# Patient Record
Sex: Female | Born: 1945
Health system: Southern US, Community
[De-identification: ages and names within clinical notes are randomized; demographics above are authoritative.]

## PROBLEM LIST (undated history)

## (undated) DIAGNOSIS — E1169 Type 2 diabetes mellitus with other specified complication: Secondary | ICD-10-CM

## (undated) DIAGNOSIS — I5189 Other ill-defined heart diseases: Secondary | ICD-10-CM

## (undated) DIAGNOSIS — I2510 Atherosclerotic heart disease of native coronary artery without angina pectoris: Secondary | ICD-10-CM

## (undated) DIAGNOSIS — E669 Obesity, unspecified: Secondary | ICD-10-CM

## (undated) DIAGNOSIS — J9611 Chronic respiratory failure with hypoxia: Secondary | ICD-10-CM

## (undated) DIAGNOSIS — G4730 Sleep apnea, unspecified: Secondary | ICD-10-CM

## (undated) DIAGNOSIS — K219 Gastro-esophageal reflux disease without esophagitis: Secondary | ICD-10-CM

## (undated) DIAGNOSIS — M47816 Spondylosis without myelopathy or radiculopathy, lumbar region: Secondary | ICD-10-CM

## (undated) DIAGNOSIS — I890 Lymphedema, not elsewhere classified: Secondary | ICD-10-CM

## (undated) DIAGNOSIS — E785 Hyperlipidemia, unspecified: Secondary | ICD-10-CM

## (undated) DIAGNOSIS — I471 Supraventricular tachycardia, unspecified: Secondary | ICD-10-CM

## (undated) DIAGNOSIS — E039 Hypothyroidism, unspecified: Secondary | ICD-10-CM

## (undated) DIAGNOSIS — M47817 Spondylosis without myelopathy or radiculopathy, lumbosacral region: Secondary | ICD-10-CM

## (undated) DIAGNOSIS — I10 Essential (primary) hypertension: Secondary | ICD-10-CM

## (undated) DIAGNOSIS — M1710 Unilateral primary osteoarthritis, unspecified knee: Secondary | ICD-10-CM

## (undated) HISTORY — DX: Chronic respiratory failure with hypoxia: J96.11

## (undated) HISTORY — DX: Obesity, unspecified: E66.9

## (undated) HISTORY — DX: Essential (primary) hypertension: I10

## (undated) HISTORY — DX: Spondylosis without myelopathy or radiculopathy, lumbosacral region: M47.817

## (undated) HISTORY — DX: Type 2 diabetes mellitus with other specified complication: E66.9

## (undated) HISTORY — DX: Hyperlipidemia, unspecified: E78.5

## (undated) HISTORY — DX: Other ill-defined heart diseases: I51.89

## (undated) HISTORY — DX: Supraventricular tachycardia: I47.1

## (undated) HISTORY — DX: Hypothyroidism, unspecified: E03.9

## (undated) HISTORY — DX: Atherosclerotic heart disease of native coronary artery without angina pectoris: I25.10

## (undated) HISTORY — DX: Spondylosis without myelopathy or radiculopathy, lumbar region: M47.816

## (undated) HISTORY — DX: Sleep apnea, unspecified: G47.30

## (undated) HISTORY — PX: CHOLECYSTECTOMY: SHX55

## (undated) HISTORY — DX: Unilateral primary osteoarthritis, unspecified knee: M17.10

## (undated) HISTORY — DX: Lymphedema, not elsewhere classified: I89.0

## (undated) HISTORY — DX: Supraventricular tachycardia, unspecified: I47.10

## (undated) HISTORY — DX: Obesity, unspecified: E11.69

## (undated) HISTORY — PX: TUBAL LIGATION: SHX77

## (undated) HISTORY — DX: Gastro-esophageal reflux disease without esophagitis: K21.9

## (undated) HISTORY — DX: Morbid (severe) obesity due to excess calories: E66.01

---

## 2000-08-24 ENCOUNTER — Emergency Department (HOSPITAL_COMMUNITY): Admission: EM | Admit: 2000-08-24 | Discharge: 2000-08-25 | Payer: Self-pay

## 2000-09-22 ENCOUNTER — Encounter: Payer: Self-pay | Admitting: Neurosurgery

## 2000-09-22 ENCOUNTER — Ambulatory Visit (HOSPITAL_COMMUNITY): Admission: RE | Admit: 2000-09-22 | Discharge: 2000-09-22 | Payer: Self-pay | Admitting: Neurosurgery

## 2003-02-25 ENCOUNTER — Emergency Department (HOSPITAL_COMMUNITY): Admission: EM | Admit: 2003-02-25 | Discharge: 2003-02-25 | Payer: Self-pay | Admitting: Emergency Medicine

## 2003-08-07 ENCOUNTER — Encounter: Payer: Self-pay | Admitting: *Deleted

## 2003-08-07 ENCOUNTER — Encounter: Admission: RE | Admit: 2003-08-07 | Discharge: 2003-08-07 | Payer: Self-pay | Admitting: Internal Medicine

## 2005-04-04 ENCOUNTER — Encounter: Admission: RE | Admit: 2005-04-04 | Discharge: 2005-04-04 | Payer: Self-pay | Admitting: Family Medicine

## 2005-04-21 ENCOUNTER — Other Ambulatory Visit: Admission: RE | Admit: 2005-04-21 | Discharge: 2005-04-21 | Payer: Self-pay | Admitting: Family Medicine

## 2005-12-14 ENCOUNTER — Encounter
Admission: RE | Admit: 2005-12-14 | Discharge: 2006-03-14 | Payer: Self-pay | Admitting: Physical Medicine and Rehabilitation

## 2006-01-16 ENCOUNTER — Ambulatory Visit: Payer: Self-pay | Admitting: Physical Medicine and Rehabilitation

## 2006-03-13 ENCOUNTER — Encounter
Admission: RE | Admit: 2006-03-13 | Discharge: 2006-04-11 | Payer: Self-pay | Admitting: Physical Medicine and Rehabilitation

## 2006-03-23 ENCOUNTER — Ambulatory Visit: Payer: Self-pay | Admitting: Physical Medicine and Rehabilitation

## 2006-03-23 ENCOUNTER — Encounter
Admission: RE | Admit: 2006-03-23 | Discharge: 2006-06-21 | Payer: Self-pay | Admitting: Physical Medicine and Rehabilitation

## 2006-04-12 ENCOUNTER — Encounter
Admission: RE | Admit: 2006-04-12 | Discharge: 2006-06-06 | Payer: Self-pay | Admitting: Physical Medicine and Rehabilitation

## 2006-05-18 ENCOUNTER — Ambulatory Visit: Payer: Self-pay | Admitting: Physical Medicine and Rehabilitation

## 2006-07-13 ENCOUNTER — Ambulatory Visit: Payer: Self-pay | Admitting: Physical Medicine and Rehabilitation

## 2006-07-13 ENCOUNTER — Encounter
Admission: RE | Admit: 2006-07-13 | Discharge: 2006-10-11 | Payer: Self-pay | Admitting: Physical Medicine and Rehabilitation

## 2006-09-12 ENCOUNTER — Ambulatory Visit: Payer: Self-pay | Admitting: Physical Medicine and Rehabilitation

## 2006-09-18 ENCOUNTER — Encounter
Admission: RE | Admit: 2006-09-18 | Discharge: 2006-10-31 | Payer: Self-pay | Admitting: Physical Medicine and Rehabilitation

## 2006-11-01 ENCOUNTER — Encounter
Admission: RE | Admit: 2006-11-01 | Discharge: 2006-11-20 | Payer: Self-pay | Admitting: Physical Medicine and Rehabilitation

## 2006-11-03 ENCOUNTER — Encounter
Admission: RE | Admit: 2006-11-03 | Discharge: 2007-02-01 | Payer: Self-pay | Admitting: Physical Medicine and Rehabilitation

## 2006-11-03 ENCOUNTER — Ambulatory Visit: Payer: Self-pay | Admitting: Physical Medicine and Rehabilitation

## 2006-11-10 ENCOUNTER — Encounter
Admission: RE | Admit: 2006-11-10 | Discharge: 2007-02-08 | Payer: Self-pay | Admitting: Physical Medicine & Rehabilitation

## 2006-11-10 ENCOUNTER — Encounter
Admission: RE | Admit: 2006-11-10 | Discharge: 2006-11-10 | Payer: Self-pay | Admitting: Physical Medicine and Rehabilitation

## 2006-11-13 ENCOUNTER — Ambulatory Visit: Payer: Self-pay | Admitting: Physical Medicine & Rehabilitation

## 2007-01-08 ENCOUNTER — Ambulatory Visit: Payer: Self-pay | Admitting: Physical Medicine and Rehabilitation

## 2007-01-31 ENCOUNTER — Encounter
Admission: RE | Admit: 2007-01-31 | Discharge: 2007-05-01 | Payer: Self-pay | Admitting: Physical Medicine and Rehabilitation

## 2007-03-02 ENCOUNTER — Ambulatory Visit: Payer: Self-pay | Admitting: Physical Medicine and Rehabilitation

## 2007-04-09 ENCOUNTER — Ambulatory Visit: Payer: Self-pay | Admitting: Physical Medicine and Rehabilitation

## 2007-05-02 ENCOUNTER — Encounter
Admission: RE | Admit: 2007-05-02 | Discharge: 2007-07-31 | Payer: Self-pay | Admitting: Physical Medicine and Rehabilitation

## 2007-06-06 ENCOUNTER — Ambulatory Visit: Payer: Self-pay | Admitting: Physical Medicine and Rehabilitation

## 2007-07-24 ENCOUNTER — Encounter
Admission: RE | Admit: 2007-07-24 | Discharge: 2007-09-07 | Payer: Self-pay | Admitting: Physical Medicine and Rehabilitation

## 2007-08-07 ENCOUNTER — Encounter
Admission: RE | Admit: 2007-08-07 | Discharge: 2007-11-05 | Payer: Self-pay | Admitting: Physical Medicine and Rehabilitation

## 2007-08-16 ENCOUNTER — Ambulatory Visit: Payer: Self-pay | Admitting: Physical Medicine and Rehabilitation

## 2007-10-12 ENCOUNTER — Ambulatory Visit: Payer: Self-pay | Admitting: Physical Medicine and Rehabilitation

## 2007-11-09 ENCOUNTER — Encounter
Admission: RE | Admit: 2007-11-09 | Discharge: 2008-02-07 | Payer: Self-pay | Admitting: Physical Medicine and Rehabilitation

## 2007-12-18 ENCOUNTER — Ambulatory Visit: Payer: Self-pay | Admitting: Physical Medicine and Rehabilitation

## 2008-01-18 LAB — CONVERTED CEMR LAB: Pap Smear: NORMAL

## 2008-02-07 ENCOUNTER — Encounter
Admission: RE | Admit: 2008-02-07 | Discharge: 2008-05-07 | Payer: Self-pay | Admitting: Physical Medicine and Rehabilitation

## 2008-02-20 ENCOUNTER — Ambulatory Visit: Payer: Self-pay | Admitting: Physical Medicine and Rehabilitation

## 2008-03-19 ENCOUNTER — Ambulatory Visit: Payer: Self-pay | Admitting: Physical Medicine and Rehabilitation

## 2008-04-16 ENCOUNTER — Ambulatory Visit: Payer: Self-pay | Admitting: Physical Medicine and Rehabilitation

## 2008-05-07 ENCOUNTER — Encounter
Admission: RE | Admit: 2008-05-07 | Discharge: 2008-05-14 | Payer: Self-pay | Admitting: Physical Medicine and Rehabilitation

## 2008-05-12 ENCOUNTER — Encounter: Admission: RE | Admit: 2008-05-12 | Discharge: 2008-05-12 | Payer: Self-pay | Admitting: Internal Medicine

## 2008-05-14 ENCOUNTER — Ambulatory Visit: Payer: Self-pay | Admitting: Physical Medicine and Rehabilitation

## 2008-07-04 ENCOUNTER — Ambulatory Visit: Payer: Self-pay | Admitting: Physical Medicine and Rehabilitation

## 2008-07-04 ENCOUNTER — Encounter
Admission: RE | Admit: 2008-07-04 | Discharge: 2008-07-04 | Payer: Self-pay | Admitting: Physical Medicine and Rehabilitation

## 2008-07-31 ENCOUNTER — Encounter
Admission: RE | Admit: 2008-07-31 | Discharge: 2008-10-29 | Payer: Self-pay | Admitting: Physical Medicine and Rehabilitation

## 2008-08-01 ENCOUNTER — Ambulatory Visit: Payer: Self-pay | Admitting: Physical Medicine and Rehabilitation

## 2008-09-02 ENCOUNTER — Ambulatory Visit: Payer: Self-pay | Admitting: Physical Medicine and Rehabilitation

## 2008-10-03 ENCOUNTER — Ambulatory Visit: Payer: Self-pay | Admitting: Physical Medicine and Rehabilitation

## 2008-11-03 ENCOUNTER — Encounter
Admission: RE | Admit: 2008-11-03 | Discharge: 2008-12-29 | Payer: Self-pay | Admitting: Physical Medicine and Rehabilitation

## 2008-11-04 ENCOUNTER — Ambulatory Visit: Payer: Self-pay | Admitting: Physical Medicine and Rehabilitation

## 2008-12-29 ENCOUNTER — Ambulatory Visit: Payer: Self-pay | Admitting: Physical Medicine and Rehabilitation

## 2009-01-29 ENCOUNTER — Encounter
Admission: RE | Admit: 2009-01-29 | Discharge: 2009-01-29 | Payer: Self-pay | Admitting: Physical Medicine & Rehabilitation

## 2009-02-04 ENCOUNTER — Ambulatory Visit: Payer: Self-pay | Admitting: Physical Medicine and Rehabilitation

## 2009-02-04 ENCOUNTER — Encounter
Admission: RE | Admit: 2009-02-04 | Discharge: 2009-05-05 | Payer: Self-pay | Admitting: Physical Medicine and Rehabilitation

## 2009-03-05 ENCOUNTER — Ambulatory Visit: Payer: Self-pay | Admitting: Physical Medicine and Rehabilitation

## 2009-04-07 ENCOUNTER — Ambulatory Visit: Payer: Self-pay | Admitting: Physical Medicine and Rehabilitation

## 2009-05-08 ENCOUNTER — Ambulatory Visit: Payer: Self-pay | Admitting: Physical Medicine and Rehabilitation

## 2009-05-08 ENCOUNTER — Encounter
Admission: RE | Admit: 2009-05-08 | Discharge: 2009-08-06 | Payer: Self-pay | Admitting: Physical Medicine and Rehabilitation

## 2009-06-05 ENCOUNTER — Ambulatory Visit: Payer: Self-pay | Admitting: Physical Medicine and Rehabilitation

## 2009-07-14 ENCOUNTER — Ambulatory Visit: Payer: Self-pay | Admitting: Physical Medicine and Rehabilitation

## 2009-08-12 ENCOUNTER — Ambulatory Visit: Payer: Self-pay | Admitting: Physical Medicine and Rehabilitation

## 2009-08-12 ENCOUNTER — Encounter
Admission: RE | Admit: 2009-08-12 | Discharge: 2009-08-12 | Payer: Self-pay | Admitting: Physical Medicine & Rehabilitation

## 2009-09-11 ENCOUNTER — Encounter
Admission: RE | Admit: 2009-09-11 | Discharge: 2009-10-22 | Payer: Self-pay | Admitting: Physical Medicine and Rehabilitation

## 2009-09-14 ENCOUNTER — Ambulatory Visit: Payer: Self-pay | Admitting: Physical Medicine and Rehabilitation

## 2009-10-12 ENCOUNTER — Ambulatory Visit: Payer: Self-pay | Admitting: Physical Medicine and Rehabilitation

## 2009-11-05 ENCOUNTER — Encounter
Admission: RE | Admit: 2009-11-05 | Discharge: 2010-02-03 | Payer: Self-pay | Admitting: Physical Medicine and Rehabilitation

## 2009-12-09 ENCOUNTER — Ambulatory Visit: Payer: Self-pay | Admitting: Physical Medicine and Rehabilitation

## 2010-01-06 ENCOUNTER — Ambulatory Visit: Payer: Self-pay | Admitting: Physical Medicine and Rehabilitation

## 2010-01-25 ENCOUNTER — Ambulatory Visit: Payer: Self-pay | Admitting: Family Medicine

## 2010-01-25 DIAGNOSIS — E785 Hyperlipidemia, unspecified: Secondary | ICD-10-CM

## 2010-01-25 DIAGNOSIS — M545 Low back pain: Secondary | ICD-10-CM

## 2010-01-25 DIAGNOSIS — E1149 Type 2 diabetes mellitus with other diabetic neurological complication: Secondary | ICD-10-CM

## 2010-01-25 DIAGNOSIS — I872 Venous insufficiency (chronic) (peripheral): Secondary | ICD-10-CM | POA: Insufficient documentation

## 2010-01-25 DIAGNOSIS — E1165 Type 2 diabetes mellitus with hyperglycemia: Secondary | ICD-10-CM

## 2010-01-25 DIAGNOSIS — K219 Gastro-esophageal reflux disease without esophagitis: Secondary | ICD-10-CM

## 2010-01-25 DIAGNOSIS — I10 Essential (primary) hypertension: Secondary | ICD-10-CM | POA: Insufficient documentation

## 2010-01-25 DIAGNOSIS — E559 Vitamin D deficiency, unspecified: Secondary | ICD-10-CM | POA: Insufficient documentation

## 2010-01-25 DIAGNOSIS — E039 Hypothyroidism, unspecified: Secondary | ICD-10-CM | POA: Insufficient documentation

## 2010-01-25 LAB — CONVERTED CEMR LAB: Vit D, 25-Hydroxy: 28 ng/mL — ABNORMAL LOW (ref 30–89)

## 2010-01-26 LAB — CONVERTED CEMR LAB
Hgb A1c MFr Bld: 7.7 % — ABNORMAL HIGH (ref 4.6–6.5)
TSH: 7.31 microintl units/mL — ABNORMAL HIGH (ref 0.35–5.50)

## 2010-02-01 ENCOUNTER — Encounter
Admission: RE | Admit: 2010-02-01 | Discharge: 2010-05-02 | Payer: Self-pay | Admitting: Physical Medicine and Rehabilitation

## 2010-02-02 ENCOUNTER — Encounter: Payer: Self-pay | Admitting: Family Medicine

## 2010-02-03 ENCOUNTER — Ambulatory Visit: Payer: Self-pay | Admitting: Physical Medicine and Rehabilitation

## 2010-02-03 ENCOUNTER — Telehealth (INDEPENDENT_AMBULATORY_CARE_PROVIDER_SITE_OTHER): Payer: Self-pay | Admitting: *Deleted

## 2010-02-05 ENCOUNTER — Ambulatory Visit: Payer: Self-pay | Admitting: Family Medicine

## 2010-02-08 LAB — CONVERTED CEMR LAB
ALT: 30 units/L (ref 0–35)
AST: 37 units/L (ref 0–37)
Hgb A1c MFr Bld: 7.8 % — ABNORMAL HIGH (ref 4.6–6.5)
Total Bilirubin: 0.4 mg/dL (ref 0.3–1.2)
Triglycerides: 250 mg/dL — ABNORMAL HIGH (ref 0.0–149.0)

## 2010-03-02 ENCOUNTER — Ambulatory Visit: Payer: Self-pay | Admitting: Gastroenterology

## 2010-03-03 ENCOUNTER — Ambulatory Visit: Payer: Self-pay | Admitting: Physical Medicine and Rehabilitation

## 2010-03-08 ENCOUNTER — Telehealth: Payer: Self-pay | Admitting: Family Medicine

## 2010-03-24 ENCOUNTER — Encounter (INDEPENDENT_AMBULATORY_CARE_PROVIDER_SITE_OTHER): Payer: Self-pay | Admitting: *Deleted

## 2010-03-31 ENCOUNTER — Ambulatory Visit: Payer: Self-pay | Admitting: Physical Medicine and Rehabilitation

## 2010-04-07 ENCOUNTER — Telehealth: Payer: Self-pay | Admitting: Family Medicine

## 2010-04-08 ENCOUNTER — Telehealth: Payer: Self-pay | Admitting: Family Medicine

## 2010-04-22 ENCOUNTER — Telehealth: Payer: Self-pay | Admitting: Family Medicine

## 2010-04-28 ENCOUNTER — Encounter (INDEPENDENT_AMBULATORY_CARE_PROVIDER_SITE_OTHER): Payer: Self-pay | Admitting: *Deleted

## 2010-04-28 ENCOUNTER — Ambulatory Visit: Payer: Self-pay | Admitting: Physical Medicine and Rehabilitation

## 2010-05-19 ENCOUNTER — Encounter
Admission: RE | Admit: 2010-05-19 | Discharge: 2010-08-17 | Payer: Self-pay | Admitting: Physical Medicine and Rehabilitation

## 2010-05-26 ENCOUNTER — Ambulatory Visit: Payer: Self-pay | Admitting: Physical Medicine and Rehabilitation

## 2010-06-23 ENCOUNTER — Ambulatory Visit: Payer: Self-pay | Admitting: Physical Medicine and Rehabilitation

## 2010-07-09 ENCOUNTER — Telehealth: Payer: Self-pay | Admitting: Family Medicine

## 2010-07-21 ENCOUNTER — Ambulatory Visit: Payer: Self-pay | Admitting: Physical Medicine and Rehabilitation

## 2010-08-18 ENCOUNTER — Encounter
Admission: RE | Admit: 2010-08-18 | Discharge: 2010-11-04 | Payer: Self-pay | Source: Home / Self Care | Attending: Physical Medicine and Rehabilitation | Admitting: Physical Medicine and Rehabilitation

## 2010-08-18 ENCOUNTER — Ambulatory Visit: Payer: Self-pay | Admitting: Physical Medicine and Rehabilitation

## 2010-08-30 ENCOUNTER — Ambulatory Visit: Payer: Self-pay | Admitting: Family Medicine

## 2010-08-31 LAB — CONVERTED CEMR LAB
Cholesterol: 183 mg/dL (ref 0–200)
Total CHOL/HDL Ratio: 3
Triglycerides: 294 mg/dL — ABNORMAL HIGH (ref 0.0–149.0)
VLDL: 58.8 mg/dL — ABNORMAL HIGH (ref 0.0–40.0)
Vit D, 25-Hydroxy: 37 ng/mL (ref 30–89)

## 2010-09-01 ENCOUNTER — Ambulatory Visit: Payer: Self-pay | Admitting: Family Medicine

## 2010-09-01 DIAGNOSIS — J209 Acute bronchitis, unspecified: Secondary | ICD-10-CM

## 2010-09-01 DIAGNOSIS — I890 Lymphedema, not elsewhere classified: Secondary | ICD-10-CM | POA: Insufficient documentation

## 2010-09-17 ENCOUNTER — Ambulatory Visit: Payer: Self-pay | Admitting: Physical Medicine and Rehabilitation

## 2010-09-29 ENCOUNTER — Telehealth: Payer: Self-pay | Admitting: Family Medicine

## 2010-10-12 ENCOUNTER — Ambulatory Visit: Payer: Self-pay | Admitting: Physical Medicine and Rehabilitation

## 2010-10-13 ENCOUNTER — Encounter: Payer: Self-pay | Admitting: Family Medicine

## 2010-10-29 ENCOUNTER — Encounter: Payer: Self-pay | Admitting: Family Medicine

## 2010-11-04 ENCOUNTER — Encounter
Admission: RE | Admit: 2010-11-04 | Discharge: 2010-11-30 | Payer: Self-pay | Source: Home / Self Care | Attending: Physical Medicine and Rehabilitation | Admitting: Physical Medicine and Rehabilitation

## 2010-11-08 ENCOUNTER — Other Ambulatory Visit: Payer: Self-pay | Admitting: Family Medicine

## 2010-11-08 ENCOUNTER — Ambulatory Visit
Admission: RE | Admit: 2010-11-08 | Discharge: 2010-11-08 | Payer: Self-pay | Source: Home / Self Care | Attending: Physical Medicine and Rehabilitation | Admitting: Physical Medicine and Rehabilitation

## 2010-11-08 ENCOUNTER — Ambulatory Visit
Admission: RE | Admit: 2010-11-08 | Discharge: 2010-11-08 | Payer: Self-pay | Source: Home / Self Care | Attending: Family Medicine | Admitting: Family Medicine

## 2010-11-08 LAB — HEMOGLOBIN A1C: Hgb A1c MFr Bld: 7.8 % — ABNORMAL HIGH (ref 4.6–6.5)

## 2010-11-09 ENCOUNTER — Ambulatory Visit
Admission: RE | Admit: 2010-11-09 | Discharge: 2010-11-09 | Payer: Self-pay | Source: Home / Self Care | Attending: Family Medicine | Admitting: Family Medicine

## 2010-11-15 ENCOUNTER — Telehealth: Payer: Self-pay | Admitting: Family Medicine

## 2010-11-21 ENCOUNTER — Encounter: Payer: Self-pay | Admitting: Family Medicine

## 2010-11-30 NOTE — Assessment & Plan Note (Signed)
Summary: NEW MEDICARE TO ESTABH PER DR Neenah Canter   Vital Signs:  Patient profile:   65 year old female Height:      63 inches Weight:      420 pounds BMI:     74.67 Temp:     98 degrees F oral Pulse rate:   84 / minute Pulse rhythm:   regular BP sitting:   138 / 80  (right arm) Cuff size:   regular (lower arm)  Vitals Entered By: Delilah Shan CMA Duncan Dull) (January 25, 2010 11:19 AM) CC: New Medicare Patient to Establish   History of Present Illness: 65 yo patient here to establish care.  Has been seeing Dr. Nehemiah Settle and Dr. Junie Spencer (pain clinic).  1.  Morbid obesity- BMI 74!  Pt has become completely sedentary, on disability for low back pain and chronic lymphedema.  Due to this lack of inactivity, she gains more and more weight every year.  She knows that her eating habits are poor as well, but she  has "nothing else to do but eat."  2.  Lymphedema- has been a chronic issue for her, she feels her legs are actually less swollen then they used to be.  Has weaned herself down to Lasix 40 mg daily instead of two times a day as taking it two times a day often causes her to become functionally incontinent.  Over last week, she has had area on her left lower leg that has been more red and warm.  No weeping.  No fevers, chills or other systemic symptoms.  Redness is not spreading.  3.  Low back pain- she reports disc problems (awaiting records).  See Dr. Junie Spencer at pain center.  Takes Hydrocodone/APAP 10-325 three times a day, Ibupfroen 800 mg three times a day, Cyclobenzabrine 5 mg three times a day, Gabapentin 300 mg three times a day, and Morphine sulfate 15 mg three times a day.  4.  DM- she is unsure when last a1c was checked.  Checks sugars once day, they have been runnin gin high 150s.  Denies any symptoms of hypogylcemia.  Takes Metfromin 500 mg two times a day and Glyburide 2.5 mg two times a day.  5.  Hypothyroidism- feels a little more lethargic lately.  Normally feels that when  thyroid is out of control.  currently taking Synthroid 150 mg daily.  6.  Well woman- never had Zostavax, pneumovax, colonscopy.  Due for mammogram.  Awaiting records for blood work (FLP, etc).  Preventive Screening-Counseling & Management  Alcohol-Tobacco     Smoking Status: never  Current Medications (verified): 1)  Simvastatin 40 Mg Tabs (Simvastatin) .... Take 1 Tablet By Mouth Once A Day 2)  Calcium Carbonate-Vitamin D 600-400 Mg-Unit  Tabs (Calcium Carbonate-Vitamin D) .Marland Kitchen.. 1200 Mg. Once Daily 3)  Synthroid 150 Mcg Tabs (Levothyroxine Sodium) .... Take 1 Tablet By Mouth Once A Day 4)  Furosemide 40 Mg Tabs (Furosemide) .... Take 1 Tablet By Mouth Two Times A Day 5)  Hydrocodone-Acetaminophen 10-325 Mg Tabs (Hydrocodone-Acetaminophen) .... Take 1 Tablet By Mouth Three Times A Day 6)  Ibuprofen 800 Mg Tabs (Ibuprofen) .... Take 1 Tablet By Mouth Two Times A Day To Three Times A Day 7)  Cyclobenzaprine Hcl 5 Mg Tabs (Cyclobenzaprine Hcl) .... Take 1 Tablet By Mouth Three Times A Day 8)  Klor-Con M20 20 Meq Cr-Tabs (Potassium Chloride Crys Cr) .... Take 1 Tablet By Mouth Once A Day 9)  Metformin Hcl 500 Mg Tabs (Metformin Hcl) .... Take  2 Tablets Two Times A Day 10)  Moexipril Hcl 15 Mg Tabs (Moexipril Hcl) .... Take 1 Tablet By Mouth Once A Day 11)  Gabapentin 300 Mg Caps (Gabapentin) .... Take 2 Capsules By Mouth Three Times A Day 12)  Glyburide 2.5 Mg Tabs (Glyburide) .... Take 1 Tablet By Mouth Two Times A Day 13)  Morphine Sulfate 15 Mg Tabs (Morphine Sulfate) .... Take 1 Tablet By Mouth Three Times A Day 14)  Aspirin 81 Mg  Tabs (Aspirin) .... Take 1 Tablet By Mouth Once A Day 15)  Venlafaxine Hcl 75 Mg Tabs (Venlafaxine Hcl) .... Take 3 Tablets By Mouth At Bedtime 16)  Keflex 500 Mg Caps (Cephalexin) .Marland Kitchen.. 1 Tab By Mouth Three Times A Day X 7 Days  Allergies (verified): 1)  ! Erythromycin  Past History:  Past Medical History: Hyperlipidemia Hypertension Low back  pain Morbid obesity Diabetes mellitus, type II Hypothyroidism GERD lymphedema  Past Surgical History: Cholecystectomy Tubal ligation  Family History: Two siblings died of MI- sister wass 69, brother 58. Mom died of Lung CA at 73  but had colon CA years prior.  Social History: LIves in [place].  On disability. Married, one adult child. Never Smoked Alcohol use-no Smoking Status:  never  Review of Systems      See HPI General:  Denies chills and fever. Eyes:  Denies blurring. ENT:  Denies difficulty swallowing. CV:  Denies chest pain or discomfort and difficulty breathing at night. GI:  Denies abdominal pain, bloody stools, and change in bowel habits. GU:  Denies abnormal vaginal bleeding and incontinence. MS:  Complains of joint pain and low back pain; denies joint redness, joint swelling, and loss of strength. Derm:  Complains of rash. Neuro:  Denies headaches. Psych:  Denies anxiety and depression. Endo:  Denies cold intolerance, excessive thirst, excessive urination, and heat intolerance. Heme:  Denies abnormal bruising.  Physical Exam  General:  morbidily obese, NAD. Eyes:  No corneal or conjunctival inflammation noted. EOMI. Perrla. Funduscopic exam benign, without hemorrhages, exudates or papilledema. Vision grossly normal. Ears:  R ear normal and L ear normal.   Nose:  no external deformity.   Mouth:  MMM Lungs:  distant breath sounds.no crackles and no wheezes.   Heart:  distant heart sounds. normal rate and regular rhythm.   Msk:  Walks slowly, wide gait, with cane Extremities:  Venous status, LE bilaterally 1+, moderate lymphedema bilaterally Neurologic:  alert & oriented X3.   Skin:  area of erythema on left lateral lower leg with statis dermatitis, no weaping, warm and tender to touch, no ulcerations. Psych:  Oriented X3, memory intact for recent and remote, and normally interactive.     Impression & Recommendations:  Problem # 1:  UNSPECIFIED  VENOUS INSUFFICIENCY (ICD-459.81) Assessment Deteriorated will place on Keflex for presumed cellulitis. Awaiting records, ?if she may benefit from [facility] boot and perhaps surgical consult for lymphedema.  Problem # 2:  LOW BACK PAIN (ICD-724.2) Assessment: Unchanged Followed by pain clinic.  She would like to be referred to a practice that is more handicap accessible. Her updated medication list for this problem includes:    Hydrocodone-acetaminophen 10-325 Mg Tabs (Hydrocodone-acetaminophen) .Marland Kitchen... Take 1 tablet by mouth three times a day    Ibuprofen 800 Mg Tabs (Ibuprofen) .Marland Kitchen... Take 1 tablet by mouth two times a day to three times a day    Cyclobenzaprine Hcl 5 Mg Tabs (Cyclobenzaprine hcl) .Marland Kitchen... Take 1 tablet by mouth three times a day  Morphine Sulfate 15 Mg Tabs (Morphine sulfate) .Marland Kitchen... Take 1 tablet by mouth three times a day    Aspirin 81 Mg Tabs (Aspirin) .Marland Kitchen... Take 1 tablet by mouth once a day  Problem # 3:  DIABETES MELLITUS, TYPE II (ICD-250.00) Assessment: Unchanged Check a1c today, continue current meds for now. Her updated medication list for this problem includes:    Metformin Hcl 500 Mg Tabs (Metformin hcl) .Marland Kitchen... Take 2 tablets two times a day    Moexipril Hcl 15 Mg Tabs (Moexipril hcl) .Marland Kitchen... Take 1 tablet by mouth once a day    Glyburide 2.5 Mg Tabs (Glyburide) .Marland Kitchen... Take 1 tablet by mouth two times a day    Aspirin 81 Mg Tabs (Aspirin) .Marland Kitchen... Take 1 tablet by mouth once a day  Problem # 4:  OBESITY, MORBID (ICD-278.01) Assessment: Deteriorated Very obese.  Discussed weight loss surgery, if she could qualify (ie not too large for surgery), it could truly save her life and eliminate most of her other medical problems.  Will refer for surgical consult.  Problem # 5:  UNSPECIFIED VITAMIN D DEFICIENCY (ICD-268.9) Assessment: Unchanged Check Vit D today. Orders: Venipuncture (63875) T-Vitamin D (25-Hydroxy) ([id number])  Problem # 6:  HYPOTHYROIDISM  (ICD-244.9) Assessment: Unchanged check TSH today.  Continue synthroid at current dose. Her updated medication list for this problem includes:    Synthroid 150 Mcg Tabs (Levothyroxine sodium) .Marland Kitchen... Take 1 tablet by mouth once a day  Orders: Venipuncture (41660) TLB-TSH (Thyroid Stimulating Hormone) (84443-TSH)  Problem # 7:  Preventive Health Care (ICD-V70.0) Assessment: Comment Only set up colonoscopy, mammogram today. Pneumovax today given her chronic medical problems. Add her to Zostavax wait list. Await records.  Complete Medication List: 1)  Simvastatin 40 Mg Tabs (Simvastatin) .... Take 1 tablet by mouth once a day 2)  Calcium Carbonate-vitamin D 600-400 Mg-unit Tabs (Calcium carbonate-vitamin d) .Marland Kitchen.. 1200 mg. once daily 3)  Synthroid 150 Mcg Tabs (Levothyroxine sodium) .... Take 1 tablet by mouth once a day 4)  Furosemide 40 Mg Tabs (Furosemide) .... Take 1 tablet by mouth two times a day 5)  Hydrocodone-acetaminophen 10-325 Mg Tabs (Hydrocodone-acetaminophen) .... Take 1 tablet by mouth three times a day 6)  Ibuprofen 800 Mg Tabs (Ibuprofen) .... Take 1 tablet by mouth two times a day to three times a day 7)  Cyclobenzaprine Hcl 5 Mg Tabs (Cyclobenzaprine hcl) .... Take 1 tablet by mouth three times a day 8)  Klor-con M20 20 Meq Cr-tabs (Potassium chloride crys cr) .... Take 1 tablet by mouth once a day 9)  Metformin Hcl 500 Mg Tabs (Metformin hcl) .... Take 2 tablets two times a day 10)  Moexipril Hcl 15 Mg Tabs (Moexipril hcl) .... Take 1 tablet by mouth once a day 11)  Gabapentin 300 Mg Caps (Gabapentin) .... Take 2 capsules by mouth three times a day 12)  Glyburide 2.5 Mg Tabs (Glyburide) .... Take 1 tablet by mouth two times a day 13)  Morphine Sulfate 15 Mg Tabs (Morphine sulfate) .... Take 1 tablet by mouth three times a day 14)  Aspirin 81 Mg Tabs (Aspirin) .... Take 1 tablet by mouth once a day 15)  Venlafaxine Hcl 75 Mg Tabs (Venlafaxine hcl) .... Take 3 tablets by  mouth at bedtime 16)  Keflex 500 Mg Caps (Cephalexin) .Marland Kitchen.. 1 tab by mouth three times a day x 7 days  Other Orders: Radiology Referral (Radiology) Gastroenterology Referral (GI) Surgical Referral (Surgery) Pain Clinic Referral (Pain) TLB-A1C / Hgb A1C (Glycohemoglobin) (  [address]109-U0A)  Patient Instructions: 1)  Nice to meet you, Ms Booton. 2)  Call me in a month to see if we have received your medical records. 3)  Please stop by to see Shirlee Limerick on your way out. Prescriptions: GLYBURIDE 2.5 MG TABS (GLYBURIDE) Take 1 tablet by mouth two times a day  #60 x 6   Entered and Authorized by:   Ruthe Mannan MD   Signed by:   Ruthe Mannan MD on 01/25/2010   Method used:   Electronically to        [facility]* (retail)       [address]       [place], [place]  [address]       Ph: [phone]       Fax: [phone]   RxID:   [id number] METFORMIN HCL 500 MG TABS (METFORMIN HCL) Take 2 tablets two times a day  #60 x 6   Entered and Authorized by:   Ruthe Mannan MD   Signed by:   Ruthe Mannan MD on 01/25/2010   Method used:   Electronically to        [facility]* (retail)       [address]       [place], [place]  [address]       Ph: [phone]       Fax: [phone]   RxID:   [id number] KEFLEX 500 MG CAPS (CEPHALEXIN) 1 tab by mouth three times a day x 7 days  #21 x 0   Entered and Authorized by:   Ruthe Mannan MD   Signed by:   Ruthe Mannan MD on 01/25/2010   Method used:   Electronically to        [facility]* (retail)       [address]       [place], [place]  [address]       Ph: [phone]       Fax: [phone]   RxID:   [id number]   Current Allergies (reviewed today): ! ERYTHROMYCIN  PAP Result Date:  01/18/2008 PAP Result:  normal PAP Next Due:  3 yr   Appended Document: Orders Update    Clinical Lists Changes  Orders: Added new Service order of Specimen Handling ([address]) - Signed      Appended Document: NEW MEDICARE TO ESTABH PER DR  Dayton Martes   Immunizations Administered:  Pneumonia Vaccine:    Vaccine Type: Pneumovax (Medicare)    Site: right deltoid    Mfr: Merck    Dose: 0.5 ml    Route: IM    Given by: Delilah Shan CMA (AAMA)    Exp. Date: 06/14/2011    Lot #: [id number]    VIS given: 05/28/96 version given January 25, 2010.

## 2010-11-30 NOTE — Miscellaneous (Signed)
Summary: prevention  Clinical Lists Changes  Observations: Added new observation of FLUVAXDUE: 09/18/2010 (02/02/2010 8:04) Added new observation of PNEUMVXNEXT: None (02/02/2010 8:04) Added new observation of HGBA1CNXTDUE: 04/27/2010 (02/02/2010 8:04) Added new observation of FLU VAX: historical (09/18/2009 8:04)     Flu Vaccine Result Date:  09/18/2009 Flu Vaccine Result:  historical Flu Vaccine Next Due:  1 yr Diabetic Eye Exam Date:  01/07/2009

## 2010-11-30 NOTE — Progress Notes (Signed)
----   Converted from flag ---- ---- 02/02/2010 8:08 AM, Delilah Shan CMA (AAMA) wrote: Lab appointment scheduled:  02/05/2010 at 8:15 a.m. (fasting)  ---- 02/02/2010 8:08 AM, Ruthe Mannan MD wrote: Please let pt know we received her old records and she is due for fasting labs- cholesterol, hepatic panel (272.4), Vit D (268.9), alc (250.0) ------------------------------

## 2010-11-30 NOTE — Miscellaneous (Signed)
Summary: [place] previsit  Clinical Lists Changes     Pt came into office for a previsit today. Due to weight restrictions (over 400lbs) and being seen by another GI doctor [facility])  within a year , the PV and colonoscopy were cancelled. Pt understood .She will call [facility] to schedule an appt.

## 2010-11-30 NOTE — Progress Notes (Signed)
Summary: form for wheelchair repair  Phone Note Other Incoming   Caller: Pensions consultant of Call: Wheel chair company cant accept the order for wheel chair repair that was sent to them per pt's request.  They have faxed a form that needs to be signed and faxed.   Form is on your desk. Initial call taken by: Lowella Petties CMA,  April 08, 2010 10:17 AM  Follow-up for Phone Call        in my box. Ruthe Mannan MD  April 08, 2010 10:35 AM  Form faxed.  Follow-up by: Linde Gillis CMA Duncan Dull),  April 08, 2010 10:52 AM

## 2010-11-30 NOTE — Letter (Signed)
Summary: [place] No Show Letter  [place] [facility]  [address] [place], [place] [address]   Phone: [phone]  Fax: [phone]    03/24/2010 MRN: [id number]  Theresa Huffman [address] [place], [place]  [address]   Dear [name],   Our records indicate that you missed your scheduled appointment for your Mammogram appointment at [facility] .  Please contact this office to reschedule your appointment as soon as possible.  It is important that you keep your scheduled appointments, so we can provide you the best care possible.      Sincerely,   [place] at [facility]

## 2010-11-30 NOTE — Letter (Signed)
Summary: [place] No Show Letter  [place] [facility] [place]  [address]   [place], [place] [address]   Phone: [phone]  Fax: [phone]    04/28/2010 MRN: [id number]  Lois Larivee [address] [place], [place]  [address]   Dear Ms. Barrick,   Our records indicate that you missed your scheduled appointment with __lab___________________ on __6.29.11__________.  Please contact this office to reschedule your appointment as soon as possible.  It is important that you keep your scheduled appointments with your physician, so we can provide you the best care possible.  Please be advised that there may be a charge for "no show" appointments.    Sincerely,   [place] at [place] 

## 2010-11-30 NOTE — Progress Notes (Signed)
Summary: needs order for motorized chair  Phone Note Call from Patient Call back at Home Phone [phone]   Caller: Patient Call For: Ruthe Mannan MD Summary of Call: Pt needs an order for a motorized recliner/ lift chair.  She says she discussed this with you at office visit.  She is ok to wait till next week when you are back in the office. Initial call taken by: Lowella Petties CMA, AAMA,  September 29, 2010 3:11 PM  Follow-up for Phone Call        In my box. Ruthe Mannan MD  October 04, 2010 9:35 AM  Advised pt order is ready for pick up. Follow-up by: Lowella Petties CMA, AAMA,  October 04, 2010 10:11 AM

## 2010-11-30 NOTE — Progress Notes (Signed)
Summary: Patient canc referral appt.  Phone Note Other Incoming   Caller: Carin Primrose Summary of Call: Patient cancelled the following appt.: Colonoscopy set up with Dr Christella Hartigan on 03/15/2010 at 8:00am. Carlton Adam  January 25, 2010 12:20 PM  IN IDX PT CANCELED APPT Carrie Mew  April 16, 2010 12:15 PM Initial call taken by: Carin Primrose,  July 09, 2010 4:11 PM

## 2010-11-30 NOTE — Letter (Signed)
Summary: [place] No Show Letter  [place] at [facility]  [address] [place], [place] [address]   Phone: [phone]  Fax: [phone]    04/28/2010 MRN: [id number]  QUENNA DOEPKE [address] [place], [place]  [address]   Dear Ms. Laural Benes,   Our records indicate that you missed your scheduled appointment with __lab___________________ on __6.29.11__________.  Please contact this office to reschedule your appointment as soon as possible.  It is important that you keep your scheduled appointments with your physician, so we can provide you the best care possible.  Please be advised that there may be a charge for "no show" appointments.    Sincerely,   [place] at [facility]

## 2010-11-30 NOTE — Letter (Signed)
Summary: Records Dated 02-07-08 thru 01-06-10/[facility]  Records Dated 02-07-08 thru 01-06-10/[facility]   Imported By: Lanelle Bal 02/04/2010 13:14:54  _____________________________________________________________________  External Attachment:    Type:   Image     Comment:   External Document

## 2010-11-30 NOTE — Assessment & Plan Note (Signed)
Summary: F/U ON LAB AND LEG PROBLEMS/DLO   Vital Signs:  Patient profile:   65 year old female Height:      63 inches Weight:      418.75 pounds BMI:     74.45 O2 Sat:      94 % on Room air Temp:     98.2 degrees F oral Pulse rate:   140 / minute Pulse rhythm:   regular BP sitting:   140 / 82  (left arm) Cuff size:   large  Vitals Entered By: Linde Gillis CMA Duncan Dull) (September 01, 2010 12:05 PM)  O2 Flow:  Room air CC: follow up on labs, leg problems   History of Present Illness: 65 yo patient here to follow up DM, leg pain.     1.  Lymphedema- has been a chronic issue for her, she feels her legs are actually less swollen then they used to be.  No red or weeping areas.  It does become painful and embarrassing and she would like to know te treatment options.    2.  DM- DM remains poorly controlled. Recent a1c 8.0 (was 7.8 prior to that and 7.7 when we first checked it in July).  Checks sugars once day, they have been runnin gin high 150s- mid 200s.  Denies any symptoms of hypogylcemia.  Takes Metfromin 500 mg two times a day and Glyburide 2.5 mg two times a day.  3.  HLD- TG elevated, otherwise lipid panel is great- LDL 85.1, HDL 57.60. On simvasatin 40 mg daily.  4.  Hypothyroidism- TSH stable at 4.66.  Denies any symptoms of hypo or hyperthyroidism.  On Synthroid 150 micrograms daily.  5.  Wheezing- past month, increased cough, DOE and wheezing.  No fevers or chills.  Did have a runny nose when this started.    Current Medications (verified): 1)  Simvastatin 40 Mg Tabs (Simvastatin) .... Take 1 Tablet By Mouth Once A Day 2)  Calcium Carbonate-Vitamin D 600-400 Mg-Unit  Tabs (Calcium Carbonate-Vitamin D) .Marland Kitchen.. 1200 Mg. Once Daily 3)  Synthroid 175 Mcg Tabs (Levothyroxine Sodium) .Marland Kitchen.. 1 Tab By Mouth Daily. 4)  Furosemide 40 Mg Tabs (Furosemide) .... Take 1 Tablet By Mouth Two Times A Day 5)  Hydrocodone-Acetaminophen 10-325 Mg Tabs (Hydrocodone-Acetaminophen) .... Take 1 Tablet  By Mouth Three Times A Day 6)  Ibuprofen 800 Mg Tabs (Ibuprofen) .... Take 1 Tablet By Mouth Two Times A Day To Three Times A Day 7)  Cyclobenzaprine Hcl 5 Mg Tabs (Cyclobenzaprine Hcl) .... Take 1 Tablet By Mouth Three Times A Day 8)  Klor-Con M20 20 Meq Cr-Tabs (Potassium Chloride Crys Cr) .... Take 1 Tablet By Mouth Once A Day 9)  Metformin Hcl 500 Mg Tabs (Metformin Hcl) .... Take 2 Tablets Two Times A Day 10)  Moexipril Hcl 15 Mg Tabs (Moexipril Hcl) .... Take 1 Tablet By Mouth Once A Day 11)  Gabapentin 300 Mg Caps (Gabapentin) .... Take 2 Capsules By Mouth Three Times A Day 12)  Glyburide 2.5 Mg Tabs (Glyburide) .... Take 2  Tablet By Mouth Two Times A Day 13)  Morphine Sulfate 15 Mg Tabs (Morphine Sulfate) .... Take 1 Tablet By Mouth Three Times A Day 14)  Aspirin 81 Mg  Tabs (Aspirin) .... Take 1 Tablet By Mouth Once A Day 15)  Venlafaxine Hcl 75 Mg Tabs (Venlafaxine Hcl) .... Take 3 Tablets By Mouth At Bedtime 16)  Keflex 500 Mg Caps (Cephalexin) .Marland Kitchen.. 1 Tab By Mouth Three Times A  Day X 7 Days 17)  Januvia 100 Mg Tabs (Sitagliptin Phosphate) .... Take 1 Tab By Mouth Daily 18)  Azithromycin 250 Mg  Tabs (Azithromycin) .... 2 By  Mouth Today and Then 1 Daily For 4 Days 19)  Proair Hfa 108 (90 Base) Mcg/act  Aers (Albuterol Sulfate) .... 2 Inh Q4h As Needed Shortness of Breath  Allergies: 1)  ! Erythromycin  Past History:  Past Medical History: Last updated: 17-Feb-2010 Hyperlipidemia Hypertension Low back pain Morbid obesity Diabetes mellitus, type II Hypothyroidism GERD lymphedema  Past Surgical History: Last updated: 17-Feb-2010 Cholecystectomy Tubal ligation  Family History: Last updated: 2010/02/17 Two siblings died of MI- sister wass 36, brother 70. Mom died of Lung CA at 23  but had colon CA years prior.  Social History: Last updated: 2010-02-17 [facility] in [place].  On disability. Married, one adult child. Never Smoked Alcohol use-no  Risk  Factors: Smoking Status: never (02/17/2010)  Review of Systems      See HPI General:  Denies chills and fever. Resp:  Complains of cough, shortness of breath, and wheezing; denies sputum productive. GI:  Denies abdominal pain, nausea, and vomiting. Endo:  Denies cold intolerance and heat intolerance.  Physical Exam  General:  morbidily obese, NAD. Eyes:  No corneal or conjunctival inflammation noted. EOMI. Perrla. Funduscopic exam benign, without hemorrhages, exudates or papilledema. Vision grossly normal. Ears:  R ear normal and L ear normal.   Nose:  External nasal examination shows no deformity or inflammation. Nasal mucosa are pink and moist without lesions or exudates. Mouth:  MMM Lungs:  distant breath sounds. exp wheezes at RLL base.  No crackles or rhonchi. Heart:  distant heart sounds. normal rate and regular rhythm.   Msk:  Walks slowly, wide gait, with cane Extremities:  Venous status, LE bilaterally 1+, moderate lymphedema bilaterally Neurologic:  alert & oriented X3.   Psych:  Oriented X3, memory intact for recent and remote, and normally interactive.     Impression & Recommendations:  Problem # 1:  HYPERTENSION (ICD-401.9) Assessment Deteriorated mildly elevated today but had to walk across clinic to get weighed, SOB that resolved with time.  She keeps a log at home and has been ok.   Her updated medication list for this problem includes:    Furosemide 40 Mg Tabs (Furosemide) .Marland Kitchen... Take 1 tablet by mouth two times a day    Moexipril Hcl 15 Mg Tabs (Moexipril hcl) .Marland Kitchen... Take 1 tablet by mouth once a day  Problem # 2:  DIABETES MELLITUS, TYPE II (ICD-250.00) Assessment: Deteriorated Discussed treatment options.  She would like to try Januvia rather than starting insulin.  Follow up a1c in 3 months.  See pt instructions for further details. Her updated medication list for this problem includes:    Metformin Hcl 500 Mg Tabs (Metformin hcl) .Marland Kitchen... Take 2 tablets two  times a day    Moexipril Hcl 15 Mg Tabs (Moexipril hcl) .Marland Kitchen... Take 1 tablet by mouth once a day    Glyburide 2.5 Mg Tabs (Glyburide) .Marland Kitchen... Take 2  tablet by mouth two times a day    Aspirin 81 Mg Tabs (Aspirin) .Marland Kitchen... Take 1 tablet by mouth once a day    Januvia 100 Mg Tabs (Sitagliptin phosphate) .Marland Kitchen... Take 1 tab by mouth daily  Problem # 3:  ACUTE BRONCHITIS (ICD-466.0) Assessment: New Given duration of symtpoms along with her weight and sedentary lifestyle, will treat for bacterial bronchitis with Zpack (reports she can take this eventhough had a  prior adverse rx to erythromycin).  Will also start as needed proair. Her updated medication list for this problem includes:    Keflex 500 Mg Caps (Cephalexin) .Marland Kitchen... 1 tab by mouth three times a day x 7 days    Azithromycin 250 Mg Tabs (Azithromycin) .Marland Kitchen... 2 by  mouth today and then 1 daily for 4 days    Proair Hfa 108 (90 Base) Mcg/act Aers (Albuterol sulfate) .Marland Kitchen... 2 inh q4h as needed shortness of breath  Problem # 4:  LYMPHEDEMA, SEVERE (ICD-457.1) Assessment: Unchanged Discussed treamtnet options, including pressure stockings and surgery.  She has tried compression hose in past but difficult to get on due to body habitius.  We discussed wrapping with ACE bandage or anytype of pressure wrap.  She does not want to pursue surgery.    Problem # 5:  UNSPECIFIED VITAMIN D DEFICIENCY (ICD-268.9) Assessment: Unchanged Stable.  Continue current dose of Synthroid.  Complete Medication List: 1)  Simvastatin 40 Mg Tabs (Simvastatin) .... Take 1 tablet by mouth once a day 2)  Calcium Carbonate-vitamin D 600-400 Mg-unit Tabs (Calcium carbonate-vitamin d) .Marland Kitchen.. 1200 mg. once daily 3)  Synthroid 175 Mcg Tabs (Levothyroxine sodium) .Marland Kitchen.. 1 tab by mouth daily. 4)  Furosemide 40 Mg Tabs (Furosemide) .... Take 1 tablet by mouth two times a day 5)  Hydrocodone-acetaminophen 10-325 Mg Tabs (Hydrocodone-acetaminophen) .... Take 1 tablet by mouth three times a  day 6)  Ibuprofen 800 Mg Tabs (Ibuprofen) .... Take 1 tablet by mouth two times a day to three times a day 7)  Cyclobenzaprine Hcl 5 Mg Tabs (Cyclobenzaprine hcl) .... Take 1 tablet by mouth three times a day 8)  Klor-con M20 20 Meq Cr-tabs (Potassium chloride crys cr) .... Take 1 tablet by mouth once a day 9)  Metformin Hcl 500 Mg Tabs (Metformin hcl) .... Take 2 tablets two times a day 10)  Moexipril Hcl 15 Mg Tabs (Moexipril hcl) .... Take 1 tablet by mouth once a day 11)  Gabapentin 300 Mg Caps (Gabapentin) .... Take 2 capsules by mouth three times a day 12)  Glyburide 2.5 Mg Tabs (Glyburide) .... Take 2  tablet by mouth two times a day 13)  Morphine Sulfate 15 Mg Tabs (Morphine sulfate) .... Take 1 tablet by mouth three times a day 14)  Aspirin 81 Mg Tabs (Aspirin) .... Take 1 tablet by mouth once a day 15)  Venlafaxine Hcl 75 Mg Tabs (Venlafaxine hcl) .... Take 3 tablets by mouth at bedtime 16)  Keflex 500 Mg Caps (Cephalexin) .Marland Kitchen.. 1 tab by mouth three times a day x 7 days 17)  Januvia 100 Mg Tabs (Sitagliptin phosphate) .... Take 1 tab by mouth daily 18)  Azithromycin 250 Mg Tabs (Azithromycin) .... 2 by  mouth today and then 1 daily for 4 days 19)  Proair Hfa 108 (90 Base) Mcg/act Aers (Albuterol sulfate) .... 2 inh q4h as needed shortness of breath  Other Orders: Flu Vaccine 26yrs + MEDICARE PATIENTS (Q2595) Administration Flu vaccine - MCR (G3875)  Patient Instructions: 1)  please make an appointment for follow up lab visit- a1c (250.00) 2)  We adding Januvia, so please check blood sugars three times a day. 3)  call me if you feel like sugars are too low. Prescriptions: PROAIR HFA 108 (90 BASE) MCG/ACT  AERS (ALBUTEROL SULFATE) 2 inh q4h as needed shortness of breath  #1 x 0   Entered and Authorized by:   Ruthe Mannan MD   Signed by:   Ruthe Mannan  MD on 09/01/2010   Method used:   Electronically to        [facility]* (retail)       [address]       [place], [place]   [address]       Ph: [phone]       Fax: [phone]   RxID:   [id number] AZITHROMYCIN 250 MG  TABS (AZITHROMYCIN) 2 by  mouth today and then 1 daily for 4 days  #6 x 0   Entered and Authorized by:   Ruthe Mannan MD   Signed by:   Ruthe Mannan MD on 09/01/2010   Method used:   Electronically to        [facility]* (retail)       [address]       [place], [place]  [address]       Ph: [phone]       Fax: [phone]   RxID:   [id number] JANUVIA 100 MG TABS (SITAGLIPTIN PHOSPHATE) Take 1 tab by mouth daily  #90 x 3   Entered and Authorized by:   Ruthe Mannan MD   Signed by:   Ruthe Mannan MD on 09/01/2010   Method used:   Electronically to        [facility]* (retail)       [address]       [place], [place]  [address]       Ph: [phone]       Fax: [phone]   RxID:   [id number]    Orders Added: 1)  Flu Vaccine 68yrs + MEDICARE PATIENTS [Q2039] 2)  Administration Flu vaccine - MCR [G0008] 3)  Est. Patient Level IV [82993]    Current Allergies (reviewed today): ! ERYTHROMYCIN      Flu Vaccine Consent Questions     Do you have a history of severe allergic reactions to this vaccine? no    Any prior history of allergic reactions to egg and/or gelatin? no    Do you have a sensitivity to the preservative Thimersol? no    Do you have a past history of Guillan-Barre Syndrome? no    Do you currently have an acute febrile illness? no    Have you ever had a severe reaction to latex? no    Vaccine information given and explained to patient? yes    Are you currently pregnant? no    Lot Number:[id number]   Exp Date:04/30/2011   Site Given  Left Deltoid IM1

## 2010-11-30 NOTE — Progress Notes (Signed)
Summary: refill request for gabapentin  Phone Note Refill Request Message from:  Fax from Pharmacy  Refills Requested: Medication #1:  GABAPENTIN 300 MG CAPS Take 2 capsules by mouth three times a day   Last Refilled: 01/06/2010 Faxed request from [facility], [phone].  Initial call taken by: Lowella Petties CMA,  Mar 08, 2010 8:18 AM    Prescriptions: GABAPENTIN 300 MG CAPS (GABAPENTIN) Take 2 capsules by mouth three times a day  #180 x 3   Entered and Authorized by:   Ruthe Mannan MD   Signed by:   Ruthe Mannan MD on 03/08/2010   Method used:   Electronically to        [facility]* (retail)       [address]       [place], [place]  [address]       Ph: [phone]       Fax: [phone]   RxID:   [id number]

## 2010-11-30 NOTE — Progress Notes (Signed)
Summary: NO SHOW FOR SCREEMING MAMMOGRAM  Phone Note Outgoing Call   Summary of Call: NO SHOW appt for Mammogram.... Called pt to resch no response. Sent ltr regarding appt no response. Called pt today, no response...fyi to physician.Daine Gip  April 22, 2010 4:37 PM  Initial call taken by: Daine Gip,  April 22, 2010 4:37 PM

## 2010-11-30 NOTE — Progress Notes (Signed)
Summary: needs order for wheelchair repair  Phone Note Call from Patient Call back at Home Phone [phone]   Caller: Patient Summary of Call: Pt took her wheelchair to [facility] in [place] for repair and they need a doctor's order to do the repair.  Phone number is [phone].  Pt says specific repair, which is to put in a joy stick, doesnt have to be mentioned, just order for repair.  She didnt have fax number so we will have to call repair shop to get their fax number. Initial call taken by: Lowella Petties CMA,  April 07, 2010 3:13 PM  Follow-up for Phone Call        order on my desk. Ruthe Mannan MD  April 07, 2010 3:16 PM  Called and got fax number [phone], order faxed. Follow-up by: Linde Gillis CMA Duncan Dull),  April 07, 2010 3:29 PM

## 2010-12-02 NOTE — Medication Information (Signed)
Summary: [facility]  [facility]   Imported By: Lanelle Bal 11/05/2010 13:51:07  _____________________________________________________________________  External Attachment:    Type:   Image     Comment:   External Document

## 2010-12-02 NOTE — Progress Notes (Addendum)
Summary: refill request for flexeril  Phone Note Refill Request Message from:  Fax from Pharmacy  Refills Requested: Medication #1:  CYCLOBENZAPRINE HCL 5 MG TABS Take 1 tablet by mouth three times a day   Last Refilled: 09/16/2010 Faxed request from [facility], [phone].   Initial call taken by: Lowella Petties CMA, AAMA,  November 15, 2010 2:29 PM    Prescriptions: CYCLOBENZAPRINE HCL 5 MG TABS (CYCLOBENZAPRINE HCL) Take 1 tablet by mouth three times a day  #30 x 0   Entered and Authorized by:   Ruthe Mannan MD   Signed by:   Ruthe Mannan MD on 11/16/2010   Method used:   Electronically to        [facility]* (retail)       [address]       [place], [place]  [address]       Ph: [phone]       Fax: [phone]   RxID:   [id number]   Appended Document: refill request for flexeril Patient says that she takes this 3 times a day and there were only 30 called in. Patient is asking if she could get new rx called [facility].

## 2010-12-02 NOTE — Assessment & Plan Note (Signed)
Summary: DISCUSS LAB RESULTS/ lb   Vital Signs:  Patient profile:   65 year old female Height:      63 inches Weight:      399.50 pounds BMI:     71.02 Temp:     98.6 degrees F oral Pulse rate:   122 / minute Pulse rhythm:   regular BP sitting:   112 / 64  (left arm) Cuff size:   large  Vitals Entered By: Linde Gillis CMA Duncan Dull) (November 09, 2010 12:26 PM) CC: discuss lab results   History of Present Illness: 65 yo patient here to follow up DM,.   DM- DM remains poorly controlled. Recent a1c 7.8 (was 8.0, 7.8 prior to that and 7.7 when we first checked it in July).  Checks sugars once day, they have been running a bit better than they were last time we checkked.  100s- 150s.  Denies any symptoms of hypogylcemia.  Takes Metfromin 500 mg two times a day and Glyburide 2.5 mg two times a day.  Added Januvia 100 mg daily in November.  She knows she can eat better.  Admits to eating sweets late at night.  Eats for emotionally reasons, per pt.  Has been thinking seriously lately about her weight and her eating habits.  Current Medications (verified): 1)  Simvastatin 40 Mg Tabs (Simvastatin) .... Take 1 Tablet By Mouth Once A Day 2)  Calcium Carbonate-Vitamin D 600-400 Mg-Unit  Tabs (Calcium Carbonate-Vitamin D) .Marland Kitchen.. 1200 Mg. Once Daily 3)  Synthroid 175 Mcg Tabs (Levothyroxine Sodium) .Marland Kitchen.. 1 Tab By Mouth Daily. 4)  Furosemide 40 Mg Tabs (Furosemide) .... Take 1 Tablet By Mouth Two Times A Day 5)  Hydrocodone-Acetaminophen 10-325 Mg Tabs (Hydrocodone-Acetaminophen) .... Take 1 Tablet By Mouth Three Times A Day 6)  Ibuprofen 800 Mg Tabs (Ibuprofen) .... Take 1 Tablet By Mouth Two Times A Day To Three Times A Day 7)  Cyclobenzaprine Hcl 5 Mg Tabs (Cyclobenzaprine Hcl) .... Take 1 Tablet By Mouth Three Times A Day 8)  Klor-Con M20 20 Meq Cr-Tabs (Potassium Chloride Crys Cr) .... Take 1 Tablet By Mouth Once A Day 9)  Metformin Hcl 500 Mg Tabs (Metformin Hcl) .... Take 2 Tablets Two Times A  Day 10)  Moexipril Hcl 15 Mg Tabs (Moexipril Hcl) .... Take 1 Tablet By Mouth Once A Day 11)  Gabapentin 300 Mg Caps (Gabapentin) .... Take 2 Capsules By Mouth Three Times A Day 12)  Glyburide 2.5 Mg Tabs (Glyburide) .... Take 2  Tablet By Mouth Two Times A Day 13)  Morphine Sulfate 15 Mg Tabs (Morphine Sulfate) .... Take 1 Tablet By Mouth Three Times A Day 14)  Aspirin 81 Mg  Tabs (Aspirin) .... Take 1 Tablet By Mouth Once A Day 15)  Venlafaxine Hcl 75 Mg Tabs (Venlafaxine Hcl) .... Take 3 Tablets By Mouth At Bedtime 16)  Januvia 100 Mg Tabs (Sitagliptin Phosphate) .... Take 1 Tab By Mouth Daily 17)  Proair Hfa 108 (90 Base) Mcg/act  Aers (Albuterol Sulfate) .... 2 Inh Q4h As Needed Shortness of Breath  Allergies: 1)  ! Erythromycin  Past History:  Past Medical History: Last updated: 02/11/2010 Hyperlipidemia Hypertension Low back pain Morbid obesity Diabetes mellitus, type II Hypothyroidism GERD lymphedema  Past Surgical History: Last updated: February 11, 2010 Cholecystectomy Tubal ligation  Family History: Last updated: Feb 11, 2010 Two siblings died of MI- sister wass 53, brother 31. Mom died of Lung CA at 87  but had colon CA years prior.  Social History: Last  updated: 01/25/2010 [facility] in [place].  On disability. Married, one adult child. Never Smoked Alcohol use-no  Risk Factors: Smoking Status: never (01/25/2010)  Review of Systems      See HPI ENT:  Denies decreased hearing. CV:  Denies chest pain or discomfort, shortness of breath with exertion, swelling of feet, and swelling of hands. Endo:  Denies polyuria.  Physical Exam  General:  morbidily obese, NAD. Mouth:  MMM Extremities:  Venous status, LE bilaterally 1+, moderate lymphedema bilaterally Psych:  Oriented X3, memory intact for recent and remote, and normally interactive.     Impression & Recommendations:  Problem # 1:  DIABETES MELLITUS, TYPE II (ICD-250.00) Assessment Improved a1c  slightly improved. Time spent with patient 25 minutes, more than 50% of this time was spent counseling patient on her DM and her controlling her weight. She really wants to work on her diet for 3 months, agreed to start insulin if there is no change in 3 months. Will discuss bariatric surgery with her husband and let me know if she wants to pursue that.  Her updated medication list for this problem includes:    Metformin Hcl 500 Mg Tabs (Metformin hcl) .Marland Kitchen... Take 2 tablets two times a day    Moexipril Hcl 15 Mg Tabs (Moexipril hcl) .Marland Kitchen... Take 1 tablet by mouth once a day    Glyburide 2.5 Mg Tabs (Glyburide) .Marland Kitchen... Take 2  tablet by mouth two times a day    Aspirin 81 Mg Tabs (Aspirin) .Marland Kitchen... Take 1 tablet by mouth once a day    Januvia 100 Mg Tabs (Sitagliptin phosphate) .Marland Kitchen... Take 1 tab by mouth daily  Complete Medication List: 1)  Simvastatin 40 Mg Tabs (Simvastatin) .... Take 1 tablet by mouth once a day 2)  Calcium Carbonate-vitamin D 600-400 Mg-unit Tabs (Calcium carbonate-vitamin d) .Marland Kitchen.. 1200 mg. once daily 3)  Synthroid 175 Mcg Tabs (Levothyroxine sodium) .Marland Kitchen.. 1 tab by mouth daily. 4)  Furosemide 40 Mg Tabs (Furosemide) .... Take 1 tablet by mouth two times a day 5)  Hydrocodone-acetaminophen 10-325 Mg Tabs (Hydrocodone-acetaminophen) .... Take 1 tablet by mouth three times a day 6)  Ibuprofen 800 Mg Tabs (Ibuprofen) .... Take 1 tablet by mouth two times a day to three times a day 7)  Cyclobenzaprine Hcl 5 Mg Tabs (Cyclobenzaprine hcl) .... Take 1 tablet by mouth three times a day 8)  Klor-con M20 20 Meq Cr-tabs (Potassium chloride crys cr) .... Take 1 tablet by mouth once a day 9)  Metformin Hcl 500 Mg Tabs (Metformin hcl) .... Take 2 tablets two times a day 10)  Moexipril Hcl 15 Mg Tabs (Moexipril hcl) .... Take 1 tablet by mouth once a day 11)  Gabapentin 300 Mg Caps (Gabapentin) .... Take 2 capsules by mouth three times a day 12)  Glyburide 2.5 Mg Tabs (Glyburide) .... Take 2  tablet  by mouth two times a day 13)  Morphine Sulfate 15 Mg Tabs (Morphine sulfate) .... Take 1 tablet by mouth three times a day 14)  Aspirin 81 Mg Tabs (Aspirin) .... Take 1 tablet by mouth once a day 15)  Venlafaxine Hcl 75 Mg Tabs (Venlafaxine hcl) .... Take 3 tablets by mouth at bedtime 16)  Januvia 100 Mg Tabs (Sitagliptin phosphate) .... Take 1 tab by mouth daily 17)  Proair Hfa 108 (90 Base) Mcg/act Aers (Albuterol sulfate) .... 2 inh q4h as needed shortness of breath  Patient Instructions: 1)  Recheck a1c in 3 months (250.00).  If it comes  down, we will keep doing what we're doing.   Orders Added: 1)  Est. Patient Level IV [13086]    Current Allergies (reviewed today): ! ERYTHROMYCIN

## 2010-12-02 NOTE — Medication Information (Signed)
Summary: [facility]  [facility]   Imported By: Lanelle Bal 10/18/2010 12:13:06  _____________________________________________________________________  External Attachment:    Type:   Image     Comment:   External Document

## 2010-12-07 ENCOUNTER — Ambulatory Visit (HOSPITAL_BASED_OUTPATIENT_CLINIC_OR_DEPARTMENT_OTHER): Payer: MEDICARE

## 2010-12-07 ENCOUNTER — Ambulatory Visit: Payer: MEDICARE | Attending: Physical Medicine and Rehabilitation

## 2010-12-07 DIAGNOSIS — M5380 Other specified dorsopathies, site unspecified: Secondary | ICD-10-CM

## 2010-12-07 DIAGNOSIS — I890 Lymphedema, not elsewhere classified: Secondary | ICD-10-CM | POA: Insufficient documentation

## 2010-12-07 DIAGNOSIS — M25559 Pain in unspecified hip: Secondary | ICD-10-CM | POA: Insufficient documentation

## 2010-12-07 DIAGNOSIS — M1710 Unilateral primary osteoarthritis, unspecified knee: Secondary | ICD-10-CM | POA: Insufficient documentation

## 2010-12-07 DIAGNOSIS — M545 Low back pain, unspecified: Secondary | ICD-10-CM

## 2010-12-07 DIAGNOSIS — M79609 Pain in unspecified limb: Secondary | ICD-10-CM

## 2010-12-07 DIAGNOSIS — M24559 Contracture, unspecified hip: Secondary | ICD-10-CM | POA: Insufficient documentation

## 2010-12-07 DIAGNOSIS — R269 Unspecified abnormalities of gait and mobility: Secondary | ICD-10-CM | POA: Insufficient documentation

## 2010-12-07 DIAGNOSIS — Q809 Congenital ichthyosis, unspecified: Secondary | ICD-10-CM

## 2011-01-03 ENCOUNTER — Ambulatory Visit: Payer: Medicare Other | Admitting: Physical Medicine and Rehabilitation

## 2011-01-03 ENCOUNTER — Telehealth: Payer: Self-pay | Admitting: Family Medicine

## 2011-01-03 ENCOUNTER — Encounter: Payer: Medicare Other | Attending: Physical Medicine and Rehabilitation

## 2011-01-11 NOTE — Progress Notes (Signed)
Summary: flexiril  Phone Note Refill Request Message from:  Fax from Pharmacy on January 03, 2011 4:46 PM  Refills Requested: Medication #1:  CYCLOBENZAPRINE HCL 5 MG TABS Take 1 tablet by mouth three times a day   Supply Requested: 1 month   Last Refilled: 11/24/2010 [facility] [phone]   Method Requested: Telephone to Pharmacy Initial call taken by: Benny Lennert CMA Duncan Dull),  January 03, 2011 4:46 PM    Prescriptions: CYCLOBENZAPRINE HCL 5 MG TABS (CYCLOBENZAPRINE HCL) Take 1 tablet by mouth three times a day  #90 x 0   Entered and Authorized by:   Ruthe Mannan MD   Signed by:   Ruthe Mannan MD on 01/04/2011   Method used:   Electronically to        [facility]* (retail)       [address]       [place], [place]  [address]       Ph: [phone]       Fax: [phone]   RxID:   [id number]

## 2011-01-18 ENCOUNTER — Other Ambulatory Visit: Payer: Self-pay | Admitting: *Deleted

## 2011-01-18 MED ORDER — GLYBURIDE 2.5 MG PO TABS: ORAL_TABLET | ORAL | Status: DC

## 2011-01-18 MED ORDER — POTASSIUM CHLORIDE CRYS ER 20 MEQ PO TBCR: 20.0000 meq | EXTENDED_RELEASE_TABLET | Freq: Every day | ORAL | Status: DC

## 2011-01-28 ENCOUNTER — Ambulatory Visit: Payer: MEDICARE | Admitting: Physical Medicine and Rehabilitation

## 2011-02-01 ENCOUNTER — Encounter: Payer: MEDICARE | Attending: Neurosurgery | Admitting: Neurosurgery

## 2011-02-01 DIAGNOSIS — M1710 Unilateral primary osteoarthritis, unspecified knee: Secondary | ICD-10-CM

## 2011-02-01 DIAGNOSIS — M545 Low back pain, unspecified: Secondary | ICD-10-CM | POA: Insufficient documentation

## 2011-02-01 DIAGNOSIS — M79609 Pain in unspecified limb: Secondary | ICD-10-CM | POA: Insufficient documentation

## 2011-02-01 DIAGNOSIS — M47817 Spondylosis without myelopathy or radiculopathy, lumbosacral region: Secondary | ICD-10-CM

## 2011-02-01 DIAGNOSIS — M25569 Pain in unspecified knee: Secondary | ICD-10-CM | POA: Insufficient documentation

## 2011-02-07 ENCOUNTER — Other Ambulatory Visit: Payer: Self-pay

## 2011-02-21 ENCOUNTER — Other Ambulatory Visit: Payer: Self-pay | Admitting: *Deleted

## 2011-02-21 MED ORDER — CYCLOBENZAPRINE HCL 5 MG PO TABS: ORAL_TABLET | ORAL | Status: DC

## 2011-03-02 ENCOUNTER — Encounter: Payer: Medicare Other | Attending: Neurosurgery | Admitting: Neurosurgery

## 2011-03-02 ENCOUNTER — Ambulatory Visit: Payer: MEDICARE | Admitting: Physical Medicine and Rehabilitation

## 2011-03-02 DIAGNOSIS — M5380 Other specified dorsopathies, site unspecified: Secondary | ICD-10-CM

## 2011-03-02 DIAGNOSIS — M159 Polyosteoarthritis, unspecified: Secondary | ICD-10-CM | POA: Insufficient documentation

## 2011-03-02 DIAGNOSIS — G8929 Other chronic pain: Secondary | ICD-10-CM | POA: Insufficient documentation

## 2011-03-02 DIAGNOSIS — E1142 Type 2 diabetes mellitus with diabetic polyneuropathy: Secondary | ICD-10-CM

## 2011-03-02 DIAGNOSIS — E118 Type 2 diabetes mellitus with unspecified complications: Secondary | ICD-10-CM | POA: Insufficient documentation

## 2011-03-02 DIAGNOSIS — M79609 Pain in unspecified limb: Secondary | ICD-10-CM | POA: Insufficient documentation

## 2011-03-02 DIAGNOSIS — I890 Lymphedema, not elsewhere classified: Secondary | ICD-10-CM | POA: Insufficient documentation

## 2011-03-02 DIAGNOSIS — M47817 Spondylosis without myelopathy or radiculopathy, lumbosacral region: Secondary | ICD-10-CM | POA: Insufficient documentation

## 2011-03-02 DIAGNOSIS — M545 Low back pain, unspecified: Secondary | ICD-10-CM | POA: Insufficient documentation

## 2011-03-03 NOTE — Assessment & Plan Note (Signed)
Theresa Huffman has followed up in Dr. Leretha Dykes office day for low back and leg pain.  She states these complaints are unchanged.  She has chronic pain with a low back in the lower extremities.  She does have some knee problems also, none of this is new, this is all chronic.  She treats with medicines right now.  She does follow up with her primary care physician for other medical problems such as her diabetes and I think she should especially given her morbid obesity.  Average pain is 7-8.  She states she is here just for med refills today. General activity level is 7-9.  Pain is worse in the daytime and in the evening with activity walking, bending, standing, and tend to aggravate the things at rest and medication tend to help.  She walks without assistance, but she is using her forearm brace, single-point crutches today.  She uses a wheelchair, states most times at home.  She cannot ambulate for any amount of time.  She does not climb steps and she does drive.  She is on disability.  REVIEW OF SYSTEMS:  She has some trouble with ambulation, depression, and weakness, otherwise her 12-point review of system sheets within normal limits.  PAST MEDICAL HISTORY:  Unchanged.  SOCIAL HISTORY:  Unchanged.  PHYSICAL EXAMINATION:  GENERAL:  Today does show again her morbid obesity with problems with ambulation.  She is alert and oriented x3. She is very cordial to speak with but her gait seems somewhat unstable. MUSCULOSKELETAL:  She walks with a kyphotic type of motion.  Motor strength is hard to examine in the lower extremities, appears to be 5/5 dorsiflexion, plantar flexion.  She has decreased sensation in the left lower extremity.  Reflexes are 2 in the upper extremities including the deltoid, biceps, brachioradialis, minimal at best at the quadriceps bilaterally. VITAL SIGNS:  Her blood pressure is 177/81, her respirations are 28, pulse is 128, O2 sats 91 on room air.  She does  seem quite short of breath with just short ambulation distance coming on air.  Her Oswestry score today is 70.  ASSESSMENT: 1. Chronic low back pain with some radicular symptoms. 2. Lumbar spondylosis. 3. Bilateral hip osteoarthritis and pain. 4. Bilateral knee osteoarthritis. 5. Some lower extremity lymphedema. 6. Diabetes with complications.  PLAN:  We will go ahead and refill her medicines today.  I gave her prescription for her MS Contin 15 mg 1 p.o. q.8 hours, 90 with no refill; Norco 10/325 one p.o. t.i.d., 90 with no refill.  She will continue, for safety concerns, to use her wheelchair and single-point forearm braces when out in about.  She will follow up with Dr. Pamelia Hoit in 1 month at her request.  She states she already has an appointment made.  If she has any problems in the interim, she will give [place] a call.  Her questions were encouraged and answered.    Theresa Huffman   RLW/MedQ D:  03/02/2011 16:32:14  T:  03/03/2011 04:48:29  Job #:  [id number]

## 2011-03-15 NOTE — Assessment & Plan Note (Signed)
Theresa Huffman is a 65 year old married female who has been seen in our  pain and rehabilitative clinic for multiple pain complaints, including  predominantly low back pain, bilateral lower extremity pain, knee pain.  Her lower extremity pain is also felt to be secondary to intermittent  lymphedema and knee osteoarthritis.  She states her average pain is  about a 7 to an 8 on a scale of 10, worse with standing and ambulation,  improves with rest, medications.  Pain is reported as being fairly  constant, burning, aching in nature, the low back area into the legs,  interfering significantly with general activity, relationships with  others and enjoyment of life.   She reports fair to good relief with her current meds prescribed by this  clinic.  These medications include the following:  1. Norco 10/325 three times a day on a p.r.n. basis.  2. Ibuprofen 800 mg three times a day p.r.n.  3. Prilosec 20 mg one p.o. q. day.  4. M.S. Contin 15 mg, two in the morning, one in the afternoon and one      at night.  5. She had been on Topamax.  She weaned off it due to cost of      medication; however, she is requesting to be titrated back up on      this medication again.   She is able to walk about 10 minutes at a time.  She is able to climb  stairs and drive.   She is independent with self care, needs some assistance with higher  level functional activities.   She admits to some depression but denies suicidal ideation, denies  problems controlling bowel or bladder.  No new numbness, tingling,  tremors, spasms, dizziness.   REVIEW OF SYSTEMS:  Noncontributory at this time.   She states she is still followed by Dr. Nehemiah Settle, who__________ a  new  primary care physician; however, she did have an appointment with Dr.  Nehemiah Settle, however was unable to keep it.  She is due for a primary care  visit, however,   Past medical, social, family history otherwise unchanged.   EXAMINATION:  VITAL SIGNS:   Blood pressure is 151/84, pulse 110,  respirations 18, 93% saturated on room air.  GENERAL:  She is a morbidly obese female who does not appear in any  distress.  She is oriented x3.  Speech clear.  Affect is bright, alert.  She is cooperative and pleasant.  She follows commands without  difficulty.   Transitioning from sit to stand is difficult, due to her size.  She is  able to ambulate in the room.  It is wide based.  Short stride length is  noted.  Balance is good; however, tandem gait, Romberg's test were not  tested today.  Motor strength is good in the lower extremities.  Reflexes are diminished at the patellar and Achilles' tendon.  Sensation  is grossly intact, and she does have lymphedema bilaterally, with  chronic skin changes noted as well.   IMPRESSION:  1. Lumbago.  2. Spondylosis.  3. Facet arthropathy at L4-5, L5-S1.  4. Bilateral knee osteoarthritis.  5. Bilateral lower extremity lymphedema.   PLAN:  Refill the following medications for her today:  1. Norco 10/325 one p.o. t.i.d. p.r.n. back and knee pain, #90.  2. Topamax 50 mg one p.o. q.h.s. for a week, then b.i.d. for a week,      then one p.o. q.a.m., 2 p.o. q.h.s. for a week then  2 tablets      b.i.d. after that.  3. MS Contin is also filled 50 mg, one p.o. q.a.m., one p.o. q.p.m.      and one p.o. q.h.s.   She does not need refills on ibuprofen or Prilosec today.  She has been  stable on these medications.  She takes them as directed, does not  display an aberrant behavior.  She reports no problems with side effects  from these medications.  She is able to maintain a fairly functional  lifestyle, despite significant impairment in her knees and low back.   PLAN:  We will also set her up for a physical therapy evaluation to  consider Lofstrand crutches for her in community distances and  ambulating.  I have seen her one to two times for an assessment, to see  whether this assistive device may be of any  benefit to her.  Currently,  she is using a cane.  She states she is putting a lot of weight through  the cane.           ______________________________  Brantley Stage, M.D.     DMK/MedQ  D:  07/09/2007 13:08:23  T:  07/09/2007 19:12:37  Job #:  [id number]

## 2011-03-15 NOTE — Assessment & Plan Note (Signed)
Theresa Huffman is a 65 year old morbidly obese female who has  chronic bilateral knee pain and chronic low back pain.   She is also known to have bilateral lymphedema and is diabetic.   She has had some shoulder problems intermittently.  However, her  shoulders are much better.   She was complaining last month of some leg cramping at night.  She has  trialed Neurontin to 2 tablets to 300 mg tablets at night and found that  to be quite helpful, and she is sleeping better.   Average pain is about 8 on a scale of 10 in the low back, knees, lower  extremities below the knee.  Pain is worse with walking, bending and  standing, improves with rest, medication.   She has very good relief with current meds.   MEDICATIONS:  Prescribed through this clinic include the following;  1. Norco 10/325 up to 3 times a day on a p.r.n. basis.  2. Ibuprofen p.r.n. typically take 800 mg up to twice a day.  3. Omeprazole p.r.n.  4. Neurontin 300 mg 2 tablets nightly.  5. MS Contin 15 mg t.i.d.   FUNCTIONAL STATUS:  She is able to walk 10 minutes at a time, has  difficulty climbing stairs, uses forearm crutches, is able to drive.  She is independent with self care, needs some assistance with heavier  household tasks.   REVIEW OF SYSTEMS:  Negative for problems with bowel or bladder.  Admits  to depression.  Denies suicidal ideation.  Denies problems with  oversedation or constipation.   PAST MEDICAL SOCIAL AND FAMILY HISTORY:  Unchanged.   She continues to maintain contact with Dr. Nehemiah Settle.  Her last followup  appointment with him was back in February or March 2010.   Exam; today blood pressure is 156/72, pulse 74, respirations 20, and 91%  saturate on room air.  She is a well developed morbidly obese female who  does not appear in any distress.   Oriented x3.  Speech is clear.  Affect is bright.  She is alert,  cooperative, and pleasant.  Follows commands without difficulty.  Answers  questions appropriately.   Cranial nerves and coordination are grossly intact.  Reflexes are  diminished at the patellar and Achilles tendons.  No abnormal tone is  noted.  No clonus is noted.  Sensation is unchanged, intact below the  knee.  Motor strength 5/5 at hip flexors, knee extensors, dorsiflexors,  and plantar flexors.   Internal and external rotation at the hip does not aggravate her.  She  does have some hip pain complaints with knee flexion/extension, worse on  the left than on the right today.  Shoulder range of motion was without  pain.   Standing, she stands with a slightly flexed hip and hyperlordosis.  Gait  is short stride length, rather wide based.  Unchanged from previous  visits as well.  Balance is good.   IMPRESSION:  1. Chronic low back pain.  2. Bilateral hip flexion contracture.  3. Lumbar spondylosis.  4. Facet arthropathy at L4-5, L5-S1.  5. Bilateral knee osteoarthritis.  6. Bilateral lower extremity edema with lymphedema.  7. Shoulder problems.  Overall improved.   MEDICAL PROBLEMS:  Diabetes, hypothyroidism, hypertension, and  hypercholesterolemia.   PLAN:  Following prescriptions were written today;  1. Norco 10/325 one p.o. t.i.d. p.r.n. knee or back pain #90.  2. Neurontin 300 mg 2 tablets p.o. nightly #60.  3. MS Contin 15 mg 1 p.o. t.i.d.  Theresa Huffman did not bring her bottles in today.  I have requested once  again that she bring them in.  She failed to bring them in about 3 times  in 2009, this is the first time in 2010.  She states she will comply  with this request.   She has been taking her medications as prescribed.  No aberrant behavior  has been observed otherwise.   A urine drug screen today to monitor drug usage.   Therapy orders are written at the last visit to address lower extremity  flexibility, deficits, and hip strength issues that she did have some  difficulty getting out of the chair.  She states she would like to  delay  this 1 more month.  She failed to make the appointment last month,  although orders were written.  We will see her back in 2 months, nursing  visit next month for refill of medications.  Encouraged her to continue  to follow up with Dr. Nehemiah Settle for primary care problems.           ______________________________  Brantley Stage, M.D.     DMK/MedQ  D:  03/05/2009 10:25:21  T:  03/06/2009 16:10:96  Job #:  [id number]   cc:   Deirdre Peer. Polite, M.D.

## 2011-03-15 NOTE — Assessment & Plan Note (Signed)
Theresa Huffman is a 65 year old married white female who is being seen in  our pain and rehabilitative clinic for multiple chronic pain complaints  including low back pain and bilateral lower extremity pain, knee pain  and she is also morbidly obese.   She is back in today and is here for a refill of her medications.   She states there are no new intervening medical problems since her last  visit on August 17, 2007.   Her average pain in the low back is about an 8 on a scale of 10.  Sleep  tends to be poor.  Pain is worse with walking, bending, standing,  improves with rest and medication.  Pain is described as being located  across the low back, in both knees and in the lower extremities below  knees.  Pain is described as constant, aching, and burning in nature.  She also has bilateral lymphedema.   She reports fair relief with the current medications prescribed by our  clinic which includes:  1. Norco 10/325 up to 3 times a day on a p.r.n. basis for back and      knee pain.  2. She also takes Ibuprofen 800 mg t.i.d. on a p.r.n. basis.  3. She uses Prilosec 20 mg 1 p.o. daily.  4. MS Contin 15 mg 2 tablets in the morning, 1 in the afternoon and 1      at night.  5. Topamax 50 mg 2 tablets p.o. b.i.d.   She has no complaints of any problems with epigastric or abdominal pain,  no problems with depression, constipation, or over sedation.   Functional status is as follows:  She is able to walk about 10 minutes  at a time.  She occasionally will use a wheelchair for community  distances.  She has currently been using Lofstrand crutches and has  found them to be quite useful in getting around a little bit easier.  She is independent with feeding, bathing, dressing and toileting.  Needs  some assistance with higher level household tasks such as meal prep,  household duties and shopping.  Admits to some depression, denies  suicidal ideation, no other changes on review of systems that  are  contributory.   Past medical, social, family history otherwise unchanged since our last  visit.   PHYSICAL EXAMINATION:  Blood pressure is 158/87, pulse 122, respirations  24, 94% saturated on room air.  She is a morbidly obese female who does not appear in any distress.  She  is oriented x3, her speech is clear, her affect is bright.  She is  alert, cooperative and pleasant, and follows commands without any  difficulty.  She transitions from sitting to standing with a little difficulty due to  her size.  Her gait in the room tends to be short stride length with a  wide __________ .  She also demonstrated the use of her Lofstrand  crutches.  Initially she was using a 3-point gait and switched to 4  points.  She is still getting used to using the crutches.  She has had  them about 7 to 10 days now.  She has no new pain complaints with respect to using the crutches.  Her reflexes are diminished at the patellar tendons and Achilles  tendons.  She has good strength in the lower extremities without focal  deficit.  Sensation is grossly intact in the lower extremity, no  numbness is appreciated.  She has thickening and chronic skin  changes  secondary to lymphedema in both lower extremities.  Edema is present but  not severe.   IMPRESSION:  1. Lumbago.  2. Lumbar spondylosis.  3. Lumbar facet arthropathy at L4-L5 and L5-S1.  4. Bilateral knee osteoarthritis.  5. Bilateral lower extremity edema.  6. Probable bilateral acromioclavicular joint arthritis as well as      some rotator cuff pathology.   PLAN:  We will refill the following medications for her today:  1. MS Contin 15 mg 2 p.o. q.a.m. 1 p.o. q.p.m. 1 p.o. nightly, no      refills.  2. Norco 10/325 one p.o. t.i.d. p.r.n. back or knee pain #90.  3. Topamax 60 mg 2 p.o. b.i.d. #120 three refills.   We will see her back in a month and she has been stable on the above  medications.  She does not display any aberrant  behavior.  She is doing  well on the new Lofstrand crutches.           ______________________________  Brantley Stage, M.D.     DMK/MedQ  D:  09/14/2007 13:27:39  T:  09/15/2007 07:36:44  Job #:  [id number]

## 2011-03-15 NOTE — Assessment & Plan Note (Signed)
Theresa Huffman is a 65 year old married female who has been seen in our  pain and rehabilitative clinic for multiple pain complaints including  low back pain, bilateral lower extremity pain, knee pain.   Part of her lower extremity pain is felt to be secondary to intermittent  lymphedema and knee osteoarthritis.  Average pain has been about a 7-8  on a scale of 10.  Pain is constant, aching, stabbing, burning in  nature.  Pain is worse with walking and standing, bending, improves with  rest.  She gets fair relief from the current medications that she is on.  Sleep tends to be overall poor.  General activity is significantly  compromised as well as relationships and enjoyment of life.  She does  admit to some depression but denies suicidal ideation.  She is on  Effexor for her depression.   She is able to walk about 10 minutes at a time.  She is able to climb  stairs.  She is able to drive.  She was last employed back in 2001.   REVIEW OF SYSTEMS:  Otherwise noncontributory.   No changes in her past medical, social, or family history since last  visit.   Medications prescribed by our clinic include the following:  1. Narco 10/325 up to t.i.d. on a p.r.n. basis.  2. Ibuprofen 800 mg t.i.d. on a p.r.n. basis.  3. Prilosec 20 mg 1 p.o. daily.  4. MS Contin 15 mg 2 in the morning, 1 in the afternoon, 1 at night.  5. Topamax.  She is currently on 50 mg 2 p.o. b.i.d.   She reports no problems with abdominal pain.  She reports no problems  with depression, constipation, or over-sedation.   On exam today, her blood pressure is 156/78, pulse 126, respirations 18,  93% saturations on room air.  She is a well-developed, morbidly obese female who appears her stated  age and does not appear in any distress.   She is oriented x3.  Her speech is clear.  Her affect is bright.  She is  alert, cooperative, and pleasant.  She follows commands without  difficulty.   Transition from sitting to  standing with a little difficulty due to her  size.  When she is up, she does use a cane in the room.  Her gait is  slightly wide-based, and short-stride length is noted as well.  She has  limitations in lumbar motion in all planes.  Her balance overall is  quite good, although she is unable to perform tandem gait due to her  size.  Her Romberg test is adequate.   Her reflexes are diminished in he lower extremities.  Motor strength,  however, is quite good.  No abnormal tone is noted.  Significant  lymphedema is noted in bilateral lower extremities.   Shoulder range of motion is limited in internal and external rotation as  well as abduction which is limited to approximately 90 degrees  bilaterally.  She does have some tenderness along the acromioclavicular  joint bilaterally as well.   IMPRESSION:  1. Lumbago.  2. Spondylosis.  3. Facet arthropathy at L4-5, L5-S1.  4. Bilateral knee osteoarthritis.  5. Bilateral lower extremity lymphedema.  6. Probable bilateral acromioclavicular joint osteoarthritis as well      as rotator cuff pathology.   PLAN:  I will refill the following medications for her today:  1. Norco 10/325, 1 p.o. t.i.d. p.r.n. back, neck, and knee pain #90,  no refills.  2. MS Contin 50 mg 2 p.o. q.a.m., 1 p.o. q.p.m., and 1 p.o. q.h.s.      #120, no refills.  3. She does not need refills on anything else today.  She has been      stable on these medications, takes her medications as appropriate.           ______________________________  Brantley Stage, M.D.     DMK/MedQ  D:  08/17/2007 09:58:37  T:  08/18/2007 11:50:28  Job #:  [id number]

## 2011-03-15 NOTE — Assessment & Plan Note (Signed)
[name] is a 65 year old married woman who is morbidly obese.  She  is followed in our [facility] for multiple chronic  pain complaints including low back pain and bilateral knee pain.  She  has lymphedema with bilateral lower extremity pain and is known to have  lumbar facet arthropathy.  In the last month, she was having some  trouble with some left upper extremity pain, which was aggravated by  being on her computer 12 hours a day, playing computer games with her  head rotated to the left.   She reports that after changing the ergonomic set up of her computer,  she has noted overall improvement in her arm.  She no longer has  constant pain.  She notes that it occurs everyday about 2-3 times a day,  lasting now more than 10 seconds each.   Pain is probably today in the low back and bilateral knees.  Sleep tends  to be poor.  Her pain is worse with walking, bending, and standing.  She  has difficulty standing for much more than 10 minutes.  She relates that  she recently had to get a mammogram and had trouble standing for the  mammogram.   FUNCTIONAL STATUS:  The patient is able to walk about 10 minutes at a  time.  She uses Lofstrand crutches.  She does not climb stairs.  She is  able to drive.  She is independent with self-care and needs assistance  with high-level household tasks.  Denies problems controlling bowel or  bladder.  Occasionally notes numbness and tingling in the left upper  extremity.  Reports some depression.  Denies suicidal ideation.   REVIEW OF SYSTEMS:  Otherwise negative.   Past medical, social, and family histories are unchanged.   She was last seen on July 04, 2008.   PHYSICAL EXAMINATION:  VITAL SIGNS:  Today, blood pressure is 152/70,  pulse 118, respirations 20, and 92% saturated on room air.  GENERAL:  She is a morbidly obese female who does not appear in any  distress.  She is oriented x3.  Speech is clear.  Affect is  bright.  She  is alert, cooperative, and pleasant.   Her cranial nerves are grossly intact.  Coordination is intact.  Sensation is intact in the left upper and lower extremities.  Motor  strength is 5/5 in the bilateral upper extremities and lower  extremities.  Reflexes are diminished in the upper and lower  extremities.  There is no abnormal tone noted.  No clonus is noted.  Her  motor strength is in the 5/5 range without focal deficits.   She has relatively well-preserved range of motion in the neck with  respect to rotation, lateral flexion, flexion, and extension.  She has  limitations in shoulder motion with abduction to about 90 degrees  bilaterally.  She has approximately 80-90 degrees of external rotation.  Internal rotation is limited to about 30 degrees bilaterally.   Lymphedema is noted in the bilateral lower extremities.   Balance is good.  Gait revealed short stride length, wide-base gait,  slightly antalgic due to knee pain.   IMPRESSION:  1. Lumbar spondylosis with low back pain.  2. Lumbar facet arthropathy, L4-L5 and L5-S1.  3. Bilateral knee osteoarthritis.  4. Bilateral lower extremity lymphedema.  5. Left upper extremity numbness and tingling, overall improved.  Pain      has improved.  She does have occasional tingling about 2-3 times a  day, lasting for 10 seconds each now.   Medical problems include diabetes, hypothyroidism, hypertension and  hypercholesterolemia.  She states that Dr. Nehemiah Settle did not think she  needed a sleep apnea study at this time.   PLAN:  We will refill the following medications for her;  1. Norco 10/325 up to 3 times per day, #90.  2. MS Contin 15 mg 2 p.o. every morning, 1 p.o. every evening, and 1      p.o. nightly, #120 per month.  No refills on either of these.  3. She also takes Prilosec p.r.n.  4. Ibuprofen on a p.r.n. basis.   She has been stable on the above medications.  She takes them as  prescribed.  No aberrant  behavior is observed, and she reports she is  able to maintain a relatively functional lifestyle despite multiple  physical impairments.  With the use of this medication, she is able to  get out to see her brother at couple times a week and is able to make it  independently to the bathroom and back.  She states she would have a  much more difficult time walking if she were not on pain medication.  With respect to her neck, she reports overall improvement in this.  We  had considered x-rays and diagnostic studies at this point.  As  significantly improved, she is not interested in further workup.  I will  see her back in 2 months.  Nursing visit next visit.  We did discuss  getting her involved in a physical therapy program to address shoulder  strength and mobility deficits in her shoulders.  She is using Lofstrand  crutches and is putting good bit of weight through her shoulders now  with their use.  We would like to consider physical therapy in the  upcoming months.  We will see her back in 2 months.           ______________________________  Brantley Stage, M.D.     DMK/MedQ  D:  08/01/2008 12:15:28  T:  08/02/2008 00:41:36  Job #:  [id number]

## 2011-03-15 NOTE — Assessment & Plan Note (Signed)
Ms. Diehl is a 65 year old married female who is morbidly obese and  followed in our pain and rehabilitative clinic for multiple chronic pain  complaints which include low back pain, bilateral knee pain, lymphedema  with bilateral lower extremity pain and is known to have lumbar facet  arthropathy.   She has returned to our clinic for a refill of her medications today.  She states there has been no changes in her medical history since she  was seen last by me on December 09, 2007.  She has also had an interim  nursing visit on 01/17/2008.   She reports her average pain is about an 8 on a scale of 10, localized  to the low back and knees and lower legs.  The pain is typically worse  with walking, bending, standing.  Improves with rest and medication.  She reports fair relief with current meds that she is on.  She is able  to walk about 10 minutes at a time.  She does use her Lofstrand crutches  for ambulation.   She does use a wheelchair for longer distances and has a ramp to access  her home.  She states that she has had a small open area on her left leg  along the lateral aspect of the left leg and is requesting a cream to  apply to it.   Her functional status is as follows:  She is independent with self care.  Needs assistance with meal prep, household duties, shopping. She denies  problems controlling bowel or bladder.  She admits to some depression,  but denies suicidal ideation or anxiety.   Review of systems is otherwise noncontributory.   PAST MEDICAL HISTORY:  1. Positive for hypercholesterolemia.  2. Hypertension.  3. Hypothyroidism. She is on thyroid replacement medication.   PAST SURGICAL HISTORY:  1. Tubal ligation.  2. Cholecystectomy.   SOCIAL HISTORY:  Unchanged.   FAMILY HISTORY:  Unchanged.   PHYSICAL EXAMINATION:  Blood pressure is 152/80, pulse 109, respirations  20, 91% saturated on room air.  She is a morbidly obese female who does  not appear in any  distress.  She is oriented x3 and her speech is clear.  Her affect is bright. She is alert, cooperative and pleasant.  She  follows commands without difficulty.   Transitioning from sitting to standing is somewhat difficult for her.  She is able to push herself up using one of her Lofstrand crutches.   She has a very stable four point gait in the room.  Without the crutches  she has a very short stride length and rather antalgic gait.   She has limitations in lumbar motion in all planes.  Her hip range of  motion is mildly limited, but she does not report pain with internal or  external rotation of her hips.  She has some tenderness along the joint  lines of both knees.  She has a significant amount of lymphedema in both  lower extremities.  Her left lower extremity is remarkable for  lymphedema and an area of erythema laterally along the left lower  extremity.  There is no open or draining areas at this time, but it  appears she has a small lesion.  Healing over with a scab.   Neurologic exam reveals intact reflexes in the upper and lower  extremities.  Motor strength is 5/5 without focal deficits.  Sensation  is intact.  Her coordination and balance are within normal limits as  well.  IMPRESSION:  1. Lumbago.  2. Lumbar spondylosis.  3. Lumbar facet arthropathy at L4-5, L5-S1.  4. Bilateral knee osteoarthritis.  5. Bilateral lower extremity edema.   MEDICAL PROBLEMS:  1. Hypothyroidism.  2. Hypertension.  3. Hypercholesterolemia.   PLAN:  A long discussion regarding her lymphedema today.  She does not  wrap her legs anymore to decrease the amount of fluid in them.  I have  suggested she restart wrapping them.  She has been reluctant because it  is somewhat of an inconvenience for her.  Her husband needs to do this.   I asked her to follow up with Dr. Nehemiah Settle regarding the lymphedema and to  have a look at her lower extremities as she may be at risk for infection  and  cellulitis with the extra fluid in her lower extremities.   We also had a discussion today about sleep.  She does have difficulty  sleeping.  She is able to sleep about three hours in her bed and then  she moves to her recliner.  I have some concerns that she may have  possibly some problems with sleep apnea given her size and being on MS-  Contin in the evening.  I have suggested that she consider a sleep study  to rule this out and she will discuss this further with Dr. Nehemiah Settle.   I will refill her medications today which will include MS-Contin 15 mg  two p.o. q.a.m., one p.o. q.p.m. and one p.o. q.h.s.  We will also  refill her Ibuprofen 800 mg one p.o. t.i.d. p.r.n. knee, hip or back  pain, #90.  She requested a 3 month supply. I have reviewed the risks  and benefits of this medication with her. She has tried other  nonsteroidal's in the past and has found Ibuprofen to be the most  beneficial for her and she does understand that there can be a  substantial risk for GI bleed being on this medication on a daily basis.  She states she really takes it only about twice a day and is on  Omeprazole as well.   I will also refill her Narco 10/325 one p.o. t.i.d. p.r.n. knee or back  pain, #90. I will see her back in a month.           ______________________________  Brantley Stage, M.D.     DMK/MedQ  D:  02/20/2008 13:42:18  T:  02/20/2008 14:04:27  Job #:  [id number]   cc:   Deirdre Peer. Polite, M.D.

## 2011-03-15 NOTE — Assessment & Plan Note (Signed)
Theresa Huffman is a 65 year old married, white, morbidly-obese female who  is being seen in our [facility] for multiple chronic  complaints including low back pain, bilateral lower extremity pain and  knee pain.   She is back in today for refill of her medications.  She states there no  intervening medical problems since her last visit which was September 14, 2007.   Average pain is about 8 on a scale of 10.  The pain is described as  constant, dull, aching, and burning in nature, aggravated by walking,  bending, standing, improved with rest, medication.   Reports fair to good relief with the current meds that she is on.   MEDICATIONS PROVIDED BY OUR CLINIC:  1. Norco 10/325 up to 3 times a day on a p.r.n. basis for back and      knee pain.  2. Also takes ibuprofen 800 mg 1 p.o. t.i.d. p.r.n.  3. Prilosec 20 mg 1 p.o. daily.  4. MS Contin 15 mg 2 tablets in the morning, 1 in the afternoon, and 1      at night.  5. Topamax 50 mg 2 tablets p.o. b.i.d.   No complaint of epigastric pain is noted.  No problems with constipation  or over-sedation.   She is able to walk 10 minutes at a time.  She is able to climb stairs  and drive.  She is independent with her self care.  Needs assistance  with higher level household activities, such as meal prep and household  duties.   REVIEW OF SYSTEMS:  Negative for anxiety or confusion.  Admits to some  depression.  Denies suicidal ideation.  Review of systems is otherwise  noncontributory.   PAST MEDICAL, SOCIAL, FAMILY HISTORY:  Unchanged from last visit.   PHYSICAL EXAMINATION:  Blood pressure today is 153/70.  Pulse 122.  Respiration 18, 91% saturated on room air.  She is a morbidly obese  female, who does not appear in any distress.  She is oriented x3.  Her  speech is clear.  Her affect is bright.  She is alert, cooperative and  pleasant.  Follows commands without difficulty.   Transitions from sitting to standing with  a little bit of effort.  Once  she is up she uses her Lofstrand crutches for ambulation.  Her gait ws  observed in the hall today as she walked.  She has a 4-point gait and is  able to coordinate this quite well now, and looked very stable on her  crutches.  Reflexes are overall diminished in the lower extremities at  the patellar and Achilles tendons bilaterally.  Her lymphedema has  significantly improved over the last several months.  Motor strength is  good in the lower extremities, without focal deficit.   IMPRESSION:  1. Lumbago.  2. Lumbar spondylosis.  3. Lumbar facet arthropathy at L4-5 and L5-S1.  4. Bilateral knee osteoarthritis.  5. Bilateral lower extremity lymphedema, overall improved.  6. Bilateral acromioclavicular joint arthritis as well as some rotator      cuff pathology.  Shoulders are currently not a problem for today,      no pain in the shoulder areas.  Full range of motion is      appreciated.   PLAN:  Will refill the following medications for her:  1. Ibuprofen 800 mg 1 p.o. t.i.d. p.r.n. back or knee pain, #90.  2. Prilosec 20 mg 1 p.o. every day, #30, with 3 refills of both.  3. Norco 10/325 up to 3 times a day for back and leg pain, #90, no      refills.  4. M.D. Contin 15 mg 2 p.o. q.a.m., 1 p.o. q.p.m., and 1 p.o. at      bedtime, #120, no refills.   She has been stable on these medications.  She does not display any  aberrant behavior.  Her pill counts are appropriate.  She is doing well  on her Lofstrand crutches and is able to maintain a fairly functional  life style despite significant physical impairment.  Will see her back  in a month with a nursing visit, and I will see her back in 2 months.           ______________________________  Brantley Stage, M.D.     DMK/MedQ  D:  10/15/2007 13:56:25  T:  10/15/2007 23:50:58  Job #:  [id number]

## 2011-03-15 NOTE — Assessment & Plan Note (Signed)
Theresa Huffman is a 65 year old married female who is morbidly  obese and followed in our pain and rehabilitative clinic for multiple  chronic pain complaints including low back pain, bilateral knee pain,  lymphedema with bilateral lower extremity pain and is known to have  lumbar facet arthropathy.   She is back in today for refill of her MS Contin and Norco.   She states she has a new problem, which is intermittent right upper  extremity pain.  She states this pain comes on after she has been  sitting in her recliner.  It does not come on until she has been sitting  in the recliner for a while.  While she is in the recliner, she rotates  her neck to the right to play on her laptop computer, which is located  to the right of her right upper extremity.   She spends approximately 12 hours a day on her laptop playing computer  games.   Once she stops playing computer games and gets up and moves around, her  left upper extremity pain improves.   It does not bother her while she is up walking and nor when she is  sleeping at night.   Her average pain in the low back and legs is 8 on a scale of 10.  Pain  is described as constant, tingling, and aching, significantly  interfering with her general activity levels, sleep tends to be poor,  she has more pain with walking and standing and pain typically improves  with rest and medication.  She gets fair relief with current meds.   [facility]  include:  1. Norco 10/325 up to 3 times a day.  2. Prilosec 20 mg daily.  3. MS Contin 15 mg 2 in the morning, 1 in the p.m., and 1 at bedtime.  4. Ibuprofen p.r.n., not more than 800 mg 3 times a day.   FUNCTION:  The patient is able to walk about 10 minutes at a time.  She  does not climb stairs.  She is able to drive.  She uses Lofstrand  crutches to walk.  She is independent with self care, needs assistance  with higher level  household activities.   No problems with respect to controlling bowel and bladder, admits to  depression.  Denies suicidal ideations.  Occasionally, has some tingling  in the left upper extremity when she is on a computer.   REVIEW OF SYSTEMS:  Otherwise, negative.   PAST MEDICAL HISTORY:  Unchanged.   SOCIAL HISTORY:  Remarkable for a brother who had a recent heart attack.  She was out of state helping him, up in [place] during this period.   FAMILY HISTORY:  Unchanged.   PHYSICAL EXAMINATION:  VITAL SIGNS:  Blood pressure is 151/78, pulse  101, respirations 20, and 90% saturated on room air.  GENERAL:  She is a morbidly obese female who does not appear in any  distress.  NEUROLOGIC:  She is oriented x3.  Speech is clear.  Affect is bright.  She is alert, cooperative, and pleasant.  She follows commands without  difficulty.   Cranial nerves are grossly intact.  Coordination is intact.  Reflexes  are diminished in the upper and lower extremities.  Motor strength is  5/5 without focal deficit.  Sensation is intact in the left upper  extremity as well as bilateral lower extremities.   Gait reveals short stride length and wide base due  to girth, so has  significant lymphedema below the knees.   IMPRESSION:  1. Lumbago.  2. Lumbar spondylosis.  3. Lumbar facet arthropathy L4-L5 and L5-S1.  4. Bilateral knee osteoarthritis.  5. Bilateral lower extremity edema.  6. New left upper extremity numbness and tingling with prolonged      computer use.   MEDICAL PROBLEMS:  Include diagnoses of:  1. Diabetes mellitus.  2. Hypothyroidism, currently on replacement.  3. Hypertension.  4. Hypercholesterolemia.  5. Recent workup for sleep apnea.   PLAN:  We will refill the following medications for her today:  1. Norco 10/325 one p.o. t.i.d. p.r.n., #90 per month.  2. MS Contin 15 mg 2 p.o. q.a.m., 1 p.o. q.p.m., and 1 p.o. at      bedtime, #120 per month.  No refills on either.  We  will see her back in a month.  Monitor her  left upper extremity numbness and tingling.  Anticipate it will improve  if she changes her ergonomic setup with her computer.  Otherwise, we  will look into this further for her with possible electrodiagnostic  studies or neck x-rays.   Ms. Corney takes her medication as prescribed and her pill counts have  been appropriate.  We will see her back in a month.           ______________________________  Brantley Stage, M.D.     DMK/MedQ  D:  07/04/2008 14:10:43  T:  07/05/2008 05:15:16  Job #:  [id number]

## 2011-03-15 NOTE — Assessment & Plan Note (Signed)
Theresa Huffman is a 65 year old morbidly obese female, who has chronic  bilateral knee pain and chronic low back pain.  She is also known to  have bilateral lower extremity lymphedema and is diabetic.   She has also had some left shoulder problems with limitations in range  of motion and pain.   She continues to have chronic bilateral knee pain and low back pain.  She reports that her left shoulder is somewhat better with respect to  pain.  She has had some recent redness and more swelling in her lower  extremities.  However, she believes it is somewhat improved.  She has an  appointment with Dr. Nehemiah Settle, pending.   Her average pain is about an 8 on scale of 10.  Pain is typically worse  with activities such as walking, standing, bending, improves with rest  and medication.  She gets fair-to-good relief with current meds  prescribed through our clinic.   Medications from this clinic include the following:  1. Hydrocodone and acetaminophen 10/325 up to 3 times a day.  2. Ibuprofen 800 mg 2-3 times per day.  3. Omeprazole 20 mg daily.  4. MS Contin 15 mg 3 times a day.  5. Neurontin 300 mg 2 tablets at night.   She reports no problems with the above medications and problems with  oversedation or constipation or gait instability.   Functional status is as follows.  She uses forearm crutches for  ambulation, can walk about 10 minutes at time, she does drive.  She has  difficulty with stairs.  She will use a wheelchair for community  distances.   She requires assistance with higher household tasks, but is independent  with basic self-care.   Denies problems with controlling bowel or bladder.  Does admit to some  weakness, which is not an acute issue, trouble walking.  Admits to  depression, but denies suicidal ideation.   Review of systems otherwise negative.  Past medical, social, and family  unchanged.   Exam, blood pressure is 155/77, pulse 83, respirations 18, and 94%  saturated on room air.   She is well-developed, morbidly obese female who does not appear in any  distress.  She is oriented x3.  Speech is clear.  Affect is bright.  She  is alert, cooperative, and pleasant.  Follows commands without  difficulty, answers my questions appropriately.  Cranial nerves and  coordination are intact.  Reflexes are diminished in both upper as well  as lower extremities.  Motor strength is good in both upper and lower  extremities without focal deficits.  No new sensory deficits are  appreciated.   Transitioning from sitting to standing is somewhat difficult due to her  size.  She is able to walk independently with her forearm crutches with  a short stride length slightly wide-based gait.   Further evaluation reveals decreased range of motion in the left  shoulder, especially with internal rotation.  She does complain of some  pain with abduction as well.   Lower extremities continue to show evidence of lymphedema, skin changes  related to that.  No tenderness is noted.  No increased warmth is noted  in the lower extremities, pulse is a difficult to appreciate due to  excess tissue.   IMPRESSION:  1. Chronic low back pain.  2. Bilateral hip flexion contracture.  3. Lumbar spondylosis.  4. Facet arthropathy L4-5, L5-S1.  5. Bilateral knee osteoarthritis.  6. Bilateral lower extremity lymphedema.  7. Left shoulder pain  consistent with impingement, pain somewhat      improved.   Medical problems include diabetes mellitus type 2, hypothyroidism,  hypertension, and hypercholesterolemia.   PLAN:  Refill the following medications for her today:  1. MS Contin 15 mg 1 p.o. t.i.d., #90, no refills.  2. Norco 10/325 mg 1 p.o. t.i.d. p.r.n. back or leg pain, #90.  3. Omeprazole 20 mg 1 p.o. daily.   She does not need any refills today on her ibuprofen or her Neurontin.   Ms. Caniglia has been tolerating her medications well.  Reports no  significant side  effects with their use.  Reports fair-to-good relief  with their use.  She is able to maintain a relatively independent  lifestyle despite significant functional deficit.  Again, I have  requested she follow up with Dr. Nehemiah Settle, should her lower extremities  become more erythematous and painful to rule out any problems with  cellulitis.  She states she will comply with this request.  We will see  her back in a month.           ______________________________  Brantley Stage, M.D.     DMK/MedQ  D:  05/08/2009 10:00:01  T:  05/08/2009 23:57:40  Job #:  [id number]

## 2011-03-15 NOTE — Assessment & Plan Note (Signed)
Theresa Huffman is a 65 year old married woman who is being seen in  our pain and rehabilitative clinic for multiple pain complaints,  including predominantly low back pain, bilateral lower extremity pain,  and knee pain.   The lower extremity pain is felt to be secondary to intermittent  lymphedema and knee osteoarthritis.   Her average pain is about a 7 on a scale of 10.  Sleep tends to be poor.   She reports that her function has improved with the use of MS Contin  during the day.  She reports fair to good relief with the current meds.  She is also taking Norco on a p.r.n. basis, up to three times a day as  well.  She is walking about 10 minute at a time.  She is able to drive.  She is independent with her self-care and needs some assistance with  higher level activities.   Admits to some depression but denies suicidal ideation.   Her primary care physician is Dr. Nehemiah Settle.   No changes in past medical, social, or family history since last visit.   PHYSICAL EXAMINATION:  VITAL SIGNS:  Blood pressure is 162/93, pulse  115, respirations 20, 92% saturation on room air.  GENERAL:  She is a morbidly obese white female who does not appear in  any distress.  She is oriented x3.  Speech is clear.  Affect is bright,  alert, cooperative and pleasant.  Follows commands without difficulty.  NEUROMUSCULAR:  Transitions from sit to stand with difficulty due to her  size.  Her gait in the room is wide-based, short stride length  bilaterally.  Tandem gait is not assessed.  Romberg's test is not  assessed today.  She has overall good balance.  Her reflexes are 0 at  the patellar tendons and Achilles tendons bilaterally.  She has good  strength.  No focal weaknesses appreciated at hip flexors, knee  extensors, dorsiflexors, or plantar flexors.  She has bilateral lower  extremity lymphedema.  Lumbar motion is limited in forward flexion, and  she reports discomfort with extension.   IMPRESSION:  1. Lumbago.  2. Lumbar spondylosis.  3. Facet arthropathy at L4-5, L5-S1.  4. Bilateral knee osteoarthritis.  5. Bilateral lower extremity lymphedema.   Patient states that her husband has not been wrapping for the last  couple of months to help her with her lymphedema.  Apparently, he has  some hip pain and has difficulty doing this for her.  She reports no  complaints with respect to the medications, no side effects from the  ibuprofen or narcotics.  Denies drowsiness or constipation.  Denies any  abdominal complaints.   PLAN:  Will refill her Norco 10/325 1 p.o. t.i.d. p.r.n. back and leg  pain, #90, no refills.  Will increase her MS Contin slightly, 50 mg 2  p.o. q.a.m., 1 p.o. q.p.m., and 1 p.o. nightly, #120, no refills.   A 10-15 minute discussion regarding narcotics, including tolerance and  withdrawal symptoms and side effects.  She verbalized understanding  regarding these issues, cautioned on driving and to skip doses if she  plans to be driving.  Will see her back in two months.  A nursing visit next month.  She does  have an appointment with Dr. Nehemiah Settle.  She intends to discuss sleep  issues with him as well as gastric weight loss procedure.           ______________________________  Brantley Stage, M.D.  DMK/MedQ  D:  05/09/2007 13:18:28  T:  05/10/2007 81:19:14  Job #:  [id number]

## 2011-03-15 NOTE — Assessment & Plan Note (Signed)
[name] is a 65 year old morbidly obese female, who has a  history of chronic bilateral knee pain and chronic low back pain, who  was also known to have a bilateral lower extremity lymphedema and is  diabetic.   She is back in today for a recheck and refill of her medications.  She  has had some increased lower extremity tingling and her primary care Dr.  Nehemiah Huffman increased her Neurontin over the last couple of months for her  and she has found this to be rather helpful in helping with her leg  discomfort.   Her average pain, she indicates is about 8 on a scale of 10.  Pain is  typically worse with activities such as standing and walking.  Improves  with rest and medication.  Pain is worse in the morning and toward the  end of the day.   FUNCTIONAL STATUS:  She is able to walk about 10 minutes at a time using  forearm crutches.  She has difficulty negotiating stairs.  She is able  to drive.  She is independent with self-care and needs assistance with  higher level household tasks.   REVIEW OF SYSTEMS:  Positive for trouble walking, otherwise.   Medications which are provided through this clinic include the  following:  1. Norco 10/325 up to 3 times per day.  2. MS Contin 15 mg up to 3 times a day.  3. Ibuprofen 800 mg 2-3 times a day.  4. Omeprazole 20 mg daily.  5. Neurontin has been increased by Dr. Nehemiah Huffman up to 300 mg 2 tablets      t.i.d.   No other changes in past medical, social, or family history since last  visit.   Exam, blood pressure is 139/72, pulse 89, respirations 18, 91% saturated  on room air.  She is a morbidly obese female, who does not appear in any  distress.   She is oriented x3.  Speech is clear.  Affect is bright.  She is alert,  cooperative, and pleasant.  Follows commands without difficulty and  answers my questions appropriately.   Cranial nerves and coordination are intact.   Reflexes are diminished in both lower extremities.  No  abnormal tone,  clonus, or tremors are appreciated.  Sensory exam is intact to light  touch.  Motor strength 5/5 at hip flexors, knee extensors, dorsiflexors,  and plantar flexors.   Internal and external rotation at the hips does not aggravate any groin  pain.   Some tenderness in the lumbar paraspinal musculature is appreciated with  palpation.   Transitioning from sitting to standing is done with minimal difficulty.  Gait in the room is stable with crutches.  Tandem gait and Romberg test  are not assessed, but overall good balance is laid during her gait.  Lower extremities are notable for minimal edema today.   IMPRESSION:  1. Chronic low back pain.  2. Lumbar spondylosis.  3. History of bilateral hip flexion contracture.  4. Facet arthropathy L4-5, L5-S1.  5. Bilateral knee osteoarthritis.  6. Bilateral lower extremity lymphedema.  7. Shoulder pain currently not a problem.   ASSESSMENT:  [name] is a 65 year old with chronic back  pain, lower extremity lymphedema, bilateral knee pain and ambulates with  forearm crutches.  Overall, is doing well on current medications  provided by this clinic.  She does not exhibit any aberrant behavior.  Take the medications as prescribed and reports overall fairly good  relief with their  use.   PLAN:  We will refill following medications for her, MS Contin 15 mg 1  p.o. t.i.d. #90, no refills, Norco 10/325 one p.o. t.i.d. p.r.n. back or  leg pain #90, no refills.  We will have her follow back up with a  nursing staff over the next few months.  I will see her back in 3  months.  She continues to use her medications as prescribed with good  relief.           ______________________________  Brantley Stage, M.D.     DMK/MedQ  D:  06/05/2009 12:47:21  T:  06/06/2009 00:29:34  Job #:  [id number]

## 2011-03-15 NOTE — Assessment & Plan Note (Signed)
[name] is a 65 year old married morbidly obese female who is being  followed in our pain and Rehabilitative for multiple chronic pain  complaints which include low back pain, bilateral lower extremity pain,  lymphedema, bilateral knee osteoarthritis, lumbar facet arthropathy.   She is back in today and she brings in her granddaughter. She is  requesting a refill of her medications.  She states there has been no  intervening medical problems since her last visit.  She was last seen in  our clinic on November 13, 2007 by nursing staff for a refill and I saw  her on October 15, 2007.   Her chief complaints are low back pain, which is localized to the low  lumbar region bilaterally.  She also has complaints of bilateral knee  pain.  Her lower extremities below her knee bother her somewhat as well.  She describes the lower extremities  as about a 5 or a 6 on a scale of  10.  The knees are about a 9 on a scale of 10.  Back is about an 8 to 9  on a scale of 10.   Her knees and back limit her from being up much more than 10 minutes as  well as her shortness of breath.   She gets fair relief with the current meds prescribed by our clinic.  Her pain is described as constant aching while she is up improved  somewhat with rest and medications.   She uses Lofstrand crutches for ambulation.  She is able to climb  stairs.  She is able to drive.  She is independent with her self care  and needs some assistance with higher level household activities.   She denies any new medical problems in the interim.  She has been having  her thyroid medication increased over the last few months.   Past medical history, social, family history are otherwise unchanged.   PHYSICAL EXAMINATION:  Her blood pressure is elevated today 193/89.  She  is aware. She will get it rechecked and let her primary care physician  know if it continues to be elevated.  She has no complaints of headache.  No shortness of  breath, chest pain, visual changes, numbness, or  tingling.  Her pulse is 110.  Respirations  18.  Saturations 92 percent  on room air.  She is well developed.  Morbidly obese female who does not appear in any  distress.  She is oriented times 3. Her speech is clear.  Her affect is  bright.  She is alert, cooperative, and pleasant.  She follows commands  easily.   Transitioning from sitting to standing is done with some difficulty due  to her size.  She uses her Lofstrand  crutches to ambulate. She has a  short-stride length, wide-based gait.   Lumbar motion is limited in all planes.   Reflexes are diminished in the lower extremities.  She has some  tenderness  over her medial joint line in the right knee.  I am unable  to appreciate any kind of an effusion.   Her motor strength in the lower extremities is good.  She has no sensory  deficits. She has significant lymphedema below the knee.   IMPRESSION:  1. Lumbago.  2. Lumbar spondylosis.  3. Lumbar facet arthropathy at L4-5, L5-S1.  4. Bilateral knee osteoarthritis.  5. Bilateral lower extremity lymphedema.  6. Medical problems include:  History of hypothyroidism, hypertension,      hypercholesterolemia.  PLAN:  Will refill her MS Contin today, 15 mg two p.o. every a.m.,  1  p.o. q.p.m.,  1 p.o. at every bedtime #120 no refills and will also  refill her Norco 10/325 one p.o. t.i.d. #90 no refills.  She does not  need any refills on her ibuprofen or her Prilosec.  She does not need a  refill on her Topamax today.  She has been stable on the above  medications. She takes them as prescribed.  This does not display and  aberrant behavior and she is able to remain relatively functional  despite significant physical impairments.  Will see her back in 2  months.  Nursing visit next month for refill of her medications.  She  has had no problems with  her oversedation or constipation from the  above medications.  I have asked her to  follow up with her primary care  physician for her high blood pressure.           ______________________________  Brantley Stage, M.D.     DMK/MedQ  D:  12/19/2007 12:24:43  T:  12/20/2007 07:46:25  Job #:  [id number]

## 2011-03-15 NOTE — Assessment & Plan Note (Signed)
[name] is a 65 year old morbidly obese female, who is  followed in our Pain and Rehabilitative Clinic for chronic low back  pain, bilateral lower extremity pain, and bilateral knee pain.   She also is known to have bilateral lymphedema and is diabetic.  Intermittently, she has had some shoulder problems.  However, today she  is not having any problems with her shoulders.   She is back in today for refill of her pain medications.  She was last  seen by me on December 29, 2008.  In the interim, she has been healthy and  has had no medical problems.   Her average pain is about an 8 on a scale of 10, mainly low back  bilateral knees and the distal lower extremities as well.  She gets some  burning and aching.   She complains of brief more recently over the last year of some cramping  in the lower extremities especially at night lasting about 10 minutes.  She puts a hot washed rug on it and seems to get better.   Sleep tends to be poor.  She can sleep about 4-5 hours and she gets up  and goes and sits in her recliner.   Pain is worse with walking, standing, and improves with rest and  medications.  She reports fair relief with current meds.   Medications at this time from this clinic include:  1. Norco 10/325 up to 3 times day.  2. Ibuprofen 800 mg up to 2-3 times a day.  3. Omeprazole 20 mg daily.  4. MS Contin 15 mg t.i.d.   FUNCTIONAL STATUS:  She is able to walk about 10 minutes at a time.  She  does not climb stairs.  She is able to drive.  She uses forearm crutches  for walking independent with self care, needs assistance with heavier  household tasks.   REVIEW OF SYSTEMS:  Positive for weakness, trouble walking, and  depression.  Denies suicidal ideation.  Denies problems controlling  bowel or bladder, negative regarding constitutional, GI, GU, or  cardiorespiratory problems.   Denies any new problems with respect to past medical, social, or family  history.   PHYSICAL EXAMINATION:  VITAL SIGNS:  Today blood pressure is 174/84,  pulse 114, respirations 20, and 92% saturated on room air.  GENERAL:  She is a morbidly obese female, who does not appear in any  distress.  NEUROLOGIC:  She is oriented x3.  Speech is clear.  Affect is bright.  She is alert, cooperative, and pleasant.  Follows commands without  difficulty and answers questions appropriately.   Cranial nerves are grossly intact.  Coordination is intact.   EXTREMITIES:  Reflexes are diminished in the lower extremities.  Sensory  exam is intact in the lower extremities as well to light touch.  She has  5/5 strength at hip flexors, knee extensors, dorsiflexors, and plantar  flexors.   She complains of some posterior hip pain with internal-external rotation  of the right hip and left hip.  No pain with internal or external  rotation.  She has bilateral knee pain with palpation about the joint  both right and left knee medial and lateral joint line are involved as  well as patella.   Straight leg raise is negative.   Transitioning from sitting to standing is done with some difficulty.  Gait is basically wide, wide base of support, short stride length.  She  was unable to extend her hips fully and  complains of back pain with  extension.   IMPRESSION:  1. Bilateral hip flexion contracture.  2. Lumbar spondylosis with low back pain.  3. Facet arthropathy L4-5, L5-S1.  4. Bilateral knee osteoarthritis.  5. Bilateral lower extremity lymphedema.  6. Intermittent mild shoulder rotator cuff tendonitis, not a problem      at this time.   MEDICAL PROBLEMS:  Diabetes, hypothyroidism, hypertension, and  hypercholesterolemia.   PLAN:  We will start her physical therapy to address bilateral hip  flexion contractures, tension, and precautions to lumbar spine and  knees, have her seen 2 times a week to address flexibility issues, would  like to provide home program as well.  We will refill  her Norco 10/325  one p.o. t.i.d. p.r.n. back or knee pain #90, MS Contin 15 mg 1 p.o.  t.i.d. #90, no refills.  We will trial her on Neurontin at night to try  to address her cramping lower extremities at night and 300 mg 1 p.o.  nightly x1 week and 1-2 p.o. nightly #50 with a refill.  I will see her  back in a month.  She is stable on the above medications.  Anticipate  urine drug screen at next visit to continue to monitor opioid use.  She  has not displayed any aberrant behavior with current dosages.           ______________________________  Brantley Stage, M.D.     DMK/MedQ  D:  02/04/2009 13:46:40  T:  02/05/2009 01:45:19  Job #:  [id number]   cc:   Deirdre Peer. Polite, M.D.

## 2011-03-15 NOTE — Assessment & Plan Note (Signed)
Theresa Huffman is a 65 year old married female who is morbidly obese and  followed in our Pain and Rehabilitative Clinic for multiple chronic pain  complaints which include low back pain, bilateral knee pain, lymphedema  with bilateral lower extremity pain, and is known to have lumbar facet  arthropathy.   She is back in today for refill of her MS Contin, her ibuprofen, and her  Norco.   She states that she has followed up with Deirdre Peer. Polite, M.D. and was  told that she has a new diagnosis of diabetes mellitus.  She has been  placed on some new medication.   He has also ordered several tests including a sleep apnea study and a  colonoscopy which have not yet been scheduled.   She states that her average pain in the low back and legs are about an 8  on a scale of 10, knees particularly bothered her as well, especially  when she is up walking.  The pain is typically worse with activity  including walking, bending, and standing, improves with rest and  medication.   She gets fair relief with current medicines.  Her pain is described as  constant, dull, aching, and burning in nature especially burning below  the knees.   She is able to walk about 10 minutes at a time.  She does not climb  stairs.  She is able to drive.  She uses Lofstrand crutches to ambulate.   She is independent with self-care and needs some assistance with higher  level activities.  Denies problems controlling bowel or bladder.  Denies  anxiety.  Does admit to some depression, but denies suicidal ideation.   No changes regarding review of systems today.   SOCIAL HISTORY:   FAMILY HISTORY:  Otherwise unchanged.   PAST MEDICAL HISTORY:  Changes are noted in history.   MEDICATIONS:  Provided by this clinic includes:  1. Norco 10/325 t.i.d. p.r.n.  2. Ibuprofen 800 mg b.i.d. p.r.n. to t.i.d. p.r.n.  3. Prilosec 20 mg daily.  4. MS Contin 15 mg two tablets in the morning and one q.p.m. and one      nightly.  5. Topamax 2 mg two tablets b.i.d.   PHYSICAL EXAMINATION:  VITAL SIGNS:  Blood pressure 161/75, pulse 118,  respirations 24, and 98% saturated on room air.  GENERAL:  The patient is a morbidly female who appears her stated age  and does not appear in any distress.  NEUROLOGY:  She is oriented x3.  Her speech is clear.  Her affect is  bright.  She is alert, cooperative, and pleasant.  She follows commands  without difficulty.  She walks with a four-point gait with Lofstrand  crutches.  She has some difficulty walking without the crutches.  She  has a rather wide based gait, short stride length, otherwise.  She has  good strength in the lower extremities, 5/5 at hip flexors, knee  extensors, dorsiflexors, plantar flexors.   Her lymphedema is significantly improved today on examination. Just  about 10 cm above her ankles she does have some skin changes.  However,  the rest of her calf from below the knee is much improved.  Reflexes are  overall diminished in the lower extremities.  Her sensation is intact.  Coordination and balance are within normal limits as well.  Lower  extremity tone is normal.  No clonus is appreciated.   IMPRESSION:  1. Lumbago.  2. Lumbar spondylosis.  3. Lumbar facet arthropathy L4-5 and L5-S1.  4. Bilateral knee osteoarthritis.  5. Bilateral lower extremity edema, overall improved within the last      month.   MEDICAL PROBLEMS:  Include new diagnosis of diabetes mellitus,  hypothyroidism currently on replacement, hypertension,  hypercholesterolemia, currently being worked up for sleep apnea.   PLAN:  We will refill the following medications for her today:  Ibuprofen 800 mg one p.o. b.i.d. to t.i.d. #90 with 2 refills, Norco  10/325 one p.o. t.i.d. p.r.n. back or knee pain #90, MS Contin 15 mg 2  p.o. q.a.m. and 1 p.o. q.p.m. and 1 p.o. nightly #120.  I will see her  back in 2 months, nursing visit next month for refill of her  medications.   She has  been taking her medication as written.  She does not exhibit any  aberrant behavior and she is able to maintain a relatively functional  lifestyle despite some significant physical impairments.           ______________________________  Brantley Stage, M.D.     DMK/MedQ  D:  03/19/2008 12:08:51  T:  03/19/2008 12:50:44  Job #:  [id number]

## 2011-03-15 NOTE — Assessment & Plan Note (Signed)
Theresa Huffman is a 65 year old morbidly obese married female who  is followed in our Pain and Rehabilitative Clinic for multiple chronic  pain complaints including low back pain and bilateral knee pain.  She  also has lower extremity lymphedema and is known to have lumbar facet  arthropathy and lumbago.  She has had some intermittent problems with  shoulder pain in the last few months.   She comes into the clinic today with complaints of numbness and tingling  for about 2 to 3 weeks noted in the left upper extremity.  She states  that this occurred one time per week lasting not more than 5 minutes.   Her average pain is about 8 on a scale of 10.  Currently, low back area  is aching and occasionally burning in nature, occasionally some tingling  into the lower extremity, right greater than left as well.   Sleep seems to be fair.   She gets fair relief from current meds provided through this clinic.   MEDICATIONS FROM THIS CLINIC:  1. Norco 10/325 mg on a p.r.n. basis t.i.d.  2. MS Contin 15 mg 2 tablets in the morning, 1 in afternoon, and 1 at      night.  3. P.r.n. ibuprofen 800 mg on t.i.d. basis.  4. Omeprazole 20 mg daily.   She reports no side effects or problems from these medications  currently.   FUNCTIONAL STATUS:  Theresa Huffman can walk about 10 minutes at a time.  She uses forearm crutches due to lower extremity pain, especially in the  knees.  She does not climb stairs.  She does drive.  She is independent  with self-care.   REVIEW OF SYSTEMS:  Noncontributory, other than the numbness and  tingling in the lower extremities.   No changes in past medical, social, or family history since last visit.   PHYSICAL EXAMINATION:  VITAL SIGNS:  Blood pressure is 154/84, pulse 89,  respirations 18, 94% saturated on room air.  GENERAL:  She is morbidly obese female who appears her stated age and  does not appear in any distress.  She is oriented x3.  Her speech is  clear.  Her affect is bright.  She is alert, cooperative and pleasant.  She follows commands without any difficulty, and she answers questions  appropriately.  NEUROLOGIC:  Cranial nerves and coordination are grossly intact.  EXTREMITIES:  Her reflexes are diminished in the lower extremities.  No  abnormal tone is noted.  No clonus is noted.  No tremors are noted.  Toes are down going.  She has some limitations in external rotation of  her shoulders, especially on the left.  Lower extremity lymphedema seems  to be somewhat improved from last month.  Overall balance is good.  She  is able ambulate without difficulty using her Lofstrand crutches.  She  has a wide base of support during gait cycle and short stride length.   IMPRESSION:  1. Lumbar spondylosis with low back pain.  2. Facet arthropathy L4-5, L5-S1.  3. Bilateral knee osteoarthritis.  4. Bilateral lower extremity edema.  5. Shoulder mild rotator cuff tendonitis, left greater than right.   MEDICAL PROBLEMS:  Diabetes, hypothyroidism, hypertension,  hypocholesteremia.   PLAN:  Refilled the following medications for her today Norco 10/325 mg  1 p.o. t.i.d. p.r.n. back or leg pain #90 and ibuprofen 800 mg 1 p.o.  b.i.d. to t.i.d. p.r.n. back pain #90 with 2 refill.  MS Contin 15 mg 2  tablets p.o. q.a.m., 1 p.o. q.p.m., and 1 p.o. at bedtime, no refills.  Omeprazole 20 mg 1 p.o. daily #30 with 3 refills.   In January at her next visit we would like to get her set up for some  physical therapy to address her shoulders as she has most likely some  mild rotator cuff impingement.  She is putting quite a bit of stress to  her shoulders using her forearm crutches to take pressure off of her  arthritic knees.  We would like to have her strengthen rotator cuff  musculature as well the scapular depressors.  We will see her back in a  month.  Theresa Huffman has been stable on the above medications.  No  aberrant behavior has been noted.   We will check a urine drug screen  routinely today.           ______________________________  Brantley Stage, M.D.     DMK/MedQ  D:  10/10/2008 08:21:53  T:  10/10/2008 11:15:31  Job #:  [id number]

## 2011-03-15 NOTE — Assessment & Plan Note (Signed)
Theresa Huffman is a 65 year old woman who is followed in our [facility] for chronic low back pain and bilateral lower  extremity pain.   She has a history of lumbar spondylosis, bilateral lymphedema, and new  diagnosis of diabetes.  She has had some intermittent shoulder problems  in the past as well.   She is back in today.  She was last seen by me on October 03, 2008.  She  had a nursing visit for refill of her medications on November 04, 2008,  and she missed her appointment on December 03, 2008.   She called in on December 04, 2008, requesting appointment.  She states  that she did not get her medications last month and subsequently had  some withdrawal symptoms.  The withdrawal symptoms manifested by her  being somewhat irritable, restless, and achier than usual.   She did not bring her bottles in today for pill count.  She states they  were empty.  Her average pain is about 9 on a scale of 10, mainly  localized in her low back, bilateral lower extremities including the  knee.   Sleep tends to be poor.  She gets good relief with medications when she  takes them.   Pain is described as constant, aching, and burning in nature.   FUNCTIONAL STATUS:  She is able to walk about 10 minutes at a time.  She  has difficulty with stairs.  She is able to drive.  She uses forearm  crutches for ambulation.   She is independent with self-care, eating, dressing, bathing, and  toileting; needs assistance with heavier household tasks.   Admits to some difficulty walking and depression, denies harm to self or  others.  Review of systems is otherwise negative.   Her primary care physician is Dr. Nehemiah Settle.  She has an appointment with  him in about 2 weeks.  No other changes regarding past medical, social,  or family history since last visit.   Current medications she is taking at this time, she states she took her  last hydrocodone this morning.   She is completely out of MS  Contin.  She had been taking 2 tablets of 15  mg in the morning, one at the p.m. and 1 at bedtime.  She does take  ibuprofen 800 mg 3 times a day as well as omeprazole.  She denies any  problems with abdominal complaints with the use of the ibuprofen.  Reports overall good relief with pain when she does use it.   PHYSICAL EXAMINATION:  Today, blood pressure is 159/96, pulse 113,  respirations 20, and 94% saturated on room air.  She is a morbidly obese  woman who appears as stated age.  Does not appear in any distress.   She is oriented x3.  Speech is clear.  Her affect is bright.  She is  alert, cooperative, and pleasant.  She follows commands without any  difficulty.  She answers questions appropriately.   Cranial nerves are grossly intact.  Coordination is intact.  Reflexes  are diminished in the lower extremities.  Her motor strength is good at  hip flexors, knee extensors, dorsiflexors, and plantar flexors.  She has  just minimal lymphedema below the knees.  Tender at the joint line,  difficult to appreciate an effusion.   She is able to transition from sitting to standing slowly.  Her gait in  the room, displays a wide-based gait, short-stride length, rather a flat-  foot gait.   She does use one Lofstrand crutch to ambulate in the room today.   Limitations are noted in lumbar motion in all planes.   IMPRESSION:  1. Lumbar spondylosis with low back pain.  2. Facet arthropathy L4-5 and L5-S1.  3. Bilateral knee osteoarthritis.  4. Bilateral lower extremity lymphedema.  5. Intermittent mild shoulder rotator cuff tendinitis, left greater      than right, no major problem at this time.   Medical problems include diabetes, hypothyroidism, hypertension, and  hypercholesterolemia.   PLAN:  Urine drug screen was reviewed with her, this was done back in  December.  She missed her appointment with me last month, so I am  reviewing it with her now.   She has been off her morphine  now for about a month.  We will start her  back on it but at a decreased frequency.  We will start her on MS Contin  15 mg 1 p.o. b.i.d. for 1 week, then t.i.d., Norco 10/325, 1 p.o. t.i.d.  p.r.n. back pain and leg pain, #90.  We will check urine drug screen at  the next visit.           ______________________________  Brantley Stage, M.D.     DMK/MedQ  D:  12/29/2008 14:08:17  T:  12/30/2008 04:24:58  Job #:  [id number]

## 2011-03-18 NOTE — Group Therapy Note (Signed)
MEDICAL RECORD NUMBER:  [id number]   DATE OF BIRTH:  1946-04-13   HISTORY OF PRESENT ILLNESS:  Theresa Huffman is a 65 year old married female who  is being seen in our pain and rehabilitation clinic for multiple pain  complaints.   She was referred by Dr. Olena Leatherwood.   [name] main complaint is low back pain, which she states she has had  between 10 and 15 years, however, became much worse in approximately 2001.  She states she had a work injury in 2001 while she was working at [employer]; she picked up a case that contained Gatorade and felt a  pop or pull in her low back at that point.   She has been seen by multiple physicians over the past few years and also  has been in Dr. Vear Clock' pain clinic here in town.   [name] is really unable to verbalize which medication she has been  trialed on in the past; she believes she may have been on Duragesic in the  past.  She also has trialed Topamax in the past and tells me she had some  benefit from that medication.   Her other pain complaints include cervicalgia; she states it is about a 3 on  a scale of 10.  She also has bilateral knee pain, worse on the right than on  the left.  The right knee is about a 6 on a scale of 10; the left is about a  4 on a scale of 10.  She states she has seen orthopedists in the past and  has had multiple injections; typically, they do not last much more than a  week at a time.  She apparently has been told she needs a knee replacement  on the right.   She also has complaints of pain in the right lateral hip which is worse when  she is up and walking around or if she tries to lie on that side.   She has bilateral leg pain which she attributes to quite a bit of edema in  both lower extremities.   Her pain in the lower extremities below the knee is about a 7 on a scale of  10.   She states her sleep overall is poor.  She is currently getting between fair  and good relief with the  pain medications that she is prescribed through Dr.  Olena Leatherwood.   PAIN MANAGEMENT:  For pain management, she is currently on these medications  as follows:  1.  Flexeril 5 mg one p.o. 3 times daily.  2.  Ultram ER 300 mg one p.o. daily.  3.  Ibuprofen 800 mg one p.o. 3 times daily.  4.  Gabapentin 300 mg one p.o. 3 times daily.  5.  Oxycodone extended released 40 mg one p.o. twice daily.   FUNCTIONAL STATUS:  Her back pain is typically worse when she is up standing  or trying to lie flat, improved when she is sitting.  The pain in her legs  is fairly constant, especially below the knees.  Cervical pain comes and  goes, typically stays around a 3 on a scale of 10.  Knee pain is worse when  she is up walking.   She is able to walk about 10 minutes at a time.  She is able to climb stairs  if they are fairly low.  She is currently able to drive.  She uses a cane to  ambulate.  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   She is functionally independent with her self-care and does help with higher-  level such as meal prep, hospital duties and shopping.   REVIEW OF SYSTEMS:  Review of systems is reviewed on the health and history  form.   PAST MEDICAL HISTORY:  Her past medical history is remarkable for:  1.  History of hypothyroidism.  2.  High blood pressure.   PAST SURGICAL HISTORY:  1.  Tubal ligation, 1975.  2.  Gallbladder removal, 1976.   SOCIAL HISTORY:  The patient is married, lives with her husband and also a  21 year old grandchild.  She denies illicit drug use.  She denies alcohol  use.  She denies smoking.   FAMILY HISTORY:  Family history remarkable for mother who died at age 60  from lung cancer.  Father died in his 48s, unsure.  Brother 67 and sister 88  both died of myocardial infarctions.   PHYSICAL EXAMINATION:  VITAL SIGNS:  Blood pressure 148/69, pulse 124,  respirations 20, saturated 94% on room air.  GENERAL:  She is a morbidly obese  female who appears her stated age.  She is  oriented x3.  Her affect is bright and alert.  She is cooperative and  pleasant.  NEUROMUSCULAR:  She is able to stand up for me with some trouble.  Her gait  in the room is wide-based and lacks normal heel-toe mechanics, more of a  flatfoot gait overall.  Stride length is also quite short.  She is able to  flex forward somewhat, reports some pain with this; however, extension is  much more painful for her.  Seated reflexes are diminished with patellar  tendons.  She was unable to sit on the exam table for me, so my exam was  performed while she was sitting in a chair in the exam room.  She had 5/5  strength at her hip flexors, knee extensors, and dorsiflexors.  There is  quite a bit of excess tissue around the knee joint, making it difficult to  assess AP or mediolateral instability well or assess for any obvious fluid  on the joint.  EXTREMITIES:  She had pitting edema up to the knees and skin changes  throughout both lower extremities secondary to edema.   IMAGING STUDIES:  Lumbar MRI was attached to the chart, significant for  multilevel degenerative disk disease and facet arthropathy at L4-5 and L5-S1  bilaterally, moderate in extent, central or leftward protrusion noted at L5-  S1, which was noncompressive.   IMPRESSION:  1.  Lumbago.  2.  Degenerative disk disease.  3.  Facet arthropathy, L4-5, L5-S1.  4.  Right hip pain, most possibly secondary to trochanteric bursitis.  5.  Bilateral lower extremity lymphedema secondary to obesity.  6.  Right knee osteoarthritis and right knee pain greater than left knee      pain.   PLAN:  I would like to obtain previous records from pain management at Dr.  Vear Clock' office; the patient will provide this for me.  We are currently  waiting for a urine drug screen today.  We will have her wean off of Neurontin, start her on Topamax; she has been on this before and notes that  it was of benefit to  her; we will start her at 25 mg one p.o. twice daily  after she weans off of her Neurontin; weaning schedule was discussed with  her.   We  will also trial her on Lidoderm 1-3 patches 12 hours on, 12 hours, #90,  three refills given.  I discussed the possibility of doing facet blocks with  her.  She would consider this intervention as well.  Next visit, we will  discuss her narcotics further with her.  Once she gets the notes from Dr.  Vear Clock, we would like to see what she has trialed in the past.  It sounds  like she has been on fentanyl; she believes it may have provided her a bit  more relief than the oxycodone that she is currently taking.  We will review  risks and benefits of these various medications with her.  We would like to  get more of an idea of what she has been on.  She is also going  to look in  her cabinets at home and write down what she has also trialed in the past.   Regarding her bilateral lower extremity edema, I have written a prescription  out for her to attend lymphedema therapy program through the physical  therapy department at [facility].  Her husband will attend these sessions  and learn now to help control her lower extremity edema with wrapping and  possibly a garment.  We will see her back in a month.           ______________________________  Brantley Stage, M.D.     DMK/MedQ  D:  01/16/2006 13:29:00  T:  01/17/2006 11:47:21  Job #:  [id number]   cc:   Ladell Pier, M.D.  Fax: [phone]

## 2011-04-05 ENCOUNTER — Other Ambulatory Visit: Payer: Self-pay | Admitting: *Deleted

## 2011-04-05 ENCOUNTER — Encounter: Payer: Self-pay | Admitting: Family Medicine

## 2011-04-05 ENCOUNTER — Encounter: Payer: Medicare Other | Attending: Neurosurgery | Admitting: Neurosurgery

## 2011-04-05 DIAGNOSIS — M47817 Spondylosis without myelopathy or radiculopathy, lumbosacral region: Secondary | ICD-10-CM | POA: Insufficient documentation

## 2011-04-05 DIAGNOSIS — M1610 Unilateral primary osteoarthritis, unspecified hip: Secondary | ICD-10-CM

## 2011-04-05 DIAGNOSIS — M545 Low back pain, unspecified: Secondary | ICD-10-CM | POA: Insufficient documentation

## 2011-04-05 DIAGNOSIS — G8929 Other chronic pain: Secondary | ICD-10-CM | POA: Insufficient documentation

## 2011-04-05 DIAGNOSIS — IMO0001 Reserved for inherently not codable concepts without codable children: Secondary | ICD-10-CM | POA: Insufficient documentation

## 2011-04-05 DIAGNOSIS — M1710 Unilateral primary osteoarthritis, unspecified knee: Secondary | ICD-10-CM

## 2011-04-05 DIAGNOSIS — M159 Polyosteoarthritis, unspecified: Secondary | ICD-10-CM | POA: Insufficient documentation

## 2011-04-05 DIAGNOSIS — M79609 Pain in unspecified limb: Secondary | ICD-10-CM | POA: Insufficient documentation

## 2011-04-05 DIAGNOSIS — G894 Chronic pain syndrome: Secondary | ICD-10-CM

## 2011-04-05 LAB — HM PAP SMEAR

## 2011-04-05 MED ORDER — METFORMIN HCL 500 MG PO TABS: 1000.0000 mg | ORAL_TABLET | Freq: Two times a day (BID) | ORAL | Status: DC

## 2011-04-06 ENCOUNTER — Ambulatory Visit (INDEPENDENT_AMBULATORY_CARE_PROVIDER_SITE_OTHER): Payer: Medicare Other | Admitting: Family Medicine

## 2011-04-06 ENCOUNTER — Encounter: Payer: Self-pay | Admitting: Family Medicine

## 2011-04-06 VITALS — BP 130/80 | HR 128 | Temp 97.8°F | Wt >= 6400 oz

## 2011-04-06 DIAGNOSIS — J069 Acute upper respiratory infection, unspecified: Secondary | ICD-10-CM

## 2011-04-06 DIAGNOSIS — E119 Type 2 diabetes mellitus without complications: Secondary | ICD-10-CM

## 2011-04-06 LAB — HEMOGLOBIN A1C: Hgb A1c MFr Bld: 8.5 % — ABNORMAL HIGH (ref 4.6–6.5)

## 2011-04-06 MED ORDER — AZITHROMYCIN 250 MG PO TABS: ORAL_TABLET | ORAL | Status: AC

## 2011-04-06 NOTE — Assessment & Plan Note (Signed)
ACCOUNT:  [id number].  Theresa Huffman is a patient of Dr. Pamelia Hoit who is followed for low back pain and leg pain.  The patient states his problems are unchanged.  Theresa Huffman does have chronic pain in the lower extremities and uses an electric wheelchair to get around for the most part.  Theresa Huffman is walking today in the exam room with forearm-braced single point cane.  Theresa Huffman rates Theresa Huffman average pain at about 7, it is a burning and aching pain. Activity level is unchanged at about 7 to 9.  Pain is the same 24 hours a day.  Sleep pattern is fair.  Pain is worse with most activities and standing.  Rest and medication tend to help.  Mobility, Theresa Huffman uses either on crutches or in the electric chair.  Theresa Huffman can drive.  Theresa Huffman does not climb steps.  Theresa Huffman is on disability.  REVIEW OF SYSTEMS:  Notable for those difficulties as well as trouble with ambulation and weakness.  PAST MEDICAL HISTORY:  Unchanged at this point.  SOCIAL HISTORY:  Unchanged.  FAMILY HISTORY:  No changes with family history.  PHYSICAL EXAMINATION:  VITAL SIGNS:  Blood pressure is 153/62, pulse 107, respirations 18, O2 sats 91% on room air. NEUROLOGIC:  Theresa Huffman motor strength, sensation is diminished in the lower extremities.  Theresa Huffman walks with a kyphotic motion with Theresa Huffman cane and appears somewhat unstable.  Reflexes are unchanged, 1-2 in the upper extremities, not examined in the lower extremities due to Theresa Huffman size and positioning in the chair.  ASSESSMENT: 1. Chronic low back pain. 2. Spondylosis, lumbar spine. 3. Bilateral hip osteoarthritis. 4. Bilateral knee osteoarthritis. 5. Sometimes lymphedema in the lower extremities. 6. Diabetes, uncontrolled.  PLAN:  We went ahead and refilled her MS Contin 50 mg 1 p.o. q.8 h, 90 with no refill and Norco 10/325 one p.o. t.i.d., 90 with no refill.  Theresa Huffman is to see Dr. Pamelia Hoit the first month.  Theresa Huffman is scheduled for hip and knee x-rays according to the patient that Dr. Pamelia Hoit is ordered and Theresa Huffman is  coming back to review those with Theresa Huffman questions.  Theresa Huffman questions were encouraged and answered.     Sarin Comunale L. Blima Dessert Electronically Signed    RLW/MedQ D:  04/05/2011 12:56:14  T:  04/06/2011 02:49:19  Job #:  [id number]

## 2011-04-06 NOTE — Progress Notes (Signed)
65 yo here for follow up DM and cough.   DM- DM remains poorly controlled.  Lab Results  Component Value Date   HGBA1C 7.8* 11/08/2010      Checks sugars once day, they have been running a bit better than they were last time we checkked.  120s- 150s.  Denies any symptoms of hypogylcemia.  Takes Metfromin 500 mg two times a day and Glyburide 2.5 mg two times a day.  Added Januvia 100 mg daily in November.  Cough-  Started with a dry cough 2 weeks ago with runny nose.  Felt like she was getting better until two days ago, wheezing worsened, cough productive.  No fevers, chills, SOB or CP.    The PMH, PSH, Social History, Family History, Medications, and allergies have been reviewed in [facility], and have been updated if relevant.   Review of Systems       See HPI ENT:  Denies decreased hearing. CV:  Denies chest pain or discomfort, shortness of breath with exertion, swelling of feet, and swelling of hands. Endo:  Denies polyuria.  Physical Exam BP 130/80  Pulse 128  Temp(Src) 97.8 F (36.6 C) (Oral)  Wt 425 lb 8 oz (193.006 kg)  General:  morbidily obese, NAD. Mouth:  MMM, pos erthythema, no exudate.  Nose:  Erythema, boggy turbinates Resp:  Distant breath sounds, no crackles or wheezes Extremities:  Venous status, LE bilaterally 1+, moderate lymphedema bilaterally Psych:  Oriented X3, memory intact for recent and remote, and normally interactive.    A/P- 1.  URI- given history, body habitus and duration of symptoms, will treat for bacterial infection with Zpack. Continue supportive care as per pt instructions. Has erythromycin allergy but per pt can and has tolerated Zpack.  2.  DM- unchanged. Recheck a1c today.

## 2011-04-07 ENCOUNTER — Other Ambulatory Visit: Payer: Self-pay | Admitting: Family Medicine

## 2011-04-07 ENCOUNTER — Other Ambulatory Visit: Payer: Self-pay | Admitting: *Deleted

## 2011-04-07 DIAGNOSIS — E119 Type 2 diabetes mellitus without complications: Secondary | ICD-10-CM

## 2011-04-07 MED ORDER — METFORMIN HCL 1000 MG PO TABS: ORAL_TABLET | ORAL | Status: DC

## 2011-04-18 ENCOUNTER — Other Ambulatory Visit: Payer: Self-pay | Admitting: *Deleted

## 2011-04-18 MED ORDER — POTASSIUM CHLORIDE CRYS ER 20 MEQ PO TBCR: 20.0000 meq | EXTENDED_RELEASE_TABLET | Freq: Every day | ORAL | Status: DC

## 2011-04-18 MED ORDER — CYCLOBENZAPRINE HCL 5 MG PO TABS: ORAL_TABLET | ORAL | Status: DC

## 2011-04-29 ENCOUNTER — Encounter: Payer: Medicare Other | Attending: Neurosurgery | Admitting: Neurosurgery

## 2011-04-29 DIAGNOSIS — M1610 Unilateral primary osteoarthritis, unspecified hip: Secondary | ICD-10-CM | POA: Insufficient documentation

## 2011-04-29 DIAGNOSIS — M479 Spondylosis, unspecified: Secondary | ICD-10-CM | POA: Insufficient documentation

## 2011-04-29 DIAGNOSIS — M1710 Unilateral primary osteoarthritis, unspecified knee: Secondary | ICD-10-CM | POA: Insufficient documentation

## 2011-04-29 DIAGNOSIS — IMO0001 Reserved for inherently not codable concepts without codable children: Secondary | ICD-10-CM | POA: Insufficient documentation

## 2011-04-29 DIAGNOSIS — M1909 Primary osteoarthritis, other specified site: Secondary | ICD-10-CM

## 2011-04-29 DIAGNOSIS — M5430 Sciatica, unspecified side: Secondary | ICD-10-CM

## 2011-04-29 DIAGNOSIS — M1991 Primary osteoarthritis, unspecified site: Secondary | ICD-10-CM

## 2011-04-29 DIAGNOSIS — M545 Low back pain, unspecified: Secondary | ICD-10-CM | POA: Insufficient documentation

## 2011-04-29 DIAGNOSIS — M79609 Pain in unspecified limb: Secondary | ICD-10-CM | POA: Insufficient documentation

## 2011-04-29 DIAGNOSIS — M169 Osteoarthritis of hip, unspecified: Secondary | ICD-10-CM | POA: Insufficient documentation

## 2011-04-30 NOTE — Assessment & Plan Note (Signed)
This is a patient of Dr. Pamelia Hoit, who was unavailable to see Dr. Pamelia Hoit for appointment this week, came in for medicine refills.  She reports no change in her condition since the last time she was here. She still rates her pain at about 8-9/10.  Her lower extremities and low back, knees down the feet are included.  Activity level is about an 8. She walks with arm brace crutches.  She can only walk about 10 minutes. She does drive.  She can climb steps.  REVIEW OF SYSTEMS:  Notable for those difficulties as well as depression, trouble with ambulation.  She has no suicidal thoughts or aberrant behaviors.  PAST MEDICAL HISTORY:  Unchanged.  SOCIAL HISTORY:  Unchanged.  FAMILY HISTORY:  Unchanged.  PHYSICAL EXAMINATION:  VITAL SIGNS:  Blood pressure 149/72, pulse 115, respirations 26, O2 sats 91 on room air. MUSCULOSKELETAL:  Motor strength is fair in the lower extremities.  She does give-way pain.  Sensation appears to be intact.  GENERAL: Constitutionally, she is morbidly obese.  She is in a wheelchair, most of time she ambulates with the arm brace crutches, oriented x3.  Affect is alert, seems somewhat depressed.  ASSESSMENT:  Chronic low back pain with lower extremity involvement, spondylosis, bilateral hip osteoarthritis, bilateral knee osteoarthritis, diabetes, uncontrolled.  PLAN:  I went ahead and refilled her MS Contin 15 mg one p.o. q.8, 90 with no refill, and Norco 10/325 one p.o. t.i.d. 90 with no refill.  She will follow up with Dr. Leretha Dykes clinic in 1 month.  Her questions were encouraged and answered.     Damain Broadus L. Blima Dessert Electronically Signed    RLW/MedQ D:  04/29/2011 12:56:13  T:  04/30/2011 02:19:27  Job #:  [id number]

## 2011-05-02 ENCOUNTER — Encounter: Payer: Medicare Other | Admitting: Physical Medicine and Rehabilitation

## 2011-05-13 ENCOUNTER — Other Ambulatory Visit: Payer: Self-pay | Admitting: *Deleted

## 2011-05-13 MED ORDER — CYCLOBENZAPRINE HCL 5 MG PO TABS: ORAL_TABLET | ORAL | Status: DC

## 2011-05-23 ENCOUNTER — Other Ambulatory Visit: Payer: Self-pay | Admitting: *Deleted

## 2011-05-23 MED ORDER — GLYBURIDE 2.5 MG PO TABS: ORAL_TABLET | ORAL | Status: DC

## 2011-05-31 ENCOUNTER — Encounter: Payer: Self-pay | Admitting: Family Medicine

## 2011-05-31 ENCOUNTER — Ambulatory Visit (INDEPENDENT_AMBULATORY_CARE_PROVIDER_SITE_OTHER): Payer: Medicare Other | Admitting: Family Medicine

## 2011-05-31 VITALS — BP 132/84 | HR 136 | Temp 98.7°F | Wt >= 6400 oz

## 2011-05-31 DIAGNOSIS — J209 Acute bronchitis, unspecified: Secondary | ICD-10-CM

## 2011-05-31 DIAGNOSIS — B359 Dermatophytosis, unspecified: Secondary | ICD-10-CM

## 2011-05-31 MED ORDER — CLOTRIMAZOLE 1 % EX CREA: TOPICAL_CREAM | Freq: Two times a day (BID) | CUTANEOUS | Status: DC

## 2011-05-31 MED ORDER — ALBUTEROL SULFATE HFA 108 (90 BASE) MCG/ACT IN AERS: 2.0000 | INHALATION_SPRAY | RESPIRATORY_TRACT | Status: DC | PRN

## 2011-05-31 MED ORDER — AZITHROMYCIN 250 MG PO TABS: ORAL_TABLET | ORAL | Status: AC

## 2011-05-31 NOTE — Progress Notes (Signed)
65 yo here for cough and ?ring worm.  Cough-  Started with a dry cough 2 weeks ago with runny nose.  Felt like she was getting better until two days ago, wheezing worsened, cough productive.   Had chills last night and felt feverish.  No CP. Always SOB when walking, feels it is no worse. Multiple sick contacts at home.  Ringworm- grand daughter had ringworm.  Ms. Rarick noticed a very itchy circular patch on her upper arms a few days ago that looks like her grand daughter's lesion.  The PMH, PSH, Social History, Family History, Medications, and allergies have been reviewed in [facility], and have been updated if relevant.   Review of Systems       See HPI ENT:  Denies decreased hearing. CV:  Denies chest pain or discomfort, shortness of breath with exertion, swelling of feet, and swelling of hands.  Physical Exam BP 132/84  Pulse 136  Temp(Src) 98.7 F (37.1 C) (Oral)  Wt 427 lb (193.686 kg)  General:  morbidily obese, NAD. Mouth:  MMM, pos erthythema, no exudate.  Nose:  Erythema, boggy turbinates Resp:  Distant breath sounds, no crackles or wheezes Extremities:  Venous status, LE bilaterally 1+, moderate lymphedema bilaterally Psych:  Oriented X3, memory intact for recent and remote, and normally interactive.   Skin:  Circular, elevated, erythematous lesion with hypopigmented center on left upper arm. No lesions evident in scalp. A/P- 1. Ringworm  New.  Clotrimazole cream to area twice daily. Discussed Ringworm precautions.  2. Acute bronchitis        New.  Given body habitus and multiple sick contacts, will treat again with Zpack and Proair. The patient indicates understanding of these issues and agrees with the plan.

## 2011-06-17 ENCOUNTER — Encounter
Payer: Medicare Other | Attending: Physical Medicine and Rehabilitation | Admitting: Physical Medicine and Rehabilitation

## 2011-06-17 DIAGNOSIS — R269 Unspecified abnormalities of gait and mobility: Secondary | ICD-10-CM | POA: Insufficient documentation

## 2011-06-17 DIAGNOSIS — M79609 Pain in unspecified limb: Secondary | ICD-10-CM | POA: Insufficient documentation

## 2011-06-17 DIAGNOSIS — M47817 Spondylosis without myelopathy or radiculopathy, lumbosacral region: Secondary | ICD-10-CM | POA: Insufficient documentation

## 2011-06-17 DIAGNOSIS — M24559 Contracture, unspecified hip: Secondary | ICD-10-CM | POA: Insufficient documentation

## 2011-06-17 DIAGNOSIS — Z79899 Other long term (current) drug therapy: Secondary | ICD-10-CM | POA: Insufficient documentation

## 2011-06-17 DIAGNOSIS — M25559 Pain in unspecified hip: Secondary | ICD-10-CM | POA: Insufficient documentation

## 2011-06-17 DIAGNOSIS — M169 Osteoarthritis of hip, unspecified: Secondary | ICD-10-CM | POA: Insufficient documentation

## 2011-06-17 DIAGNOSIS — M545 Low back pain, unspecified: Secondary | ICD-10-CM

## 2011-06-17 DIAGNOSIS — M1710 Unilateral primary osteoarthritis, unspecified knee: Secondary | ICD-10-CM

## 2011-06-17 DIAGNOSIS — G894 Chronic pain syndrome: Secondary | ICD-10-CM

## 2011-06-17 DIAGNOSIS — M1610 Unilateral primary osteoarthritis, unspecified hip: Secondary | ICD-10-CM | POA: Insufficient documentation

## 2011-06-17 DIAGNOSIS — I890 Lymphedema, not elsewhere classified: Secondary | ICD-10-CM | POA: Insufficient documentation

## 2011-06-17 DIAGNOSIS — M25569 Pain in unspecified knee: Secondary | ICD-10-CM | POA: Insufficient documentation

## 2011-06-17 NOTE — Assessment & Plan Note (Signed)
Theresa Huffman is a very pleasant 65 year old woman who is followed in our center for pain and rehabilitative medicine for chronic pain complaints related to her low back and her knees as well as lower extremities.  She was last seen by me in January of this year.  She has had multiple visits in the interim with nurse practitioner for refill of her pain medications.  She continues to use MS Contin and hydrocodone to help manage her low back and leg pain.  She is also on ibuprofen p.r.n. with omeprazole.  She continues to have chronic pain complaints averaging about 8 on a scale of 10.  Sleep tends be poor.  Pain is worse with walking, standing, and bending, improves with rest and medication.  Functional Status:  She can walk about 10 minutes at a time.  She had is unable to climb stairs.  She does drive.  She is independent with self- care.  With respect to review of systems she denies problems controlling bowel or bladder.  Denies any new numbness, tremors, tingling, dizziness, confusion, admits to some depression.  Denies suicidal ideation.  She reports no new weakness.  Past medical, social, and family history are otherwise unchanged.  Medications prescribed through center for pain include the following: 1. Hydrocodone 10/325 three times a day. 2. Ibuprofen 800 mg one to three times a day on a p.r.n. basis. 3. Omeprazole 20 mg daily. 4. MS Contin 50 mg one p.o. q.8 h.  PHYSICAL EXAMINATION:  VITAL SIGNS:  Today, her blood pressure is 162/86, pulse 112, respirations 20, 91% saturation on room air. GENERAL:  She is a well-developed, morbidly obese woman who does not appear in any distress.  She is oriented x3.  Her speech is clear. Affect is bright. NEUROLOGICAL:  She is alert, cooperative, and pleasant.  Follows commands without difficulty, answers my questions appropriately. Cranial nerves and coordination are intact.  MUSCULOSKELETAL:  Her reflexes are diminished in the  lower extremities.  There is no abnormal tone, clonus, or tremors.  Her motor strength is rather good at hip flexors, knee extensors, dorsiflexors, and plantar flexors.  No pain today with internal or external rotation at the hips.  Transitions from sitting to standing without difficulty.  Gait is with forearm crutches and is stable.  IMPRESSION: 1. Lumbago. 2. Lumbar spondylosis. 3. Bilateral hip flexion contractures. 4. History of bilateral knee osteoarthritis. 5. Bilateral lymphedema, right worse than left. 6. Bilateral hip pain not as much problem today. 7. Gait disorder secondary to pain and osteoarthritis.  PLAN:  We will continue current medications.  She is comfortable with the management plan find hydrocodone as well as the MS Contin and continues to be helpful in managing her pain.  She does use ibuprofen occasionally, 800 mg one to three times a day in addition to some omeprazole.  Her gait continues to be stable on the forearm crutches.  We will continue to follow her and monitor her hips as well.  I will see her back in 3 months' nursing visit with nurse practitioner over the next 2 months to monitor pill counts and for brief recheck.  I have answered all her questions.     Theresa Huffman, M.D. Electronically Signed    DMK/MedQ D:  06/17/2011 14:28:44  T:  06/17/2011 22:14:12  Job #:  [id number]

## 2011-06-20 ENCOUNTER — Other Ambulatory Visit: Payer: Self-pay | Admitting: *Deleted

## 2011-06-20 MED ORDER — LEVOTHYROXINE SODIUM 175 MCG PO TABS: 175.0000 ug | ORAL_TABLET | Freq: Every day | ORAL | Status: DC

## 2011-07-11 ENCOUNTER — Other Ambulatory Visit: Payer: Self-pay

## 2011-07-11 MED ORDER — CYCLOBENZAPRINE HCL 5 MG PO TABS: ORAL_TABLET | ORAL | Status: DC

## 2011-07-11 NOTE — Telephone Encounter (Signed)
[facility] faxed refill request Cyclobenzaprine 5 mg. Last refilled 05/14/11.Please advise.

## 2011-07-14 ENCOUNTER — Encounter: Payer: Medicare Other | Attending: Neurosurgery | Admitting: Neurosurgery

## 2011-07-14 DIAGNOSIS — M1710 Unilateral primary osteoarthritis, unspecified knee: Secondary | ICD-10-CM

## 2011-07-14 DIAGNOSIS — M5430 Sciatica, unspecified side: Secondary | ICD-10-CM

## 2011-07-14 DIAGNOSIS — M47817 Spondylosis without myelopathy or radiculopathy, lumbosacral region: Secondary | ICD-10-CM

## 2011-07-14 DIAGNOSIS — R269 Unspecified abnormalities of gait and mobility: Secondary | ICD-10-CM | POA: Insufficient documentation

## 2011-07-14 DIAGNOSIS — M24559 Contracture, unspecified hip: Secondary | ICD-10-CM | POA: Insufficient documentation

## 2011-07-14 DIAGNOSIS — M545 Low back pain, unspecified: Secondary | ICD-10-CM | POA: Insufficient documentation

## 2011-07-14 DIAGNOSIS — IMO0002 Reserved for concepts with insufficient information to code with codable children: Secondary | ICD-10-CM | POA: Insufficient documentation

## 2011-07-14 DIAGNOSIS — M542 Cervicalgia: Secondary | ICD-10-CM | POA: Insufficient documentation

## 2011-07-15 ENCOUNTER — Ambulatory Visit: Payer: Medicare Other | Admitting: Neurosurgery

## 2011-07-15 NOTE — Assessment & Plan Note (Signed)
This is a patient of Dr. Pamelia Hoit, is seen for cervicalgia as well as low back pain and lower extremity radiculopathy.  She has got a long history of spondylosis and lumbago.  She reports no change in her pain today.  She rates it at an 8, it is a burning aching type pain.  Her activity level is 7-9.  Pain is worse in the morning and evening.  Sleep patterns are poor.  Pain is worse with walking, bending, and standing. Rest and medication tend to help.  She walks with assistance.  She has crutches available.  She can walk about 10 minutes at a time.  She does not climb steps and she drives.  She is on disability.  She needs help with shopping, meal prep, and household duties.  REVIEW OF SYSTEMS:  Notable for difficulties as described above as well as some depression, trouble walking, otherwise within normal limits. There is no signs of aberrant behavior or suicidal ideation.  Her Oswestry score is 74.  Past medical history, social history, and family history are unchanged.  PHYSICAL EXAMINATION:  VITAL SIGNS:  Her blood pressure is 165/80, pulse 118, respirations 18, O2 sats 90 on room air. MUSCULOSKELETAL:  Motor strength, sensation is intact. NEUROLOGIC:  Constitutionally, she is obese.  She is alert and oriented x3.  She does walk with a cane.  ASSESSMENT: 1. Lumbago. 2. History of lumbar spondylosis. 3. History of hip flexion contractures. 4. Bilateral knee osteoarthritis. 5. Gait disorder.  PLAN: 1. Refill Norco 10/325 one p.o. t.i.d., 90 with no refill. 2. MS Contin 15 mg 1 p.o. q.8 hours, 90 with no refill.  Her questions     were encouraged and answered.  We will see her back here in the     clinic in 1 month.     Birch Farino L. Blima Dessert Electronically Signed    RLW/MedQ D:  07/14/2011 14:37:34  T:  07/14/2011 23:56:34  Job #:  [id number]

## 2011-07-27 ENCOUNTER — Ambulatory Visit (INDEPENDENT_AMBULATORY_CARE_PROVIDER_SITE_OTHER): Payer: Medicare Other | Admitting: Family Medicine

## 2011-07-27 ENCOUNTER — Encounter: Payer: Self-pay | Admitting: Family Medicine

## 2011-07-27 VITALS — BP 142/92 | HR 121 | Temp 97.8°F | Wt >= 6400 oz

## 2011-07-27 DIAGNOSIS — Z23 Encounter for immunization: Secondary | ICD-10-CM

## 2011-07-27 DIAGNOSIS — J069 Acute upper respiratory infection, unspecified: Secondary | ICD-10-CM

## 2011-07-27 MED ORDER — ALBUTEROL SULFATE HFA 108 (90 BASE) MCG/ACT IN AERS: 2.0000 | INHALATION_SPRAY | RESPIRATORY_TRACT | Status: DC | PRN

## 2011-07-27 MED ORDER — AZITHROMYCIN 250 MG PO TABS: ORAL_TABLET | ORAL | Status: AC

## 2011-07-27 NOTE — Patient Instructions (Signed)
Take antibiotic as directed.  Drink lots of fluids.  Treat sympotmatically with Mucinex, nasal saline irrigation, and Tylenol/Ibuprofen. Also try claritin D or zyrtec D over the counter- two times a day as needed ( have to sign for them at pharmacy). You can use warm compresses.  Cough suppressant at night. Call if not improving as expected in 5-7 days.    

## 2011-07-27 NOTE — Progress Notes (Signed)
SUBJECTIVE:  Theresa Huffman is a 65 y.o. female who complains of coryza, congestion, sneezing, sore throat, productive cough and wheezing for 7 days. She denies a history of chest pain, chills and fevers and does a history of asthma. Patient denies smoke cigarettes.   OBJECTIVE: BP 142/92  Pulse 121  Temp(Src) 97.8 F (36.6 C) (Oral)  Wt 422 lb (191.418 kg)  SpO2 95%  She appears well, vital signs are as noted. Ears normal.  Throat and pharynx normal.  Neck supple. No adenopathy in the neck. Nose is congested. Sinuses non tender. The chest is clear, without wheezes or rales.  Patient Active Problem List  Diagnoses  . HYPOTHYROIDISM  . DIABETES MELLITUS, TYPE II  . UNSPECIFIED VITAMIN D DEFICIENCY  . HYPERLIPIDEMIA  . OBESITY, MORBID  . HYPERTENSION  . LYMPHEDEMA, SEVERE  . UNSPECIFIED VENOUS INSUFFICIENCY  . ACUTE BRONCHITIS  . GERD  . Lumbago  . Ringworm  . URI (upper respiratory infection)   Past Medical History  Diagnosis Date  . Hyperlipidemia   . Hypertension   . Low back pain   . Morbid obesity   . Diabetes mellitus     type II  . Thyroid disease     hypothyroidism  . GERD (gastroesophageal reflux disease)   . Lymphedema    Past Surgical History  Procedure Date  . Cholecystectomy   . Tubal ligation    History  Substance Use Topics  . Smoking status: Never Smoker   . Smokeless tobacco: Not on file  . Alcohol Use: No   Family History  Problem Relation Age of Onset  . Cancer Mother     lung and colon   Allergies  Allergen Reactions  . Erythromycin     REACTION: Nausea and vomiting   Current Outpatient Prescriptions on File Prior to Visit  Medication Sig Dispense Refill  . aspirin 81 MG tablet Take 81 mg by mouth daily.        . Calcium Carbonate-Vitamin D 600-400 MG-UNIT per tablet 1200mg  once daily       . clotrimazole (LOTRIMIN) 1 % cream Apply topically 2 (two) times daily.  30 g  0  . cyclobenzaprine (FLEXERIL) 5 MG tablet Take one tablet  three times each day.  90 tablet  0  . furosemide (LASIX) 40 MG tablet Take 40 mg by mouth 2 (two) times daily.        Marland Kitchen gabapentin (NEURONTIN) 300 MG capsule Take two capsule by mouth three times daily       . glyBURIDE (DIABETA) 2.5 MG tablet Take two tablets by mouth 2 times daily  120 tablet  3  . HYDROcodone-acetaminophen (NORCO) 10-325 MG per tablet Take 1 tablet by mouth 3 (three) times daily.        Marland Kitchen ibuprofen (ADVIL,MOTRIN) 800 MG tablet Take 800 mg by mouth 3 (three) times daily.        Marland Kitchen levothyroxine (SYNTHROID, LEVOTHROID) 175 MCG tablet Take 1 tablet (175 mcg total) by mouth daily.  30 tablet  6  . metFORMIN (GLUCOPHAGE) 1000 MG tablet Take one tablet by mouth two times daily.  30 tablet  6  . moexipril (UNIVASC) 15 MG tablet Take 15 mg by mouth daily.        Marland Kitchen morphine (MSIR) 15 MG tablet Take 15 mg by mouth 3 (three) times daily.        . potassium chloride SA (K-DUR,KLOR-CON) 20 MEQ tablet Take 1 tablet (20 mEq total) by mouth  daily.  30 tablet  2  . simvastatin (ZOCOR) 40 MG tablet Take 40 mg by mouth daily.        . sitaGLIPtan (JANUVIA) 100 MG tablet Take 100 mg by mouth daily.        Marland Kitchen venlafaxine (EFFEXOR) 75 MG tablet Take 75 mg by mouth 3 (three) times daily.         The PMH, PSH, Social History, Family History, Medications, and allergies have been reviewed in [facility], and have been updated if relevant.  ASSESSMENT:   bronchitis  PLAN: Given duration and progression of symptoms, will treat for bacterial sinusitis.  Symptomatic therapy suggested: push fluids, rest and return office visit prn if symptoms persist or worsen.  Call or return to clinic prn if these symptoms worsen or fail to improve as anticipated.

## 2011-07-27 NOTE — Progress Notes (Signed)
Addended by: Gilmer Mor on: 07/27/2011 11:53 AM   Modules accepted: Orders

## 2011-08-03 MED ORDER — AZITHROMYCIN 250 MG PO TABS: ORAL_TABLET | ORAL | Status: DC

## 2011-08-03 NOTE — Progress Notes (Signed)
Addended by: Dianne Dun on: 08/03/2011 12:05 PM   Modules accepted: Orders

## 2011-08-08 ENCOUNTER — Other Ambulatory Visit: Payer: Self-pay | Admitting: *Deleted

## 2011-08-08 MED ORDER — VENLAFAXINE HCL 75 MG PO TABS: ORAL_TABLET | ORAL | Status: DC

## 2011-08-08 NOTE — Telephone Encounter (Signed)
Please call pt to verify dosage.

## 2011-08-08 NOTE — Telephone Encounter (Signed)
Rx sent to [facility] 

## 2011-08-08 NOTE — Telephone Encounter (Signed)
Spoke with patient and she stated that she takes three tablets by mouth every night at bedtime, update in Epic.  Please advise.

## 2011-08-08 NOTE — Telephone Encounter (Signed)
Ok to refill dose she is taking.

## 2011-08-08 NOTE — Telephone Encounter (Signed)
Received faxed refill request from pharmacy for Venlafaxine 75 mg, take three tablets by mouth every night at bedtime. This does not match the directions on the chart which shows three times a day. Please verify correct directions.

## 2011-08-10 ENCOUNTER — Ambulatory Visit: Payer: Medicare Other | Admitting: Physical Medicine and Rehabilitation

## 2011-08-12 ENCOUNTER — Encounter
Payer: Medicare Other | Attending: Physical Medicine and Rehabilitation | Admitting: Physical Medicine and Rehabilitation

## 2011-08-12 DIAGNOSIS — G894 Chronic pain syndrome: Secondary | ICD-10-CM

## 2011-08-12 DIAGNOSIS — M545 Low back pain, unspecified: Secondary | ICD-10-CM

## 2011-08-12 DIAGNOSIS — M24559 Contracture, unspecified hip: Secondary | ICD-10-CM | POA: Insufficient documentation

## 2011-08-12 DIAGNOSIS — M1710 Unilateral primary osteoarthritis, unspecified knee: Secondary | ICD-10-CM | POA: Insufficient documentation

## 2011-08-12 DIAGNOSIS — M47817 Spondylosis without myelopathy or radiculopathy, lumbosacral region: Secondary | ICD-10-CM | POA: Insufficient documentation

## 2011-08-12 DIAGNOSIS — G8929 Other chronic pain: Secondary | ICD-10-CM | POA: Insufficient documentation

## 2011-08-12 DIAGNOSIS — I890 Lymphedema, not elsewhere classified: Secondary | ICD-10-CM | POA: Insufficient documentation

## 2011-08-12 DIAGNOSIS — R269 Unspecified abnormalities of gait and mobility: Secondary | ICD-10-CM | POA: Insufficient documentation

## 2011-08-12 NOTE — Assessment & Plan Note (Signed)
[name] is a pleasant 65 year old woman who is followed here at [facility] for chronic pain complaints which are related predominantly to her low back and knees. She was last seen by me June 17, 2011.  In the interim, she has followed up with nurse practitioner for refills of her pain medications and opioid monitoring.  She reports her average pain continues to be about 7 or 8 on a scale of 10.  She gets fair relief with current meds.  She reports no new pain complaints or medical problems.  FUNCTIONAL STATUS:  She can walk 10 minutes with forearm crutches.  She is unable to climb stairs.  She does drive.  She is independent with self-care, needs assistance with heavier household tasks.  She is able to get out a few times a week.  She does go to [employer] and she does interact with her grandchildren.  REVIEW OF SYSTEMS:  Positive for trouble walking, depression.  Denies suicidal ideation.  No other changes regarding past medical, social, or family history.  PHYSICAL EXAMINATION:  Her blood pressure is 154/80, pulse 97, respiration 18, 93% saturated on room air.  She is a morbidly obese woman who does not appear in any distress.  She is oriented x3.  Speech is clear.  Affect is bright.  She is alert, cooperative, and pleasant. Follows commands without difficulty.  Answers my questions appropriately.  Cranial nerves coordination are grossly intact.  Her reflexes are diminished in the lower extremities.  Motor strength is quite good at hip flexors, knee extensors, dorsiflexors, plantar flexors.  She has no pain with internal or external rotation at the left hip.  She has some mild discomfort with internal rotation at the right hip.  No new numbness.  She has significant lymphedema in bilateral lower extremities.  Knees are evaluated.  She has significant excess tissue around the knee.  It is difficult to ascertain whether she has  effusion. Medial joint line tenderness is apparent.  Transitions from sitting to standing without difficulty.  Gait is with forearm crutches and is stable.  IMPRESSION: 1. Lumbago with known lumbar spondylosis. 2. Bilateral hip flexion contractures. 3. Bilateral knee osteoarthritis. 4. Bilateral lymphedema. 5. Gait disorder secondary to pain and osteoarthritis.  PLAN:  Last urine drug screen was June 17, 2011 and was consistent. Her pill counts were appropriate today.  She continues to use her opioid pain medication responsibly.  No aberrant behaviors appreciated.  She understands that there are risks with this medicine and is using it to maintain her current function.  She does not need a refill of Neurontin today, however, next month she will.  I refilled her MS Contin 15 mg 1 p.o. t.i.d. #90 as well as hydrocodone 10/325, 1 p.o. t.i.d. #90 as well.  I answered all her questions.  She is comfortable with our treatment plan.  She will follow up with nurse practitioner over the next 3 months.  I will see her back in 4 months.     Brantley Stage, M.D. Electronically Signed    DMK/MedQ D:  08/12/2011 13:52:36  T:  08/12/2011 19:32:26  Job #:  [id number]

## 2011-08-23 ENCOUNTER — Other Ambulatory Visit: Payer: Self-pay | Admitting: *Deleted

## 2011-08-23 MED ORDER — CYCLOBENZAPRINE HCL 5 MG PO TABS: ORAL_TABLET | ORAL | Status: DC

## 2011-08-23 MED ORDER — POTASSIUM CHLORIDE CRYS ER 20 MEQ PO TBCR: 20.0000 meq | EXTENDED_RELEASE_TABLET | Freq: Every day | ORAL | Status: DC

## 2011-08-23 NOTE — Telephone Encounter (Signed)
Last refill 07/11/2011.

## 2011-09-08 ENCOUNTER — Telehealth: Payer: Self-pay | Admitting: *Deleted

## 2011-09-08 NOTE — Telephone Encounter (Signed)
Form faxed to [facility] at [phone].

## 2011-09-08 NOTE — Telephone Encounter (Signed)
In my box

## 2011-09-08 NOTE — Telephone Encounter (Signed)
Form for diabetic supplies is on your desk.  I checked with the patient and she does want these supplies. 

## 2011-09-09 ENCOUNTER — Encounter: Payer: Medicare Other | Attending: Neurosurgery | Admitting: Neurosurgery

## 2011-09-09 DIAGNOSIS — R269 Unspecified abnormalities of gait and mobility: Secondary | ICD-10-CM | POA: Insufficient documentation

## 2011-09-09 DIAGNOSIS — M25569 Pain in unspecified knee: Secondary | ICD-10-CM | POA: Insufficient documentation

## 2011-09-09 DIAGNOSIS — M545 Low back pain, unspecified: Secondary | ICD-10-CM | POA: Insufficient documentation

## 2011-09-09 DIAGNOSIS — M24559 Contracture, unspecified hip: Secondary | ICD-10-CM | POA: Insufficient documentation

## 2011-09-09 DIAGNOSIS — M1710 Unilateral primary osteoarthritis, unspecified knee: Secondary | ICD-10-CM

## 2011-09-09 DIAGNOSIS — G8929 Other chronic pain: Secondary | ICD-10-CM | POA: Insufficient documentation

## 2011-09-09 DIAGNOSIS — M47817 Spondylosis without myelopathy or radiculopathy, lumbosacral region: Secondary | ICD-10-CM | POA: Insufficient documentation

## 2011-09-09 DIAGNOSIS — M25559 Pain in unspecified hip: Secondary | ICD-10-CM

## 2011-09-09 NOTE — Assessment & Plan Note (Signed)
This is a patient of Dr. Pamelia Hoit seen for chronic pain complaints in low back and knee.  She reports no change in her pain.  It is a burning and aching type pain.  General activity level is 7-9.  The pain is worse in the morning and evening.  Sleep patterns are fair.  Pain is worse with walking, bending, and standing.  Rest and medication tend to help. She uses her arm crutches for support.  She does not climb steps.  She can drive.  She can walk about 10 minutes at a time.  She is on disability.  She needs help with meal prep, household duties and shopping.  REVIEW OF SYSTEMS:  Notable for the difficulties described above, otherwise, within normal limits.  Her pill counts and UDS have been consistent, however, she did not bring her Norco bottle today because she left it to her husband to pack her medicines and he put 1 of her other medicines in her purse instead.  She was warned about not bringing her pill bottles in for count, but I did refill her medicines.  PAST MEDICAL HISTORY:  Unchanged.  SOCIAL HISTORY:  Unchanged.  FAMILY HISTORY:  Unchanged.  PHYSICAL EXAMINATION:  Her blood pressure is 163/75, pulse 123, respirations 18, O2 sats 90 on room air.  Her motor strength, sensation, otherwise is within normal limits.  She is morbidly obese.  She is alert and oriented x3, somewhat irritable and depressed.  Her gait is unstable.  IMPRESSION: 1. History of lumbago with spondylosis. 2. Bilateral hip flexion contractures. 3. Bilateral knee osteoarthritis. 4. Gait disorder due to pain, weight and osteoarthritis.  PLAN: 1. Refill MS Contin 15 mg 1 p.o. q.8 hours 90 with no refill. 2. Norco 10/325, 1 p.o. t.i.d. 90 with no refill.  Questions were encouraged and answered.  I will see her in a month.     Theresa Huffman     Brantley Stage, M.D. Electronically Signed   RLW/MedQ D:  09/09/2011 12:58:13  T:  09/09/2011 13:16:46  Job #:  [id number]

## 2011-09-19 ENCOUNTER — Other Ambulatory Visit: Payer: Self-pay | Admitting: *Deleted

## 2011-09-19 MED ORDER — GLYBURIDE 2.5 MG PO TABS: ORAL_TABLET | ORAL | Status: DC

## 2011-09-28 ENCOUNTER — Other Ambulatory Visit: Payer: Self-pay | Admitting: *Deleted

## 2011-09-28 MED ORDER — SITAGLIPTIN PHOSPHATE 100 MG PO TABS: 100.0000 mg | ORAL_TABLET | Freq: Every day | ORAL | Status: DC

## 2011-10-06 ENCOUNTER — Encounter: Payer: Medicare Other | Attending: Neurosurgery | Admitting: Neurosurgery

## 2011-10-06 DIAGNOSIS — M545 Low back pain, unspecified: Secondary | ICD-10-CM | POA: Insufficient documentation

## 2011-10-06 DIAGNOSIS — M79609 Pain in unspecified limb: Secondary | ICD-10-CM | POA: Insufficient documentation

## 2011-10-06 DIAGNOSIS — G8929 Other chronic pain: Secondary | ICD-10-CM | POA: Insufficient documentation

## 2011-10-06 DIAGNOSIS — M47817 Spondylosis without myelopathy or radiculopathy, lumbosacral region: Secondary | ICD-10-CM

## 2011-10-06 DIAGNOSIS — R269 Unspecified abnormalities of gait and mobility: Secondary | ICD-10-CM | POA: Insufficient documentation

## 2011-10-06 DIAGNOSIS — M1710 Unilateral primary osteoarthritis, unspecified knee: Secondary | ICD-10-CM

## 2011-10-06 DIAGNOSIS — M1990 Unspecified osteoarthritis, unspecified site: Secondary | ICD-10-CM | POA: Insufficient documentation

## 2011-10-06 DIAGNOSIS — M24559 Contracture, unspecified hip: Secondary | ICD-10-CM | POA: Insufficient documentation

## 2011-10-07 ENCOUNTER — Ambulatory Visit: Payer: Medicare Other | Admitting: Neurosurgery

## 2011-10-07 NOTE — Assessment & Plan Note (Signed)
This is a patient of Dr. Pamelia Hoit, seen for chronic low back and lower extremity pain.  Average pain is 8.  It is a burning aching type pain. General activity level is 7-9.  Pain is worse in the morning and evening.  Sleep patterns are poor.  Walking, bending, and standing aggravate.  Rest and medication helps.  She walks with arm crutches and uses a wheelchair.  Functionally, she needs help with household duties and meal prep.  REVIEW OF SYSTEMS:  Notable for the difficulties as described above. Otherwise, within normal limits.  PAST MEDICAL HISTORY:  Unchanged.  SOCIAL HISTORY:  Unchanged.  FAMILY HISTORY:  Unchanged.  PHYSICAL EXAMINATION:  VITAL SIGNS:  Blood pressure is 155/67, pulse 113, respirations 18, O2 sats 92 on room air. NEUROLOGIC:  Motor strength and sensation intact, although she walks with a kyphotic and a valgus type gait.  Constitutionally, she is morbidly obese.  She is alert and oriented x3, somewhat depressed.  ASSESSMENT: 1. History of lumbago with spondylosis. 2. Bilateral hip flexion contractures. 3. Bilateral knee osteoarthritis. 4. Gait disorder due to weight. 5. Osteoarthritis.  PLAN: 1. Refill MS Contin 15 mg 1 p.o. q.8 hours, #90 with no refill. 2. Norco 10/325, 1 p.o. t.i.d., #90 with no refill.  Her questions     were encouraged and answered.  I will see her in a month.     Shaleigh Laubscher L. Blima Dessert Electronically Signed    RLW/MedQ D:  10/06/2011 14:24:34  T:  10/07/2011 01:51:40  Job #:  [id number]

## 2011-10-11 ENCOUNTER — Other Ambulatory Visit: Payer: Self-pay | Admitting: *Deleted

## 2011-10-11 MED ORDER — CYCLOBENZAPRINE HCL 5 MG PO TABS: ORAL_TABLET | ORAL | Status: DC

## 2011-10-17 ENCOUNTER — Other Ambulatory Visit: Payer: Self-pay | Admitting: Internal Medicine

## 2011-10-17 DIAGNOSIS — I10 Essential (primary) hypertension: Secondary | ICD-10-CM

## 2011-10-17 MED ORDER — MOEXIPRIL HCL 15 MG PO TABS: 15.0000 mg | ORAL_TABLET | Freq: Every day | ORAL | Status: DC

## 2011-10-17 NOTE — Telephone Encounter (Signed)
Rx sent to pharmacy   

## 2011-11-03 ENCOUNTER — Encounter: Payer: Medicare Other | Admitting: Neurosurgery

## 2011-11-07 ENCOUNTER — Encounter: Payer: Medicare Other | Attending: Neurosurgery | Admitting: Neurosurgery

## 2011-11-07 ENCOUNTER — Other Ambulatory Visit: Payer: Self-pay | Admitting: *Deleted

## 2011-11-07 DIAGNOSIS — M545 Low back pain, unspecified: Secondary | ICD-10-CM | POA: Insufficient documentation

## 2011-11-07 DIAGNOSIS — M1710 Unilateral primary osteoarthritis, unspecified knee: Secondary | ICD-10-CM

## 2011-11-07 DIAGNOSIS — M62838 Other muscle spasm: Secondary | ICD-10-CM | POA: Insufficient documentation

## 2011-11-07 DIAGNOSIS — M47817 Spondylosis without myelopathy or radiculopathy, lumbosacral region: Secondary | ICD-10-CM | POA: Insufficient documentation

## 2011-11-07 DIAGNOSIS — M79609 Pain in unspecified limb: Secondary | ICD-10-CM | POA: Insufficient documentation

## 2011-11-07 DIAGNOSIS — G894 Chronic pain syndrome: Secondary | ICD-10-CM

## 2011-11-07 DIAGNOSIS — M1610 Unilateral primary osteoarthritis, unspecified hip: Secondary | ICD-10-CM | POA: Insufficient documentation

## 2011-11-07 DIAGNOSIS — R269 Unspecified abnormalities of gait and mobility: Secondary | ICD-10-CM | POA: Insufficient documentation

## 2011-11-07 DIAGNOSIS — M169 Osteoarthritis of hip, unspecified: Secondary | ICD-10-CM | POA: Insufficient documentation

## 2011-11-07 MED ORDER — SIMVASTATIN 40 MG PO TABS: 40.0000 mg | ORAL_TABLET | Freq: Every day | ORAL | Status: DC

## 2011-11-08 NOTE — Assessment & Plan Note (Signed)
This is a patient of Dr. Pamelia Hoit seen for lower extremity and low back pain.  It is unchanged.  It is 7 or 8.  It is a burning, aching type pain.  General activity level is 7-9.  Pain is worse with walking, bending, and standing.  Rest and medication help.  She uses a pair of on crutches or electric chair for immobilization.  She does not climb steps.  She can drive functionally.  She is on disability.  Needs help with ADLs and household duties.  REVIEW OF SYSTEMS:  Notable for the difficulties described above, otherwise unremarkable.  No suicidal thoughts or aberrant behaviors. Last pill count and UDS consistent.  PAST MEDICAL HISTORY/SOCIAL HISTORY/FAMILY HISTORY:  Unchanged.  PHYSICAL EXAMINATION:  Her blood pressure is 150/67, pulse 115, respirations 18, O2 sats 92 on room air.  Motor strength and sensation intact.  Constitutionally she is morbidly obese.  She is alert and oriented x3.  She has an unstable gait.  ASSESSMENT: 1. History of lumbago with spondylosis. 2. Bilateral hip flexion contractures. 3. Bilateral knee osteoarthritis. 4. Generalized left hip osteoarthritis. 5. Gait disorder due to weight.  PLAN: 1. Refill MS Contin 15 mg 1 p.o. q.8 hours, #90 with no refill. 2. Norco 10/325, 1 p.o. t.i.d., #90 with no refill.  Her questions     were encouraged and answered.  We will see her in a month.     Peightyn Roberson L. Blima Dessert Electronically Signed    RLW/MedQ D:  11/07/2011 15:47:38  T:  11/08/2011 06:12:39  Job #:  [id number]

## 2011-11-09 ENCOUNTER — Other Ambulatory Visit: Payer: Self-pay | Admitting: *Deleted

## 2011-11-09 MED ORDER — FUROSEMIDE 40 MG PO TABS: 40.0000 mg | ORAL_TABLET | Freq: Two times a day (BID) | ORAL | Status: DC

## 2011-11-21 ENCOUNTER — Other Ambulatory Visit: Payer: Self-pay | Admitting: *Deleted

## 2011-11-21 MED ORDER — CYCLOBENZAPRINE HCL 5 MG PO TABS: ORAL_TABLET | ORAL | Status: DC

## 2011-12-07 ENCOUNTER — Encounter: Payer: Medicare Other | Admitting: Physical Medicine and Rehabilitation

## 2011-12-13 ENCOUNTER — Encounter: Payer: Medicare Other | Attending: Physical Medicine and Rehabilitation

## 2011-12-13 DIAGNOSIS — M545 Low back pain, unspecified: Secondary | ICD-10-CM

## 2011-12-13 DIAGNOSIS — M79609 Pain in unspecified limb: Secondary | ICD-10-CM | POA: Insufficient documentation

## 2011-12-13 DIAGNOSIS — M1610 Unilateral primary osteoarthritis, unspecified hip: Secondary | ICD-10-CM | POA: Insufficient documentation

## 2011-12-13 DIAGNOSIS — M24559 Contracture, unspecified hip: Secondary | ICD-10-CM | POA: Insufficient documentation

## 2011-12-13 DIAGNOSIS — M1710 Unilateral primary osteoarthritis, unspecified knee: Secondary | ICD-10-CM

## 2011-12-13 DIAGNOSIS — M5380 Other specified dorsopathies, site unspecified: Secondary | ICD-10-CM

## 2011-12-13 DIAGNOSIS — R269 Unspecified abnormalities of gait and mobility: Secondary | ICD-10-CM | POA: Insufficient documentation

## 2011-12-13 DIAGNOSIS — M47817 Spondylosis without myelopathy or radiculopathy, lumbosacral region: Secondary | ICD-10-CM | POA: Insufficient documentation

## 2011-12-13 DIAGNOSIS — M169 Osteoarthritis of hip, unspecified: Secondary | ICD-10-CM | POA: Insufficient documentation

## 2011-12-26 ENCOUNTER — Other Ambulatory Visit: Payer: Self-pay | Admitting: *Deleted

## 2011-12-26 MED ORDER — IBUPROFEN 800 MG PO TABS: 800.0000 mg | ORAL_TABLET | Freq: Three times a day (TID) | ORAL | Status: DC

## 2011-12-26 MED ORDER — METFORMIN HCL 1000 MG PO TABS: ORAL_TABLET | ORAL | Status: DC

## 2011-12-26 MED ORDER — OMEPRAZOLE 20 MG PO CPDR: 20.0000 mg | DELAYED_RELEASE_CAPSULE | Freq: Every day | ORAL | Status: DC

## 2012-01-09 ENCOUNTER — Other Ambulatory Visit: Payer: Self-pay | Admitting: *Deleted

## 2012-01-09 MED ORDER — GLYBURIDE 2.5 MG PO TABS: ORAL_TABLET | ORAL | Status: DC

## 2012-01-09 NOTE — Telephone Encounter (Signed)
Rx refilled. Please schedule follow up for her.

## 2012-01-09 NOTE — Telephone Encounter (Signed)
Received faxed refill request from pharmacy. Is it okay to refill medication? When is patient due for lab work and follow-up appointment?

## 2012-01-10 NOTE — Telephone Encounter (Signed)
Advised patient, follow up appt scheduled.

## 2012-01-11 ENCOUNTER — Encounter: Payer: Self-pay | Admitting: Physical Medicine and Rehabilitation

## 2012-01-11 ENCOUNTER — Encounter: Payer: Medicare Other | Attending: Neurosurgery | Admitting: Physical Medicine and Rehabilitation

## 2012-01-11 VITALS — BP 158/63 | HR 125 | Resp 18 | Ht 63.0 in | Wt >= 6400 oz

## 2012-01-11 DIAGNOSIS — M1710 Unilateral primary osteoarthritis, unspecified knee: Secondary | ICD-10-CM | POA: Insufficient documentation

## 2012-01-11 DIAGNOSIS — M25562 Pain in left knee: Secondary | ICD-10-CM

## 2012-01-11 DIAGNOSIS — M25551 Pain in right hip: Secondary | ICD-10-CM | POA: Insufficient documentation

## 2012-01-11 DIAGNOSIS — M1990 Unspecified osteoarthritis, unspecified site: Secondary | ICD-10-CM | POA: Insufficient documentation

## 2012-01-11 DIAGNOSIS — G8929 Other chronic pain: Secondary | ICD-10-CM

## 2012-01-11 DIAGNOSIS — M25559 Pain in unspecified hip: Secondary | ICD-10-CM

## 2012-01-11 DIAGNOSIS — M545 Low back pain, unspecified: Secondary | ICD-10-CM

## 2012-01-11 DIAGNOSIS — R208 Other disturbances of skin sensation: Secondary | ICD-10-CM

## 2012-01-11 DIAGNOSIS — M25569 Pain in unspecified knee: Secondary | ICD-10-CM

## 2012-01-11 DIAGNOSIS — R269 Unspecified abnormalities of gait and mobility: Secondary | ICD-10-CM | POA: Insufficient documentation

## 2012-01-11 DIAGNOSIS — M47817 Spondylosis without myelopathy or radiculopathy, lumbosacral region: Secondary | ICD-10-CM | POA: Insufficient documentation

## 2012-01-11 DIAGNOSIS — R209 Unspecified disturbances of skin sensation: Secondary | ICD-10-CM

## 2012-01-11 DIAGNOSIS — M24559 Contracture, unspecified hip: Secondary | ICD-10-CM | POA: Insufficient documentation

## 2012-01-11 DIAGNOSIS — M79609 Pain in unspecified limb: Secondary | ICD-10-CM | POA: Insufficient documentation

## 2012-01-11 MED ORDER — HYDROCODONE-ACETAMINOPHEN 10-325 MG PO TABS: 1.0000 | ORAL_TABLET | Freq: Three times a day (TID) | ORAL | Status: DC

## 2012-01-11 MED ORDER — MORPHINE SULFATE CR 15 MG PO TB12: 15.0000 mg | ORAL_TABLET | Freq: Three times a day (TID) | ORAL | Status: DC

## 2012-01-11 NOTE — Patient Instructions (Signed)
Make sure you keep your pain medications in a secure location. Followup with nursing staff next 2 months for a pill count and a refill of your pain medications.Dr. Pamelia Hoit will see you back in 3 months.

## 2012-01-11 NOTE — Progress Notes (Signed)
Subjective:    Patient ID: Theresa Huffman, female    DOB: [birth year], 66 y.o.   MRN: [id number]  HPI  The patient is a 66 year old woman who is [facility] chronic pain complaints which are related predominantly to her low back and lower extremities. And followed by nurse practitioner over the last several months for refill of her pain medications and Narcotic medication drug monitoring.  Last urine drug screen was done 12/13/2011 and was consistent.  She reports no new problems. She reports no problems over sedation or constipation from pain medication.  Pain as an average of 8 on a scale of 10. Described as burning and aching. Significantly interfering with activity worse in the morning and evening. Pain is worse with walking bending and standing and immproves with rest and medication.   Reports fair to good relief with the use of these medications.  She uses forearm crutches for walking. She is Mining engineer wheelchair for community distances. His independent with ADLs with the exception of higher household tasks.     Review of SystemsPain Inventory Average Pain 8 Pain Right Now 8 My pain is burning and aching  In the last 24 hours, has pain interfered with the following? General activity 7 Relation with others 6 Enjoyment of life 9 What TIME of day is your pain at its worst? morning and evening Sleep (in general) Poor  Pain is worse with: walking, bending and standing Pain improves with: rest and medication Relief from Meds: 6  Mobility walk with assistance use a cane how many minutes can you walk? 10 ability to climb steps?  no do you drive?  yes use a wheelchair Do you have any goals in this area?  yes  Function disabled: date disabled  I need assistance with the following:  meal prep, household duties and shopping Do you have any goals in this area?  yes  Neuro/Psych trouble walking depression  Prior  Studies Any changes since last visit?  no  Physicians involved in your care Any changes since last visit?  no       Objective:   Physical Exam  Constitutional: She is oriented to person, place, and time.  Neurological: She is alert and oriented to person, place, and time. She has normal strength. Gait abnormal. Coordination normal. She displays no Babinski's sign on the right side. She displays no Babinski's sign on the left side.  Reflex Scores:      Patellar reflexes are 0 on the right side and 0 on the left side.      Achilles reflexes are 0 on the right side and 0 on the left side.      Intact to light touch and vibratory sensation in BLE's   patient is morbid obese. She is alert and oriented cooperative pleasant follows commands without difficulty nectar thick questions appropriately. Cranial nerves and coordination are grossly intact reflexes are diminished in both lower extremities without abnormal tone clonus or tremors.  Motor strength is good in both lower extremities. Light touch and vibratory sensation are intact.  Internal rotation at the right hip provokes posterior right hip pain.  Lateral joint line tenderness is noted at both knees. Effusion is difficult to assess due to excess tissue around knee joint. Some pain with flexion extension is appreciated.  Bilateral hip flexion contractures are appreciated as well.  Lumbar range of motion is limited in extension and she notes pain with extension. She ambulates forward  flexed over her forearm crutches. Tandem gait and Rhomberg test are not assessed.        Assessment & Plan:  1. Lumbago with known lumbar spondylolysis. Stable.  2. Bilateral knee osteoarthritis. Patient reports last injection to her right knee several years ago.  3. Bilateral foot burning with 2 year history of diabetes.  #4. Left hip pain onset approximately 2 months ago.  #5. Bilateral lower extremity lymphedema.  Plan: Patient defers any  workup on hips and knees at this time.  She has seen orthopedic surgeons locally and there are no radiographs to review in e chart.  I will refill patient's pain medication today. We discussed consideration of radiographs of hips and knees. At this time she like to defer this. She continues to use forearm crutches for ambulation. These help unload her hips and knees and allow her to walk approximately 10 minutes.  We'll have her follow back up with nursing staff over the next 2 months for refill of her pain medication and I will see her back in 3 months.  The Opioid medication pill count was appropriate.  No reported significant side effects of Opioid medications noted.  No aberrant behavior noted.  Patient cautioned regarding operation of machinery or vehicles.  Patient understands risk and benefits of these medications.  There is no indication or report of risk to self or others. She keeps her pain medications secure in a locked box.  Comfortable with this current plan I've answered all questions.

## 2012-01-23 ENCOUNTER — Other Ambulatory Visit: Payer: Self-pay | Admitting: *Deleted

## 2012-01-23 MED ORDER — LEVOTHYROXINE SODIUM 175 MCG PO TABS: 175.0000 ug | ORAL_TABLET | Freq: Every day | ORAL | Status: DC

## 2012-01-23 NOTE — Telephone Encounter (Signed)
OK to refill? No recent labs. Please send back to [facility].

## 2012-02-02 ENCOUNTER — Encounter: Payer: Self-pay | Admitting: Physical Medicine and Rehabilitation

## 2012-02-08 ENCOUNTER — Encounter: Payer: Self-pay | Admitting: *Deleted

## 2012-02-08 ENCOUNTER — Encounter: Payer: Medicare Other | Attending: Physical Medicine and Rehabilitation | Admitting: *Deleted

## 2012-02-08 VITALS — BP 158/85 | HR 110 | Resp 18 | Ht 63.0 in | Wt 390.0 lb

## 2012-02-08 DIAGNOSIS — M25559 Pain in unspecified hip: Secondary | ICD-10-CM

## 2012-02-08 DIAGNOSIS — M25569 Pain in unspecified knee: Secondary | ICD-10-CM

## 2012-02-08 DIAGNOSIS — R209 Unspecified disturbances of skin sensation: Secondary | ICD-10-CM | POA: Insufficient documentation

## 2012-02-08 DIAGNOSIS — M1710 Unilateral primary osteoarthritis, unspecified knee: Secondary | ICD-10-CM | POA: Insufficient documentation

## 2012-02-08 DIAGNOSIS — I890 Lymphedema, not elsewhere classified: Secondary | ICD-10-CM | POA: Insufficient documentation

## 2012-02-08 DIAGNOSIS — M545 Low back pain, unspecified: Secondary | ICD-10-CM

## 2012-02-08 DIAGNOSIS — E119 Type 2 diabetes mellitus without complications: Secondary | ICD-10-CM | POA: Insufficient documentation

## 2012-02-08 DIAGNOSIS — M47817 Spondylosis without myelopathy or radiculopathy, lumbosacral region: Secondary | ICD-10-CM | POA: Insufficient documentation

## 2012-02-08 MED ORDER — MORPHINE SULFATE CR 15 MG PO TB12: 15.0000 mg | ORAL_TABLET | Freq: Three times a day (TID) | ORAL | Status: DC

## 2012-02-08 MED ORDER — HYDROCODONE-ACETAMINOPHEN 10-325 MG PO TABS: 1.0000 | ORAL_TABLET | Freq: Three times a day (TID) | ORAL | Status: DC

## 2012-02-08 NOTE — Progress Notes (Signed)
10 lb weight loss recorded. Has cut out sweets because her blood sugar has been high and MD was advising going on insulin if not brought under control. Sees PCP next week. Home glucose checks have shown improved readings. No questions voiced for MD.

## 2012-02-09 ENCOUNTER — Encounter: Payer: Self-pay | Admitting: Physical Medicine and Rehabilitation

## 2012-02-13 ENCOUNTER — Ambulatory Visit (INDEPENDENT_AMBULATORY_CARE_PROVIDER_SITE_OTHER): Payer: Medicare Other | Admitting: Family Medicine

## 2012-02-13 ENCOUNTER — Encounter: Payer: Self-pay | Admitting: Family Medicine

## 2012-02-13 VITALS — BP 162/80 | HR 84 | Temp 98.0°F | Wt 388.0 lb

## 2012-02-13 DIAGNOSIS — E119 Type 2 diabetes mellitus without complications: Secondary | ICD-10-CM

## 2012-02-13 DIAGNOSIS — E039 Hypothyroidism, unspecified: Secondary | ICD-10-CM

## 2012-02-13 DIAGNOSIS — I10 Essential (primary) hypertension: Secondary | ICD-10-CM

## 2012-02-13 LAB — COMPREHENSIVE METABOLIC PANEL
Albumin: 4.1 g/dL (ref 3.5–5.2)
CO2: 23 mEq/L (ref 19–32)
Calcium: 9.8 mg/dL (ref 8.4–10.5)
Chloride: 101 mEq/L (ref 96–112)
GFR: 58.77 mL/min — ABNORMAL LOW (ref >60.00)
Glucose, Bld: 142 mg/dL — ABNORMAL HIGH (ref 70–99)
Potassium: 4.8 mEq/L (ref 3.5–5.1)
Sodium: 139 mEq/L (ref 135–145)
Total Protein: 8 g/dL (ref 6.0–8.3)

## 2012-02-13 LAB — TSH: TSH: 0.67 u[IU]/mL (ref 0.35–5.50)

## 2012-02-13 NOTE — Progress Notes (Addendum)
66 yo patient here to follow up DM.   Lab Results  Component Value Date   HGBA1C 8.5* 04/06/2011    Wt Readings from Last 3 Encounters:  02/13/12 388 lb (175.996 kg)  02/08/12 390 lb (176.903 kg)  01/11/12 400 lb (181.439 kg)    Has lost weight. Cut out sweets completely.  Checks sugars once day, they have been running much better.  Brings her log book in today- 88-130.   100s- 150s. Denies any symptoms of hypogylcemia. Takes Metfromin 1000 mg two times a day and Glyburide 2.5 mg two times a day. Added Januvia 100 mg daily last November.   Denies any episodes of hypogycemia.  BP up today- rushed to get here. Denies any HA, blurred vision or SOB (more than baseline).  Patient Active Problem List  Diagnoses  . HYPOTHYROIDISM  . DIABETES MELLITUS, TYPE II  . UNSPECIFIED VITAMIN D DEFICIENCY  . HYPERLIPIDEMIA  . OBESITY, MORBID  . HYPERTENSION  . LYMPHEDEMA, SEVERE  . UNSPECIFIED VENOUS INSUFFICIENCY  . ACUTE BRONCHITIS  . GERD  . Lumbago  . Ringworm  . URI (upper respiratory infection)  . Bilateral chronic knee pain  . Right hip pain  . Burning sensation of feet   Past Medical History  Diagnosis Date  . Hyperlipidemia   . Hypertension   . Low back pain   . Morbid obesity   . Diabetes mellitus     type II  . Thyroid disease     hypothyroidism  . GERD (gastroesophageal reflux disease)   . Lymphedema   . Lumbosacral spondylosis without myelopathy   . Pain in limb   . Lumbago   . Facet syndrome, lumbar   . Lymphedema   . Primary localized osteoarthrosis, lower leg   . Hypercholesteremia    Past Surgical History  Procedure Date  . Cholecystectomy   . Tubal ligation    History  Substance Use Topics  . Smoking status: Never Smoker   . Smokeless tobacco: Not on file  . Alcohol Use: No   Family History  Problem Relation Age of Onset  . Cancer Mother     lung and colon  . Stroke Sister    Allergies  Allergen Reactions  . Erythromycin     REACTION:  Nausea and vomiting   Current Outpatient Prescriptions on File Prior to Visit  Medication Sig Dispense Refill  . albuterol (PROAIR HFA) 108 (90 BASE) MCG/ACT inhaler Inhale 2 puffs into the lungs every 4 (four) hours as needed.  1 Inhaler  0  . aspirin 81 MG tablet Take 81 mg by mouth daily.        . Calcium Carbonate-Vitamin D 600-400 MG-UNIT per tablet 1200mg  once daily       . clotrimazole (LOTRIMIN) 1 % cream Apply topically 2 (two) times daily.  30 g  0  . cyclobenzaprine (FLEXERIL) 5 MG tablet Take one tablet three times each day.  90 tablet  0  . furosemide (LASIX) 40 MG tablet Take 1 tablet (40 mg total) by mouth 2 (two) times daily.  60 tablet  12  . gabapentin (NEURONTIN) 300 MG capsule Take two capsule by mouth three times daily       . glyBURIDE (DIABETA) 2.5 MG tablet Take two tablets by mouth 2 times daily  120 tablet  1  . HYDROcodone-acetaminophen (NORCO) 10-325 MG per tablet Take 1 tablet by mouth 3 (three) times daily. As needed for back or leg pain  90 tablet  0  . ibuprofen (ADVIL,MOTRIN) 800 MG tablet Take 1 tablet (800 mg total) by mouth 3 (three) times daily.  90 tablet  2  . levothyroxine (SYNTHROID, LEVOTHROID) 175 MCG tablet Take 1 tablet (175 mcg total) by mouth daily.  30 tablet  6  . metFORMIN (GLUCOPHAGE) 1000 MG tablet Take one tablet by mouth two times daily.  30 tablet  6  . moexipril (UNIVASC) 15 MG tablet Take 1 tablet (15 mg total) by mouth daily.  30 tablet  6  . morphine (MS CONTIN) 15 MG 12 hr tablet Take 1 tablet (15 mg total) by mouth 3 (three) times daily.  90 tablet  0  . omeprazole (PRILOSEC) 20 MG capsule Take 1 capsule (20 mg total) by mouth daily.  30 capsule  2  . potassium chloride SA (K-DUR,KLOR-CON) 20 MEQ tablet Take 1 tablet (20 mEq total) by mouth daily.  30 tablet  6  . simvastatin (ZOCOR) 40 MG tablet Take 1 tablet (40 mg total) by mouth daily.  30 tablet  6  . sitaGLIPtin (JANUVIA) 100 MG tablet Take 1 tablet (100 mg total) by mouth daily.   30 tablet  6  . venlafaxine (EFFEXOR) 75 MG tablet Take three tablets by mouth every night at bedtime  90 tablet  3   The PMH, PSH, Social History, Family History, Medications, and allergies have been reviewed in [facility], and have been updated if relevant.  Review of Systems  See HPI  ENT: Denies decreased hearing.  CV: Denies chest pain or discomfort, shortness of breath with exertion, swelling of feet, and swelling of hands.  Endo: Denies polyuria.   Physical Exam  BP 162/80  Pulse 84  Temp(Src) 98 F (36.7 C) (Oral)  Wt 388 lb (175.996 kg) BP Readings from Last 3 Encounters:  02/13/12 162/80  02/08/12 158/85  01/11/12 158/63    General: morbidily obese, NAD.  Mouth: MMM  Resp:  CTA bilaterally, dec BS at bases.  CVS:  RRR, distant heart sounds Extremities: Venous status, LE bilaterally 1+, moderate lymphedema bilaterally  Psych: Oriented X3, memory intact for recent and remote, and normally interactive.   Assessment and Plan: 1. DIABETES MELLITUS, TYPE II  Improved.  Congratulated her on weight loss. Recheck a1c today. Hemoglobin A1c, Comprehensive metabolic panel  2. Hypothyroidism  TSH, T4, Free   3. HYPERTENSION     Deteriorated but rushed to get here today.  Has been normotensive at pain clinic.

## 2012-02-13 NOTE — Patient Instructions (Signed)
It was great to see you. I am so proud of you!!!

## 2012-03-07 ENCOUNTER — Ambulatory Visit: Payer: Medicare Other | Admitting: Physical Medicine and Rehabilitation

## 2012-03-08 ENCOUNTER — Encounter: Payer: Self-pay | Admitting: *Deleted

## 2012-03-08 ENCOUNTER — Encounter: Payer: Medicare Other | Attending: Physical Medicine and Rehabilitation | Admitting: *Deleted

## 2012-03-08 VITALS — BP 146/64 | HR 90 | Resp 18 | Ht 63.5 in | Wt 390.0 lb

## 2012-03-08 DIAGNOSIS — M47817 Spondylosis without myelopathy or radiculopathy, lumbosacral region: Secondary | ICD-10-CM | POA: Insufficient documentation

## 2012-03-08 DIAGNOSIS — M25569 Pain in unspecified knee: Secondary | ICD-10-CM

## 2012-03-08 DIAGNOSIS — M79609 Pain in unspecified limb: Secondary | ICD-10-CM | POA: Insufficient documentation

## 2012-03-08 DIAGNOSIS — I890 Lymphedema, not elsewhere classified: Secondary | ICD-10-CM | POA: Insufficient documentation

## 2012-03-08 DIAGNOSIS — M545 Low back pain, unspecified: Secondary | ICD-10-CM | POA: Insufficient documentation

## 2012-03-08 DIAGNOSIS — R209 Unspecified disturbances of skin sensation: Secondary | ICD-10-CM | POA: Insufficient documentation

## 2012-03-08 DIAGNOSIS — M1710 Unilateral primary osteoarthritis, unspecified knee: Secondary | ICD-10-CM | POA: Insufficient documentation

## 2012-03-08 DIAGNOSIS — E119 Type 2 diabetes mellitus without complications: Secondary | ICD-10-CM | POA: Insufficient documentation

## 2012-03-08 DIAGNOSIS — M25559 Pain in unspecified hip: Secondary | ICD-10-CM

## 2012-03-08 MED ORDER — HYDROCODONE-ACETAMINOPHEN 10-325 MG PO TABS: 1.0000 | ORAL_TABLET | Freq: Three times a day (TID) | ORAL | Status: DC

## 2012-03-08 MED ORDER — MORPHINE SULFATE ER 15 MG PO TBCR: 15.0000 mg | EXTENDED_RELEASE_TABLET | Freq: Three times a day (TID) | ORAL | Status: DC

## 2012-03-08 NOTE — Progress Notes (Signed)
No changes noted. Advised pt to keep medications in a safe place. 

## 2012-03-09 ENCOUNTER — Other Ambulatory Visit: Payer: Self-pay

## 2012-03-09 MED ORDER — GABAPENTIN 300 MG PO CAPS: ORAL_CAPSULE | ORAL | Status: DC

## 2012-03-09 NOTE — Telephone Encounter (Signed)
Pt request gabapentin refill.  Order filled.

## 2012-03-12 ENCOUNTER — Ambulatory Visit: Payer: Medicare Other | Admitting: Physical Medicine and Rehabilitation

## 2012-03-23 ENCOUNTER — Other Ambulatory Visit: Payer: Self-pay | Admitting: *Deleted

## 2012-03-23 MED ORDER — GLYBURIDE 2.5 MG PO TABS: ORAL_TABLET | ORAL | Status: DC

## 2012-03-23 MED ORDER — POTASSIUM CHLORIDE CRYS ER 20 MEQ PO TBCR: 20.0000 meq | EXTENDED_RELEASE_TABLET | Freq: Every day | ORAL | Status: DC

## 2012-04-06 ENCOUNTER — Other Ambulatory Visit: Payer: Self-pay | Admitting: *Deleted

## 2012-04-06 ENCOUNTER — Encounter: Payer: Self-pay | Admitting: Physical Medicine and Rehabilitation

## 2012-04-06 ENCOUNTER — Encounter
Payer: Medicare Other | Attending: Physical Medicine and Rehabilitation | Admitting: Physical Medicine and Rehabilitation

## 2012-04-06 VITALS — BP 164/94 | HR 111 | Ht 64.0 in | Wt >= 6400 oz

## 2012-04-06 DIAGNOSIS — G8929 Other chronic pain: Secondary | ICD-10-CM | POA: Insufficient documentation

## 2012-04-06 DIAGNOSIS — E7800 Pure hypercholesterolemia, unspecified: Secondary | ICD-10-CM | POA: Insufficient documentation

## 2012-04-06 DIAGNOSIS — G609 Hereditary and idiopathic neuropathy, unspecified: Secondary | ICD-10-CM | POA: Insufficient documentation

## 2012-04-06 DIAGNOSIS — I10 Essential (primary) hypertension: Secondary | ICD-10-CM | POA: Insufficient documentation

## 2012-04-06 DIAGNOSIS — M545 Low back pain, unspecified: Secondary | ICD-10-CM | POA: Insufficient documentation

## 2012-04-06 DIAGNOSIS — M25561 Pain in right knee: Secondary | ICD-10-CM

## 2012-04-06 DIAGNOSIS — E119 Type 2 diabetes mellitus without complications: Secondary | ICD-10-CM | POA: Insufficient documentation

## 2012-04-06 DIAGNOSIS — M25569 Pain in unspecified knee: Secondary | ICD-10-CM

## 2012-04-06 DIAGNOSIS — K219 Gastro-esophageal reflux disease without esophagitis: Secondary | ICD-10-CM | POA: Insufficient documentation

## 2012-04-06 MED ORDER — MORPHINE SULFATE ER 15 MG PO TBCR: 15.0000 mg | EXTENDED_RELEASE_TABLET | Freq: Three times a day (TID) | ORAL | Status: DC

## 2012-04-06 MED ORDER — HYDROCODONE-ACETAMINOPHEN 10-325 MG PO TABS: 1.0000 | ORAL_TABLET | Freq: Three times a day (TID) | ORAL | Status: DC

## 2012-04-06 NOTE — Progress Notes (Signed)
Subjective:    Patient ID: Theresa Huffman, female    DOB: 1946/02/28, 66 y.o.   MRN: [id number]  HPI The patient complains about chronic bilateral knee pain . The patient also complains about low back pain. She states that her right hip pain has improved, therefore she does not want to consider x-rays of her hip yet. The problem has been stable otherwise .  Pain Inventory Average Pain 8 Pain Right Now 8 My pain is burning and aching  In the last 24 hours, has pain interfered with the following? General activity 8 Relation with others 8 Enjoyment of life 8 What TIME of day is your pain at its worst? morning and evening Sleep (in general) Fair  Pain is worse with: walking, bending and standing Pain improves with: medication Relief from Meds: 6  Mobility walk without assistance use a cane how many minutes can you walk? 10 ability to climb steps?  no do you drive?  yes Do you have any goals in this area?  yes  Function disabled: date disabled  Do you have any goals in this area?  no  Neuro/Psych weakness trouble walking depression  Prior Studies Any changes since last visit?  no  Physicians involved in your care Any changes since last visit?  no   Family History  Problem Relation Age of Onset  . Cancer Mother     lung and colon  . Stroke Sister    History   Social History  . Marital Status: Married    Spouse Name: N/A    Number of Children: 1  . Years of Education: N/A   Occupational History  . Diability    Social History Main Topics  . Smoking status: Never Smoker   . Smokeless tobacco: None  . Alcohol Use: No  . Drug Use:   . Sexually Active:    Other Topics Concern  . None   Social History Narrative   Lives in [place], one adult child   Past Surgical History  Procedure Date  . Cholecystectomy   . Tubal ligation    Past Medical History  Diagnosis Date  . Hyperlipidemia   . Hypertension   . Low back pain   . Morbid obesity    . Diabetes mellitus     type II  . Thyroid disease     hypothyroidism  . GERD (gastroesophageal reflux disease)   . Lymphedema   . Lumbosacral spondylosis without myelopathy   . Pain in limb   . Lumbago   . Facet syndrome, lumbar   . Lymphedema   . Primary localized osteoarthrosis, lower leg   . Hypercholesteremia    BP 164/94  Pulse 111  Ht 5\' 4"  (1.626 m)  Wt 404 lb (183.253 kg)  BMI 69.35 kg/m2  SpO2 92%     Review of Systems  Neurological: Positive for weakness.  All other systems reviewed and are negative.       Objective:   Physical Exam  Constitutional: She is oriented to person, place, and time. She appears well-developed and well-nourished.       Morbidly obese, walks with crutches  HENT:  Head: Normocephalic.  Neck: Neck supple.  Musculoskeletal: She exhibits tenderness.  Neurological: She is alert and oriented to person, place, and time.  Skin: Skin is warm and dry.  Psychiatric: She has a normal mood and affect.   Symmetric normal motor tone is noted throughout. Normal muscle bulk. Muscle testing reveals 5/5 muscle strength of the  upper extremity, and 5/5 of the lower extremity, except iliopsoas on the right 4-/5. Full range of motion in upper and lower extremities. ROM of spine is  restricted. Difficulties to elicit DTR, because of body habitus. DTR in the upper and lower extremity are present and symmetric 2+. No clonus is noted.  Patient arises from chair without difficulty. Wide  based gait with 2 crutches.         Assessment & Plan:  This is a 66  year old female with 1.Bilateral chronic knee pain 2.Low back pain 3.Morbidly obese 4.Peripheral neuropathy, s/p DM Plan : Continue with medication. Advised patient to walk in her pool, or do some light exercising on land, or  in the pool . Refilled her MS Contin today. Follow up in 1 month.

## 2012-04-06 NOTE — Patient Instructions (Signed)
Continue with your medication, try to exercise with the help of your husband, and in your pool.

## 2012-05-01 ENCOUNTER — Other Ambulatory Visit: Payer: Self-pay | Admitting: *Deleted

## 2012-05-01 MED ORDER — IBUPROFEN 800 MG PO TABS: 800.0000 mg | ORAL_TABLET | Freq: Three times a day (TID) | ORAL | Status: DC

## 2012-05-08 ENCOUNTER — Encounter: Payer: Self-pay | Admitting: Physical Medicine and Rehabilitation

## 2012-05-08 ENCOUNTER — Encounter
Payer: Medicare Other | Attending: Physical Medicine and Rehabilitation | Admitting: Physical Medicine and Rehabilitation

## 2012-05-08 VITALS — BP 180/59 | HR 100 | Resp 16 | Ht 64.0 in | Wt >= 6400 oz

## 2012-05-08 DIAGNOSIS — E1142 Type 2 diabetes mellitus with diabetic polyneuropathy: Secondary | ICD-10-CM | POA: Insufficient documentation

## 2012-05-08 DIAGNOSIS — G8929 Other chronic pain: Secondary | ICD-10-CM | POA: Insufficient documentation

## 2012-05-08 DIAGNOSIS — M545 Low back pain, unspecified: Secondary | ICD-10-CM | POA: Insufficient documentation

## 2012-05-08 DIAGNOSIS — M25559 Pain in unspecified hip: Secondary | ICD-10-CM | POA: Insufficient documentation

## 2012-05-08 DIAGNOSIS — M25569 Pain in unspecified knee: Secondary | ICD-10-CM | POA: Insufficient documentation

## 2012-05-08 DIAGNOSIS — E1149 Type 2 diabetes mellitus with other diabetic neurological complication: Secondary | ICD-10-CM | POA: Insufficient documentation

## 2012-05-08 DIAGNOSIS — M25561 Pain in right knee: Secondary | ICD-10-CM

## 2012-05-08 MED ORDER — HYDROCODONE-ACETAMINOPHEN 10-325 MG PO TABS: 1.0000 | ORAL_TABLET | Freq: Three times a day (TID) | ORAL | Status: DC

## 2012-05-08 MED ORDER — MORPHINE SULFATE ER 15 MG PO TBCR: 15.0000 mg | EXTENDED_RELEASE_TABLET | Freq: Three times a day (TID) | ORAL | Status: DC

## 2012-05-08 NOTE — Patient Instructions (Signed)
Patient will get informed about gastric bypass or lab band surgery.

## 2012-05-08 NOTE — Progress Notes (Signed)
Subjective:    Theresa Huffman: [name], female    DOB: July 19, 1946, 66 y.o.   MRN: [id number]  HPI The Theresa Huffman complains about chronic bilateral knee pain . The Theresa Huffman also complains about low back pain. She states that her right hip pain has improved, therefore she does not want to consider x-rays of her hip yet.  The problem has been stable otherwise.   The Theresa Huffman reports that she will go to an informatory lecture about gastric bypass and lab band surgery.  Pain Inventory Average Pain 8 Pain Right Now 8 My pain is burning and aching  In the last 24 hours, has pain interfered with the following? General activity 9 Relation with others 7 Enjoyment of life 7 What TIME of day is your pain at its worst? morning and evening Sleep (in general) Fair  Pain is worse with: walking, bending and standing Pain improves with: medication Relief from Meds: 6  Mobility walk with assistance how many minutes can you walk? 10 ability to climb steps?  no do you drive?  yes Do you have any goals in this area?  yes  Function disabled: date disabled  I need assistance with the following:  meal prep, household duties and shopping Do you have any goals in this area?  yes  Neuro/Psych weakness trouble walking  Prior Studies Any changes since last visit?  no  Physicians involved in your care Any changes since last visit?  no   Family History  Problem Relation Age of Onset  . Cancer Mother     lung and colon  . Stroke Sister    History   Social History  . Marital Status: Married    Spouse Name: N/A    Number of Children: 1  . Years of Education: N/A   Occupational History  . Diability    Social History Main Topics  . Smoking status: Never Smoker   . Smokeless tobacco: None  . Alcohol Use: No  . Drug Use:   . Sexually Active:    Other Topics Concern  . None   Social History Narrative   Lives in [place], one adult child   Past Surgical History  Procedure  Date  . Cholecystectomy   . Tubal ligation    Past Medical History  Diagnosis Date  . Hyperlipidemia   . Hypertension   . Low back pain   . Morbid obesity   . Diabetes mellitus     type II  . Thyroid disease     hypothyroidism  . GERD (gastroesophageal reflux disease)   . Lymphedema   . Lumbosacral spondylosis without myelopathy   . Pain in limb   . Lumbago   . Facet syndrome, lumbar   . Lymphedema   . Primary localized osteoarthrosis, lower leg   . Hypercholesteremia    BP 180/59  Pulse 100  Resp 16  Ht 5\' 4"  (1.626 m)  Wt 400 lb (181.439 kg)  BMI 68.66 kg/m2  SpO2 92%     Review of Systems  Musculoskeletal: Positive for back pain, arthralgias and gait problem.  Neurological: Positive for weakness.  All other systems reviewed and are negative.       Objective:   Physical Exam Constitutional: She is oriented to person, place, and time. She appears well-developed and well-nourished.  Morbidly obese, walks with crutches  HENT:  Head: Normocephalic.  Neck: Neck supple.  Musculoskeletal: She exhibits tenderness.  Neurological: She is alert and oriented to person, place, and time.  Skin: Skin is warm and dry.  Psychiatric: She has a normal mood and affect.   Symmetric normal motor tone is noted throughout. Normal muscle bulk. Muscle testing reveals 5/5 muscle strength of the upper extremity, and 5/5 of the lower extremity, except iliopsoas on the right 4-/5. Full range of motion in upper and lower extremities. ROM of spine is restricted. Difficulties to elicit DTR, because of body habitus.  DTR in the upper and lower extremity are present and symmetric 2+. No clonus is noted.  Theresa Huffman arises from chair without difficulty. Wide based gait with 2 crutches.         Assessment & Plan:  This is a 66 year old female with  1.Bilateral chronic knee pain  2.Low back pain  3.Morbidly obese  4.Peripheral neuropathy, s/p DM  Plan :  Continue with medication.  Advised Theresa Huffman to walk in her pool, or do some light exercising on land, or in the pool . The Theresa Huffman states that she is considering a gastric bypass or a lab band  Surgery. Refilled her MS Contin today.  Follow up in 1 month.

## 2012-05-18 ENCOUNTER — Other Ambulatory Visit: Payer: Self-pay | Admitting: *Deleted

## 2012-05-18 DIAGNOSIS — I10 Essential (primary) hypertension: Secondary | ICD-10-CM

## 2012-05-18 MED ORDER — MOEXIPRIL HCL 15 MG PO TABS: 15.0000 mg | ORAL_TABLET | Freq: Every day | ORAL | Status: DC

## 2012-06-01 ENCOUNTER — Telehealth: Payer: Self-pay | Admitting: Physical Medicine and Rehabilitation

## 2012-06-01 MED ORDER — HYDROCODONE-ACETAMINOPHEN 10-325 MG PO TABS: 1.0000 | ORAL_TABLET | Freq: Three times a day (TID) | ORAL | Status: DC

## 2012-06-01 MED ORDER — MORPHINE SULFATE ER 15 MG PO TBCR: 15.0000 mg | EXTENDED_RELEASE_TABLET | Freq: Three times a day (TID) | ORAL | Status: DC

## 2012-06-01 NOTE — Telephone Encounter (Signed)
Pt aware that rx's are ready to be picked up.

## 2012-06-01 NOTE — Telephone Encounter (Signed)
Printed for Karen to sign 

## 2012-06-01 NOTE — Telephone Encounter (Signed)
Patient cx'd 06/04/12 appointment due to husbands emergency MD appointment.  No other appts available before she runs out, so rescheduled to 07/11/12.  Can someone p/u her Rx?  Please call.

## 2012-06-04 ENCOUNTER — Encounter: Payer: Medicare Other | Admitting: Physical Medicine and Rehabilitation

## 2012-06-12 ENCOUNTER — Telehealth: Payer: Self-pay | Admitting: *Deleted

## 2012-06-12 NOTE — Telephone Encounter (Signed)
Form for diabetic testing supplies in your IN box for completion. 

## 2012-06-13 ENCOUNTER — Telehealth: Payer: Self-pay | Admitting: *Deleted

## 2012-06-13 NOTE — Telephone Encounter (Signed)
Form for diabetic supplies is on your desk.  I checked with the patient and she does want these supplies. 

## 2012-06-14 NOTE — Telephone Encounter (Signed)
Signed, in my box. 

## 2012-06-14 NOTE — Telephone Encounter (Signed)
Rx signed, in my box. 

## 2012-06-14 NOTE — Telephone Encounter (Signed)
Form faxed

## 2012-06-15 ENCOUNTER — Encounter: Payer: Self-pay | Admitting: Family Medicine

## 2012-06-22 ENCOUNTER — Other Ambulatory Visit: Payer: Self-pay | Admitting: *Deleted

## 2012-06-22 MED ORDER — SIMVASTATIN 40 MG PO TABS: 40.0000 mg | ORAL_TABLET | Freq: Every day | ORAL | Status: DC

## 2012-07-11 ENCOUNTER — Other Ambulatory Visit: Payer: Self-pay | Admitting: Physical Medicine and Rehabilitation

## 2012-07-11 ENCOUNTER — Encounter
Payer: Medicare Other | Attending: Physical Medicine and Rehabilitation | Admitting: Physical Medicine and Rehabilitation

## 2012-07-11 ENCOUNTER — Encounter: Payer: Self-pay | Admitting: Physical Medicine and Rehabilitation

## 2012-07-11 VITALS — BP 151/68 | HR 101 | Resp 18 | Ht 63.0 in | Wt 399.0 lb

## 2012-07-11 DIAGNOSIS — Z5181 Encounter for therapeutic drug level monitoring: Secondary | ICD-10-CM

## 2012-07-11 DIAGNOSIS — M545 Low back pain, unspecified: Secondary | ICD-10-CM | POA: Insufficient documentation

## 2012-07-11 DIAGNOSIS — G8929 Other chronic pain: Secondary | ICD-10-CM | POA: Insufficient documentation

## 2012-07-11 DIAGNOSIS — E1142 Type 2 diabetes mellitus with diabetic polyneuropathy: Secondary | ICD-10-CM | POA: Insufficient documentation

## 2012-07-11 DIAGNOSIS — M25569 Pain in unspecified knee: Secondary | ICD-10-CM | POA: Insufficient documentation

## 2012-07-11 DIAGNOSIS — E1149 Type 2 diabetes mellitus with other diabetic neurological complication: Secondary | ICD-10-CM | POA: Insufficient documentation

## 2012-07-11 DIAGNOSIS — Z79899 Other long term (current) drug therapy: Secondary | ICD-10-CM

## 2012-07-11 MED ORDER — MORPHINE SULFATE ER 15 MG PO TBCR: 15.0000 mg | EXTENDED_RELEASE_TABLET | Freq: Three times a day (TID) | ORAL | Status: DC

## 2012-07-11 MED ORDER — HYDROCODONE-ACETAMINOPHEN 10-325 MG PO TABS: 1.0000 | ORAL_TABLET | Freq: Three times a day (TID) | ORAL | Status: DC

## 2012-07-11 NOTE — Patient Instructions (Signed)
Try to exercise in a sitting or lying position to strengthen your leg muscles.

## 2012-07-11 NOTE — Progress Notes (Signed)
Subjective:    Patient ID: Theresa Huffman, female    DOB: May 08, 1946, 66 y.o.   MRN: [id number]  HPI The patient complains about chronic bilateral knee pain . The patient also complains about low back pain. She states that her right hip pain has improved, therefore she does not want to consider x-rays of her hip yet.  The problem has been stable otherwise.  The patient reports that she went to a seminar about gastric bypass and lab band surgery, and is thinking about those options.  Pain Inventory Average Pain 8 Pain Right Now 8 My pain is burning and aching  In the last 24 hours, has pain interfered with the following? General activity 8 Relation with others 6 Enjoyment of life 7 What TIME of day is your pain at its worst? morning and evening Sleep (in general) Poor  Pain is worse with: walking, bending, sitting, standing and some activites Pain improves with: rest and medication Relief from Meds: 4  Mobility walk without assistance how many minutes can you walk? 10/ uses crutches ability to climb steps?  no do you drive?  yes use a wheelchair  Function I need assistance with the following:  meal prep, household duties and shopping  Neuro/Psych No problems in this area  Prior Studies Any changes since last visit?  no  Physicians involved in your care Any changes since last visit?  no   Family History  Problem Relation Age of Onset  . Cancer Mother     lung and colon  . Stroke Sister    History   Social History  . Marital Status: Married    Spouse Name: N/A    Number of Children: 1  . Years of Education: N/A   Occupational History  . Diability    Social History Main Topics  . Smoking status: Never Smoker   . Smokeless tobacco: Never Used  . Alcohol Use: No  . Drug Use: None  . Sexually Active: None   Other Topics Concern  . None   Social History Narrative   Lives in [place], one adult child   Past Surgical History  Procedure Date    . Cholecystectomy   . Tubal ligation    Past Medical History  Diagnosis Date  . Hyperlipidemia   . Hypertension   . Low back pain   . Morbid obesity   . Diabetes mellitus     type II  . Thyroid disease     hypothyroidism  . GERD (gastroesophageal reflux disease)   . Lymphedema   . Lumbosacral spondylosis without myelopathy   . Pain in limb   . Lumbago   . Facet syndrome, lumbar   . Lymphedema   . Primary localized osteoarthrosis, lower leg   . Hypercholesteremia    BP 151/68  Pulse 101  Resp 18  Ht 5\' 3"  (1.6 m)  Wt 399 lb (180.985 kg)  BMI 70.68 kg/m2  SpO2 91%    Review of Systems  Musculoskeletal: Positive for back pain.       Shoulder pain, and knee pain  All other systems reviewed and are negative.       Objective:   Physical Exam Constitutional: She is oriented to person, place, and time. She appears well-developed and well-nourished.  Morbidly obese, walks with crutches  HENT:  Head: Normocephalic.  Neck: Neck supple.  Musculoskeletal: She exhibits tenderness.  Neurological: She is alert and oriented to person, place, and time.  Skin: Skin is warm and  dry.  Psychiatric: She has a normal mood and affect.  Symmetric normal motor tone is noted throughout. Normal muscle bulk. Muscle testing reveals 5/5 muscle strength of the upper extremity, and 5/5 of the lower extremity, except iliopsoas on the right 4-/5. Full range of motion in upper and lower extremities. ROM of spine is restricted. Difficulties to elicit DTR, because of body habitus.  DTR in the upper and lower extremity are present and symmetric 2+. No clonus is noted.  Patient arises from chair without difficulty. Wide based gait with 2 crutches.         Assessment & Plan:  This is a 66 year old female with  1.Bilateral chronic knee pain  2.Low back pain  3.Morbidly obese  4.Peripheral neuropathy, s/p DM  Plan :  Continue with medication. Advised patient to do some light exercising in a  sitting or lying position.  The patient states that she is considering a gastric bypass or a lab band Surgery, she states that she has been to a seminar for that .  Refilled her MS Contin today.  Follow up in 1 month.

## 2012-07-19 ENCOUNTER — Other Ambulatory Visit: Payer: Self-pay

## 2012-07-19 MED ORDER — GABAPENTIN 300 MG PO CAPS: ORAL_CAPSULE | ORAL | Status: DC

## 2012-07-26 ENCOUNTER — Other Ambulatory Visit: Payer: Self-pay | Admitting: *Deleted

## 2012-07-26 MED ORDER — METFORMIN HCL 1000 MG PO TABS: ORAL_TABLET | ORAL | Status: DC

## 2012-08-02 ENCOUNTER — Encounter: Payer: Self-pay | Admitting: Family Medicine

## 2012-08-02 ENCOUNTER — Ambulatory Visit (INDEPENDENT_AMBULATORY_CARE_PROVIDER_SITE_OTHER): Payer: Medicare Other | Admitting: Family Medicine

## 2012-08-02 VITALS — BP 136/74 | HR 68 | Temp 97.9°F | Wt 386.0 lb

## 2012-08-02 DIAGNOSIS — J069 Acute upper respiratory infection, unspecified: Secondary | ICD-10-CM

## 2012-08-02 MED ORDER — ALBUTEROL SULFATE HFA 108 (90 BASE) MCG/ACT IN AERS: 2.0000 | INHALATION_SPRAY | RESPIRATORY_TRACT | Status: DC | PRN

## 2012-08-02 MED ORDER — AZITHROMYCIN 250 MG PO TABS: ORAL_TABLET | ORAL | Status: AC

## 2012-08-02 NOTE — Progress Notes (Signed)
SUBJECTIVE:  Theresa Huffman is a 66 y.o. female who complains of coryza, congestion, sneezing, sore throat, productive cough and wheezing for 14 days. She denies a history of chest pain, chills and fevers and does a history of asthma. Patient denies smoke cigarettes.   Patient Active Problem List  Diagnosis  . HYPOTHYROIDISM  . DIABETES MELLITUS, TYPE II  . UNSPECIFIED VITAMIN D DEFICIENCY  . HYPERLIPIDEMIA  . OBESITY, MORBID  . HYPERTENSION  . LYMPHEDEMA, SEVERE  . UNSPECIFIED VENOUS INSUFFICIENCY  . ACUTE BRONCHITIS  . GERD  . Lumbago  . Ringworm  . URI (upper respiratory infection)  . Bilateral chronic knee pain  . Right hip pain  . Burning sensation of feet   Past Medical History  Diagnosis Date  . Hyperlipidemia   . Hypertension   . Low back pain   . Morbid obesity   . Diabetes mellitus     type II  . Thyroid disease     hypothyroidism  . GERD (gastroesophageal reflux disease)   . Lymphedema   . Lumbosacral spondylosis without myelopathy   . Pain in limb   . Lumbago   . Facet syndrome, lumbar   . Lymphedema   . Primary localized osteoarthrosis, lower leg   . Hypercholesteremia    Past Surgical History  Procedure Date  . Cholecystectomy   . Tubal ligation    History  Substance Use Topics  . Smoking status: Never Smoker   . Smokeless tobacco: Never Used  . Alcohol Use: No   Family History  Problem Relation Age of Onset  . Cancer Mother     lung and colon  . Stroke Sister    Allergies  Allergen Reactions  . Erythromycin     REACTION: Nausea and vomiting   Current Outpatient Prescriptions on File Prior to Visit  Medication Sig Dispense Refill  . aspirin 81 MG tablet Take 81 mg by mouth daily.        . Calcium Carbonate-Vitamin D 600-400 MG-UNIT per tablet 1200mg  once daily       . furosemide (LASIX) 40 MG tablet Take 1 tablet (40 mg total) by mouth 2 (two) times daily.  60 tablet  12  . gabapentin (NEURONTIN) 300 MG capsule Take two capsule by  mouth three times daily  180 capsule  2  . glyBURIDE (DIABETA) 2.5 MG tablet Take two tablets by mouth 2 times daily  120 tablet  11  . HYDROcodone-acetaminophen (NORCO) 10-325 MG per tablet Take 1 tablet by mouth 3 (three) times daily. As needed for back or leg pain  90 tablet  0  . ibuprofen (ADVIL,MOTRIN) 800 MG tablet Take 1 tablet (800 mg total) by mouth 3 (three) times daily.  90 tablet  2  . levothyroxine (SYNTHROID, LEVOTHROID) 175 MCG tablet Take 1 tablet (175 mcg total) by mouth daily.  30 tablet  6  . metFORMIN (GLUCOPHAGE) 1000 MG tablet Take one tablet by mouth two times daily.  30 tablet  6  . moexipril (UNIVASC) 15 MG tablet Take 1 tablet (15 mg total) by mouth daily.  30 tablet  6  . morphine (MS CONTIN) 15 MG 12 hr tablet Take 1 tablet (15 mg total) by mouth 3 (three) times daily.  90 tablet  0  . potassium chloride SA (K-DUR,KLOR-CON) 20 MEQ tablet Take 1 tablet (20 mEq total) by mouth daily.  30 tablet  6  . simvastatin (ZOCOR) 40 MG tablet Take 1 tablet (40 mg total) by mouth  daily.  30 tablet  4  . sitaGLIPtin (JANUVIA) 100 MG tablet Take 1 tablet (100 mg total) by mouth daily.  30 tablet  6  . venlafaxine (EFFEXOR) 75 MG tablet Take three tablets by mouth every night at bedtime  90 tablet  3   The PMH, PSH, Social History, Family History, Medications, and allergies have been reviewed in [facility], and have been updated if relevant.  OBJECTIVE: BP 136/74  Pulse 68  Temp 97.9 F (36.6 C)  Wt 386 lb (175.088 kg)  She appears well, vital signs are as noted. Ears normal.  Throat and pharynx normal.  Neck supple. No adenopathy in the neck. Nose is congested. Sinuses non tender. The chest is clear, without wheezes or rales.  ASSESSMENT:   bronchitis  PLAN: Given duration and progression of symptoms, will treat for bacterial sinusitis with Zpack, as needed proair. Symptomatic therapy suggested: push fluids, rest and return office visit prn if symptoms persist or worsen.  Call or  return to clinic prn if these symptoms worsen or fail to improve as anticipated.

## 2012-08-02 NOTE — Patient Instructions (Addendum)
Take Zpack as directed.  Drink lots of fluids.  Treat sympotmatically with Mucinex, nasal saline irrigation, and Tylenol/Ibuprofen.  Call if not improving as expected in 5-7 days.    

## 2012-08-06 ENCOUNTER — Other Ambulatory Visit: Payer: Self-pay | Admitting: *Deleted

## 2012-08-06 MED ORDER — SITAGLIPTIN PHOSPHATE 100 MG PO TABS: 100.0000 mg | ORAL_TABLET | Freq: Every day | ORAL | Status: DC

## 2012-08-07 ENCOUNTER — Ambulatory Visit: Payer: Medicare Other | Admitting: Physical Medicine and Rehabilitation

## 2012-08-14 ENCOUNTER — Encounter
Payer: Medicare Other | Attending: Physical Medicine and Rehabilitation | Admitting: Physical Medicine and Rehabilitation

## 2012-08-14 ENCOUNTER — Encounter: Payer: Self-pay | Admitting: Family Medicine

## 2012-08-14 ENCOUNTER — Encounter: Payer: Self-pay | Admitting: Physical Medicine and Rehabilitation

## 2012-08-14 ENCOUNTER — Ambulatory Visit (INDEPENDENT_AMBULATORY_CARE_PROVIDER_SITE_OTHER): Payer: Medicare Other | Admitting: Family Medicine

## 2012-08-14 VITALS — BP 187/67 | HR 88 | Resp 16 | Ht 63.0 in | Wt 387.6 lb

## 2012-08-14 VITALS — BP 178/96 | HR 126 | Temp 98.2°F | Wt 387.8 lb

## 2012-08-14 DIAGNOSIS — E1142 Type 2 diabetes mellitus with diabetic polyneuropathy: Secondary | ICD-10-CM | POA: Insufficient documentation

## 2012-08-14 DIAGNOSIS — M545 Low back pain, unspecified: Secondary | ICD-10-CM | POA: Insufficient documentation

## 2012-08-14 DIAGNOSIS — G8929 Other chronic pain: Secondary | ICD-10-CM

## 2012-08-14 DIAGNOSIS — E1149 Type 2 diabetes mellitus with other diabetic neurological complication: Secondary | ICD-10-CM | POA: Insufficient documentation

## 2012-08-14 DIAGNOSIS — R000 Tachycardia, unspecified: Secondary | ICD-10-CM

## 2012-08-14 DIAGNOSIS — M25569 Pain in unspecified knee: Secondary | ICD-10-CM | POA: Insufficient documentation

## 2012-08-14 MED ORDER — METOPROLOL TARTRATE 25 MG PO TABS: 25.0000 mg | ORAL_TABLET | Freq: Two times a day (BID) | ORAL | Status: DC

## 2012-08-14 MED ORDER — HYDROCODONE-ACETAMINOPHEN 10-325 MG PO TABS: 1.0000 | ORAL_TABLET | Freq: Three times a day (TID) | ORAL | Status: DC

## 2012-08-14 MED ORDER — MORPHINE SULFATE ER 15 MG PO TBCR
15.0000 mg | EXTENDED_RELEASE_TABLET | Freq: Three times a day (TID) | ORAL | Status: DC
Start: 2012-08-14 — End: 2012-09-12

## 2012-08-14 NOTE — Patient Instructions (Signed)
Follow up with your PCP, for arrhythmia of heart beat. Try to continue with exercises for your legs in a sitting or lying position.

## 2012-08-14 NOTE — Progress Notes (Signed)
Subjective:    Patient ID: Theresa Huffman, female    DOB: [birth year], 66 y.o.   MRN: [id number]  HPI The patient complains about chronic bilateral knee pain . The patient also complains about low back pain. She states that her right hip pain has improved, therefore she does not want to consider x-rays of her hip yet.  The problem has been stable otherwise.  The patient reports that she went to a seminar about gastric bypass and lab band surgery, and is not thinking about those options at this point.  Pain Inventory Average Pain 8 Pain Right Now 8 My pain is burning and tingling  In the last 24 hours, has pain interfered with the following? General activity 8 Relation with others 7 Enjoyment of life 8 What TIME of day is your pain at its worst? morning and evening Sleep (in general) Poor  Pain is worse with: walking, standing and some activites Pain improves with: rest and medication Relief from Meds: 5  Mobility walk with assistance use a cane how many minutes can you walk? 10 ability to climb steps?  no do you drive?  yes  Function disabled: date disabled  I need assistance with the following:  meal prep, household duties and shopping  Neuro/Psych No problems in this area  Prior Studies Any changes since last visit?  no  Physicians involved in your care Any changes since last visit?  no   Family History  Problem Relation Age of Onset  . Cancer Mother     lung and colon  . Stroke Sister    History   Social History  . Marital Status: Married    Spouse Name: N/A    Number of Children: 1  . Years of Education: N/A   Occupational History  . Diability    Social History Main Topics  . Smoking status: Never Smoker   . Smokeless tobacco: Never Used  . Alcohol Use: No  . Drug Use: None  . Sexually Active: None   Other Topics Concern  . None   Social History Narrative   Lives in [place], one adult child   Past Surgical History  Procedure  Date  . Cholecystectomy   . Tubal ligation    Past Medical History  Diagnosis Date  . Hyperlipidemia   . Hypertension   . Low back pain   . Morbid obesity   . Diabetes mellitus     type II  . Thyroid disease     hypothyroidism  . GERD (gastroesophageal reflux disease)   . Lymphedema   . Lumbosacral spondylosis without myelopathy   . Pain in limb   . Lumbago   . Facet syndrome, lumbar   . Lymphedema   . Primary localized osteoarthrosis, lower leg   . Hypercholesteremia    BP 187/67  Pulse 88  Resp 16  Ht 5\' 3"  (1.6 m)  Wt 387 lb 9.6 oz (175.814 kg)  BMI 68.66 kg/m2  SpO2 93%    Review of Systems  Cardiovascular:       Irregular heart rate today  Musculoskeletal: Positive for back pain.  All other systems reviewed and are negative.       Objective:   Physical Exam Constitutional: She is oriented to person, place, and time. She appears well-developed and well-nourished.  Morbidly obese, walks with crutches  HENT:  Head: Normocephalic.  Neck: Neck supple.  Musculoskeletal: She exhibits tenderness.  Neurological: She is alert and oriented to person, place, and time.  Skin: Skin is warm and dry.  Psychiatric: She has a normal mood and affect.  Symmetric normal motor tone is noted throughout. Normal muscle bulk. Muscle testing reveals 5/5 muscle strength of the upper extremity, and 5/5 of the lower extremity, except iliopsoas on the right 4-/5. Full range of motion in upper and lower extremities. ROM of spine is restricted. Difficulties to elicit DTR, because of body habitus.  DTR in the upper and lower extremity are present and symmetric 2+. No clonus is noted.  Patient arises from chair without difficulty. Wide based gait with 2 crutches.         Assessment & Plan:  This is a 66 year old female with  1.Bilateral chronic knee pain  2.Low back pain  3.Morbidly obese  4.Peripheral neuropathy, s/p DM  Plan :  Continue with medication. Advised patient to do  some light exercising in a sitting or lying position. The patient states that she is not considering a gastric bypass or a lab band Surgery,at this point she states that she has been to a seminar for that . She is interested in seeing a psychologist for her food addiction. She will try to find out whether her insurance would pay for this. Refilled her MS Contin and hydrocodone today.  Patient had arrhythmic heart beat today, Sybil set up an appointment with her PCP right after this visit, to follow up on this. Follow up in 1 month.

## 2012-08-14 NOTE — Progress Notes (Signed)
Was at pain clinic this AM, irregular pulse noted.  She couldn't feel the change herself.  No CP.  She feels weaker and more tired than usual today.  Her ability to walk distance is diminished today.  Today clearly feels different than previous days, but this is nonspecific per patient.  Edema at baseline.  No h/o CAD.  No h/o irregular heartbeats.    H/o obesity, DM2- controlled, chronic pain noted.  Meds, vitals, and allergies reviewed.   ROS: See HPI.  Otherwise, noncontributory.  Nad, morbidly obese ncat Mmm IRR- ectopy noted, mildly tachy during exam Ctab, exam limited by habitus but no inc in wob abd soft, obese Ext with chronic appearing edematous changes

## 2012-08-14 NOTE — Patient Instructions (Addendum)
We'll contact you with your lab report. If you get chest pain or more short of breath, then dial 911 or go to the ER.  Theresa Huffman will call about your referral tomorrow. Go to [facility] and start the metoprolol tonight.  Take 1 pill twice a day.

## 2012-08-15 ENCOUNTER — Ambulatory Visit: Payer: Medicare Other | Admitting: Family Medicine

## 2012-08-15 DIAGNOSIS — R000 Tachycardia, unspecified: Secondary | ICD-10-CM | POA: Insufficient documentation

## 2012-08-15 LAB — CBC WITH DIFFERENTIAL/PLATELET
Basophils Relative: 0.4 % (ref 0.0–3.0)
Eosinophils Absolute: 0.4 10*3/uL (ref 0.0–0.7)
Eosinophils Relative: 4.4 % (ref 0.0–5.0)
Hemoglobin: 12.9 g/dL (ref 12.0–15.0)
Lymphocytes Relative: 26.9 % (ref 12.0–46.0)
MCHC: 32.5 g/dL (ref 30.0–36.0)
MCV: 87.8 fl (ref 78.0–100.0)
Neutro Abs: 5.5 10*3/uL (ref 1.4–7.7)
RBC: 4.52 Mil/uL (ref 3.87–5.11)

## 2012-08-15 LAB — COMPREHENSIVE METABOLIC PANEL
AST: 35 U/L (ref 0–37)
Alkaline Phosphatase: 100 U/L (ref 39–117)
BUN: 28 mg/dL — ABNORMAL HIGH (ref 6–23)
Creatinine, Ser: 1 mg/dL (ref 0.4–1.2)

## 2012-08-15 NOTE — Assessment & Plan Note (Signed)
A fib vs A flutter. No CP. D/w pt about path/phys and options.  She would like to avoid hospitalization.  She appears reasonable for for outpatient f/u.  Will ask for cards consult as outpatient, start BB in meantime and check basic labs today.  She agrees.  If more SOB or if CP, then ER or 911. She agrees. EGD reviewed. >25 min spent with face to face with patient, >50% counseling and/or coordinating care.

## 2012-08-16 ENCOUNTER — Encounter: Payer: Self-pay | Admitting: *Deleted

## 2012-08-17 ENCOUNTER — Encounter: Payer: Self-pay | Admitting: Cardiology

## 2012-08-17 ENCOUNTER — Ambulatory Visit (INDEPENDENT_AMBULATORY_CARE_PROVIDER_SITE_OTHER): Payer: Medicare Other | Admitting: Cardiology

## 2012-08-17 VITALS — BP 140/76 | HR 75 | Ht 63.0 in | Wt 387.0 lb

## 2012-08-17 DIAGNOSIS — I890 Lymphedema, not elsewhere classified: Secondary | ICD-10-CM

## 2012-08-17 DIAGNOSIS — R002 Palpitations: Secondary | ICD-10-CM

## 2012-08-17 DIAGNOSIS — R0602 Shortness of breath: Secondary | ICD-10-CM

## 2012-08-17 DIAGNOSIS — R000 Tachycardia, unspecified: Secondary | ICD-10-CM

## 2012-08-17 NOTE — Patient Instructions (Addendum)
Stop metoprolol.  Your physician has requested that you have an echocardiogram. Echocardiography is a painless test that uses sound waves to create images of your heart. It provides your doctor with information about the size and shape of your heart and how well your heart's chambers and valves are working. This procedure takes approximately one hour. There are no restrictions for this procedure.  Your physician has recommended that you wear a holter monitor. Holter monitors are medical devices that record the heart's electrical activity. Doctors most often use these monitors to diagnose arrhythmias. Arrhythmias are problems with the speed or rhythm of the heartbeat. The monitor is a small, portable device. You can wear one while you do your normal daily activities. This is usually used to diagnose what is causing palpitations/syncope (passing out). 48 hour monitor  You have been referred to [facility] physical therapist for treatment of lymphedema in your legs.  Your physician recommends that you schedule a follow-up appointment as needed with Dr Melvia Heaps.

## 2012-08-19 NOTE — Progress Notes (Signed)
Patient ID: Theresa Huffman, female   DOB: 1945-12-19, 66 y.o.   MRN: [id number] PCP: Dr. Dayton Martes  66 yo with history type II diabetes, HTN, and morbid obesity presents for evaluation of atrial fibrillation.  At baseline, patient is immobile, using an electronic scooter.  She is able to transfer using crutches.  This week, when seen at her pain management clinic, she was noted to have an irregular pulse.  She was sent to her PCP's office.  ECG at [facility] says "atrial fibrillation" on the report, but it actually showed sinus rhythm.  She is in NSR today in the office.  Metoprolol was started after her appointment at her PCP's office earlier this week.  Patient does report increased exertional dyspnea recently (though as noted above, she is not very active).  No chest pain.    ECG (10/16): report says atrial fibrillation but it actually shows NSR. ECG (10/18): NSR, normal  Labs (10/13): K 4.7, creatinine 1.0, TSH normal  PMH: 1. Hyperlipidemia 2. Type II diabetes 3. HTN 4. Obesity 5. Low back pain 6. Hypothyroidism 7. GERD 8. Lymphedema of both legs.  9. S/p CCY  SH: Nonsmoker, married, lives in [place].   FH: Sister with MI, another sister with CVA, brother with MI at 52.   ROS: All systems reviewed and negative except as per HPI.  Current Outpatient Prescriptions  Medication Sig Dispense Refill  . albuterol (PROAIR HFA) 108 (90 BASE) MCG/ACT inhaler Inhale 2 puffs into the lungs every 4 (four) hours as needed.  1 Inhaler  0  . aspirin 81 MG tablet Take 81 mg by mouth daily.        . Calcium Carbonate-Vitamin D 600-400 MG-UNIT per tablet 1200mg  once daily       . furosemide (LASIX) 40 MG tablet Take 1 tablet (40 mg total) by mouth 2 (two) times daily.  60 tablet  12  . gabapentin (NEURONTIN) 300 MG capsule Take two capsule by mouth three times daily  180 capsule  2  . glyBURIDE (DIABETA) 2.5 MG tablet Take two tablets by mouth 2 times daily  120 tablet  11  .  HYDROcodone-acetaminophen (NORCO) 10-325 MG per tablet Take 1 tablet by mouth 3 (three) times daily. As needed for back or leg pain  90 tablet  0  . ibuprofen (ADVIL,MOTRIN) 800 MG tablet Take 1 tablet (800 mg total) by mouth 3 (three) times daily.  90 tablet  2  . levothyroxine (SYNTHROID, LEVOTHROID) 175 MCG tablet Take 1 tablet (175 mcg total) by mouth daily.  30 tablet  6  . metFORMIN (GLUCOPHAGE) 1000 MG tablet Take one tablet by mouth two times daily.  30 tablet  6  . moexipril (UNIVASC) 15 MG tablet Take 1 tablet (15 mg total) by mouth daily.  30 tablet  6  . morphine (MS CONTIN) 15 MG 12 hr tablet Take 1 tablet (15 mg total) by mouth 3 (three) times daily.  90 tablet  0  . potassium chloride SA (K-DUR,KLOR-CON) 20 MEQ tablet Take 1 tablet (20 mEq total) by mouth daily.  30 tablet  6  . simvastatin (ZOCOR) 40 MG tablet Take 1 tablet (40 mg total) by mouth daily.  30 tablet  4  . sitaGLIPtin (JANUVIA) 100 MG tablet Take 1 tablet (100 mg total) by mouth daily.  30 tablet  0  . venlafaxine (EFFEXOR) 75 MG tablet Take three tablets by mouth every night at bedtime  90 tablet  3   BP  140/76  Pulse 75  Ht 5\' 3"  (1.6 m)  Wt 387 lb (175.542 kg)  BMI 68.55 kg/m2 General: NAD, morbidly obese.  Neck: No JVD, no thyromegaly or thyroid nodule.  Lungs: Clear to auscultation bilaterally with normal respiratory effort. CV: Nondisplaced PMI.  Heart regular S1/S2, no S3/S4, no murmur.  Bilateral leg lymphedema.  No carotid bruit.  Normal pedal pulses.  Abdomen: Soft, nontender, no hepatosplenomegaly, no distention.  Skin: Intact without lesions or rashes.  Neurologic: Alert and oriented x 3.  Psych: Normal affect. Extremities: No clubbing or cyanosis.  HEENT: Normal.   Assessment/Plan:  1. Atrial fibrillation: I actually do not see any evidence that she truly has atrial fibrillation.  ECG at [facility] and ECG here show NSR.  She does have a number of risk factors for atrial fibrillation (HTN,  diabetes, obesity).  Her pain management clinic was concerned about an irregular pulse she was having while at pain management.  I will get a 48 hour holter monitor to look for atrial fibrillation.  As no atrial fibrillation has been detected yet, stop metoprolol.  2. Exertional dyspnea: Likely due to morbid obesity, but symptoms have become worse.  I will get an echocardiogram.   3. Lymphedema: I will refer her to a lymphedema clinic.  I would not increase her Lasix at this time.    Marca Ancona 08/19/2012

## 2012-08-22 ENCOUNTER — Other Ambulatory Visit: Payer: Self-pay

## 2012-08-22 NOTE — Telephone Encounter (Signed)
Ok to refill.  TSH normal last week.

## 2012-08-22 NOTE — Telephone Encounter (Signed)
[facility] faxed refill levothyroxine 175 mcg. Pt recently seen by Dr Para March and Dr Shirlee Latch for palpitations and tachycardia.Please advise.

## 2012-08-23 MED ORDER — LEVOTHYROXINE SODIUM 175 MCG PO TABS: 175.0000 ug | ORAL_TABLET | Freq: Every day | ORAL | Status: DC

## 2012-08-28 ENCOUNTER — Ambulatory Visit (HOSPITAL_COMMUNITY): Payer: Medicare Other | Attending: Cardiology | Admitting: Radiology

## 2012-08-28 ENCOUNTER — Encounter (INDEPENDENT_AMBULATORY_CARE_PROVIDER_SITE_OTHER): Payer: Medicare Other

## 2012-08-28 ENCOUNTER — Other Ambulatory Visit: Payer: Self-pay

## 2012-08-28 DIAGNOSIS — I10 Essential (primary) hypertension: Secondary | ICD-10-CM | POA: Insufficient documentation

## 2012-08-28 DIAGNOSIS — R0989 Other specified symptoms and signs involving the circulatory and respiratory systems: Secondary | ICD-10-CM | POA: Insufficient documentation

## 2012-08-28 DIAGNOSIS — R0602 Shortness of breath: Secondary | ICD-10-CM

## 2012-08-28 DIAGNOSIS — R002 Palpitations: Secondary | ICD-10-CM

## 2012-08-28 DIAGNOSIS — R0609 Other forms of dyspnea: Secondary | ICD-10-CM | POA: Insufficient documentation

## 2012-08-28 DIAGNOSIS — I369 Nonrheumatic tricuspid valve disorder, unspecified: Secondary | ICD-10-CM | POA: Insufficient documentation

## 2012-08-28 DIAGNOSIS — E119 Type 2 diabetes mellitus without complications: Secondary | ICD-10-CM | POA: Insufficient documentation

## 2012-08-28 NOTE — Progress Notes (Signed)
Echocardiogram performed.  

## 2012-09-05 ENCOUNTER — Telehealth: Payer: Self-pay | Admitting: *Deleted

## 2012-09-05 NOTE — Telephone Encounter (Signed)
Dr Shirlee Latch reviewed monitor done 08/28/12. Rare PVCs and PACs. No significant arrhythmias. LMTCB

## 2012-09-10 NOTE — Telephone Encounter (Signed)
Pt.notified

## 2012-09-10 NOTE — Telephone Encounter (Signed)
Pt not available

## 2012-09-11 ENCOUNTER — Other Ambulatory Visit: Payer: Self-pay

## 2012-09-11 MED ORDER — IBUPROFEN 800 MG PO TABS: 800.0000 mg | ORAL_TABLET | Freq: Three times a day (TID) | ORAL | Status: DC

## 2012-09-12 ENCOUNTER — Encounter: Payer: Self-pay | Admitting: Physical Medicine and Rehabilitation

## 2012-09-12 ENCOUNTER — Encounter
Payer: Medicare Other | Attending: Physical Medicine and Rehabilitation | Admitting: Physical Medicine and Rehabilitation

## 2012-09-12 VITALS — BP 171/82 | HR 103 | Resp 16 | Ht 64.0 in | Wt 387.0 lb

## 2012-09-12 DIAGNOSIS — E7800 Pure hypercholesterolemia, unspecified: Secondary | ICD-10-CM | POA: Insufficient documentation

## 2012-09-12 DIAGNOSIS — G609 Hereditary and idiopathic neuropathy, unspecified: Secondary | ICD-10-CM | POA: Insufficient documentation

## 2012-09-12 DIAGNOSIS — E039 Hypothyroidism, unspecified: Secondary | ICD-10-CM | POA: Insufficient documentation

## 2012-09-12 DIAGNOSIS — M25569 Pain in unspecified knee: Secondary | ICD-10-CM

## 2012-09-12 DIAGNOSIS — E119 Type 2 diabetes mellitus without complications: Secondary | ICD-10-CM | POA: Insufficient documentation

## 2012-09-12 DIAGNOSIS — I10 Essential (primary) hypertension: Secondary | ICD-10-CM | POA: Insufficient documentation

## 2012-09-12 DIAGNOSIS — K219 Gastro-esophageal reflux disease without esophagitis: Secondary | ICD-10-CM | POA: Insufficient documentation

## 2012-09-12 DIAGNOSIS — M545 Low back pain, unspecified: Secondary | ICD-10-CM

## 2012-09-12 DIAGNOSIS — E785 Hyperlipidemia, unspecified: Secondary | ICD-10-CM | POA: Insufficient documentation

## 2012-09-12 DIAGNOSIS — G8929 Other chronic pain: Secondary | ICD-10-CM

## 2012-09-12 DIAGNOSIS — M25559 Pain in unspecified hip: Secondary | ICD-10-CM | POA: Insufficient documentation

## 2012-09-12 MED ORDER — MORPHINE SULFATE ER 15 MG PO TBCR: 15.0000 mg | EXTENDED_RELEASE_TABLET | Freq: Three times a day (TID) | ORAL | Status: DC

## 2012-09-12 MED ORDER — HYDROCODONE-ACETAMINOPHEN 10-325 MG PO TABS: 1.0000 | ORAL_TABLET | Freq: Three times a day (TID) | ORAL | Status: DC

## 2012-09-12 NOTE — Progress Notes (Signed)
Subjective:    Patient ID: Theresa Huffman, female    DOB: [birth year], 66 y.o.   MRN: [id number]  HPI The patient complains about chronic bilateral knee pain . The patient also complains about low back pain. She states that her right hip pain has improved, therefore she does not want to consider x-rays of her hip yet.  The problem has been stable otherwise.  The patient reports that she went to a seminar about gastric bypass and lab band surgery, and is not thinking about those options at this point.   Pain Inventory Average Pain 8 Pain Right Now 8 My pain is burning and aching  In the last 24 hours, has pain interfered with the following? General activity 8 Relation with others 7 Enjoyment of life 6 What TIME of day is your pain at its worst? morning and evening Sleep (in general) Poor  Pain is worse with: walking, bending and standing Pain improves with: rest and medication Relief from Meds: 6  Mobility walk with assistance how many minutes can you walk? 10 ability to climb steps?  no do you drive?  yes Do you have any goals in this area?  yes  Function disabled: date disabled 2005 I need assistance with the following:  meal prep, household duties and shopping Do you have any goals in this area?  yes  Neuro/Psych weakness depression  Prior Studies EKG  Physicians involved in your care Dr Shirlee Latch   Family History  Problem Relation Age of Onset  . Cancer Mother     lung and colon  . Stroke Sister    History   Social History  . Marital Status: Married    Spouse Name: N/A    Number of Children: 1  . Years of Education: N/A   Occupational History  . Diability    Social History Main Topics  . Smoking status: Never Smoker   . Smokeless tobacco: Never Used  . Alcohol Use: No  . Drug Use: None  . Sexually Active: None   Other Topics Concern  . None   Social History Narrative   Lives in [place], one adult child   Past Surgical History    Procedure Date  . Cholecystectomy   . Tubal ligation    Past Medical History  Diagnosis Date  . Hyperlipidemia   . Hypertension   . Low back pain   . Morbid obesity   . Diabetes mellitus     type II  . Thyroid disease     hypothyroidism  . GERD (gastroesophageal reflux disease)   . Lymphedema   . Lumbosacral spondylosis without myelopathy   . Pain in limb   . Lumbago   . Facet syndrome, lumbar   . Lymphedema   . Primary localized osteoarthrosis, lower leg   . Hypercholesteremia    BP 171/82  Pulse 103  Resp 16  Ht 5\' 4"  (1.626 m)  Wt 387 lb (175.542 kg)  BMI 66.43 kg/m2  SpO2 96%     Review of Systems  Musculoskeletal: Positive for myalgias, back pain, arthralgias and gait problem.  Neurological: Positive for weakness.  Psychiatric/Behavioral: Positive for dysphoric mood.  All other systems reviewed and are negative.       Objective:   Physical Exam Constitutional: She is oriented to person, place, and time. She appears well-developed and well-nourished.  Morbidly obese, walks with crutches  HENT:  Head: Normocephalic.  Neck: Neck supple.  Musculoskeletal: She exhibits tenderness.  Neurological: She is alert  and oriented to person, place, and time.  Skin: Skin is warm and dry.  Psychiatric: She has a normal mood and affect.  Symmetric normal motor tone is noted throughout. Normal muscle bulk. Muscle testing reveals 5/5 muscle strength of the upper extremity, and 5/5 of the lower extremity, except iliopsoas on the right 4-/5. Full range of motion in upper and lower extremities. ROM of spine is restricted. Difficulties to elicit DTR, because of body habitus.  No clonus is noted.  Patient arises from chair without difficulty. Wide based gait with 2 crutches.         Assessment & Plan:  This is a 66 year old female with  1.Bilateral chronic knee pain  2.Low back pain  3.Morbidly obese  4.Peripheral neuropathy, s/p DM  Plan :  Continue with  medication. Advised patient to do some light exercising in a sitting or lying position. The patient states that she is not considering a gastric bypass or a lab band Surgery,at this point she states that she has been to a seminar for that . She is interested in seeing a psychologist for her food addiction. She will try to find out whether her insurance would pay for this.  Refilled her MS Contin and hydrocodone today.  Patient had HTN today, 171/82, advised patient to follow up with her PCP as soon as possible.   Follow up in 1 month.

## 2012-09-12 NOTE — Patient Instructions (Addendum)
Try to loose weight, try to do some exercises in a sitting or recumbent position. Follow up with your PCP for your elevated BP, as soon as possible.

## 2012-09-14 ENCOUNTER — Other Ambulatory Visit: Payer: Self-pay

## 2012-09-14 ENCOUNTER — Other Ambulatory Visit: Payer: Self-pay | Admitting: *Deleted

## 2012-09-14 MED ORDER — IBUPROFEN 800 MG PO TABS: 800.0000 mg | ORAL_TABLET | Freq: Three times a day (TID) | ORAL | Status: DC

## 2012-09-14 NOTE — Telephone Encounter (Signed)
Pt left v/m is out of ibuprofen and request refill to [facility]; on 09/11/12 Dr Pamelia Hoit with [facility] refilled ibuprofen. Spoke with Rob at [facility] has not received refill. Spoke with Sybil Dr Leretha Dykes office and she will send now.Pt notified sent to [facility] by Dr Leretha Dykes office. [facility] notified also.

## 2012-09-26 ENCOUNTER — Encounter: Payer: Self-pay | Admitting: Family Medicine

## 2012-09-26 ENCOUNTER — Ambulatory Visit (INDEPENDENT_AMBULATORY_CARE_PROVIDER_SITE_OTHER): Payer: Medicare Other | Admitting: Family Medicine

## 2012-09-26 VITALS — BP 172/76 | HR 114 | Temp 98.2°F | Ht 63.0 in | Wt 396.2 lb

## 2012-09-26 DIAGNOSIS — J209 Acute bronchitis, unspecified: Secondary | ICD-10-CM

## 2012-09-26 MED ORDER — AZITHROMYCIN 250 MG PO TABS: ORAL_TABLET | ORAL | Status: DC

## 2012-09-26 MED ORDER — ALBUTEROL SULFATE HFA 108 (90 BASE) MCG/ACT IN AERS: 2.0000 | INHALATION_SPRAY | RESPIRATORY_TRACT | Status: DC | PRN

## 2012-09-26 NOTE — Progress Notes (Signed)
Subjective:    Patient ID: Theresa Huffman, female    DOB: 22-Apr-1946, 66 y.o.   MRN: [id number]  HPI Was here recently - was given zpak and inhaler for bronchitis  Done with abx Still using the inhaler- but almost out of it   Did improve a lot - but not 100% Cough got better and then worse  Getting worse for the past 3 days   Not like a cold at all  No nasal symptoms Ears and throat are fine   No underlying lung dz  Is diabetic   Coughing up a little mucous- yellow  And is wheezing - inhaler handles it well   No fever  No chills or body aches   Patient Active Problem List  Diagnosis  . HYPOTHYROIDISM  . DIABETES MELLITUS, TYPE II  . UNSPECIFIED VITAMIN D DEFICIENCY  . HYPERLIPIDEMIA  . OBESITY, MORBID  . HYPERTENSION  . LYMPHEDEMA, SEVERE  . UNSPECIFIED VENOUS INSUFFICIENCY  . ACUTE BRONCHITIS  . GERD  . Lumbago  . Ringworm  . Bilateral chronic knee pain  . Right hip pain  . Burning sensation of feet  . Tachycardia  . Exertional dyspnea   Past Medical History  Diagnosis Date  . Hyperlipidemia   . Hypertension   . Low back pain   . Morbid obesity   . Diabetes mellitus     type II  . Thyroid disease     hypothyroidism  . GERD (gastroesophageal reflux disease)   . Lymphedema   . Lumbosacral spondylosis without myelopathy   . Pain in limb   . Lumbago   . Facet syndrome, lumbar   . Lymphedema   . Primary localized osteoarthrosis, lower leg   . Hypercholesteremia    Past Surgical History  Procedure Date  . Cholecystectomy   . Tubal ligation    History  Substance Use Topics  . Smoking status: Never Smoker   . Smokeless tobacco: Never Used  . Alcohol Use: No   Family History  Problem Relation Age of Onset  . Cancer Mother     lung and colon  . Stroke Sister    Allergies  Allergen Reactions  . Erythromycin     REACTION: Nausea and vomiting   Current Outpatient Prescriptions on File Prior to Visit  Medication Sig Dispense Refill  .  albuterol (PROAIR HFA) 108 (90 BASE) MCG/ACT inhaler Inhale 2 puffs into the lungs every 4 (four) hours as needed.  1 Inhaler  0  . aspirin 81 MG tablet Take 81 mg by mouth daily.        . Calcium Carbonate-Vitamin D 600-400 MG-UNIT per tablet 1200mg  once daily       . furosemide (LASIX) 40 MG tablet Take 1 tablet (40 mg total) by mouth 2 (two) times daily.  60 tablet  12  . gabapentin (NEURONTIN) 300 MG capsule Take two capsule by mouth three times daily  180 capsule  2  . glyBURIDE (DIABETA) 2.5 MG tablet Take two tablets by mouth 2 times daily  120 tablet  11  . HYDROcodone-acetaminophen (NORCO) 10-325 MG per tablet Take 1 tablet by mouth 3 (three) times daily. As needed for back or leg pain  90 tablet  0  . ibuprofen (ADVIL,MOTRIN) 800 MG tablet Take 1 tablet (800 mg total) by mouth 3 (three) times daily.  90 tablet  2  . levothyroxine (SYNTHROID, LEVOTHROID) 175 MCG tablet Take 1 tablet (175 mcg total) by mouth daily.  30 tablet  6  . metFORMIN (GLUCOPHAGE) 1000 MG tablet Take one tablet by mouth two times daily.  30 tablet  6  . moexipril (UNIVASC) 15 MG tablet Take 1 tablet (15 mg total) by mouth daily.  30 tablet  6  . morphine (MS CONTIN) 15 MG 12 hr tablet Take 1 tablet (15 mg total) by mouth 3 (three) times daily.  90 tablet  0  . potassium chloride SA (K-DUR,KLOR-CON) 20 MEQ tablet Take 1 tablet (20 mEq total) by mouth daily.  30 tablet  6  . simvastatin (ZOCOR) 40 MG tablet Take 1 tablet (40 mg total) by mouth daily.  30 tablet  4  . venlafaxine (EFFEXOR) 75 MG tablet Take three tablets by mouth every night at bedtime  90 tablet  3      Review of Systems Review of Systems  Constitutional: Negative for fever, appetite change, fatigue and unexpected weight change.  ENt neg for nasal cong/ st or sinus pain  Eyes: Negative for pain and visual disturbance.  Respiratory: Negative for  shortness of breath.   Cardiovascular: Negative for cp or palpitations    Gastrointestinal: Negative  for nausea, diarrhea and constipation.  Genitourinary: Negative for urgency and frequency.  Skin: Negative for pallor or rash   Neurological: Negative for weakness, light-headedness, numbness and headaches.  Hematological: Negative for adenopathy. Does not bruise/bleed easily.  Psychiatric/Behavioral: Negative for dysphoric mood. The patient is not nervous/anxious.         Objective:   Physical Exam  Constitutional: She appears well-developed and well-nourished. No distress.       Morbidly obese and well appearing   HENT:  Head: Normocephalic and atraumatic.  Nose: Nose normal.  Mouth/Throat: Oropharynx is clear and moist. No oropharyngeal exudate.       No sinus tenderness   Eyes: Conjunctivae normal and EOM are normal. Pupils are equal, round, and reactive to light. Right eye exhibits no discharge. Left eye exhibits no discharge.  Neck: Normal range of motion. Neck supple.  Cardiovascular: Normal rate and regular rhythm.        Rate of 90 when at rest  Pulmonary/Chest: Effort normal and breath sounds normal. No respiratory distress. She has no wheezes. She has no rales. She exhibits no tenderness.       No wheeze even on forced expiration  Few isolated rhonchi bs are not distant Good air exch  Musculoskeletal: She exhibits no edema.  Lymphadenopathy:    She has no cervical adenopathy.  Neurological: She is alert. She has normal reflexes.  Skin: Skin is warm and dry. No rash noted.  Psychiatric: She has a normal mood and affect.          Assessment & Plan:

## 2012-09-26 NOTE — Patient Instructions (Addendum)
Get rest and drink fluids Take zpak as directed  Use inhaler as needed  Update if not starting to improve in a week or if worsening  - especially if wheezing worsens

## 2012-09-26 NOTE — Assessment & Plan Note (Signed)
Per pt this is recurrent - as she has lung damage from 2nd hand smoke and gets frequent bacterial bronchitis She does not think she ever got over last bout- but exam is very reassuring Given hx will tx with zpak and refilled inhaler (not wheezing on exam however)  Disc symptomatic care - see instructions on AVS  Update if not starting to improve in a week or if worsening  - will need cxr if no further imp

## 2012-10-11 ENCOUNTER — Encounter: Payer: Self-pay | Admitting: Physical Medicine and Rehabilitation

## 2012-10-11 ENCOUNTER — Encounter
Payer: Medicare Other | Attending: Physical Medicine and Rehabilitation | Admitting: Physical Medicine and Rehabilitation

## 2012-10-11 VITALS — BP 169/83 | HR 129 | Resp 18 | Ht 63.0 in | Wt 389.4 lb

## 2012-10-11 DIAGNOSIS — G8929 Other chronic pain: Secondary | ICD-10-CM | POA: Insufficient documentation

## 2012-10-11 DIAGNOSIS — M47817 Spondylosis without myelopathy or radiculopathy, lumbosacral region: Secondary | ICD-10-CM

## 2012-10-11 DIAGNOSIS — E1142 Type 2 diabetes mellitus with diabetic polyneuropathy: Secondary | ICD-10-CM | POA: Insufficient documentation

## 2012-10-11 DIAGNOSIS — M545 Low back pain, unspecified: Secondary | ICD-10-CM | POA: Insufficient documentation

## 2012-10-11 DIAGNOSIS — E1149 Type 2 diabetes mellitus with other diabetic neurological complication: Secondary | ICD-10-CM | POA: Insufficient documentation

## 2012-10-11 DIAGNOSIS — M25569 Pain in unspecified knee: Secondary | ICD-10-CM

## 2012-10-11 DIAGNOSIS — M47816 Spondylosis without myelopathy or radiculopathy, lumbar region: Secondary | ICD-10-CM

## 2012-10-11 DIAGNOSIS — M25561 Pain in right knee: Secondary | ICD-10-CM

## 2012-10-11 MED ORDER — MORPHINE SULFATE ER 15 MG PO TBCR: 15.0000 mg | EXTENDED_RELEASE_TABLET | Freq: Three times a day (TID) | ORAL | Status: DC

## 2012-10-11 MED ORDER — HYDROCODONE-ACETAMINOPHEN 10-325 MG PO TABS: 1.0000 | ORAL_TABLET | Freq: Three times a day (TID) | ORAL | Status: DC

## 2012-10-11 NOTE — Progress Notes (Signed)
Subjective:    Patient ID: Theresa Huffman, female    DOB: 04/01/1946, 66 y.o.   MRN: [id number]  HPI The patient complains about chronic bilateral knee pain . The patient also complains about low back pain. She states that her right hip pain has improved, therefore she does not want to consider x-rays of her hip yet.  The problem has been stable otherwise.  The patient reports that she went to a seminar about gastric bypass and lab band surgery, and is not thinking about those options at this point.  Pain Inventory Average Pain 8 Pain Right Now 8 My pain is burning and aching  In the last 24 hours, has pain interfered with the following? General activity 10 Relation with others 7 Enjoyment of life 7 What TIME of day is your pain at its worst? morning and night Sleep (in general) Fair  Pain is worse with: walking and standing Pain improves with: medication Relief from Meds: 6  Mobility walk with assistance how many minutes can you walk? 10 ability to climb steps?  no do you drive?  yes use a wheelchair Do you have any goals in this area?  yes  Function disabled: date disabled 2001 I need assistance with the following:  meal prep, household duties and shopping Do you have any goals in this area?  yes  Neuro/Psych trouble walking depression  Prior Studies Any changes since last visit?  no  Physicians involved in your care Any changes since last visit?  no   Family History  Problem Relation Age of Onset  . Cancer Mother     lung and colon  . Stroke Sister    History   Social History  . Marital Status: Married    Spouse Name: N/A    Number of Children: 1  . Years of Education: N/A   Occupational History  . Diability    Social History Main Topics  . Smoking status: Never Smoker   . Smokeless tobacco: Never Used  . Alcohol Use: No  . Drug Use: No  . Sexually Active: None   Other Topics Concern  . None   Social History Narrative   Lives in  [place], one adult child   Past Surgical History  Procedure Date  . Cholecystectomy   . Tubal ligation    Past Medical History  Diagnosis Date  . Hyperlipidemia   . Hypertension   . Low back pain   . Morbid obesity   . Diabetes mellitus     type II  . Thyroid disease     hypothyroidism  . GERD (gastroesophageal reflux disease)   . Lymphedema   . Lumbosacral spondylosis without myelopathy   . Pain in limb   . Lumbago   . Facet syndrome, lumbar   . Lymphedema   . Primary localized osteoarthrosis, lower leg   . Hypercholesteremia    BP 169/83  Pulse 129  Resp 18  Ht 5\' 3"  (1.6 m)  Wt 389 lb 6.4 oz (176.631 kg)  BMI 68.98 kg/m2  SpO2 90%    Review of Systems  Musculoskeletal: Positive for back pain and gait problem.  Psychiatric/Behavioral: Positive for dysphoric mood.  All other systems reviewed and are negative.       Objective:   Physical Exam Constitutional: She is oriented to person, place, and time. She appears well-developed and well-nourished.  Morbidly obese, walks with crutches  HENT:  Head: Normocephalic.  Neck: Neck supple.  Musculoskeletal: She exhibits tenderness.  Neurological: She is alert and oriented to person, place, and time.  Skin: Skin is warm and dry.  Psychiatric: She has a normal mood and affect.  Symmetric normal motor tone is noted throughout. Normal muscle bulk. Muscle testing reveals 5/5 muscle strength of the upper extremity, and 5/5 of the lower extremity, except iliopsoas on the right 4-/5. Full range of motion in upper and lower extremities. ROM of spine is restricted. Difficulties to elicit DTR, because of body habitus. No clonus is noted.  Patient arises from chair with difficulty. Wide based gait with 2 crutches.         Assessment & Plan:  This is a 66 year old female with  1.Bilateral chronic knee pain  2.Low back pain  3.Morbidly obese  4.Peripheral neuropathy, s/p DM  Plan :  Continue with medication.  Advised patient to do some light exercising in a sitting or lying position. The patient states that she is not considering a gastric bypass or a lab band Surgery,at this point she states that she has been to a seminar for that . She is interested in seeing a psychologist for her food addiction. She will try to find out whether her insurance would pay for this. Then I will refer her .  Refilled her MS Contin and hydrocodone today.    Follow up in 1 month.

## 2012-10-11 NOTE — Patient Instructions (Addendum)
Stay as active as tolerated. Try to find out whether your insurance would pay for a psychology visit, to help with her eating habits.

## 2012-10-22 ENCOUNTER — Other Ambulatory Visit: Payer: Self-pay | Admitting: *Deleted

## 2012-10-22 MED ORDER — POTASSIUM CHLORIDE CRYS ER 20 MEQ PO TBCR: 20.0000 meq | EXTENDED_RELEASE_TABLET | Freq: Every day | ORAL | Status: DC

## 2012-11-07 ENCOUNTER — Encounter: Payer: Medicare Other | Admitting: Physical Medicine and Rehabilitation

## 2012-11-08 ENCOUNTER — Telehealth: Payer: Self-pay | Admitting: *Deleted

## 2012-11-08 NOTE — Telephone Encounter (Signed)
Lypmphedema appt      Pricilla Holm     Sent: Wed November 07, 2012 11:28 AM         JENESIS MARTIN    MRN: [id number] DOB: 03-03-1946    Pt Home: [phone]               Message     Update on appointment  With [facility] - Lypmphedma     Spoke with [facility] Ms. Kewley is still on the wait list.   (#9th).  The are calling patient now to to get them in.  I will f/u again.

## 2012-11-12 ENCOUNTER — Encounter: Payer: Self-pay | Admitting: Physical Medicine and Rehabilitation

## 2012-11-12 ENCOUNTER — Encounter
Payer: Medicare Other | Attending: Physical Medicine and Rehabilitation | Admitting: Physical Medicine and Rehabilitation

## 2012-11-12 VITALS — BP 151/70 | HR 98 | Resp 14 | Ht 63.0 in | Wt 391.0 lb

## 2012-11-12 DIAGNOSIS — G8929 Other chronic pain: Secondary | ICD-10-CM

## 2012-11-12 DIAGNOSIS — M545 Low back pain, unspecified: Secondary | ICD-10-CM | POA: Insufficient documentation

## 2012-11-12 DIAGNOSIS — M25569 Pain in unspecified knee: Secondary | ICD-10-CM | POA: Insufficient documentation

## 2012-11-12 DIAGNOSIS — M47817 Spondylosis without myelopathy or radiculopathy, lumbosacral region: Secondary | ICD-10-CM

## 2012-11-12 DIAGNOSIS — E1142 Type 2 diabetes mellitus with diabetic polyneuropathy: Secondary | ICD-10-CM | POA: Insufficient documentation

## 2012-11-12 DIAGNOSIS — E1149 Type 2 diabetes mellitus with other diabetic neurological complication: Secondary | ICD-10-CM | POA: Insufficient documentation

## 2012-11-12 DIAGNOSIS — M47816 Spondylosis without myelopathy or radiculopathy, lumbar region: Secondary | ICD-10-CM

## 2012-11-12 DIAGNOSIS — M25562 Pain in left knee: Secondary | ICD-10-CM

## 2012-11-12 DIAGNOSIS — M62838 Other muscle spasm: Secondary | ICD-10-CM | POA: Insufficient documentation

## 2012-11-12 MED ORDER — HYDROCODONE-ACETAMINOPHEN 10-325 MG PO TABS: 1.0000 | ORAL_TABLET | Freq: Three times a day (TID) | ORAL | Status: DC

## 2012-11-12 MED ORDER — MORPHINE SULFATE ER 15 MG PO TBCR: 15.0000 mg | EXTENDED_RELEASE_TABLET | Freq: Three times a day (TID) | ORAL | Status: DC

## 2012-11-12 MED ORDER — METHOCARBAMOL 500 MG PO TABS: 500.0000 mg | ORAL_TABLET | Freq: Three times a day (TID) | ORAL | Status: DC

## 2012-11-12 NOTE — Patient Instructions (Signed)
Try to be as active as tolerated.

## 2012-11-12 NOTE — Progress Notes (Signed)
Subjective:    Patient ID: Theresa Huffman, female    DOB: October 25, 1946, 67 y.o.   MRN: [id number]  HPI The patient complains about chronic bilateral knee pain . The patient also complains about low back pain. She states that her right hip pain has improved, therefore she does not want to consider x-rays of her hip yet.  The problem has been stable otherwise.  The patient reports that she went to a seminar about gastric bypass and lab band surgery, and is not thinking about those options at this point. The patient complains about some muscle spasms/pain in her neck, from walking with her crutches.  Pain Inventory Average Pain 8 Pain Right Now 8 My pain is burning and aching  In the last 24 hours, has pain interfered with the following? General activity 9 Relation with others 9 Enjoyment of life 9 What TIME of day is your pain at its worst? all the time Sleep (in general) Fair  Pain is worse with: walking, bending and standing Pain improves with: rest and medication Relief from Meds: 6  Mobility walk without assistance ability to climb steps?  no do you drive?  yes use a wheelchair  Function disabled: date disabled  I need assistance with the following:  meal prep, household duties and shopping Do you have any goals in this area?  yes  Neuro/Psych weakness depression  Prior Studies Any changes since last visit?  no  Physicians involved in your care Any changes since last visit?  no   Family History  Problem Relation Age of Onset  . Cancer Mother     lung and colon  . Stroke Sister    History   Social History  . Marital Status: Married    Spouse Name: N/A    Number of Children: 1  . Years of Education: N/A   Occupational History  . Diability    Social History Main Topics  . Smoking status: Never Smoker   . Smokeless tobacco: Never Used  . Alcohol Use: No  . Drug Use: No  . Sexually Active: None   Other Topics Concern  . None   Social History  Narrative   Lives in [place], one adult child   Past Surgical History  Procedure Date  . Cholecystectomy   . Tubal ligation    Past Medical History  Diagnosis Date  . Hyperlipidemia   . Hypertension   . Low back pain   . Morbid obesity   . Diabetes mellitus     type II  . Thyroid disease     hypothyroidism  . GERD (gastroesophageal reflux disease)   . Lymphedema   . Lumbosacral spondylosis without myelopathy   . Pain in limb   . Lumbago   . Facet syndrome, lumbar   . Lymphedema   . Primary localized osteoarthrosis, lower leg   . Hypercholesteremia    BP 151/70  Pulse 98  Resp 14  Ht 5\' 3"  (1.6 m)  Wt 391 lb (177.356 kg)  BMI 69.26 kg/m2  SpO2 90%    Review of Systems  Musculoskeletal: Positive for back pain and gait problem.  Neurological: Positive for weakness.  Psychiatric/Behavioral: Positive for dysphoric mood.  All other systems reviewed and are negative.       Objective:   Physical Exam Constitutional: She is oriented to person, place, and time. She appears well-developed and well-nourished.  Morbidly obese, walks with crutches  HENT:  Head: Normocephalic.  Neck: Neck supple.  Musculoskeletal: She  exhibits tenderness.  Neurological: She is alert and oriented to person, place, and time.  Skin: Skin is warm and dry.  Psychiatric: She has a normal mood and affect.  Symmetric normal motor tone is noted throughout. Normal muscle bulk. Muscle testing reveals 5/5 muscle strength of the upper extremity, and 5/5 of the lower extremity, except iliopsoas on the right 4-/5. Full range of motion in upper and lower extremities. ROM of spine is restricted. Difficulties to elicit DTR, because of body habitus. No clonus is noted.  Patient arises from chair with difficulty. Wide based gait with 2 crutches.         Assessment & Plan:  This is a 67 year old female with  1.Bilateral chronic knee pain  2.Low back pain  3.Morbidly obese  4.Peripheral  neuropathy, s/p DM  5. Muscle spasms/pain in her trapezius, prescribed Robaxin 500mg  tid Plan :  Continue with medication. Advised patient to do some light exercising in a sitting or lying position. The patient states that she is not considering a gastric bypass or a lab band Surgery,at this point she states that she has been to a seminar for that . She is interested in seeing a psychologist for her food addiction. She will try to find out whether her insurance would pay for this. Then I will refer her .  Refilled her MS Contin and hydrocodone today.  Discussed lifestyle changes , diet and exercising in great length, spent 25 min at least with patient. Follow up in 1 month.

## 2012-11-20 ENCOUNTER — Other Ambulatory Visit: Payer: Self-pay | Admitting: *Deleted

## 2012-11-20 MED ORDER — SIMVASTATIN 40 MG PO TABS: 40.0000 mg | ORAL_TABLET | Freq: Every day | ORAL | Status: DC

## 2012-11-29 ENCOUNTER — Other Ambulatory Visit: Payer: Self-pay | Admitting: *Deleted

## 2012-11-29 MED ORDER — GABAPENTIN 300 MG PO CAPS: ORAL_CAPSULE | ORAL | Status: DC

## 2012-12-11 ENCOUNTER — Encounter
Payer: Medicare Other | Attending: Physical Medicine and Rehabilitation | Admitting: Physical Medicine and Rehabilitation

## 2012-12-11 ENCOUNTER — Encounter: Payer: Self-pay | Admitting: Physical Medicine and Rehabilitation

## 2012-12-11 VITALS — BP 150/42 | HR 108 | Ht 63.0 in | Wt 391.0 lb

## 2012-12-11 DIAGNOSIS — K219 Gastro-esophageal reflux disease without esophagitis: Secondary | ICD-10-CM | POA: Insufficient documentation

## 2012-12-11 DIAGNOSIS — M542 Cervicalgia: Secondary | ICD-10-CM | POA: Insufficient documentation

## 2012-12-11 DIAGNOSIS — G8929 Other chronic pain: Secondary | ICD-10-CM | POA: Insufficient documentation

## 2012-12-11 DIAGNOSIS — M25569 Pain in unspecified knee: Secondary | ICD-10-CM | POA: Insufficient documentation

## 2012-12-11 DIAGNOSIS — I10 Essential (primary) hypertension: Secondary | ICD-10-CM | POA: Insufficient documentation

## 2012-12-11 DIAGNOSIS — M545 Low back pain, unspecified: Secondary | ICD-10-CM | POA: Insufficient documentation

## 2012-12-11 DIAGNOSIS — G609 Hereditary and idiopathic neuropathy, unspecified: Secondary | ICD-10-CM | POA: Insufficient documentation

## 2012-12-11 DIAGNOSIS — M62838 Other muscle spasm: Secondary | ICD-10-CM | POA: Insufficient documentation

## 2012-12-11 DIAGNOSIS — E785 Hyperlipidemia, unspecified: Secondary | ICD-10-CM | POA: Insufficient documentation

## 2012-12-11 DIAGNOSIS — M170 Bilateral primary osteoarthritis of knee: Secondary | ICD-10-CM

## 2012-12-11 DIAGNOSIS — Z6841 Body Mass Index (BMI) 40.0 and over, adult: Secondary | ICD-10-CM | POA: Insufficient documentation

## 2012-12-11 DIAGNOSIS — E7800 Pure hypercholesterolemia, unspecified: Secondary | ICD-10-CM | POA: Insufficient documentation

## 2012-12-11 DIAGNOSIS — Z5181 Encounter for therapeutic drug level monitoring: Secondary | ICD-10-CM

## 2012-12-11 DIAGNOSIS — E119 Type 2 diabetes mellitus without complications: Secondary | ICD-10-CM | POA: Insufficient documentation

## 2012-12-11 DIAGNOSIS — E039 Hypothyroidism, unspecified: Secondary | ICD-10-CM | POA: Insufficient documentation

## 2012-12-11 MED ORDER — HYDROCODONE-ACETAMINOPHEN 10-325 MG PO TABS: 1.0000 | ORAL_TABLET | Freq: Three times a day (TID) | ORAL | Status: DC

## 2012-12-11 MED ORDER — MORPHINE SULFATE ER 15 MG PO TBCR: 15.0000 mg | EXTENDED_RELEASE_TABLET | Freq: Three times a day (TID) | ORAL | Status: DC

## 2012-12-11 NOTE — Patient Instructions (Addendum)
Continue to try to loose weight, try to do some exercises in a sitting position .

## 2012-12-11 NOTE — Progress Notes (Signed)
Subjective:    Patient ID: Theresa Huffman, female    DOB: 06/07/1946, 66 y.o.   MRN: [id number]  HPI The patient complains about chronic bilateral knee pain . The patient also complains about low back pain. She states that her right hip pain has improved, therefore she does not want to consider x-rays of her hip yet.  The problem has been stable otherwise.  The patient reports that she went to a seminar about gastric bypass and lab band surgery, and is not thinking about those options at this point.  The patient states, that she really wants to be able to walk more, but pain and weight interferes with this.  Pain Inventory Average Pain 8 Pain Right Now 8 My pain is burning and aching  In the last 24 hours, has pain interfered with the following? General activity 8 Relation with others 6 Enjoyment of life 8 What TIME of day is your pain at its worst? varies Sleep (in general) Fair  Pain is worse with: some activites Pain improves with: rest and medication Relief from Meds: 5  Mobility walk with assistance ability to climb steps?  no do you drive?  yes Do you have any goals in this area?  yes  Function disabled: date disabled unknown I need assistance with the following:  meal prep, household duties and shopping  Neuro/Psych trouble walking depression  Prior Studies Any changes since last visit?  no  Physicians involved in your care Any changes since last visit?  no   Family History  Problem Relation Age of Onset  . Cancer Mother     lung and colon  . Stroke Sister    History   Social History  . Marital Status: Married    Spouse Name: N/A    Number of Children: 1  . Years of Education: N/A   Occupational History  . Diability    Social History Main Topics  . Smoking status: Never Smoker   . Smokeless tobacco: Never Used  . Alcohol Use: No  . Drug Use: No  . Sexually Active: None   Other Topics Concern  . None   Social History Narrative   Lives in [place], one adult child   Past Surgical History  Procedure Laterality Date  . Cholecystectomy    . Tubal ligation     Past Medical History  Diagnosis Date  . Hyperlipidemia   . Hypertension   . Low back pain   . Morbid obesity   . Diabetes mellitus     type II  . Thyroid disease     hypothyroidism  . GERD (gastroesophageal reflux disease)   . Lymphedema   . Lumbosacral spondylosis without myelopathy   . Pain in limb   . Lumbago   . Facet syndrome, lumbar   . Lymphedema   . Primary localized osteoarthrosis, lower leg   . Hypercholesteremia    BP 150/42  Pulse 108  Ht 5\' 3"  (1.6 m)  Wt 391 lb (177.356 kg)  BMI 69.28 kg/m2  SpO2 95%     Review of Systems  Musculoskeletal: Positive for gait problem.  Psychiatric/Behavioral: Positive for dysphoric mood.  All other systems reviewed and are negative.       Objective:   Physical Exam Constitutional: She is oriented to person, place, and time. She appears well-developed and well-nourished.  Morbidly obese, walks with crutches  HENT:  Head: Normocephalic.  Neck: Neck supple.  Musculoskeletal: She exhibits tenderness.  Neurological: She is alert and  oriented to person, place, and time.  Skin: Skin is warm and dry.  Psychiatric: She has a normal mood and affect.  Symmetric normal motor tone is noted throughout. Normal muscle bulk. Muscle testing reveals 5/5 muscle strength of the upper extremity, and 5/5 of the lower extremity, except iliopsoas on the right 4-/5. Full range of motion in upper and lower extremities. ROM of spine is restricted. Difficulties to elicit DTR, because of body habitus. No clonus is noted.  Patient arises from chair with difficulty. Wide based gait with 2 crutches.         Assessment & Plan:  This is a 67 year old female with  1.Bilateral chronic knee pain  2.Low back pain  3.Morbidly obese  4.Peripheral neuropathy, s/p DM  5. Muscle spasms/pain in her trapezius,  prescribed Robaxin 500mg  tid  Plan :  Continue with medication. Advised patient to do some light exercising in a sitting or lying position. The patient states that she is not considering a gastric bypass or a lab band Surgery,at this point she states that she has been to a seminar for that . She is interested in seeing a psychologist for her food addiction. She will try to find out whether her insurance would pay for this. Then I will refer her .  Ordered aquatic therapy , which will be very beneficial for her low back and her arthritic knees, it would also help her to loose weight. Refilled her MS Contin and hydrocodone today.  Discussed lifestyle changes , diet and exercising .  Follow up in 1 month.

## 2012-12-15 ENCOUNTER — Other Ambulatory Visit: Payer: Self-pay

## 2012-12-18 ENCOUNTER — Other Ambulatory Visit: Payer: Self-pay | Admitting: *Deleted

## 2012-12-18 DIAGNOSIS — I10 Essential (primary) hypertension: Secondary | ICD-10-CM

## 2012-12-18 MED ORDER — MOEXIPRIL HCL 15 MG PO TABS: 15.0000 mg | ORAL_TABLET | Freq: Every day | ORAL | Status: DC

## 2012-12-31 ENCOUNTER — Encounter: Payer: Self-pay | Admitting: Physical Medicine and Rehabilitation

## 2013-01-07 ENCOUNTER — Encounter
Payer: Medicare Other | Attending: Physical Medicine and Rehabilitation | Admitting: Physical Medicine and Rehabilitation

## 2013-01-07 ENCOUNTER — Encounter: Payer: Self-pay | Admitting: Physical Medicine and Rehabilitation

## 2013-01-07 VITALS — BP 160/85 | HR 103 | Resp 16 | Ht 63.0 in | Wt 391.0 lb

## 2013-01-07 DIAGNOSIS — M25569 Pain in unspecified knee: Secondary | ICD-10-CM

## 2013-01-07 DIAGNOSIS — E1142 Type 2 diabetes mellitus with diabetic polyneuropathy: Secondary | ICD-10-CM | POA: Insufficient documentation

## 2013-01-07 DIAGNOSIS — M5380 Other specified dorsopathies, site unspecified: Secondary | ICD-10-CM | POA: Insufficient documentation

## 2013-01-07 DIAGNOSIS — G8929 Other chronic pain: Secondary | ICD-10-CM

## 2013-01-07 DIAGNOSIS — M25559 Pain in unspecified hip: Secondary | ICD-10-CM | POA: Insufficient documentation

## 2013-01-07 DIAGNOSIS — M545 Low back pain, unspecified: Secondary | ICD-10-CM

## 2013-01-07 DIAGNOSIS — E1149 Type 2 diabetes mellitus with other diabetic neurological complication: Secondary | ICD-10-CM | POA: Insufficient documentation

## 2013-01-07 MED ORDER — HYDROCODONE-ACETAMINOPHEN 10-325 MG PO TABS: 1.0000 | ORAL_TABLET | Freq: Three times a day (TID) | ORAL | Status: DC

## 2013-01-07 MED ORDER — MORPHINE SULFATE ER 15 MG PO TBCR: 15.0000 mg | EXTENDED_RELEASE_TABLET | Freq: Three times a day (TID) | ORAL | Status: DC

## 2013-01-07 NOTE — Patient Instructions (Signed)
Start with aquatic therapy if possible.

## 2013-01-07 NOTE — Progress Notes (Signed)
Subjective:    Patient ID: Theresa Huffman, female    DOB: June 11, 1946, 67 y.o.   MRN: [id number]  HPI The patient complains about chronic bilateral knee pain . The patient also complains about low back pain. She states that her right hip pain has improved, therefore she does not want to consider x-rays of her hip yet.  The problem has been stable otherwise.  The patient reports that she went to a seminar about gastric bypass and lab band surgery, and is not thinking about those options at this point.  The patient states, that she really wants to be able to walk more, but pain and weight interferes with this.  Pain Inventory Average Pain 8 Pain Right Now 8 My pain is burning and aching  In the last 24 hours, has pain interfered with the following? General activity 8 Relation with others 6 Enjoyment of life 8 What TIME of day is your pain at its worst? morning and evening Sleep (in general) Poor  Pain is worse with: walking, bending and standing Pain improves with: rest and medication Relief from Meds: 6  Mobility walk with assistance how many minutes can you walk? 10 ability to climb steps?  no do you drive?  yes Do you have any goals in this area?  yes  Function disabled: date disabled 2001 Do you have any goals in this area?  no  Neuro/Psych weakness trouble walking depression  Prior Studies Any changes since last visit?  no  Physicians involved in your care Any changes since last visit?  no   Family History  Problem Relation Age of Onset  . Cancer Mother     lung and colon  . Stroke Sister    History   Social History  . Marital Status: Married    Spouse Name: N/A    Number of Children: 1  . Years of Education: N/A   Occupational History  . Diability    Social History Main Topics  . Smoking status: Never Smoker   . Smokeless tobacco: Never Used  . Alcohol Use: No  . Drug Use: No  . Sexually Active: None   Other Topics Concern  . None    Social History Narrative   Lives in [place], one adult child   Past Surgical History  Procedure Laterality Date  . Cholecystectomy    . Tubal ligation     Past Medical History  Diagnosis Date  . Hyperlipidemia   . Hypertension   . Low back pain   . Morbid obesity   . Diabetes mellitus     type II  . Thyroid disease     hypothyroidism  . GERD (gastroesophageal reflux disease)   . Lymphedema   . Lumbosacral spondylosis without myelopathy   . Pain in limb   . Lumbago   . Facet syndrome, lumbar   . Lymphedema   . Primary localized osteoarthrosis, lower leg   . Hypercholesteremia    BP 160/85  Pulse 103  Resp 16  Ht 5\' 3"  (1.6 m)  Wt 391 lb (177.356 kg)  BMI 69.28 kg/m2  SpO2 94%     Review of Systems  Musculoskeletal: Positive for back pain and gait problem.  Neurological: Positive for weakness.  Psychiatric/Behavioral: Positive for dysphoric mood.  All other systems reviewed and are negative.       Objective:   Physical Exam Constitutional: She is oriented to person, place, and time. She appears well-developed and well-nourished.  Morbidly obese, walks with  crutches  HENT:  Head: Normocephalic.  Neck: Neck supple.  Musculoskeletal: She exhibits tenderness.  Neurological: She is alert and oriented to person, place, and time.  Skin: Skin is warm and dry.  Psychiatric: She has a normal mood and affect.  Symmetric normal motor tone is noted throughout. Normal muscle bulk. Muscle testing reveals 5/5 muscle strength of the upper extremity, and 5/5 of the lower extremity, except iliopsoas on the right 4-/5. Full range of motion in upper and lower extremities. ROM of spine is restricted. Difficulties to elicit DTR, because of body habitus. No clonus is noted.  Patient arises from chair with difficulty. Wide based gait with 2 crutches.         Assessment & Plan:  This is a 67 year old female with  1.Bilateral chronic knee pain  2.Low back pain   3.Morbidly obese  4.Peripheral neuropathy, s/p DM  5. Muscle spasms/pain in her trapezius, prescribed Robaxin 500mg  tid  Plan :  Continue with medication. Advised patient to do some light exercising in a sitting or lying position. The patient states that she is not considering a gastric bypass or a lab band Surgery,at this point she states that she has been to a seminar for that . She is interested in seeing a psychologist for her food addiction. She will try to find out whether her insurance would pay for this. Then I will refer her .  Ordered aquatic therapy at the last visit , which would be very beneficial for her low back and her arthritic knees, it would also help her to loose weight. She has not heard back from [facility] yet, Diane will call there again.  Refilled her MS Contin and hydrocodone today.  Discussed lifestyle changes , diet and exercising .  Follow up in 1 month.

## 2013-01-14 ENCOUNTER — Other Ambulatory Visit: Payer: Self-pay

## 2013-01-14 MED ORDER — IBUPROFEN 800 MG PO TABS: 800.0000 mg | ORAL_TABLET | Freq: Three times a day (TID) | ORAL | Status: DC

## 2013-02-05 ENCOUNTER — Encounter: Payer: Self-pay | Admitting: Physical Medicine and Rehabilitation

## 2013-02-05 ENCOUNTER — Encounter
Payer: Medicare Other | Attending: Physical Medicine and Rehabilitation | Admitting: Physical Medicine and Rehabilitation

## 2013-02-05 VITALS — BP 200/75 | HR 115 | Resp 17 | Ht 63.0 in | Wt 392.0 lb

## 2013-02-05 DIAGNOSIS — M545 Low back pain, unspecified: Secondary | ICD-10-CM | POA: Insufficient documentation

## 2013-02-05 DIAGNOSIS — G8929 Other chronic pain: Secondary | ICD-10-CM | POA: Insufficient documentation

## 2013-02-05 DIAGNOSIS — E1149 Type 2 diabetes mellitus with other diabetic neurological complication: Secondary | ICD-10-CM | POA: Insufficient documentation

## 2013-02-05 DIAGNOSIS — M25561 Pain in right knee: Secondary | ICD-10-CM

## 2013-02-05 DIAGNOSIS — M25569 Pain in unspecified knee: Secondary | ICD-10-CM | POA: Insufficient documentation

## 2013-02-05 DIAGNOSIS — E1142 Type 2 diabetes mellitus with diabetic polyneuropathy: Secondary | ICD-10-CM | POA: Insufficient documentation

## 2013-02-05 DIAGNOSIS — M62838 Other muscle spasm: Secondary | ICD-10-CM | POA: Insufficient documentation

## 2013-02-05 MED ORDER — HYDROCODONE-ACETAMINOPHEN 10-325 MG PO TABS: 1.0000 | ORAL_TABLET | Freq: Three times a day (TID) | ORAL | Status: DC

## 2013-02-05 MED ORDER — MORPHINE SULFATE ER 15 MG PO TBCR: 15.0000 mg | EXTENDED_RELEASE_TABLET | Freq: Three times a day (TID) | ORAL | Status: DC

## 2013-02-05 NOTE — Progress Notes (Signed)
Subjective:    Patient ID: Theresa Huffman, female    DOB: 1946/10/31, 67 y.o.   MRN: [id number]  HPI The patient complains about chronic bilateral knee pain . The patient also complains about low back pain. She states that her right hip pain has improved, therefore she does not want to consider x-rays of her hip yet.  The problem has been stable otherwise.  The patient reports that she went to a seminar about gastric bypass and lab band surgery, and is not thinking about those options at this point.  The patient states, that she really wants to be able to walk more, but pain and weight interferes with this.  Pain Inventory Average Pain 8 Pain Right Now 8 My pain is burning and aching  In the last 24 hours, has pain interfered with the following? General activity 9 Relation with others 7 Enjoyment of life 8 What TIME of day is your pain at its worst? evening Sleep (in general) Poor  Pain is worse with: walking, bending and standing Pain improves with: rest and medication Relief from Meds: n/a  Mobility walk with assistance ability to climb steps?  no do you drive?  yes use a wheelchair Do you have any goals in this area?  yes  Function disabled: date disabled n/a I need assistance with the following:  meal prep, household duties and shopping  Neuro/Psych weakness trouble walking depression  Prior Studies Any changes since last visit?  no  Physicians involved in your care Any changes since last visit?  no   Family History  Problem Relation Age of Onset  . Cancer Mother     lung and colon  . Stroke Sister    History   Social History  . Marital Status: Married    Spouse Name: N/A    Number of Children: 1  . Years of Education: N/A   Occupational History  . Diability    Social History Main Topics  . Smoking status: Never Smoker   . Smokeless tobacco: Never Used  . Alcohol Use: No  . Drug Use: No  . Sexually Active: None   Other Topics Concern   . None   Social History Narrative   Lives in [place], one adult child   Past Surgical History  Procedure Laterality Date  . Cholecystectomy    . Tubal ligation     Past Medical History  Diagnosis Date  . Hyperlipidemia   . Hypertension   . Low back pain   . Morbid obesity   . Diabetes mellitus     type II  . Thyroid disease     hypothyroidism  . GERD (gastroesophageal reflux disease)   . Lymphedema   . Lumbosacral spondylosis without myelopathy   . Pain in limb   . Lumbago   . Facet syndrome, lumbar   . Lymphedema   . Primary localized osteoarthrosis, lower leg   . Hypercholesteremia    BP 200/75  Pulse 115  Resp 17  Ht 5\' 3"  (1.6 m)  Wt 392 lb (177.81 kg)  BMI 69.46 kg/m2  SpO2 93%      Review of Systems  Musculoskeletal: Positive for gait problem.  Neurological: Positive for weakness.  Psychiatric/Behavioral: Positive for dysphoric mood.  All other systems reviewed and are negative.       Objective:   Physical Exam Constitutional: She is oriented to person, place, and time. She appears well-developed and well-nourished.  Morbidly obese, walks with crutches  HENT:  Head:  Normocephalic.  Neck: Neck supple.  Musculoskeletal: She exhibits tenderness.  Neurological: She is alert and oriented to person, place, and time.  Skin: Skin is warm and dry.  Psychiatric: She has a normal mood and affect.  Symmetric normal motor tone is noted throughout. Normal muscle bulk. Muscle testing reveals 5/5 muscle strength of the upper extremity, and 5/5 of the lower extremity, except iliopsoas on the right 4-/5. Full range of motion in upper and lower extremities. ROM of spine is restricted. Difficulties to elicit DTR, because of body habitus. No clonus is noted.  Patient arises from chair with difficulty. Wide based gait with 2 crutches.         Assessment & Plan:  This is a 67 year old female with  1.Bilateral chronic knee pain  2.Low back pain   3.Morbidly obese  4.Peripheral neuropathy, s/p DM  5. Muscle spasms/pain in her trapezius, prescribed Robaxin 500mg  tid  Plan :  Continue with medication. Advised patient to do some light exercising in a sitting or lying position. The patient states that she is not considering a gastric bypass or a lab band Surgery,at this point she states that she has been to a seminar for that . She is interested in seeing a psychologist for her food addiction. She will try to find out whether her insurance would pay for this. Then I will refer her .  Ordered aquatic therapy at the last visit , which would be very beneficial for her low back and her arthritic knees, it would also help her to loose weight. She wants to hold of on this, she states, that she rather will do some exercises in her own pool which she most likely will fill next month. Refilled her MS Contin and hydrocodone today.  Discussed lifestyle changes , diet and exercising . Advised patient to follow up with her PCP for her HTN  Follow up in 1 month.

## 2013-02-05 NOTE — Patient Instructions (Signed)
Try to stay as active as tolerated 

## 2013-02-07 ENCOUNTER — Other Ambulatory Visit: Payer: Self-pay

## 2013-02-07 MED ORDER — METHOCARBAMOL 500 MG PO TABS: 500.0000 mg | ORAL_TABLET | Freq: Three times a day (TID) | ORAL | Status: DC

## 2013-02-25 ENCOUNTER — Other Ambulatory Visit: Payer: Self-pay | Admitting: *Deleted

## 2013-02-25 MED ORDER — FUROSEMIDE 40 MG PO TABS: 40.0000 mg | ORAL_TABLET | Freq: Two times a day (BID) | ORAL | Status: DC

## 2013-02-25 MED ORDER — METFORMIN HCL 1000 MG PO TABS: ORAL_TABLET | ORAL | Status: DC

## 2013-02-28 ENCOUNTER — Other Ambulatory Visit: Payer: Self-pay | Admitting: *Deleted

## 2013-02-28 MED ORDER — SIMVASTATIN 40 MG PO TABS: 40.0000 mg | ORAL_TABLET | Freq: Every day | ORAL | Status: DC

## 2013-03-05 ENCOUNTER — Telehealth: Payer: Self-pay

## 2013-03-05 NOTE — Telephone Encounter (Signed)
She should be coming in , in the next days for a refill of her meds, I will discuss it with her at this point.

## 2013-03-05 NOTE — Telephone Encounter (Signed)
Robaxin was denied, they prefer tizanidine.  Please advise.

## 2013-03-06 ENCOUNTER — Encounter
Payer: Medicare Other | Attending: Physical Medicine and Rehabilitation | Admitting: Physical Medicine and Rehabilitation

## 2013-03-06 ENCOUNTER — Encounter: Payer: Self-pay | Admitting: Physical Medicine and Rehabilitation

## 2013-03-06 VITALS — BP 162/86 | HR 107 | Resp 16 | Ht 63.0 in | Wt 392.0 lb

## 2013-03-06 DIAGNOSIS — M549 Dorsalgia, unspecified: Secondary | ICD-10-CM

## 2013-03-06 DIAGNOSIS — M545 Low back pain, unspecified: Secondary | ICD-10-CM | POA: Insufficient documentation

## 2013-03-06 DIAGNOSIS — M62838 Other muscle spasm: Secondary | ICD-10-CM | POA: Insufficient documentation

## 2013-03-06 DIAGNOSIS — G8929 Other chronic pain: Secondary | ICD-10-CM | POA: Insufficient documentation

## 2013-03-06 DIAGNOSIS — M542 Cervicalgia: Secondary | ICD-10-CM

## 2013-03-06 DIAGNOSIS — E1149 Type 2 diabetes mellitus with other diabetic neurological complication: Secondary | ICD-10-CM | POA: Insufficient documentation

## 2013-03-06 DIAGNOSIS — M25569 Pain in unspecified knee: Secondary | ICD-10-CM | POA: Insufficient documentation

## 2013-03-06 DIAGNOSIS — M25561 Pain in right knee: Secondary | ICD-10-CM

## 2013-03-06 DIAGNOSIS — E1142 Type 2 diabetes mellitus with diabetic polyneuropathy: Secondary | ICD-10-CM | POA: Insufficient documentation

## 2013-03-06 MED ORDER — TIZANIDINE HCL 4 MG PO TABS: 4.0000 mg | ORAL_TABLET | Freq: Three times a day (TID) | ORAL | Status: DC | PRN

## 2013-03-06 MED ORDER — HYDROCODONE-ACETAMINOPHEN 10-325 MG PO TABS: 1.0000 | ORAL_TABLET | Freq: Three times a day (TID) | ORAL | Status: DC

## 2013-03-06 MED ORDER — MORPHINE SULFATE ER 15 MG PO TBCR: 15.0000 mg | EXTENDED_RELEASE_TABLET | Freq: Three times a day (TID) | ORAL | Status: DC

## 2013-03-06 MED ORDER — DICLOFENAC SODIUM 1 % TD GEL: 2.0000 g | Freq: Four times a day (QID) | TRANSDERMAL | Status: DC

## 2013-03-06 NOTE — Patient Instructions (Addendum)
Stay as active as tolerated. Working out in the pool would be very beneficial for you, try to fill the pool as soon as possible.

## 2013-03-06 NOTE — Progress Notes (Signed)
Subjective:    Patient ID: Theresa Huffman, female    DOB: 26-Jun-1946, 67 y.o.   MRN: [id number]  HPI The patient complains about chronic bilateral knee pain . The patient also complains about low back pain. She states that her right hip pain has improved, therefore she does not want to consider x-rays of her hip yet.  The problem has been stable otherwise.  The patient reports that she went to a seminar about gastric bypass and lab band surgery, and is considering this.   The patient states, that she really wants to be able to walk more, but pain and weight interferes with this.  Pain Inventory Average Pain 8 Pain Right Now 8 My pain is burning and aching  In the last 24 hours, has pain interfered with the following? General activity 8 Relation with others 5 Enjoyment of life 7 What TIME of day is your pain at its worst? morning and evening Sleep (in general) Fair  Pain is worse with: walking, bending and standing Pain improves with: rest, heat/ice and medication Relief from Meds: 6  Mobility walk with assistance how many minutes can you walk? 10 ability to climb steps?  no do you drive?  yes use a wheelchair Do you have any goals in this area?  yes  Function disabled: date disabled . I need assistance with the following:  meal prep, household duties and shopping Do you have any goals in this area?  yes  Neuro/Psych trouble walking  Prior Studies Any changes since last visit?  no  Physicians involved in your care Any changes since last visit?  no   Family History  Problem Relation Age of Onset  . Cancer Mother     lung and colon  . Stroke Sister    History   Social History  . Marital Status: Married    Spouse Name: N/A    Number of Children: 1  . Years of Education: N/A   Occupational History  . Diability    Social History Main Topics  . Smoking status: Never Smoker   . Smokeless tobacco: Never Used  . Alcohol Use: No  . Drug Use: No  .  Sexually Active: None   Other Topics Concern  . None   Social History Narrative   Lives in [place], one adult child   Past Surgical History  Procedure Laterality Date  . Cholecystectomy    . Tubal ligation     Past Medical History  Diagnosis Date  . Hyperlipidemia   . Hypertension   . Low back pain   . Morbid obesity   . Diabetes mellitus     type II  . Thyroid disease     hypothyroidism  . GERD (gastroesophageal reflux disease)   . Lymphedema   . Lumbosacral spondylosis without myelopathy   . Pain in limb   . Lumbago   . Facet syndrome, lumbar   . Lymphedema   . Primary localized osteoarthrosis, lower leg   . Hypercholesteremia    BP 162/86  Pulse 107  Resp 16  Ht 5\' 3"  (1.6 m)  Wt 392 lb (177.81 kg)  BMI 69.46 kg/m2  SpO2 92%     Review of Systems  Musculoskeletal: Positive for back pain and gait problem.  All other systems reviewed and are negative.       Objective:   Physical Exam Constitutional: She is oriented to person, place, and time. She appears well-developed and well-nourished.  Morbidly obese, walks with crutches  HENT:  Head: Normocephalic.  Neck: Neck supple.  Musculoskeletal: She exhibits tenderness.  Neurological: She is alert and oriented to person, place, and time.  Skin: Skin is warm and dry.  Psychiatric: She has a normal mood and affect.  Symmetric normal motor tone is noted throughout. Normal muscle bulk. Muscle testing reveals 5/5 muscle strength of the upper extremity, and 5/5 of the lower extremity, except iliopsoas on the right 4-/5. Full range of motion in upper and lower extremities. ROM of spine is restricted. Difficulties to elicit DTR, because of body habitus. No clonus is noted.  Patient arises from chair with difficulty. Wide based gait with 2 crutches.        Assessment & Plan:  This is a 67 year old female with  1.Bilateral chronic knee pain, prescribed Voltaren gel, patient wants to get off the ibuprofen,  because she will not be able to take that much ibuprofen after she has a bariatric surgery.  2.Low back pain  3.Morbidly obese  4.Peripheral neuropathy, s/p DM  5. Muscle spasms/pain in her trapezius, prescribed Robaxin 500mg  tid, at the last visit, her insurance is not paying for this, prescribed Tizanidine 4mg  , bid today.  Plan :  Continue with medication. Advised patient to do some light exercising in a sitting or lying position. The patient states that she is not considering a gastric bypass or a lab band Surgery,at this point she states that she has been to a seminar for that . She is interested in seeing a psychologist for her food addiction. She will try to find out whether her insurance would pay for this. Then I will refer her .  Ordered aquatic therapy at the last visit , which would be very beneficial for her low back and her arthritic knees, it would also help her to loose weight. She wants to hold of on this, she states, that she rather will do some exercises in her own pool which she most likely will fill next month.  Refilled her MS Contin and hydrocodone today.  Discussed lifestyle changes , diet and exercising .   Follow up in 1 month.

## 2013-03-07 ENCOUNTER — Telehealth: Payer: Self-pay | Admitting: *Deleted

## 2013-03-07 NOTE — Telephone Encounter (Signed)
Form for diabetic supplies is on your desk.  I spoke with the patient and she does want these supplies.  Because she has medicare, she needs a reason to be checking her blood sugar twice a day, since she's not insulin dependent.  Please advise.

## 2013-03-08 NOTE — Telephone Encounter (Signed)
Form signed and on my desk. 

## 2013-03-08 NOTE — Telephone Encounter (Signed)
Form faxed

## 2013-03-18 ENCOUNTER — Other Ambulatory Visit: Payer: Self-pay | Admitting: *Deleted

## 2013-03-18 MED ORDER — LEVOTHYROXINE SODIUM 175 MCG PO TABS: 175.0000 ug | ORAL_TABLET | Freq: Every day | ORAL | Status: DC

## 2013-03-18 MED ORDER — GLYBURIDE 2.5 MG PO TABS: ORAL_TABLET | ORAL | Status: DC

## 2013-03-19 ENCOUNTER — Telehealth: Payer: Self-pay | Admitting: *Deleted

## 2013-03-19 MED ORDER — GLIPIZIDE 5 MG PO TABS: 5.0000 mg | ORAL_TABLET | Freq: Two times a day (BID) | ORAL | Status: DC

## 2013-03-19 NOTE — Telephone Encounter (Signed)
[facility] faxed form stating that glyburide will need prior auth.  Insurance prefers glipizide or glimeperide.  I checked with the patient and she states she has never taken either of these meds.  Please advise.

## 2013-03-19 NOTE — Telephone Encounter (Signed)
Advised patient and pharmacy of the change.

## 2013-03-19 NOTE — Telephone Encounter (Signed)
Rx for glipizide sent to pharmacy.

## 2013-04-05 ENCOUNTER — Other Ambulatory Visit: Payer: Self-pay | Admitting: Family Medicine

## 2013-04-05 ENCOUNTER — Encounter: Payer: Self-pay | Admitting: Physical Medicine and Rehabilitation

## 2013-04-05 ENCOUNTER — Encounter
Payer: Medicare Other | Attending: Physical Medicine and Rehabilitation | Admitting: Physical Medicine and Rehabilitation

## 2013-04-05 VITALS — BP 148/58 | HR 95 | Resp 16 | Ht 63.0 in | Wt >= 6400 oz

## 2013-04-05 DIAGNOSIS — M1710 Unilateral primary osteoarthritis, unspecified knee: Secondary | ICD-10-CM

## 2013-04-05 DIAGNOSIS — M25569 Pain in unspecified knee: Secondary | ICD-10-CM | POA: Insufficient documentation

## 2013-04-05 DIAGNOSIS — M545 Low back pain, unspecified: Secondary | ICD-10-CM | POA: Insufficient documentation

## 2013-04-05 DIAGNOSIS — E1142 Type 2 diabetes mellitus with diabetic polyneuropathy: Secondary | ICD-10-CM | POA: Insufficient documentation

## 2013-04-05 DIAGNOSIS — G8929 Other chronic pain: Secondary | ICD-10-CM | POA: Insufficient documentation

## 2013-04-05 DIAGNOSIS — M25559 Pain in unspecified hip: Secondary | ICD-10-CM | POA: Insufficient documentation

## 2013-04-05 DIAGNOSIS — M5380 Other specified dorsopathies, site unspecified: Secondary | ICD-10-CM | POA: Insufficient documentation

## 2013-04-05 DIAGNOSIS — M170 Bilateral primary osteoarthritis of knee: Secondary | ICD-10-CM

## 2013-04-05 DIAGNOSIS — E1149 Type 2 diabetes mellitus with other diabetic neurological complication: Secondary | ICD-10-CM | POA: Insufficient documentation

## 2013-04-05 MED ORDER — MORPHINE SULFATE ER 15 MG PO TBCR: 15.0000 mg | EXTENDED_RELEASE_TABLET | Freq: Three times a day (TID) | ORAL | Status: DC

## 2013-04-05 MED ORDER — HYDROCODONE-ACETAMINOPHEN 10-325 MG PO TABS: 1.0000 | ORAL_TABLET | Freq: Three times a day (TID) | ORAL | Status: DC

## 2013-04-05 MED ORDER — METHOCARBAMOL 500 MG PO TABS: 500.0000 mg | ORAL_TABLET | Freq: Three times a day (TID) | ORAL | Status: DC

## 2013-04-05 NOTE — Patient Instructions (Addendum)
Stay as active as tolerated. Follow up with your PCP for your swollen legs

## 2013-04-05 NOTE — Progress Notes (Signed)
Subjective:    Patient ID: Theresa Huffman, female    DOB: 1946-06-16, 68 y.o.   MRN: [id number]  HPI The patient complains about chronic bilateral knee pain . The patient also complains about low back pain. She states that her right hip pain has improved, therefore she does not want to consider x-rays of her hip yet.  The problem has been stable otherwise.  The patient reports that she went to a seminar about gastric bypass and lab band surgery, and is considering this.  The patient states, that she really wants to be able to walk more, but pain and weight interferes with this.  Pain Inventory Average Pain 8 Pain Right Now 7 My pain is burning and aching  In the last 24 hours, has pain interfered with the following? General activity 9 Relation with others 7 Enjoyment of life 9 What TIME of day is your pain at its worst? morning and evening Sleep (in general) Poor  Pain is worse with: walking, bending and standing Pain improves with: rest and medication Relief from Meds: 5  Mobility walk with assistance ability to climb steps?  no do you drive?  yes use a wheelchair transfers alone Do you have any goals in this area?  yes  Function disabled: date disabled . Do you have any goals in this area?  no  Neuro/Psych weakness trouble walking depression  Prior Studies Any changes since last visit?  no  Physicians involved in your care Any changes since last visit?  no   Family History  Problem Relation Age of Onset  . Cancer Mother     lung and colon  . Stroke Sister    History   Social History  . Marital Status: Married    Spouse Name: N/A    Number of Children: 1  . Years of Education: N/A   Occupational History  . Diability    Social History Main Topics  . Smoking status: Never Smoker   . Smokeless tobacco: Never Used  . Alcohol Use: No  . Drug Use: No  . Sexually Active: None   Other Topics Concern  . None   Social History Narrative   Lives  in [place], one adult child   Past Surgical History  Procedure Laterality Date  . Cholecystectomy    . Tubal ligation     Past Medical History  Diagnosis Date  . Hyperlipidemia   . Hypertension   . Low back pain   . Morbid obesity   . Diabetes mellitus     type II  . Thyroid disease     hypothyroidism  . GERD (gastroesophageal reflux disease)   . Lymphedema   . Lumbosacral spondylosis without myelopathy   . Pain in limb   . Lumbago   . Facet syndrome, lumbar   . Lymphedema   . Primary localized osteoarthrosis, lower leg   . Hypercholesteremia    BP 148/58  Pulse 95  Resp 16  Ht 5\' 3"  (1.6 m)  Wt 401 lb (181.892 kg)  BMI 71.05 kg/m2  SpO2 94%     Review of Systems  Musculoskeletal: Positive for gait problem.  All other systems reviewed and are negative.       Objective:   Physical Exam Constitutional: She is oriented to person, place, and time. She appears well-developed and well-nourished.  Morbidly obese, walks with crutches  HENT:  Head: Normocephalic.  Neck: Neck supple.  Musculoskeletal: She exhibits tenderness.  Neurological: She is alert and oriented  to person, place, and time.  Skin: Skin is warm and dry.  Psychiatric: She has a normal mood and affect.  Symmetric normal motor tone is noted throughout. Normal muscle bulk. Muscle testing reveals 5/5 muscle strength of the upper extremity, and 5/5 of the lower extremity, except iliopsoas on the right 4-/5. Full range of motion in upper and lower extremities. ROM of spine is restricted. Difficulties to elicit DTR, because of body habitus. No clonus is noted.  Patient arises from chair with difficulty. Wide based gait with 2 crutches.        Assessment & Plan:  This is a 67 year old female with  1.Bilateral chronic knee pain, prescribed Voltaren gel, patient wants to get off the ibuprofen, because she will not be able to take that much ibuprofen after she has a bariatric surgery.  2.Low back  pain  3.Morbidly obese  4.Peripheral neuropathy, s/p DM  5. Muscle spasms/pain in her trapezius, prescribed Robaxin 500mg  tid, at the last visit, her insurance is not paying for this, prescribed Tizanidine 4mg  , bid today. The patient wants to get back on the Robaxin, she states, that the Tizanidine is not as effective, I prescribed her the robaxin. Plan :  Continue with medication. Advised patient to do some light exercising in a sitting or lying position. The patient states that she is not considering a gastric bypass or a lab band Surgery,at this point she states that she has been to a seminar for that . She is interested in seeing a psychologist for her food addiction. She will try to find out whether her insurance would pay for this. Then I will refer her .  Ordered aquatic therapy at the last visit , which would be very beneficial for her low back and her arthritic knees, it would also help her to loose weight. She wants to hold of on this, she states, that she rather will do some exercises in her own pool which she most likely will fill next month.  Refilled her MS Contin and hydrocodone today.  Discussed lifestyle changes , diet and exercising .  The patient gained 9 lbs since the last visit, she thinks that it is mainly fluid, I advised her to follow up with her PCP on this, and also keep an eye on her BP, if it increases too much she should see her PCP at that time. Follow up in 1 month.

## 2013-04-15 ENCOUNTER — Other Ambulatory Visit: Payer: Self-pay

## 2013-04-15 MED ORDER — GABAPENTIN 300 MG PO CAPS: ORAL_CAPSULE | ORAL | Status: DC

## 2013-04-18 ENCOUNTER — Ambulatory Visit (INDEPENDENT_AMBULATORY_CARE_PROVIDER_SITE_OTHER): Payer: Medicare Other | Admitting: Family Medicine

## 2013-04-18 ENCOUNTER — Encounter: Payer: Self-pay | Admitting: Family Medicine

## 2013-04-18 VITALS — BP 142/80 | HR 88 | Temp 97.9°F | Wt 399.0 lb

## 2013-04-18 DIAGNOSIS — E1110 Type 2 diabetes mellitus with ketoacidosis without coma: Secondary | ICD-10-CM

## 2013-04-18 DIAGNOSIS — E1310 Other specified diabetes mellitus with ketoacidosis without coma: Secondary | ICD-10-CM

## 2013-04-18 DIAGNOSIS — E039 Hypothyroidism, unspecified: Secondary | ICD-10-CM

## 2013-04-18 DIAGNOSIS — L989 Disorder of the skin and subcutaneous tissue, unspecified: Secondary | ICD-10-CM

## 2013-04-18 LAB — TSH: TSH: 2.08 u[IU]/mL (ref 0.35–5.50)

## 2013-04-18 MED ORDER — DOXYCYCLINE HYCLATE 100 MG PO TABS: 100.0000 mg | ORAL_TABLET | Freq: Two times a day (BID) | ORAL | Status: DC

## 2013-04-18 NOTE — Progress Notes (Signed)
Subjective:    Patient ID: Theresa Huffman, female    DOB: October 24, 1946, 67 y.o.   MRN: [id number]  HPI  Very pleasant female with h/o morbid obesity, DM, HTN, hypothyroidism, here for:  ?bug bite.  Approximately 1 week ago, woke up with small bite mark on left side of face. Since then, it is larger and more painful.  Drained pus last couple of days and now much smaller. No fevers. No other rashes.  No myalgias, HA or blurred vision.  Patient Active Problem List   Diagnosis Date Noted  . Exertional dyspnea 08/19/2012  . Tachycardia 08/15/2012  . Bilateral chronic knee pain 01/11/2012  . Right hip pain 01/11/2012  . Burning sensation of feet 01/11/2012  . Ringworm 05/31/2011  . LYMPHEDEMA, SEVERE 09/01/2010  . ACUTE BRONCHITIS 09/01/2010  . HYPOTHYROIDISM 01/25/2010  . DIABETES MELLITUS, TYPE II 01/25/2010  . UNSPECIFIED VITAMIN D DEFICIENCY 01/25/2010  . HYPERLIPIDEMIA 01/25/2010  . OBESITY, MORBID 01/25/2010  . HYPERTENSION 01/25/2010  . UNSPECIFIED VENOUS INSUFFICIENCY 01/25/2010  . GERD 01/25/2010  . Lumbago 01/25/2010   Past Medical History  Diagnosis Date  . Hyperlipidemia   . Hypertension   . Low back pain   . Morbid obesity   . Diabetes mellitus     type II  . Thyroid disease     hypothyroidism  . GERD (gastroesophageal reflux disease)   . Lymphedema   . Lumbosacral spondylosis without myelopathy   . Pain in limb   . Lumbago   . Facet syndrome, lumbar   . Lymphedema   . Primary localized osteoarthrosis, lower leg   . Hypercholesteremia    Past Surgical History  Procedure Laterality Date  . Cholecystectomy    . Tubal ligation     History  Substance Use Topics  . Smoking status: Never Smoker   . Smokeless tobacco: Never Used  . Alcohol Use: No   Family History  Problem Relation Age of Onset  . Cancer Mother     lung and colon  . Stroke Sister    Allergies  Allergen Reactions  . Erythromycin     REACTION: Nausea and vomiting    Current Outpatient Prescriptions on File Prior to Visit  Medication Sig Dispense Refill  . albuterol (PROAIR HFA) 108 (90 BASE) MCG/ACT inhaler Inhale 2 puffs into the lungs every 4 (four) hours as needed.  1 Inhaler  1  . aspirin 81 MG tablet Take 81 mg by mouth daily.        . Calcium Carbonate-Vitamin D 600-400 MG-UNIT per tablet 1200mg  once daily       . diclofenac sodium (VOLTAREN) 1 % GEL Apply 2 g topically 4 (four) times daily.  2 Tube  2  . furosemide (LASIX) 40 MG tablet Take 1 tablet (40 mg total) by mouth 2 (two) times daily. *Needs appointment with Dr for additional refills*  60 tablet  1  . gabapentin (NEURONTIN) 300 MG capsule Take two capsule by mouth three times daily  180 capsule  5  . glipiZIDE (GLUCOTROL) 5 MG tablet Take 1 tablet (5 mg total) by mouth 2 (two) times daily before a meal.  60 tablet  3  . HYDROcodone-acetaminophen (NORCO) 10-325 MG per tablet Take 1 tablet by mouth 3 (three) times daily. As needed for back or leg pain  90 tablet  0  . ibuprofen (ADVIL,MOTRIN) 800 MG tablet Take 1 tablet (800 mg total) by mouth 3 (three) times daily.  90 tablet  2  .  JANUVIA 100 MG tablet       . levothyroxine (SYNTHROID, LEVOTHROID) 175 MCG tablet Take 1 tablet (175 mcg total) by mouth daily.  30 tablet  5  . metFORMIN (GLUCOPHAGE) 1000 MG tablet Take one tablet by mouth two times daily. *Needs appointment with Dr for additional refills*  60 tablet  1  . methocarbamol (ROBAXIN) 500 MG tablet Take 1 tablet (500 mg total) by mouth 3 (three) times daily.  90 tablet  1  . moexipril (UNIVASC) 15 MG tablet Take 1 tablet (15 mg total) by mouth daily.  30 tablet  5  . morphine (MS CONTIN) 15 MG 12 hr tablet Take 1 tablet (15 mg total) by mouth 3 (three) times daily.  90 tablet  0  . potassium chloride SA (K-DUR,KLOR-CON) 20 MEQ tablet Take 1 tablet (20 mEq total) by mouth daily.  30 tablet  5  . simvastatin (ZOCOR) 40 MG tablet TAKE ONE (1) TABLET BY MOUTH EVERY DAY  30 tablet  0  .  tiZANidine (ZANAFLEX) 4 MG tablet Take 1 tablet (4 mg total) by mouth every 8 (eight) hours as needed.  60 tablet  0  . venlafaxine (EFFEXOR) 75 MG tablet Take three tablets by mouth every night at bedtime  90 tablet  3   No current facility-administered medications on file prior to visit.   The PMH, PSH, Social History, Family History, Medications, and allergies have been reviewed in [facility], and have been updated if relevant.   Review of Systems See HPI    Objective:   Physical Exam  Constitutional: She appears well-developed. No distress.  HENT:  Head: Normocephalic.  Cardiovascular: Normal rate and regular rhythm.   Skin: Skin is warm and dry. Lesion noted.      BP 142/80  Pulse 88  Temp(Src) 97.9 F (36.6 C)  Wt 399 lb (180.985 kg)  BMI 70.7 kg/m2        Assessment & Plan:   1. Skin lesion of face ?infected bug bite vs abscess that has drained. Place on 10 day course of doxycyline 100 mg twice daily. Discussed supportive care- i.e., warm compresses. Call or return to clinic prn if these symptoms worsen or fail to improve as anticipated.  The patient indicates understanding of these issues and agrees with the plan.

## 2013-04-18 NOTE — Patient Instructions (Addendum)
Good to see you. Please take doxycyline 100 mg twice daily x 10 days. Please call me if it does not continue to improve.

## 2013-04-19 ENCOUNTER — Other Ambulatory Visit: Payer: Self-pay | Admitting: Family Medicine

## 2013-04-19 DIAGNOSIS — E1165 Type 2 diabetes mellitus with hyperglycemia: Secondary | ICD-10-CM

## 2013-04-22 ENCOUNTER — Other Ambulatory Visit: Payer: Self-pay | Admitting: Family Medicine

## 2013-04-25 ENCOUNTER — Other Ambulatory Visit: Payer: Self-pay | Admitting: Family Medicine

## 2013-04-25 NOTE — Telephone Encounter (Signed)
Opened in error

## 2013-04-30 ENCOUNTER — Encounter
Payer: Medicare Other | Attending: Physical Medicine and Rehabilitation | Admitting: Physical Medicine and Rehabilitation

## 2013-04-30 ENCOUNTER — Encounter: Payer: Self-pay | Admitting: Physical Medicine and Rehabilitation

## 2013-04-30 VITALS — BP 140/61 | HR 94 | Resp 18 | Ht 63.0 in | Wt 399.0 lb

## 2013-04-30 DIAGNOSIS — M1710 Unilateral primary osteoarthritis, unspecified knee: Secondary | ICD-10-CM

## 2013-04-30 DIAGNOSIS — E1142 Type 2 diabetes mellitus with diabetic polyneuropathy: Secondary | ICD-10-CM | POA: Insufficient documentation

## 2013-04-30 DIAGNOSIS — E7800 Pure hypercholesterolemia, unspecified: Secondary | ICD-10-CM | POA: Insufficient documentation

## 2013-04-30 DIAGNOSIS — Z79899 Other long term (current) drug therapy: Secondary | ICD-10-CM | POA: Insufficient documentation

## 2013-04-30 DIAGNOSIS — K219 Gastro-esophageal reflux disease without esophagitis: Secondary | ICD-10-CM | POA: Insufficient documentation

## 2013-04-30 DIAGNOSIS — G8929 Other chronic pain: Secondary | ICD-10-CM | POA: Insufficient documentation

## 2013-04-30 DIAGNOSIS — E039 Hypothyroidism, unspecified: Secondary | ICD-10-CM | POA: Insufficient documentation

## 2013-04-30 DIAGNOSIS — M5380 Other specified dorsopathies, site unspecified: Secondary | ICD-10-CM | POA: Insufficient documentation

## 2013-04-30 DIAGNOSIS — L989 Disorder of the skin and subcutaneous tissue, unspecified: Secondary | ICD-10-CM | POA: Insufficient documentation

## 2013-04-30 DIAGNOSIS — I10 Essential (primary) hypertension: Secondary | ICD-10-CM | POA: Insufficient documentation

## 2013-04-30 DIAGNOSIS — M545 Low back pain, unspecified: Secondary | ICD-10-CM | POA: Insufficient documentation

## 2013-04-30 DIAGNOSIS — E1149 Type 2 diabetes mellitus with other diabetic neurological complication: Secondary | ICD-10-CM | POA: Insufficient documentation

## 2013-04-30 DIAGNOSIS — M170 Bilateral primary osteoarthritis of knee: Secondary | ICD-10-CM

## 2013-04-30 DIAGNOSIS — M25569 Pain in unspecified knee: Secondary | ICD-10-CM | POA: Insufficient documentation

## 2013-04-30 MED ORDER — HYDROCODONE-ACETAMINOPHEN 10-325 MG PO TABS: 1.0000 | ORAL_TABLET | Freq: Three times a day (TID) | ORAL | Status: DC

## 2013-04-30 MED ORDER — MORPHINE SULFATE ER 15 MG PO TBCR: 15.0000 mg | EXTENDED_RELEASE_TABLET | Freq: Three times a day (TID) | ORAL | Status: DC

## 2013-04-30 NOTE — Progress Notes (Signed)
Subjective:    Patient ID: Theresa Huffman, female    DOB: 13-Oct-1946, 67 y.o.   MRN: [id number]  HPI The patient complains about chronic bilateral knee pain . The patient also complains about low back pain. She states that her right hip pain has improved, therefore she does not want to consider x-rays of her hip yet.  The problem has been stable otherwise.  The patient reports that she went to a seminar about gastric bypass and lab band surgery, and is considering this.  The patient states, that she really wants to be able to walk more, but pain and weight interferes with this. The patient reports that she saw her PCP for a lesion/infected bug bite on the left side of her face, she took Doxycycline for 10 days and the lesion has not resolved yet.  Pain Inventory Average Pain 8 Pain Right Now 8 My pain is burning and aching  In the last 24 hours, has pain interfered with the following? General activity 9 Relation with others 7 Enjoyment of life 7 What TIME of day is your pain at its worst? morning and evening Sleep (in general) Poor  Pain is worse with: walking, bending and standing Pain improves with: rest and medication Relief from Meds: 6  Mobility use a cane how many minutes can you walk? 10 ability to climb steps?  no do you drive?  yes use a wheelchair Do you have any goals in this area?  yes  Function disabled: date disabled . I need assistance with the following:  meal prep, household duties and shopping Do you have any goals in this area?  yes  Neuro/Psych weakness trouble walking  Prior Studies Any changes since last visit?  no  Physicians involved in your care Any changes since last visit?  no   Family History  Problem Relation Age of Onset  . Cancer Mother     lung and colon  . Stroke Sister    History   Social History  . Marital Status: Married    Spouse Name: N/A    Number of Children: 1  . Years of Education: N/A   Occupational  History  . Diability    Social History Main Topics  . Smoking status: Never Smoker   . Smokeless tobacco: Never Used  . Alcohol Use: No  . Drug Use: No  . Sexually Active: None   Other Topics Concern  . None   Social History Narrative   Lives in [place], one adult child   Past Surgical History  Procedure Laterality Date  . Cholecystectomy    . Tubal ligation     Past Medical History  Diagnosis Date  . Hyperlipidemia   . Hypertension   . Low back pain   . Morbid obesity   . Diabetes mellitus     type II  . Thyroid disease     hypothyroidism  . GERD (gastroesophageal reflux disease)   . Lymphedema   . Lumbosacral spondylosis without myelopathy   . Pain in limb   . Lumbago   . Facet syndrome, lumbar   . Lymphedema   . Primary localized osteoarthrosis, lower leg   . Hypercholesteremia    BP 140/61  Pulse 94  Resp 18  Ht 5\' 3"  (1.6 m)  Wt 399 lb (180.985 kg)  BMI 70.7 kg/m2  SpO2 94%     Review of Systems  Musculoskeletal: Positive for gait problem.  Neurological: Positive for weakness.  Psychiatric/Behavioral: Positive for dysphoric  mood.  All other systems reviewed and are negative.       Objective:   Physical Exam Constitutional: She is oriented to person, place, and time. She appears well-developed and well-nourished.  Morbidly obese, walks with crutches  HENT:  Head: Normocephalic.  Neck: Neck supple.  Musculoskeletal: She exhibits tenderness.  Neurological: She is alert and oriented to person, place, and time.  Skin: Skin is warm and dry. 1.5 cm lesion left side of her face, swollen and red Psychiatric: She has a normal mood and affect.  Symmetric normal motor tone is noted throughout. Normal muscle bulk. Muscle testing reveals 5/5 muscle strength of the upper extremity, and 5/5 of the lower extremity, except iliopsoas on the right 4-/5. Full range of motion in upper and lower extremities. ROM of spine is restricted. Difficulties to elicit  DTR, because of body habitus. No clonus is noted.  Patient arises from chair with difficulty. Wide based gait with 2 crutches.        Assessment & Plan:  This is a 67 year old female with  1.Bilateral chronic knee pain, prescribed Voltaren gel, patient wants to get off the ibuprofen, because she will not be able to take that much ibuprofen after she has a bariatric surgery.  2.Low back pain  3.Morbidly obese  4.Peripheral neuropathy, s/p DM  5. Muscle spasms/pain in her trapezius, prescribed Robaxin 500mg  tid, at the last visit, her insurance is not paying for this, prescribed Tizanidine 4mg  , bid today. The patient wants to get back on the Robaxin, she states, that the Tizanidine is not as effective, I prescribed her the robaxin. 6. The patient reports that she saw her PCP for a lesion/infected bug bite on the left side of her face, she took Doxycycline for 10 days and the lesion has not resolved yet.I advised her to go back to her PCP for further treatment or referral.  Plan :  Continue with medication. Advised patient to do some light exercising in a sitting or lying position. The patient states that she is not considering a gastric bypass or a lab band Surgery,at this point she states that she has been to a seminar for that . She is interested in seeing a psychologist for her food addiction. She will try to find out whether her insurance would pay for this. Then I will refer her .  Ordered aquatic therapy at the last visit , which would be very beneficial for her low back and her arthritic knees, it would also help her to loose weight. She wants to hold of on this, she states, that she rather will do some exercises in her own pool which she most likely will fill next month.  Refilled her MS Contin and hydrocodone today.  Discussed lifestyle changes , diet and exercising .   Follow up in 1 month.

## 2013-04-30 NOTE — Patient Instructions (Signed)
Try to stay as active as pain permits. 

## 2013-05-06 ENCOUNTER — Other Ambulatory Visit: Payer: Self-pay | Admitting: Family Medicine

## 2013-05-13 ENCOUNTER — Other Ambulatory Visit: Payer: Self-pay | Admitting: Family Medicine

## 2013-05-20 ENCOUNTER — Other Ambulatory Visit: Payer: Self-pay | Admitting: Family Medicine

## 2013-05-20 ENCOUNTER — Ambulatory Visit: Payer: Medicare Other | Admitting: Family Medicine

## 2013-05-24 ENCOUNTER — Encounter (HOSPITAL_COMMUNITY): Payer: Self-pay

## 2013-05-24 ENCOUNTER — Emergency Department (HOSPITAL_COMMUNITY)
Admission: EM | Admit: 2013-05-24 | Discharge: 2013-05-24 | Disposition: A | Discharge status: Home / Self Care | Payer: Medicare Other | Attending: Emergency Medicine | Admitting: Emergency Medicine

## 2013-05-24 DIAGNOSIS — E119 Type 2 diabetes mellitus without complications: Secondary | ICD-10-CM | POA: Insufficient documentation

## 2013-05-24 DIAGNOSIS — Z8739 Personal history of other diseases of the musculoskeletal system and connective tissue: Secondary | ICD-10-CM | POA: Insufficient documentation

## 2013-05-24 DIAGNOSIS — T464X5A Adverse effect of angiotensin-converting-enzyme inhibitors, initial encounter: Secondary | ICD-10-CM

## 2013-05-24 DIAGNOSIS — E785 Hyperlipidemia, unspecified: Secondary | ICD-10-CM | POA: Insufficient documentation

## 2013-05-24 DIAGNOSIS — Z7982 Long term (current) use of aspirin: Secondary | ICD-10-CM | POA: Insufficient documentation

## 2013-05-24 DIAGNOSIS — Z8719 Personal history of other diseases of the digestive system: Secondary | ICD-10-CM | POA: Insufficient documentation

## 2013-05-24 DIAGNOSIS — T783XXA Angioneurotic edema, initial encounter: Secondary | ICD-10-CM | POA: Insufficient documentation

## 2013-05-24 DIAGNOSIS — E7800 Pure hypercholesterolemia, unspecified: Secondary | ICD-10-CM | POA: Insufficient documentation

## 2013-05-24 DIAGNOSIS — I10 Essential (primary) hypertension: Secondary | ICD-10-CM | POA: Insufficient documentation

## 2013-05-24 DIAGNOSIS — E039 Hypothyroidism, unspecified: Secondary | ICD-10-CM | POA: Insufficient documentation

## 2013-05-24 DIAGNOSIS — T465X5A Adverse effect of other antihypertensive drugs, initial encounter: Secondary | ICD-10-CM | POA: Insufficient documentation

## 2013-05-24 NOTE — ED Notes (Signed)
PT states that her lip began to swell around 3pm on 7/24; pt states that the swelling has gotten a little worse since it began; pt denies any new medications or foods; pt states that the lip is not painful but just annoying; pt states that she has had a mild dull headache today; pt rates headache as a 4 on a scale of 0-10.

## 2013-05-24 NOTE — ED Notes (Signed)
Pt states her lip started to swell yesterday afternoon, tonight it became worse, no new medications or foods.

## 2013-05-24 NOTE — ED Provider Notes (Signed)
CSN: [id number]     Arrival date & time 05/24/13  0152 History     First MD Initiated Contact with Patient 05/24/13 0205     Chief Complaint  Patient presents with  . Oral Swelling   (Consider location/radiation/quality/duration/timing/severity/associated sxs/prior Treatment) HPI  Theresa Huffman is a 67 y.o. female complaining of right upper lip swelling worsening over 2 days. Pt denies SOB, wheezing, dyspepsia, rash, new environmental exposes or medications, tooth pain, pain redness or warmth at the site.Pt is taking Moexipril, which she has been on for years. States that she has a has a few prior similar episodes but this is more severe.   Past Medical History  Diagnosis Date  . Hyperlipidemia   . Hypertension   . Low back pain   . Morbid obesity   . Diabetes mellitus     type II  . Thyroid disease     hypothyroidism  . GERD (gastroesophageal reflux disease)   . Lymphedema   . Lumbosacral spondylosis without myelopathy   . Pain in limb   . Lumbago   . Facet syndrome, lumbar   . Lymphedema   . Primary localized osteoarthrosis, lower leg   . Hypercholesteremia    Past Surgical History  Procedure Laterality Date  . Cholecystectomy    . Tubal ligation     Family History  Problem Relation Age of Onset  . Cancer Mother     lung and colon  . Stroke Sister    History  Substance Use Topics  . Smoking status: Never Smoker   . Smokeless tobacco: Never Used  . Alcohol Use: No   OB History   Grav Para Term Preterm Abortions TAB SAB Ect Mult Living                 Review of Systems 10 systems reviewed and found to be negative, except as noted in the HPI   Allergies  Erythromycin  Home Medications   Current Outpatient Rx  Name  Route  Sig  Dispense  Refill  . albuterol (PROAIR HFA) 108 (90 BASE) MCG/ACT inhaler   Inhalation   Inhale 2 puffs into the lungs every 4 (four) hours as needed.   1 Inhaler   1   . aspirin 81 MG tablet   Oral   Take 81 mg by  mouth daily.           . Calcium Carbonate-Vitamin D 600-400 MG-UNIT per tablet      1200mg  once daily          . diclofenac sodium (VOLTAREN) 1 % GEL   Topical   Apply 2 g topically 4 (four) times daily.   2 Tube   2   . doxycycline (VIBRA-TABS) 100 MG tablet   Oral   Take 1 tablet (100 mg total) by mouth 2 (two) times daily.   20 tablet   0   . furosemide (LASIX) 40 MG tablet   Oral   Take 1 tablet (40 mg total) by mouth 2 (two) times daily. *Needs appointment with Dr for additional refills*   60 tablet   1   . gabapentin (NEURONTIN) 300 MG capsule      Take two capsule by mouth three times daily   180 capsule   5   . glipiZIDE (GLUCOTROL) 5 MG tablet   Oral   Take 1 tablet (5 mg total) by mouth 2 (two) times daily before a meal.   60 tablet   3   .  HYDROcodone-acetaminophen (NORCO) 10-325 MG per tablet   Oral   Take 1 tablet by mouth 3 (three) times daily. As needed for back or leg pain   90 tablet   0   . ibuprofen (ADVIL,MOTRIN) 800 MG tablet   Oral   Take 1 tablet (800 mg total) by mouth 3 (three) times daily.   90 tablet   2   . JANUVIA 100 MG tablet               . JANUVIA 100 MG tablet      TAKE ONE (1) TABLET BY MOUTH EVERY DAY   30 tablet   5   . levothyroxine (SYNTHROID, LEVOTHROID) 175 MCG tablet   Oral   Take 1 tablet (175 mcg total) by mouth daily.   30 tablet   5   . metFORMIN (GLUCOPHAGE) 1000 MG tablet      Take one tablet by mouth two times daily   60 tablet   5   . methocarbamol (ROBAXIN) 500 MG tablet   Oral   Take 1 tablet (500 mg total) by mouth 3 (three) times daily.   90 tablet   1   . moexipril (UNIVASC) 15 MG tablet      TAKE ONE (1) TABLET BY MOUTH EVERY DAY   30 tablet   2   . morphine (MS CONTIN) 15 MG 12 hr tablet   Oral   Take 1 tablet (15 mg total) by mouth 3 (three) times daily.   90 tablet   0   . potassium chloride SA (K-DUR,KLOR-CON) 20 MEQ tablet      TAKE ONE (1) TABLET BY MOUTH  EVERY DAY   30 tablet   5   . simvastatin (ZOCOR) 40 MG tablet      TAKE 1 TABLET BY MOUTH DAILY   30 tablet   5   . tiZANidine (ZANAFLEX) 4 MG tablet   Oral   Take 1 tablet (4 mg total) by mouth every 8 (eight) hours as needed.   60 tablet   0   . venlafaxine (EFFEXOR) 75 MG tablet      Take three tablets by mouth every night at bedtime   90 tablet   3    BP 156/74  Pulse 99  Temp(Src) 98.6 F (37 C) (Oral)  Resp 18  SpO2 91% Physical Exam  Nursing note and vitals reviewed. Constitutional: She is oriented to person, place, and time. She appears well-developed and well-nourished. No distress.  HENT:  Head: Normocephalic.  Mouth/Throat: Oropharynx is clear and moist.  Moderate swelling to right upper lip, no warmth, mimi al erythema, no TTP.   Oropharynx is patent with good dentition and no tongue or  Posterior pharynx edema.  Patient is handling their secretions. There is no tenderness to palpation or firmness underneath tongue bilaterally. No trismus.    Eyes: Conjunctivae and EOM are normal. Pupils are equal, round, and reactive to light.  Neck: Normal range of motion.  Cardiovascular: Normal rate.   Pulmonary/Chest: Effort normal and breath sounds normal. No stridor. No respiratory distress. She has no wheezes. She has no rales. She exhibits no tenderness.  Good air movement in all fields with no stridor or wheezing.   Abdominal: Soft. Bowel sounds are normal. She exhibits no distension and no mass. There is no tenderness. There is no rebound and no guarding.  Musculoskeletal: Normal range of motion.  Lymphadenopathy:    She has no cervical adenopathy.  Neurological:  She is alert and oriented to person, place, and time.  Psychiatric: She has a normal mood and affect.    ED Course   Procedures (including critical care time)  Labs Reviewed - No data to display No results found. No diagnosis found.  MDM   Filed Vitals:   05/24/13 0158  BP: 156/74   Pulse: 99  Temp: 98.6 F (37 C)  TempSrc: Oral  Resp: 18  SpO2: 91%     Theresa Huffman is a 67 y.o. female  Isolated painless right upper lip swelling and no airway compromise. PE not c/w infection. No new exposures, likely ACEI induced angioedema. Advised Pt to d/c Univasc, list ACE/ARBs as allergy and f/u with PCP for replacement HTN meds. Return precautions discussed at length.    Pt is hemodynamically stable, appropriate for, and amenable to discharge at this time. Pt verbalized understanding and agrees with care plan. All questions answered. Outpatient follow-up and specific return precautions discussed.    Note: Portions of this report may have been transcribed using voice recognition software. Every effort was made to ensure accuracy; however, inadvertent computerized transcription errors may be present    Wynetta Emery, PA-C 05/26/13 [phone]

## 2013-05-26 NOTE — ED Provider Notes (Signed)
Medical screening examination/treatment/procedure(s) were performed by non-physician practitioner and as supervising physician I was immediately available for consultation/collaboration.  John-Adam Murry Khiev, M.D.  John-Adam Brennen Camper, MD 05/26/13 0703 

## 2013-05-27 ENCOUNTER — Encounter: Payer: Self-pay | Admitting: Family Medicine

## 2013-05-27 ENCOUNTER — Other Ambulatory Visit: Payer: Self-pay

## 2013-05-27 ENCOUNTER — Ambulatory Visit (INDEPENDENT_AMBULATORY_CARE_PROVIDER_SITE_OTHER): Payer: Medicare Other | Admitting: Family Medicine

## 2013-05-27 VITALS — BP 150/82 | HR 108 | Temp 98.3°F | Wt 380.0 lb

## 2013-05-27 DIAGNOSIS — T783XXA Angioneurotic edema, initial encounter: Secondary | ICD-10-CM | POA: Insufficient documentation

## 2013-05-27 DIAGNOSIS — E785 Hyperlipidemia, unspecified: Secondary | ICD-10-CM

## 2013-05-27 DIAGNOSIS — T783XXD Angioneurotic edema, subsequent encounter: Secondary | ICD-10-CM

## 2013-05-27 DIAGNOSIS — Z5189 Encounter for other specified aftercare: Secondary | ICD-10-CM

## 2013-05-27 DIAGNOSIS — I10 Essential (primary) hypertension: Secondary | ICD-10-CM

## 2013-05-27 LAB — COMPREHENSIVE METABOLIC PANEL
ALT: 24 U/L (ref 0–35)
AST: 35 U/L (ref 0–37)
CO2: 26 mEq/L (ref 19–32)
GFR: 53.04 mL/min — ABNORMAL LOW (ref >60.00)
Sodium: 138 mEq/L (ref 135–145)
Total Bilirubin: 0.5 mg/dL (ref 0.3–1.2)
Total Protein: 8.6 g/dL — ABNORMAL HIGH (ref 6.0–8.3)

## 2013-05-27 LAB — LIPID PANEL
Total CHOL/HDL Ratio: 4
Triglycerides: 258 mg/dL — ABNORMAL HIGH (ref 0.0–149.0)
VLDL: 51.6 mg/dL — ABNORMAL HIGH (ref 0.0–40.0)

## 2013-05-27 MED ORDER — AMLODIPINE BESYLATE 5 MG PO TABS: 5.0000 mg | ORAL_TABLET | Freq: Every day | ORAL | Status: DC

## 2013-05-27 MED ORDER — SIMVASTATIN 20 MG PO TABS: ORAL_TABLET | ORAL | Status: DC

## 2013-05-27 MED ORDER — IBUPROFEN 800 MG PO TABS: 800.0000 mg | ORAL_TABLET | Freq: Three times a day (TID) | ORAL | Status: DC

## 2013-05-27 NOTE — Progress Notes (Signed)
Subjective:    Patient ID: Theresa Huffman, female    DOB: 1946-02-02, 67 y.o.   MRN: [id number]  HPI  67 yo with h/o morbid obesity, poorly controlled DM, HLD, HTN here for ER follow up.  Was seen on 05/24/2013.  Notes reviewed.  Acute onset of lip swelling and SOB. Diagnosed with angio edema, ACEI d/c'd.  Was not restarted on another agent.  Swelling and SOB have since resolved.  HLD- on Zocor 40 mg daily.  Has not had lipid panel in over 2 years.  DM- poorly controlled. Lab Results  Component Value Date   HGBA1C 8.2* 04/18/2013   Referred to endocrinology.  Patient Active Problem List   Diagnosis Date Noted  . Angioedema of lips 05/27/2013  . Skin lesion of face 04/18/2013  . Exertional dyspnea 08/19/2012  . Tachycardia 08/15/2012  . Bilateral chronic knee pain 01/11/2012  . Right hip pain 01/11/2012  . Burning sensation of feet 01/11/2012  . Ringworm 05/31/2011  . LYMPHEDEMA, SEVERE 09/01/2010  . HYPOTHYROIDISM 01/25/2010  . DIABETES MELLITUS, TYPE II 01/25/2010  . UNSPECIFIED VITAMIN D DEFICIENCY 01/25/2010  . HYPERLIPIDEMIA 01/25/2010  . OBESITY, MORBID 01/25/2010  . HYPERTENSION 01/25/2010  . UNSPECIFIED VENOUS INSUFFICIENCY 01/25/2010  . GERD 01/25/2010  . Lumbago 01/25/2010   Past Medical History  Diagnosis Date  . Hyperlipidemia   . Hypertension   . Low back pain   . Morbid obesity   . Diabetes mellitus     type II  . Thyroid disease     hypothyroidism  . GERD (gastroesophageal reflux disease)   . Lymphedema   . Lumbosacral spondylosis without myelopathy   . Pain in limb   . Lumbago   . Facet syndrome, lumbar   . Lymphedema   . Primary localized osteoarthrosis, lower leg   . Hypercholesteremia    Past Surgical History  Procedure Laterality Date  . Cholecystectomy    . Tubal ligation     History  Substance Use Topics  . Smoking status: Never Smoker   . Smokeless tobacco: Never Used  . Alcohol Use: No   Family History  Problem  Relation Age of Onset  . Cancer Mother     lung and colon  . Stroke Sister    Allergies  Allergen Reactions  . Ace Inhibitors     Angioedema   . Erythromycin     REACTION: Nausea and vomiting   Current Outpatient Prescriptions on File Prior to Visit  Medication Sig Dispense Refill  . albuterol (PROAIR HFA) 108 (90 BASE) MCG/ACT inhaler Inhale 2 puffs into the lungs every 4 (four) hours as needed.  1 Inhaler  1  . aspirin EC 81 MG tablet Take 162 mg by mouth daily.      . Calcium Carbonate-Vitamin D 600-400 MG-UNIT per tablet Take 1 tablet by mouth daily. 1200mg  once daily      . furosemide (LASIX) 40 MG tablet Take 1 tablet (40 mg total) by mouth 2 (two) times daily. *Needs appointment with Dr for additional refills*  60 tablet  1  . gabapentin (NEURONTIN) 300 MG capsule Take two capsule by mouth three times daily  180 capsule  5  . glipiZIDE (GLUCOTROL) 5 MG tablet Take 1 tablet (5 mg total) by mouth 2 (two) times daily before a meal.  60 tablet  3  . HYDROcodone-acetaminophen (NORCO) 10-325 MG per tablet Take 1 tablet by mouth 3 (three) times daily. As needed for back or leg pain  90 tablet  0  . JANUVIA 100 MG tablet TAKE ONE (1) TABLET BY MOUTH EVERY DAY  30 tablet  5  . levothyroxine (SYNTHROID, LEVOTHROID) 175 MCG tablet Take 1 tablet (175 mcg total) by mouth daily.  30 tablet  5  . metFORMIN (GLUCOPHAGE) 1000 MG tablet Take one tablet by mouth two times daily  60 tablet  5  . methocarbamol (ROBAXIN) 500 MG tablet Take 1 tablet (500 mg total) by mouth 3 (three) times daily.  90 tablet  1  . morphine (MS CONTIN) 15 MG 12 hr tablet Take 1 tablet (15 mg total) by mouth 3 (three) times daily.  90 tablet  0  . potassium chloride SA (K-DUR,KLOR-CON) 20 MEQ tablet TAKE ONE (1) TABLET BY MOUTH EVERY DAY  30 tablet  5  . tiZANidine (ZANAFLEX) 4 MG tablet Take 1 tablet (4 mg total) by mouth every 8 (eight) hours as needed.  60 tablet  0  . venlafaxine (EFFEXOR) 75 MG tablet Take three  tablets by mouth every night at bedtime  90 tablet  3   No current facility-administered medications on file prior to visit.   The PMH, PSH, Social History, Family History, Medications, and allergies have been reviewed in [facility], and have been updated if relevant.   Review of Systems Denies any CP, SOB or LE edema.    Objective:   Physical Exam BP 150/82  Pulse 108  Temp(Src) 98.3 F (36.8 C)  Wt 380 lb (172.367 kg)  BMI 67.33 kg/m2  SpO2 94% Gen:  Alert, morbidly obese, NAD Resp: CTA bilaterally CVS:  RRR Neuro:  Walks with cane     Assessment & Plan:  1. HYPERTENSION Deteriorated b/c antihypertensive agent was stopped. Will start Norvasc 5 mg daily. She has home BP cuff- she will call me in 2 weeks with BP reading.  2. HYPERLIPIDEMIA Decrease Zocor to 20 mg daily (due to possible interaction with norvasc). Last lipid panel- LDL 85.1. Recheck lipids today.  - Lipid Panel - Comprehensive metabolic panel  3. Angioedema of lips, subsequent encounter Resolved.  Assumed due to ACEI.  ACEI added to allergy list.

## 2013-05-27 NOTE — Patient Instructions (Addendum)
Good to see you. We are starting Norvasc 5 mg daily. Please call me in a couple of weeks with an update of your blood pressures.  We are decreasing Zocor 20 mg daily.  We will call you with your lab results.

## 2013-05-31 ENCOUNTER — Encounter: Payer: Self-pay | Admitting: Physical Medicine and Rehabilitation

## 2013-05-31 ENCOUNTER — Encounter
Payer: Medicare Other | Attending: Physical Medicine and Rehabilitation | Admitting: Physical Medicine and Rehabilitation

## 2013-05-31 ENCOUNTER — Telehealth: Payer: Self-pay

## 2013-05-31 VITALS — BP 152/76 | HR 100 | Resp 18 | Ht 63.0 in | Wt 390.0 lb

## 2013-05-31 DIAGNOSIS — Z79899 Other long term (current) drug therapy: Secondary | ICD-10-CM

## 2013-05-31 DIAGNOSIS — M545 Low back pain, unspecified: Secondary | ICD-10-CM | POA: Insufficient documentation

## 2013-05-31 DIAGNOSIS — M170 Bilateral primary osteoarthritis of knee: Secondary | ICD-10-CM

## 2013-05-31 DIAGNOSIS — E1149 Type 2 diabetes mellitus with other diabetic neurological complication: Secondary | ICD-10-CM | POA: Insufficient documentation

## 2013-05-31 DIAGNOSIS — M1710 Unilateral primary osteoarthritis, unspecified knee: Secondary | ICD-10-CM

## 2013-05-31 DIAGNOSIS — M25569 Pain in unspecified knee: Secondary | ICD-10-CM | POA: Insufficient documentation

## 2013-05-31 DIAGNOSIS — E1142 Type 2 diabetes mellitus with diabetic polyneuropathy: Secondary | ICD-10-CM | POA: Insufficient documentation

## 2013-05-31 DIAGNOSIS — G8929 Other chronic pain: Secondary | ICD-10-CM

## 2013-05-31 DIAGNOSIS — I10 Essential (primary) hypertension: Secondary | ICD-10-CM | POA: Insufficient documentation

## 2013-05-31 DIAGNOSIS — Z5181 Encounter for therapeutic drug level monitoring: Secondary | ICD-10-CM

## 2013-05-31 DIAGNOSIS — M5380 Other specified dorsopathies, site unspecified: Secondary | ICD-10-CM | POA: Insufficient documentation

## 2013-05-31 MED ORDER — HYDROCODONE-ACETAMINOPHEN 10-325 MG PO TABS: 1.0000 | ORAL_TABLET | Freq: Three times a day (TID) | ORAL | Status: DC

## 2013-05-31 MED ORDER — MORPHINE SULFATE ER 15 MG PO TBCR: 15.0000 mg | EXTENDED_RELEASE_TABLET | Freq: Three times a day (TID) | ORAL | Status: DC

## 2013-05-31 NOTE — Progress Notes (Signed)
Subjective:    Patient ID: Theresa Huffman, female    DOB: July 07, 1946, 67 y.o.   MRN: [id number]  HPI The patient complains about chronic bilateral knee pain . The patient also complains about low back pain. She states that her right hip pain has improved, therefore she does not want to consider x-rays of her hip yet.  The problem has been stable otherwise.  The patient reports that she went to a seminar about gastric bypass and lab band surgery, and is considering this.  The patient states, that she really wants to be able to walk more, but pain and weight interferes with this. She reports that she went to the ED, because of an allergic reaction to an ace-inhibitor, she followed up with her PCP, who prescribed another HTN medication. Pain Inventory Average Pain 8 Pain Right Now 8 My pain is burning and aching  In the last 24 hours, has pain interfered with the following? General activity 9 Relation with others 6 Enjoyment of life 9 What TIME of day is your pain at its worst? morning,evening Sleep (in general) Poor  Pain is worse with: walking, bending and standing Pain improves with: rest and medication Relief from Meds: 6  Mobility walk with assistance ability to climb steps?  no do you drive?  yes use a wheelchair Do you have any goals in this area?  yes  Function disabled: date disabled na I need assistance with the following:  meal prep, household duties and shopping Do you have any goals in this area?  no  Neuro/Psych weakness trouble walking depression  Prior Studies Any changes since last visit?  no  Physicians involved in your care Any changes since last visit?  yes   Family History  Problem Relation Age of Onset  . Cancer Mother     lung and colon  . Stroke Sister    History   Social History  . Marital Status: Married    Spouse Name: N/A    Number of Children: 1  . Years of Education: N/A   Occupational History  . Diability    Social  History Main Topics  . Smoking status: Never Smoker   . Smokeless tobacco: Never Used  . Alcohol Use: No  . Drug Use: No  . Sexually Active: None   Other Topics Concern  . None   Social History Narrative   Lives in [place], one adult child   Past Surgical History  Procedure Laterality Date  . Cholecystectomy    . Tubal ligation     Past Medical History  Diagnosis Date  . Hyperlipidemia   . Hypertension   . Low back pain   . Morbid obesity   . Diabetes mellitus     type II  . Thyroid disease     hypothyroidism  . GERD (gastroesophageal reflux disease)   . Lymphedema   . Lumbosacral spondylosis without myelopathy   . Pain in limb   . Lumbago   . Facet syndrome, lumbar   . Lymphedema   . Primary localized osteoarthrosis, lower leg   . Hypercholesteremia    BP 166/74  Pulse 131  Resp 18  Ht 5\' 3"  (1.6 m)  Wt 390 lb (176.903 kg)  BMI 69.1 kg/m2  SpO2 86%     Review of Systems  Musculoskeletal: Positive for gait problem.  Neurological: Positive for weakness.  Psychiatric/Behavioral: Positive for dysphoric mood.  All other systems reviewed and are negative.  Objective:   Physical Exam Constitutional: She is oriented to person, place, and time. She appears well-developed and well-nourished.  Morbidly obese, walks with crutches  HENT:  Head: Normocephalic.  Neck: Neck supple.  Musculoskeletal: She exhibits tenderness.  Neurological: She is alert and oriented to person, place, and time.  Skin: Skin is warm and dry. 1.5 cm lesion left side of her face, swollen and red Psychiatric: She has a normal mood and affect.  Symmetric normal motor tone is noted throughout. Normal muscle bulk. Muscle testing reveals 5/5 muscle strength of the upper extremity, and 5/5 of the lower extremity, except iliopsoas on the right 4-/5. Full range of motion in upper and lower extremities. ROM of spine is restricted. Difficulties to elicit DTR, because of body habitus. No  clonus is noted.  Patient arises from chair with difficulty. Wide based gait with 2 crutches.        Assessment & Plan:  This is a 67 year old female with  1.Bilateral chronic knee pain, prescribed Voltaren gel, patient wants to get off the ibuprofen, because she will not be able to take that much ibuprofen after she has a bariatric surgery.  2.Low back pain  3.Morbidly obese  4.Peripheral neuropathy, s/p DM  5. Muscle spasms/pain in her trapezius, prescribed Robaxin 500mg  tid, at the last visit, her insurance is not paying for this, prescribed Tizanidine 4mg  , bid today. The patient wants to get back on the Robaxin, she states, that the Tizanidine is not as effective, I prescribed her the robaxin.  6. The patient reports that she saw her PCP after she went to the ED, for an allergic reaction to an ace-inhibitor, she is now on another HTN med, and today her BP was 166/74, but after the visit it got down to 152/76. Advised patient to talk to her PCP, about HTN control. Plan :  Continue with medication. Advised patient to do some light exercising in a sitting or lying position. The patient states that she is not considering a gastric bypass or a lab band Surgery,at this point she states that she has been to a seminar for that . She is interested in seeing a psychologist for her food addiction. She will try to find out whether her insurance would pay for this. Then I will refer her .  Ordered aquatic therapy at the last visit , which would be very beneficial for her low back and her arthritic knees, it would also help her to loose weight. She wants to hold of on this, she states, that she rather will do some exercises in her own pool which she most likely will fill next month.  Refilled her MS Contin and hydrocodone today.  Discussed lifestyle changes , diet, especially salt decrease, and exercising .  Follow up in 1 month.

## 2013-05-31 NOTE — Patient Instructions (Signed)
Follow up with your PCP for your increased BP, and for your headaches asap.

## 2013-05-31 NOTE — Telephone Encounter (Signed)
Refilled hydrocodone 

## 2013-06-05 ENCOUNTER — Ambulatory Visit (INDEPENDENT_AMBULATORY_CARE_PROVIDER_SITE_OTHER): Payer: Medicare Other | Admitting: Internal Medicine

## 2013-06-05 ENCOUNTER — Encounter: Payer: Self-pay | Admitting: Internal Medicine

## 2013-06-05 VITALS — BP 130/72 | HR 115 | Temp 97.8°F | Resp 12 | Wt 380.0 lb

## 2013-06-05 DIAGNOSIS — E119 Type 2 diabetes mellitus without complications: Secondary | ICD-10-CM

## 2013-06-05 MED ORDER — CANAGLIFLOZIN 300 MG PO TABS: 300.0000 mg | ORAL_TABLET | Freq: Every day | ORAL | Status: DC

## 2013-06-05 MED ORDER — CANAGLIFLOZIN 100 MG PO TABS: 100.0000 mg | ORAL_TABLET | Freq: Every day | ORAL | Status: DC

## 2013-06-05 NOTE — Patient Instructions (Signed)
Please return in 1 month with your sugar log.  Stop Januvia. Start Invokana, initially at 100 mg daily in am - for 1 week, then increase to 300 mg daily in am if sugars still >150. If sugars <150, let me know and I will send more Invokana 100 mg to your pharmacy. Please restart your previous diet.  Let me know what strips to refill.  PATIENT INSTRUCTIONS FOR TYPE 2 DIABETES:  DIET AND EXERCISE Diet and exercise is an important part of diabetic treatment.  We recommended aerobic exercise in the form of brisk walking (working between 40-60% of maximal aerobic capacity, similar to brisk walking) for 150 minutes per week (such as 30 minutes five days per week) along with 3 times per week performing 'resistance' training (using various gauge rubber tubes with handles) 5-10 exercises involving the major muscle groups (upper body, lower body and core) performing 10-15 repetitions (or near fatigue) each exercise. Start at half the above goal but build slowly to reach the above goals. If limited by weight, joint pain, or disability, we recommend daily walking in a swimming pool with water up to waist to reduce pressure from joints while allow for adequate exercise.    BLOOD GLUCOSES Monitoring your blood glucoses is important for continued management of your diabetes. Please check your blood glucoses 2-4 times a day: fasting, before meals and at bedtime (you can rotate these measurements - e.g. one day check before the 3 meals, the next day check before 2 of the meals and before bedtime, etc.   HYPOGLYCEMIA (low blood sugar) Hypoglycemia is usually a reaction to not eating, exercising, or taking too much insulin/ other diabetes drugs.  Symptoms include tremors, sweating, hunger, confusion, headache, etc. Treat IMMEDIATELY with 15 grams of Carbs:   4 glucose tablets    cup regular juice/soda   2 tablespoons raisins   4 teaspoons sugar   1 tablespoon honey Recheck blood glucose in 15 mins and repeat  above if still symptomatic/blood glucose <100. Please contact our office at [phone] if you have questions about how to next handle your insulin.  RECOMMENDATIONS TO REDUCE YOUR RISK OF DIABETIC COMPLICATIONS: * Take your prescribed MEDICATION(S). * Follow a DIABETIC diet: Complex carbs, fiber rich foods, heart healthy fish twice weekly, (monounsaturated and polyunsaturated) fats * AVOID saturated/trans fats, high fat foods, >2,300 mg salt per day. * EXERCISE at least 5 times a week for 30 minutes or preferably daily.  * DO NOT SMOKE OR DRINK more than 1 drink a day. * Check your FEET every day. Do not wear tightfitting shoes. Contact us if you develop an ulcer * See your EYE doctor once a year or more if needed * Get a FLU shot once a year * Get a PNEUMONIA vaccine once before and once after age 44 years  GOALS:  * Your Hemoglobin A1c of <7%  * fasting sugars need to be <130 * after meals sugars need to be <180 (2h after you start eating) * Your Systolic BP should be 140 or lower  * Your Diastolic BP should be 80 or lower  * Your HDL (Good Cholesterol) should be 40 or higher  * Your LDL (Bad Cholesterol) should be 100 or lower  * Your Triglycerides should be 150 or lower  * Your Urine microalbumin (kidney function) should be <30 * Your Body Mass Index should be 25 or lower   We will be glad to help you achieve these goals. Our telephone number is:  [phone].  

## 2013-06-05 NOTE — Progress Notes (Signed)
Patient ID: Theresa Huffman, female   DOB: [birth year], 67 y.o.   MRN: [id number]  HPI: Theresa Huffman is a 67 y.o.-year-old female, referred by her PCP, Dr. Dayton Martes, for management of DM2, non-insulin-dependent, uncontrolled, with complications (Peripheral neuropathy).  Patient has been diagnosed with diabetes in ~2010; she has not been on insulin before. Last hemoglobin A1c was: Lab Results  Component Value Date   HGBA1C 8.2* 04/18/2013  Prev. HbA1C was 7.1% in 01/2012, prev. 8.5% in 04/2011. She believes her HbA1c increased due to getting back to eating sweats.   Pt is on a regimen of: - Metformin 1000 bid - Januvia 100 - added this after last A1c - expensive - Glipizide 5 bid  Pt checks her sugars once a day and they are: - am: 150-200 (before relaxing her diet: 100-105) Cannot remember what meter she has >> needs strips. No lows. Lowest sugar was 105; she has hypoglycemia awareness at 80. Highest sugar was 225.  Pt's meals are: - Breakfast: sausage/egg/cheese croissant - Lunch: ham snadwich - Dinner: meat + veg + starch - Snacks: 2 Pt is interested in seeing a psychologist for her food addiction. She was considering GBP, but is afraid of the sx.  - no CKD, last BUN/creatinine:  Lab Results  Component Value Date   BUN 21 05/27/2013   CREATININE 1.1 05/27/2013   - last set of lipids: Lab Results  Component Value Date   CHOL 161 05/27/2013   HDL 44.60 05/27/2013   LDLDIRECT 81.4 05/27/2013   TRIG 258.0* 05/27/2013   CHOLHDL 4 05/27/2013  She is on Zocor. - last eye exam was a year. No DR.  - + numbness and tingling in her feet - improved lately  I reviewed her chart and she also has a history of HTN, Hypothyroidism - last TSH 2.08 2 mo ago, vit D def., HL, morbid obesity, lymphedema, vv insuff., GERD, lumbago, chronic bilateral knee pain, h/o angioedema - ACEI stopped. No h/o UTIs.  Pt has FH of DM2 in sister.   ROS: Constitutional: no weight gain/loss, + fatigue, no  subjective hyperthermia/hypothermia; + poor sleep Eyes: no blurry vision, no xerophthalmia ENT: no sore throat, no nodules palpated in throat, no dysphagia/odynophagia, no hoarseness Cardiovascular: no CP/+ SOB/palpitations/+ leg swelling Respiratory: no cough/+SOB Gastrointestinal: no N/V/+ D/no C Musculoskeletal: no muscle/joint aches Skin: no rashes; + hair loss Neurological: no tremors/numbness/tingling/dizziness; + HA Psychiatric: no depression/anxiety  Past Medical History  Diagnosis Date  . Hyperlipidemia   . Hypertension   . Low back pain   . Morbid obesity   . Diabetes mellitus     type II  . Thyroid disease     hypothyroidism  . GERD (gastroesophageal reflux disease)   . Lymphedema   . Lumbosacral spondylosis without myelopathy   . Pain in limb   . Lumbago   . Facet syndrome, lumbar   . Lymphedema   . Primary localized osteoarthrosis, lower leg   . Hypercholesteremia    Past Surgical History  Procedure Laterality Date  . Cholecystectomy    . Tubal ligation     History   Social History  . Marital Status: Married    Spouse Name: N/A    Number of Children: 1   Occupational History  . Disability    Social History Main Topics  . Smoking status: Never Smoker   . Smokeless tobacco: Never Used  . Alcohol Use: No  . Drug Use: No   Social History Narrative  Lives in [place], one adult child      Caffeine use: coffee daily in am   Current Outpatient Prescriptions on File Prior to Visit  Medication Sig Dispense Refill  . albuterol (PROAIR HFA) 108 (90 BASE) MCG/ACT inhaler Inhale 2 puffs into the lungs every 4 (four) hours as needed.  1 Inhaler  1  . amLODipine (NORVASC) 5 MG tablet Take 1 tablet (5 mg total) by mouth daily.  90 tablet  3  . aspirin EC 81 MG tablet Take 162 mg by mouth daily.      . Calcium Carbonate-Vitamin D 600-400 MG-UNIT per tablet Take 1 tablet by mouth daily. 1200mg  once daily      . doxycycline (VIBRA-TABS) 100 MG tablet        . furosemide (LASIX) 40 MG tablet Take 1 tablet (40 mg total) by mouth 2 (two) times daily. *Needs appointment with Dr for additional refills*  60 tablet  1  . gabapentin (NEURONTIN) 300 MG capsule Take two capsule by mouth three times daily  180 capsule  5  . glipiZIDE (GLUCOTROL) 5 MG tablet Take 1 tablet (5 mg total) by mouth 2 (two) times daily before a meal.  60 tablet  3  . HYDROcodone-acetaminophen (NORCO) 10-325 MG per tablet Take 1 tablet by mouth 3 (three) times daily. As needed for back or leg pain  90 tablet  0  . ibuprofen (ADVIL,MOTRIN) 800 MG tablet Take 1 tablet (800 mg total) by mouth 3 (three) times daily.  90 tablet  2  . levothyroxine (SYNTHROID, LEVOTHROID) 175 MCG tablet Take 1 tablet (175 mcg total) by mouth daily.  30 tablet  5  . metFORMIN (GLUCOPHAGE) 1000 MG tablet Take one tablet by mouth two times daily  60 tablet  5  . methocarbamol (ROBAXIN) 500 MG tablet Take 1 tablet (500 mg total) by mouth 3 (three) times daily.  90 tablet  1  . moexipril (UNIVASC) 15 MG tablet       . morphine (MS CONTIN) 15 MG 12 hr tablet Take 1 tablet (15 mg total) by mouth 3 (three) times daily.  90 tablet  0  . potassium chloride SA (K-DUR,KLOR-CON) 20 MEQ tablet TAKE ONE (1) TABLET BY MOUTH EVERY DAY  30 tablet  5  . simvastatin (ZOCOR) 20 MG tablet TAKE 1 TABLET BY MOUTH DAILY  30 tablet  5  . tiZANidine (ZANAFLEX) 4 MG tablet Take 1 tablet (4 mg total) by mouth every 8 (eight) hours as needed.  60 tablet  0  . venlafaxine (EFFEXOR) 75 MG tablet Take three tablets by mouth every night at bedtime  90 tablet  3  . VOLTAREN 1 % GEL        No current facility-administered medications on file prior to visit.   Allergies  Allergen Reactions  . Ace Inhibitors     Angioedema   . Erythromycin     REACTION: Nausea and vomiting   Family History  Problem Relation Age of Onset  . Cancer Mother     lung and colon  . Stroke Sister    PE: BP 130/72  Pulse 115  Temp(Src) 97.8 F (36.6 C)  (Oral)  Resp 12  Wt 380 lb (172.367 kg)  BMI 67.33 kg/m2  SpO2 98% Wt Readings from Last 3 Encounters:  06/05/13 380 lb (172.367 kg)  05/31/13 390 lb (176.903 kg)  05/27/13 380 lb (172.367 kg)   Constitutional: obesity grade 3, in NAD Eyes: PERRLA, EOMI, no exophthalmos ENT:  moist mucous membranes, no thyromegaly, no cervical lymphadenopathy Cardiovascular: RRR, No MRG Respiratory: CTA B Gastrointestinal: abdomen soft, NT, ND, BS+ Musculoskeletal: strength intact in all 4 Skin: moist, warm, no rashes Neurological: no tremor with outstretched hands, DTR normal in all 4 Severe lymphedema bilateral legs, no skin breakage - rash on lateral left leg - x 3 years after an episode of cellulitis ASSESSMENT: 1. DM2, non-insulin-dependent, uncontrolled, with complications - PN, on Neurontin  PLAN:  1. Patient with long-standing, recently more uncontrolled diabetes, on oral antidiabetic regimen, started Januvia in last 2-3 months, but sugars still high (only checking in am) - We discussed about options for treatment, and I suggested to:  Stop Januvia since she cannot really afford it Continue Metformin 1000 mg bid Start Invokana 100 mg x 7 days, then increase to 300 mg daily - discussed benefits and SEs of Ivokana - she will let me know if dizziness or UTI - Strongly advised her to get back on her diet since she decreased her HbA1c to 7.1% when she was conscientious about it. We discussed about GBP and she tells me she is afraid to have the surgery. She did not check with her insurance if they cover her appt with a food addiction specialist - advised her to start checking her sugars at different times of the day - check 2 times a day, rotating checks - given sugar log and advised how to fill it and to bring it at next appt  - given foot care handout and explained the principles  - given instructions for hypoglycemia management "15-15 rule"  - advised for yearly eye exams - Return to clinic in  one month with sugar log - will need to check BUN/Cr and potassium then

## 2013-06-17 ENCOUNTER — Other Ambulatory Visit: Payer: Self-pay | Admitting: *Deleted

## 2013-06-17 MED ORDER — FUROSEMIDE 40 MG PO TABS: 40.0000 mg | ORAL_TABLET | Freq: Two times a day (BID) | ORAL | Status: DC

## 2013-07-02 ENCOUNTER — Encounter
Payer: Medicare Other | Attending: Physical Medicine and Rehabilitation | Admitting: Physical Medicine and Rehabilitation

## 2013-07-02 ENCOUNTER — Encounter: Payer: Self-pay | Admitting: Physical Medicine and Rehabilitation

## 2013-07-02 VITALS — BP 166/81 | HR 98 | Resp 14 | Ht 63.0 in | Wt 386.6 lb

## 2013-07-02 DIAGNOSIS — E1142 Type 2 diabetes mellitus with diabetic polyneuropathy: Secondary | ICD-10-CM | POA: Insufficient documentation

## 2013-07-02 DIAGNOSIS — M1710 Unilateral primary osteoarthritis, unspecified knee: Secondary | ICD-10-CM

## 2013-07-02 DIAGNOSIS — M545 Low back pain, unspecified: Secondary | ICD-10-CM | POA: Insufficient documentation

## 2013-07-02 DIAGNOSIS — M62838 Other muscle spasm: Secondary | ICD-10-CM | POA: Insufficient documentation

## 2013-07-02 DIAGNOSIS — I10 Essential (primary) hypertension: Secondary | ICD-10-CM | POA: Insufficient documentation

## 2013-07-02 DIAGNOSIS — IMO0002 Reserved for concepts with insufficient information to code with codable children: Secondary | ICD-10-CM

## 2013-07-02 DIAGNOSIS — M25569 Pain in unspecified knee: Secondary | ICD-10-CM

## 2013-07-02 DIAGNOSIS — G8929 Other chronic pain: Secondary | ICD-10-CM

## 2013-07-02 DIAGNOSIS — M25559 Pain in unspecified hip: Secondary | ICD-10-CM | POA: Insufficient documentation

## 2013-07-02 DIAGNOSIS — M170 Bilateral primary osteoarthritis of knee: Secondary | ICD-10-CM

## 2013-07-02 DIAGNOSIS — E1149 Type 2 diabetes mellitus with other diabetic neurological complication: Secondary | ICD-10-CM | POA: Insufficient documentation

## 2013-07-02 MED ORDER — HYDROCODONE-ACETAMINOPHEN 10-325 MG PO TABS: 1.0000 | ORAL_TABLET | Freq: Three times a day (TID) | ORAL | Status: DC

## 2013-07-02 MED ORDER — MORPHINE SULFATE ER 15 MG PO TBCR: 15.0000 mg | EXTENDED_RELEASE_TABLET | Freq: Three times a day (TID) | ORAL | Status: DC

## 2013-07-02 NOTE — Progress Notes (Signed)
Subjective:    Patient ID: Theresa Huffman, female    DOB: 07/20/1946, 67 y.o.   MRN: [id number]  HPI The patient complains about chronic bilateral knee pain . The patient also complains about low back pain. She states that her right hip pain has improved, therefore she does not want to consider x-rays of her hip yet.  The problem has been stable otherwise.  The patient reports that she went to a seminar about gastric bypass and lab band surgery, and is considering this in the future.  The patient states, that she really wants to be able to walk more, but pain and weight interferes with this.  She reports that she went to the ED, because of an allergic reaction to an ace-inhibitor, she followed up with her PCP, who prescribed another HTN medication.  Pain Inventory Average Pain 8 Pain Right Now 8 My pain is burning and aching  In the last 24 hours, has pain interfered with the following? General activity 9 Relation with others 6 Enjoyment of life 7 What TIME of day is your pain at its worst? morning and evening Sleep (in general) Fair  Pain is worse with: walking, bending and standing Pain improves with: rest and medication Relief from Meds: 6  Mobility walk without assistance how many minutes can you walk? 10 ability to climb steps?  no use a wheelchair Do you have any goals in this area?  yes  Function disabled: date disabled na Do you have any goals in this area?  no  Neuro/Psych weakness trouble walking  Prior Studies Any changes since last visit?  no  Physicians involved in your care Any changes since last visit?  no   Family History  Problem Relation Age of Onset  . Cancer Mother     lung and colon  . Stroke Sister    History   Social History  . Marital Status: Married    Spouse Name: N/A    Number of Children: 1  . Years of Education: N/A   Occupational History  . Diability    Social History Main Topics  . Smoking status: Never Smoker   .  Smokeless tobacco: Never Used  . Alcohol Use: No  . Drug Use: No  . Sexual Activity: None   Other Topics Concern  . None   Social History Narrative   Lives in [place], one adult child      Caffeine use: coffee daily in am   Past Surgical History  Procedure Laterality Date  . Cholecystectomy    . Tubal ligation     Past Medical History  Diagnosis Date  . Hyperlipidemia   . Hypertension   . Low back pain   . Morbid obesity   . Diabetes mellitus     type II  . Thyroid disease     hypothyroidism  . GERD (gastroesophageal reflux disease)   . Lymphedema   . Lumbosacral spondylosis without myelopathy   . Pain in limb   . Lumbago   . Facet syndrome, lumbar   . Lymphedema   . Primary localized osteoarthrosis, lower leg   . Hypercholesteremia    BP 166/81  Pulse 98  Resp 14  Ht 5\' 3"  (1.6 m)  Wt 386 lb 9.6 oz (175.361 kg)  BMI 68.5 kg/m2  SpO2 91%    Review of Systems  Musculoskeletal: Positive for gait problem.  Neurological: Positive for weakness.  All other systems reviewed and are negative.  Objective:   Physical Exam Constitutional: She is oriented to person, place, and time. She appears well-developed and well-nourished.  Morbidly obese, walks with crutches  HENT:  Head: Normocephalic.  Neck: Neck supple.  Musculoskeletal: She exhibits tenderness.  Neurological: She is alert and oriented to person, place, and time.  Skin: Skin is warm and dry.  Psychiatric: She has a normal mood and affect.  Symmetric normal motor tone is noted throughout. Normal muscle bulk. Muscle testing reveals 5/5 muscle strength of the upper extremity, and 5/5 of the lower extremity, except iliopsoas on the right 4-/5. Full range of motion in upper and lower extremities. ROM of spine is restricted. Difficulties to elicit DTR, because of body habitus. No clonus is noted.  Patient arises from chair with difficulty. Wide based gait with 2 crutches.        Assessment &  Plan:  This is a 67 year old female with  1.Bilateral chronic knee pain, prescribed Voltaren gel, patient wanted to get off the ibuprofen, because she will not be able to take that much ibuprofen after she has a bariatric surgery.She stated today that the Voltaren gel does not give her much relief, and she is taking the Ibuprofen again.  2.Low back pain  3.Morbidly obese  4.Peripheral neuropathy, s/p DM  5. Muscle spasms/pain in her trapezius, prescribed Robaxin 500mg  tid, but her insurance is not paying for this, prescribed Tizanidine 4mg  , bid, which did not help that much. The patient wants to get back on the Robaxin, she states, that the Tizanidine is not as effective, I prescribed her the robaxin again at the last visit.  6. The patient reports that she saw her PCP after she went to the ED, for an allergic reaction to an ace-inhibitor, she is now on another HTN med. Plan :  Continue with medication. Advised patient to do some light exercising in a sitting or lying position. The patient states that she is not considering a gastric bypass or a lab band Surgery,at this point she states that she has been to a seminar for that . She is interested in seeing a psychologist for her food addiction. She will try to find out whether her insurance would pay for this. Then I will refer her .  Ordered aquatic therapy again today , which would be very beneficial for her low back and her arthritic knees, it would also help her to loose weight. She wanted to hold of on this, at first, she states, that she rather will do some exercises in her own pool, which she then did not do. Therefor we will try the formal aquatic PT now.  Refilled her MS Contin and hydrocodone today.  Discussed lifestyle changes , diet, especially salt decrease, and exercising again .  Follow up in 1 month.

## 2013-07-02 NOTE — Patient Instructions (Addendum)
Stay as active as tolerated. 

## 2013-07-03 ENCOUNTER — Ambulatory Visit: Payer: Medicare Other | Admitting: Internal Medicine

## 2013-07-10 ENCOUNTER — Encounter: Payer: Self-pay | Admitting: Internal Medicine

## 2013-07-10 ENCOUNTER — Ambulatory Visit (INDEPENDENT_AMBULATORY_CARE_PROVIDER_SITE_OTHER): Payer: Medicare Other | Admitting: Internal Medicine

## 2013-07-10 VITALS — BP 128/78 | HR 96 | Temp 98.2°F | Resp 16 | Ht 63.0 in | Wt 384.8 lb

## 2013-07-10 DIAGNOSIS — Z23 Encounter for immunization: Secondary | ICD-10-CM

## 2013-07-10 DIAGNOSIS — E119 Type 2 diabetes mellitus without complications: Secondary | ICD-10-CM

## 2013-07-10 LAB — BASIC METABOLIC PANEL
CO2: 29 mEq/L (ref 19–32)
Calcium: 9.7 mg/dL (ref 8.4–10.5)
Creatinine, Ser: 1.1 mg/dL (ref 0.4–1.2)
GFR: 51.93 mL/min — ABNORMAL LOW (ref >60.00)
Sodium: 138 mEq/L (ref 135–145)

## 2013-07-10 LAB — HEMOGLOBIN A1C: Hgb A1c MFr Bld: 8.2 % — ABNORMAL HIGH (ref 4.6–6.5)

## 2013-07-10 MED ORDER — BROMOCRIPTINE MESYLATE 0.8 MG PO TABS: ORAL_TABLET | ORAL | Status: DC

## 2013-07-10 NOTE — Patient Instructions (Addendum)
Please return in 1 months with your sugar log.  Start Cycloset 1 tablet daily for 1 week, then 2 tablets daily in am, with breakfast, until you see me next. Check sugars later in the day, too. Please stop at the lab.

## 2013-07-10 NOTE — Progress Notes (Signed)
Patient ID: Theresa Huffman, female   DOB: [birth year], 67 y.o.   MRN: [id number]  HPI: Theresa Huffman is a 67 y.o.-year-old female, returning for f/u for DM2, dx 2010, non-insulin-dependent, uncontrolled, with complications (Peripheral neuropathy, mild CKD). Last visit 1 mo ago.  Last hemoglobin A1c was: Lab Results  Component Value Date   HGBA1C 8.2* 04/18/2013  Prev. HbA1C was 7.1% in 01/2012, prev. 8.5% in 04/2011. She believes her HbA1c increased due to getting back to eating sweats.   Pt is on a regimen of: - Metformin 1000 bid - Invokana 300 mg - Glipizide 5 bid We had to stop Januvia 100 - expensive. She is in the doughnut hole by the end of the year.   Pt checks her sugars once a day and they are: - am: 150-200 (before relaxing her diet: 100-105) >> 158-210 - before lunch: 208 - before dinner: 125-159 - 2h after dinner: 225 - bedtime: 175-205  No lows. Lowest sugar was 125; she has hypoglycemia awareness at 80. Highest sugar was low 200s  She was considering GBP, but is afraid of the sx. She is a stress eater and has a lot of stress at home.  Will start 6 weeks of physical tx - 2x a week.  - has mild CKD, last BUN/creatinine:  Lab Results  Component Value Date   BUN 21 05/27/2013   CREATININE 1.1 05/27/2013  Has h/o angioedema - ACEI stopped - last set of lipids: Lab Results  Component Value Date   CHOL 161 05/27/2013   HDL 44.60 05/27/2013   LDLDIRECT 81.4 05/27/2013   TRIG 258.0* 05/27/2013   CHOLHDL 4 05/27/2013  She is on Zocor. She takes ASA 81. - last eye exam was a year ago >> need to schedule an eye exam. No DR.  - + numbness and tingling in her feet - improved lately  Pt also has a history of HTN, Hypothyroidism - last TSH 2.08 2 mo ago, vit D def., HL, morbid obesity, lymphedema, vv insuff., GERD, lumbago, chronic bilateral knee pain. No h/o UTIs.  I reviewed pt's medications, allergies, PMH, social hx, family hx and no changes required, except as  mentioned above.  ROS: Constitutional: no weight gain/loss, + fatigue, no subjective hyperthermia/hypothermia; + poor sleep Eyes: no blurry vision, no xerophthalmia ENT: no sore throat, no nodules palpated in throat, no dysphagia/odynophagia, no hoarseness Cardiovascular: no CP/+ SOB/palpitations/+ leg swelling Respiratory: no cough/+SOB Gastrointestinal: no N/V/+ D/no C Musculoskeletal: no muscle/joint aches Skin: no rashes; + hair loss Neurological: no tremors/numbness/tingling/dizziness; + HA  PE: BP 128/78  Pulse 96  Temp(Src) 98.2 F (36.8 C) (Oral)  Resp 16  Ht 5\' 3"  (1.6 m)  Wt 384 lb 12 oz (174.521 kg)  BMI 68.17 kg/m2 Wt Readings from Last 3 Encounters:  07/10/13 384 lb 12 oz (174.521 kg)  07/02/13 386 lb 9.6 oz (175.361 kg)  06/05/13 380 lb (172.367 kg)  Per Pain management's scale, she lost 6 lbs in last month.  Constitutional: obesity grade 3, in NAD Eyes: PERRLA, EOMI, no exophthalmos ENT: moist mucous membranes, no thyromegaly, no cervical lymphadenopathy Cardiovascular: tachycardia, RR, No MRG Respiratory: CTA B Gastrointestinal: abdomen soft, NT, ND, BS+ Musculoskeletal: strength intact in all 4 Skin: moist, warm, no rashes Severe lymphedema bilateral legs  ASSESSMENT: 1. DM2, non-insulin-dependent, uncontrolled, with complications - PN, on Neurontin  PLAN:  1. Patient with long-standing, recently more uncontrolled diabetes, on oral antidiabetic regimen, but sugars still high (mostly checking in am) - We  discussed about options for treatment, and I suggested to:  Continue Metformin 1000 mg bid Continue Invokana 300 mg daily - no SEs  Start Cycloset 1 tab a day >> 2 tabs a day in a week - advised that diet is most important to control weight, but exercise helps, too - advised her to start checking her sugars at different times of the day - check 2 times a day, rotating checks - check A1c and BMP today - flu vaccine given today - Return to clinic in  one month with sugar log    Office Visit on 07/10/2013  Component Date Value Range Status  . Sodium 07/10/2013 138  135 - 145 mEq/L Final  . Potassium 07/10/2013 4.2  3.5 - 5.1 mEq/L Final  . Chloride 07/10/2013 99  96 - 112 mEq/L Final  . CO2 07/10/2013 29  19 - 32 mEq/L Final  . Glucose, Bld 07/10/2013 194* 70 - 99 mg/dL Final  . BUN 16/08/9603 19  6 - 23 mg/dL Final  . Creatinine, Ser 07/10/2013 1.1  0.4 - 1.2 mg/dL Final  . Calcium 54/07/8118 9.7  8.4 - 10.5 mg/dL Final  . GFR 14/78/2956 51.93* >60.00 mL/min Final  . Hemoglobin A1C 07/10/2013 8.2* 4.6 - 6.5 % Final   Glycemic Control Guidelines for People with Diabetes:Non Diabetic:  <6%Goal of Therapy: <7%Additional Action Suggested:  >8%    Hba1c stable. No increased K. GFR stable.

## 2013-07-15 ENCOUNTER — Other Ambulatory Visit: Payer: Self-pay | Admitting: *Deleted

## 2013-07-15 MED ORDER — GLIPIZIDE 5 MG PO TABS: 5.0000 mg | ORAL_TABLET | Freq: Two times a day (BID) | ORAL | Status: DC

## 2013-07-15 NOTE — Telephone Encounter (Signed)
Received faxed refill request from pharmacy. Refill sent to pharmacy electronically. 

## 2013-07-19 ENCOUNTER — Telehealth: Payer: Self-pay

## 2013-07-19 ENCOUNTER — Ambulatory Visit (INDEPENDENT_AMBULATORY_CARE_PROVIDER_SITE_OTHER): Payer: Medicare Other | Admitting: Family Medicine

## 2013-07-19 ENCOUNTER — Encounter: Payer: Self-pay | Admitting: Family Medicine

## 2013-07-19 VITALS — BP 120/80 | HR 105 | Temp 98.0°F | Ht 63.0 in | Wt 387.5 lb

## 2013-07-19 DIAGNOSIS — J209 Acute bronchitis, unspecified: Secondary | ICD-10-CM

## 2013-07-19 MED ORDER — AZITHROMYCIN 250 MG PO TABS: ORAL_TABLET | ORAL | Status: DC

## 2013-07-19 NOTE — Telephone Encounter (Signed)
Pt left note that med (pt not sure of name) prescribed by Dr Elvera Lennox was not authorized by insurance. I called [facility] to get name of med and PA request and was told Dr Charlean Sanfilippo office already has PA info for [employer]. Pt request cb at [phone] with update on prior auth status.

## 2013-07-19 NOTE — Patient Instructions (Signed)
Tylenol or ibuprofen for fever. Mucinex DM  Twice daily for cough.  If not turning the corner by day 5-7 of illness start antibiotics that I have given you.

## 2013-07-19 NOTE — Assessment & Plan Note (Signed)
Likely viral in origin. Symptomatic care. Given pt past experience and concern. i have given her rx for antibiotics to fill if not improving as expected.

## 2013-07-19 NOTE — Progress Notes (Signed)
  Subjective:    Patient ID: Theresa Huffman, female    DOB: 08/19/1946, 67 y.o.   MRN: [id number]  Cough This is a new problem. The current episode started in the past 7 days (3 days ago). The problem has been gradually worsening. The problem occurs constantly. The cough is productive of sputum. Associated symptoms include a fever, rhinorrhea, a sore throat and shortness of breath. Pertinent negatives include no chills, ear pain, nasal congestion, postnasal drip, sweats, weight loss or wheezing. Associated symptoms comments: Ear itching. Nothing aggravates the symptoms. Risk factors: nonsmoker. She has tried nothing for the symptoms. There is no history of asthma, bronchiectasis, bronchitis, COPD, emphysema, environmental allergies or pneumonia.      Review of Systems  Constitutional: Positive for fever. Negative for chills and weight loss.  HENT: Positive for sore throat and rhinorrhea. Negative for ear pain and postnasal drip.   Respiratory: Positive for cough and shortness of breath. Negative for wheezing.   Allergic/Immunologic: Negative for environmental allergies.       Objective:   Physical Exam  Constitutional: Vital signs are normal. She appears well-developed and well-nourished. She is cooperative.  Non-toxic appearance. She does not appear ill. No distress.  Morbidly obese  HENT:  Head: Normocephalic.  Right Ear: Hearing, tympanic membrane, external ear and ear canal normal. Tympanic membrane is not erythematous, not retracted and not bulging.  Left Ear: Hearing, tympanic membrane, external ear and ear canal normal. Tympanic membrane is not erythematous, not retracted and not bulging.  Nose: Mucosal edema and rhinorrhea present. Right sinus exhibits no maxillary sinus tenderness and no frontal sinus tenderness. Left sinus exhibits no maxillary sinus tenderness and no frontal sinus tenderness.  Mouth/Throat: Uvula is midline, oropharynx is clear and moist and mucous membranes  are normal.  Eyes: Conjunctivae, EOM and lids are normal. Pupils are equal, round, and reactive to light. Lids are everted and swept, no foreign bodies found.  Neck: Trachea normal and normal range of motion. Neck supple. Carotid bruit is not present. No mass and no thyromegaly present.  Cardiovascular: Normal rate, regular rhythm, S1 normal, S2 normal, normal heart sounds, intact distal pulses and normal pulses.  Exam reveals no gallop and no friction rub.   No murmur heard. Pulmonary/Chest: Effort normal and breath sounds normal. Not tachypneic. No respiratory distress. She has no decreased breath sounds. She has no wheezes. She has no rhonchi. She has no rales.  Neurological: She is alert.  Skin: Skin is warm, dry and intact. No rash noted.  Psychiatric: Her speech is normal and behavior is normal. Judgment normal. Her mood appears not anxious. Cognition and memory are normal. She does not exhibit a depressed mood.          Assessment & Plan:

## 2013-07-22 ENCOUNTER — Telehealth: Payer: Self-pay | Admitting: *Deleted

## 2013-07-22 NOTE — Telephone Encounter (Signed)
Called pt and advised her that Dr Elvera Lennox was on vacation. She just returned this morning and completed the PA. Sent in this morning and hopefully we will get a response today. Pt understood.

## 2013-07-31 ENCOUNTER — Encounter: Payer: Self-pay | Admitting: Physical Medicine and Rehabilitation

## 2013-07-31 ENCOUNTER — Encounter
Payer: Medicare Other | Attending: Physical Medicine and Rehabilitation | Admitting: Physical Medicine and Rehabilitation

## 2013-07-31 VITALS — BP 164/77 | HR 102 | Resp 14 | Ht 63.0 in | Wt 384.4 lb

## 2013-07-31 DIAGNOSIS — G8929 Other chronic pain: Secondary | ICD-10-CM | POA: Insufficient documentation

## 2013-07-31 DIAGNOSIS — M545 Low back pain, unspecified: Secondary | ICD-10-CM | POA: Insufficient documentation

## 2013-07-31 DIAGNOSIS — E1142 Type 2 diabetes mellitus with diabetic polyneuropathy: Secondary | ICD-10-CM | POA: Insufficient documentation

## 2013-07-31 DIAGNOSIS — I10 Essential (primary) hypertension: Secondary | ICD-10-CM | POA: Insufficient documentation

## 2013-07-31 DIAGNOSIS — M549 Dorsalgia, unspecified: Secondary | ICD-10-CM | POA: Insufficient documentation

## 2013-07-31 DIAGNOSIS — M62838 Other muscle spasm: Secondary | ICD-10-CM | POA: Insufficient documentation

## 2013-07-31 DIAGNOSIS — M25569 Pain in unspecified knee: Secondary | ICD-10-CM | POA: Insufficient documentation

## 2013-07-31 DIAGNOSIS — M170 Bilateral primary osteoarthritis of knee: Secondary | ICD-10-CM

## 2013-07-31 DIAGNOSIS — M47817 Spondylosis without myelopathy or radiculopathy, lumbosacral region: Secondary | ICD-10-CM

## 2013-07-31 DIAGNOSIS — E1149 Type 2 diabetes mellitus with other diabetic neurological complication: Secondary | ICD-10-CM | POA: Insufficient documentation

## 2013-07-31 MED ORDER — HYDROCODONE-ACETAMINOPHEN 10-325 MG PO TABS: 1.0000 | ORAL_TABLET | Freq: Three times a day (TID) | ORAL | Status: DC

## 2013-07-31 MED ORDER — MORPHINE SULFATE ER 15 MG PO TBCR: 15.0000 mg | EXTENDED_RELEASE_TABLET | Freq: Three times a day (TID) | ORAL | Status: DC

## 2013-07-31 NOTE — Patient Instructions (Signed)
Continue with your PT 

## 2013-07-31 NOTE — Progress Notes (Signed)
Subjective:    Patient ID: Theresa Huffman, female    DOB: 1946-02-24, 67 y.o.   MRN: [id number]  HPI The patient complains about chronic bilateral knee pain . The patient also complains about low back pain. She states that her right hip pain has improved, therefore she does not want to consider x-rays of her hip yet.  The problem has been stable otherwise.  The patient reports that she went to a seminar about gastric bypass and lab band surgery, and is considering this in the future.  The patient states, that she really wants to be able to walk more, but pain and weight interferes with this.  She reports that she went to the ED, because of an allergic reaction to an ace-inhibitor, she followed up with her PCP, who prescribed another HTN medication. She reports that she has started PT, but they did exercises on land for the first 3 weeks, some of those exercises aggrevated her pain, I ordered aquatic therapy !  Pain Inventory Average Pain 7 Pain Right Now 7 My pain is burning and aching  In the last 24 hours, has pain interfered with the following? General activity 9 Relation with others 9 Enjoyment of life 9 What TIME of day is your pain at its worst? morning, evening Sleep (in general) Fair  Pain is worse with: walking, bending and standing Pain improves with: rest and medication Relief from Meds: 5  Mobility walk without assistance how many minutes can you walk? 10 ability to climb steps?  no do you drive?  yes transfers alone Do you have any goals in this area?  yes  Function disabled: date disabled na Do you have any goals in this area?  no  Neuro/Psych weakness trouble walking depression  Prior Studies Any changes since last visit?  no  Physicians involved in your care Any changes since last visit?  yes physical therapy    Family History  Problem Relation Age of Onset  . Cancer Mother     lung and colon  . Stroke Sister    History   Social  History  . Marital Status: Married    Spouse Name: N/A    Number of Children: 1  . Years of Education: N/A   Occupational History  . Diability    Social History Main Topics  . Smoking status: Never Smoker   . Smokeless tobacco: Never Used  . Alcohol Use: No  . Drug Use: No  . Sexual Activity: None   Other Topics Concern  . None   Social History Narrative   Lives in [place], one adult child      Caffeine use: coffee daily in am   Past Surgical History  Procedure Laterality Date  . Cholecystectomy    . Tubal ligation     Past Medical History  Diagnosis Date  . Hyperlipidemia   . Hypertension   . Low back pain   . Morbid obesity   . Diabetes mellitus     type II  . Thyroid disease     hypothyroidism  . GERD (gastroesophageal reflux disease)   . Lymphedema   . Lumbosacral spondylosis without myelopathy   . Pain in limb   . Lumbago   . Facet syndrome, lumbar   . Lymphedema   . Primary localized osteoarthrosis, lower leg   . Hypercholesteremia    BP 164/77  Pulse 102  Resp 14  Ht 5\' 3"  (1.6 m)  Wt 384 lb 6.4 oz (174.363 kg)  BMI 68.11 kg/m2  SpO2 89%    Review of Systems  Musculoskeletal: Positive for gait problem.  Neurological: Positive for weakness.  Psychiatric/Behavioral: Positive for dysphoric mood.  All other systems reviewed and are negative.       Objective:   Physical Exam Constitutional: She is oriented to person, place, and time. She appears well-developed and well-nourished.  Morbidly obese, walks with crutches  HENT:  Head: Normocephalic.  Neck: Neck supple.  Musculoskeletal: She exhibits tenderness.  Neurological: She is alert and oriented to person, place, and time.  Skin: Skin is warm and dry.  Psychiatric: She has a normal mood and affect.  Symmetric normal motor tone is noted throughout. Normal muscle bulk. Muscle testing reveals 5/5 muscle strength of the upper extremity, and 5/5 of the lower extremity, except iliopsoas  on the right 4-/5. Full range of motion in upper and lower extremities. ROM of spine is restricted. Difficulties to elicit DTR, because of body habitus. No clonus is noted.  Patient arises from chair with difficulty. Wide based gait with 2 crutches.        Assessment & Plan:  This is a 67 year old female with  1.Bilateral chronic knee pain, prescribed Voltaren gel, patient wanted to get off the ibuprofen, because she will not be able to take that much ibuprofen after she has a bariatric surgery.She stated today that the Voltaren gel does not give her much relief, and she is taking the Ibuprofen again.  2.Low back pain  3.Morbidly obese  4.Peripheral neuropathy, s/p DM  5. Muscle spasms/pain in her trapezius, prescribed Robaxin 500mg  tid, but her insurance is not paying for this, prescribed Tizanidine 4mg  , bid, which did not help that much. The patient wants to get back on the Robaxin, she states, that the Tizanidine is not as effective, I prescribed her the robaxin again at the last visit.  6. The patient reports that she saw her PCP after she went to the ED, for an allergic reaction to an ace-inhibitor, she is now on another HTN med.  Plan :  Continue with medication. Advised patient to do some light exercising in a sitting or lying position. The patient states that she is not considering a gastric bypass or a lab band Surgery,at this point she states that she has been to a seminar for that . She is interested in seeing a psychologist for her food addiction. She will try to find out whether her insurance would pay for this. Then I will refer her .  Ordered aquatic therapy at her last visit , which would be very beneficial for her low back and her arthritic knees, it would also help her to loose weight. She stated that the first 3 weeks they did PT on land with her, she stated that some of those exercises aggrevated her pain. Tomorrow she will finally start in the water. Refilled her MS Contin and  hydrocodone today.  Discussed lifestyle changes , diet, especially salt decrease, and exercising again .  Follow up in 1 month.

## 2013-08-07 ENCOUNTER — Telehealth: Payer: Self-pay

## 2013-08-07 NOTE — Telephone Encounter (Signed)
Danielle Laygo(Physical Therapist) @ [facility] would like for you to contact her. She needs to clarify some things that were in the patient's notes. Her contact number is [phone].

## 2013-08-07 NOTE — Telephone Encounter (Signed)
Talked to her, it was concerning some of the exercises the patient did, which were not that beneficial for her arthritis, therefore I recommended to do mainly aquatic PT, as I prescribed in the first place

## 2013-08-13 ENCOUNTER — Other Ambulatory Visit: Payer: Self-pay

## 2013-08-13 MED ORDER — METHOCARBAMOL 500 MG PO TABS: 500.0000 mg | ORAL_TABLET | Freq: Three times a day (TID) | ORAL | Status: DC

## 2013-08-19 ENCOUNTER — Ambulatory Visit (INDEPENDENT_AMBULATORY_CARE_PROVIDER_SITE_OTHER): Payer: Medicare Other | Admitting: Family Medicine

## 2013-08-19 ENCOUNTER — Encounter: Payer: Self-pay | Admitting: Family Medicine

## 2013-08-19 VITALS — BP 158/88 | HR 100 | Temp 98.1°F | Wt 388.5 lb

## 2013-08-19 DIAGNOSIS — R0602 Shortness of breath: Secondary | ICD-10-CM

## 2013-08-19 NOTE — Progress Notes (Signed)
67 y/o female with mult medical problems.   "I get SOB a lot of times, like getting up and walking 30-35 feet."  1 week ago was at water aerobics walking in the pool (chest high water level) and got nauseated and SOB.  Had to get out of the pool, and felt better.  Happened again a few days later.  When she holds her grandson on her chest, she'll get SOB, similar to being in the pool. No fevers, chills, vomiting. Swelling at baseline. No cough, no sputum. No chest pain. Using the inhaler doesn't seem to help much with SOB.  She has rare wheeze.    Meds, vitals, and allergies reviewed.   ROS: See HPI.  Otherwise, noncontributory.  nad Morbidly obese Nasal exam wnl OP wnl Neck supple, no LA rrr ctab Exam limited by habitus but no dec in BS noted.  BLE diffusely enlarged but equally and not apparently acutely

## 2013-08-19 NOTE — Assessment & Plan Note (Signed)
She has reproducible sx in the clinic with external pressure applied with my hands anterior/posterior on the chest during the exam- the sensation resolves after compression is removed.  It appears that the weight of the grandchild and the weight of displaced water in combination of the weight of her chest wall with likely chronic deconditioning of the respiratory muscles is causing her sx.  She doesn't appear fluid overloaded and has no chest pain.  I talked with patient about strengthening her chest wall muscles with breathing against pursed lips in the meantime.   I'll ask for pulm input.  She needs to lose weight.  To PCP as FYI.

## 2013-08-19 NOTE — Patient Instructions (Signed)
Stay out of the pool for now.  Work on deep breathing exercises in the meantime.  Be careful not to get lightheaded.  Take care.

## 2013-08-21 ENCOUNTER — Telehealth: Payer: Self-pay

## 2013-08-21 ENCOUNTER — Ambulatory Visit: Payer: Medicare Other | Admitting: Internal Medicine

## 2013-08-21 NOTE — Telephone Encounter (Signed)
We will need to test her stool for something called Cdiff. This cannot be done without and OV

## 2013-08-21 NOTE — Telephone Encounter (Signed)
Agreed -

## 2013-08-21 NOTE — Telephone Encounter (Signed)
Patient notified as instructed by telephone. Pt said diarrhea is slightly better but cannot leave restroom today and pt scheduled appt with Dr Para March 08/22/13 at 2 pm(1st appt pt could come to office). Advised pt if condition changes or worsens to call office or if at night to go to UC. Pt voiced understanding.

## 2013-08-21 NOTE — Telephone Encounter (Signed)
Diarrhea started last night; pt has taken 6 Immodium AD since last night at 10 pm. Pt said can not come to office for appt due to staying in restroom with diarrhea.  Since 08/20/13 at 10 pm has had 10 loose watery stools; no blood seen. No abd pain,no Nausea or vomiting and no fever. [place].

## 2013-08-22 ENCOUNTER — Encounter: Payer: Self-pay | Admitting: Family Medicine

## 2013-08-22 ENCOUNTER — Ambulatory Visit (INDEPENDENT_AMBULATORY_CARE_PROVIDER_SITE_OTHER): Payer: Medicare Other | Admitting: Family Medicine

## 2013-08-22 VITALS — BP 148/80 | HR 104 | Temp 97.5°F

## 2013-08-22 DIAGNOSIS — R197 Diarrhea, unspecified: Secondary | ICD-10-CM

## 2013-08-22 NOTE — Assessment & Plan Note (Signed)
Unclear source, AGE present in community but no vomiting for patient. Doesn't appear dehydrated.  D/w pt about BP meds.  See instructions.  Check stool studies today.  dw pt about hand washing. Okay for outpatient f/u.

## 2013-08-22 NOTE — Patient Instructions (Signed)
We'll contact you with your lab report. If you get lightheaded then skip a dose of amlodipine and univasc.  Drink plenty of fluids but avoid dairy.  Take care.

## 2013-08-22 NOTE — Progress Notes (Signed)
Started with diarrhea Tuesday night. Runny and yellow. Up to 15-20 times per day. No abd pain. No fevers.  No vomiting.  Doesn't see mucous in the stool.  Urgency. No travel. No atypical foods. No other sick contacts. On abx a few weeks ago, zmax.  No nausea.  BMs tend to be small.  Still with UOP, not dark. Still taking fluids well.  Not lightheaded.  No blood in stool.  Meds, vitals, and allergies reviewed.   ROS: See HPI.  Otherwise, noncontributory.  nad ncat Mmm rrr ctab abd soft, normal BS, not ttp Ext puffy but appear at baseline Exam limited by habitus

## 2013-08-27 LAB — STOOL CULTURE

## 2013-08-28 ENCOUNTER — Encounter: Payer: Self-pay | Admitting: Internal Medicine

## 2013-08-28 ENCOUNTER — Ambulatory Visit (INDEPENDENT_AMBULATORY_CARE_PROVIDER_SITE_OTHER): Payer: Medicare Other | Admitting: Internal Medicine

## 2013-08-28 VITALS — BP 124/76 | HR 117 | Temp 97.7°F | Resp 16 | Wt 361.0 lb

## 2013-08-28 DIAGNOSIS — E119 Type 2 diabetes mellitus without complications: Secondary | ICD-10-CM

## 2013-08-28 MED ORDER — BROMOCRIPTINE MESYLATE 0.8 MG PO TABS: ORAL_TABLET | ORAL | Status: DC

## 2013-08-28 NOTE — Patient Instructions (Signed)
Please return in 3 months with your sugar log.  Keep on the same diabetes regimen. Try to work on the late night snaks.  Try to replace snacking on these with drinking/eating: * soy or almond milk * veggies with humus or other low calorie/low fat dip * low glycemic index fruits (higher glycemic index = higher risk to increase your sugars):          http://www.health.https://www.brown.info/ * fruit/veggie smoothies          Ninja blender recipes:          [email or web address] * unsalted nuts Etc.  Please also try the website: www.choosemyplate.gov

## 2013-08-28 NOTE — Progress Notes (Signed)
Patient ID: Theresa Huffman, female   DOB: [birth year], 67 y.o.   MRN: [id number]  HPI: Theresa Huffman is a 67 y.o.-year-old female, returning for f/u for DM2, dx 2010, non-insulin-dependent, uncontrolled, with complications (Peripheral neuropathy, mild CKD). Last visit 1.5 mo ago.  Last hemoglobin A1c was: Lab Results  Component Value Date   HGBA1C 8.2* 07/10/2013   HGBA1C 8.2* 04/18/2013   HGBA1C 7.1* 02/13/2012  Prev. HbA1C was 8.5% in 04/2011. She believes her HbA1c increased due to getting back to eating sweats.   Pt is on a regimen of: - Metformin 1000 bid - Invokana 300 mg - Glipizide 5 bid - started Cycloset at last visit - 1.6 mg now - takes it 2 hours after she wakes up We had to stop Januvia 100 - expensive. She is in the doughnut hole by the end of the year.   Pt checks her sugars once a day and they are improved, especially later in the day: - am: 150-200 (before relaxing her diet: 100-105) >> 158-210 >> 130-235, lately 127-164 - before lunch: 208 >> 186 - before dinner: 125-159 >> 114-163 - 2h after dinner: 225 >> 139-189 - bedtime: 175-205 >> 142-189 No lows. Lowest sugar was 114; she has hypoglycemia awareness at 80. Highest sugar was low 200s  She was considering GBP, but is afraid of the sx. She is a stress eater and has a lot of stress at home.  - has mild CKD, last BUN/creatinine:  Lab Results  Component Value Date   BUN 19 07/10/2013   CREATININE 1.1 07/10/2013  Has h/o angioedema - ACEI stopped - last set of lipids: Lab Results  Component Value Date   CHOL 161 05/27/2013   HDL 44.60 05/27/2013   LDLDIRECT 81.4 05/27/2013   TRIG 258.0* 05/27/2013   CHOLHDL 4 05/27/2013  She is on Zocor. She takes ASA 81. - last eye exam was a year ago >> need to schedule an eye exam. No DR.  - + numbness and tingling in her feet - improved lately  Pt also has a history of HTN, Hypothyroidism - last TSH 2.08 2 mo ago, vit D def., HL, morbid obesity, lymphedema, vv  insuff., GERD, lumbago, chronic bilateral knee pain. No h/o UTIs.  I reviewed pt's medications, allergies, PMH, social hx, family hx and no changes required, except as mentioned above.  ROS: Constitutional: no weight gain/loss, + fatigue, no subjective hyperthermia/hypothermia; Eyes: no blurry vision, no xerophthalmia ENT: no sore throat, no nodules palpated in throat, no dysphagia/odynophagia, no hoarseness Cardiovascular: no CP/SOB/palpitations/+ leg swelling Respiratory: no cough/SOB Gastrointestinal: no N/V/+ D/no C Musculoskeletal: no muscle/joint aches Skin: no rashes  PE: BP 124/76  Pulse 117  Temp(Src) 97.7 F (36.5 C) (Oral)  Resp 16  Wt 361 lb (163.749 kg)  BMI 63.96 kg/m2  SpO2 95% Wt Readings from Last 3 Encounters:  08/28/13 361 lb (163.749 kg)  08/19/13 388 lb 8 oz (176.222 kg)  07/31/13 384 lb 6.4 oz (174.363 kg)  Per Pain management's scale, she lost 6 lbs in last month.  Constitutional: obesity grade 3, in NAD Eyes: PERRLA, EOMI, no exophthalmos ENT: moist mucous membranes, no thyromegaly, no cervical lymphadenopathy Cardiovascular: tachycardia, RR, No MRG Respiratory: CTA B Gastrointestinal: abdomen soft, NT, ND, BS+ Musculoskeletal: strength intact in all 4 Skin: moist, warm, no rashes Severe lymphedema bilateral legs  ASSESSMENT: 1. DM2, non-insulin-dependent, uncontrolled, with complications - PN, on Neurontin  PLAN:  1. Patient with long-standing, recently better controlled diabetes after  starting Cycloset - We discussed about options for treatment, and I suggested to:  Continue Metformin 1000 mg bid Continue Invokana 300 mg daily - no SEs  Continue Cycloset 2 tabs a day  - refilled Cycloset - advised to take Cycloset upon awakening, rather than 2h later - Given healthy snack examples. Sugars not quite at goal but they can be improved with improved snacking. She agrees and would try.  - advised her to continue checking her sugars at different  times of the day - check 2 times a day, rotating checks - given more sugar logs - check HbA1c at next visit - flu vaccine given today - Return to clinic in 3 months with sugar log

## 2013-09-02 ENCOUNTER — Encounter: Payer: Self-pay | Admitting: Physical Medicine and Rehabilitation

## 2013-09-02 ENCOUNTER — Encounter
Payer: Medicare Other | Attending: Physical Medicine and Rehabilitation | Admitting: Physical Medicine and Rehabilitation

## 2013-09-02 VITALS — BP 169/85 | HR 93 | Resp 16 | Ht 63.0 in | Wt 366.0 lb

## 2013-09-02 DIAGNOSIS — M545 Low back pain, unspecified: Secondary | ICD-10-CM | POA: Insufficient documentation

## 2013-09-02 DIAGNOSIS — M5136 Other intervertebral disc degeneration, lumbar region: Secondary | ICD-10-CM

## 2013-09-02 DIAGNOSIS — E1142 Type 2 diabetes mellitus with diabetic polyneuropathy: Secondary | ICD-10-CM | POA: Insufficient documentation

## 2013-09-02 DIAGNOSIS — M5137 Other intervertebral disc degeneration, lumbosacral region: Secondary | ICD-10-CM

## 2013-09-02 DIAGNOSIS — M5380 Other specified dorsopathies, site unspecified: Secondary | ICD-10-CM | POA: Insufficient documentation

## 2013-09-02 DIAGNOSIS — M25561 Pain in right knee: Secondary | ICD-10-CM

## 2013-09-02 DIAGNOSIS — E1149 Type 2 diabetes mellitus with other diabetic neurological complication: Secondary | ICD-10-CM | POA: Insufficient documentation

## 2013-09-02 DIAGNOSIS — G8929 Other chronic pain: Secondary | ICD-10-CM | POA: Insufficient documentation

## 2013-09-02 DIAGNOSIS — I10 Essential (primary) hypertension: Secondary | ICD-10-CM | POA: Insufficient documentation

## 2013-09-02 DIAGNOSIS — M25569 Pain in unspecified knee: Secondary | ICD-10-CM | POA: Insufficient documentation

## 2013-09-02 MED ORDER — MORPHINE SULFATE ER 15 MG PO TBCR: 15.0000 mg | EXTENDED_RELEASE_TABLET | Freq: Three times a day (TID) | ORAL | Status: DC

## 2013-09-02 MED ORDER — HYDROCODONE-ACETAMINOPHEN 10-325 MG PO TABS: 1.0000 | ORAL_TABLET | Freq: Three times a day (TID) | ORAL | Status: DC

## 2013-09-02 NOTE — Patient Instructions (Signed)
Try to stay as active as tolerated, continue with the exercises you learned from PT

## 2013-09-02 NOTE — Progress Notes (Signed)
Subjective:    Patient ID: Theresa Huffman, female    DOB: [birth year], 67 y.o.   MRN: [id number]  HPI The patient complains about chronic bilateral knee pain . The patient also complains about low back pain. She states that her right hip pain has improved, therefore she does not want to consider x-rays of her hip yet.  The problem has been stable otherwise.  The patient reports that she went to a seminar about gastric bypass and lab band surgery, and is considering this in the future.  The patient states, that she really wants to be able to walk more, but pain and weight interferes with this.  She reports that she went to the ED, because of an allergic reaction to an ace-inhibitor, she followed up with her PCP, who prescribed another HTN medication.  She reports that she did aquatic therapy, but only twice, because she felt sick in the water, and felt pressure on her chest.This has completely resolved. She has stopped the aquatic therapy. She has followed up for this with her PCP, who told her to quit the aquatic therapy. She has lost about 18 lbs since her last visit.  Pain Inventory Average Pain 8 Pain Right Now 8 My pain is burning and aching  In the last 24 hours, has pain interfered with the following? General activity 9 Relation with others 7 Enjoyment of life 9 What TIME of day is your pain at its worst? varies Sleep (in general) Fair  Pain is worse with: walking, bending and unsure Pain improves with: rest, medication and TENS Relief from Meds: 6  Mobility walk with assistance how many minutes can you walk? 10 ability to climb steps?  no do you drive?  yes use a wheelchair Do you have any goals in this area?  yes  Function disabled: date disabled . Do you have any goals in this area?  no  Neuro/Psych weakness trouble walking depression  Prior Studies Any changes since last visit?  no  Physicians involved in your care Any changes since last visit?   no   Family History  Problem Relation Age of Onset  . Cancer Mother     lung and colon  . Stroke Sister    History   Social History  . Marital Status: Married    Spouse Name: N/A    Number of Children: 1  . Years of Education: N/A   Occupational History  . Diability    Social History Main Topics  . Smoking status: Never Smoker   . Smokeless tobacco: Never Used  . Alcohol Use: No  . Drug Use: No  . Sexual Activity: None   Other Topics Concern  . None   Social History Narrative   Lives in [place], one adult child      Caffeine use: coffee daily in am   Past Surgical History  Procedure Laterality Date  . Cholecystectomy    . Tubal ligation     Past Medical History  Diagnosis Date  . Hyperlipidemia   . Hypertension   . Low back pain   . Morbid obesity   . Diabetes mellitus     type II  . Thyroid disease     hypothyroidism  . GERD (gastroesophageal reflux disease)   . Lymphedema   . Lumbosacral spondylosis without myelopathy   . Pain in limb   . Lumbago   . Facet syndrome, lumbar   . Lymphedema   . Primary localized osteoarthrosis, lower leg   .  Hypercholesteremia    BP 169/85  Pulse 93  Resp 16  Ht 5\' 3"  (1.6 m)  Wt 366 lb (166.017 kg)  BMI 64.85 kg/m2  SpO2 93%     Review of Systems  Musculoskeletal: Positive for back pain and gait problem.  Neurological: Positive for weakness.  Psychiatric/Behavioral: Positive for dysphoric mood.  All other systems reviewed and are negative.       Objective:   Physical Exam Constitutional: She is oriented to person, place, and time. She appears well-developed and well-nourished.  Morbidly obese, walks with crutches  HENT:  Head: Normocephalic.  Neck: Neck supple.  Musculoskeletal: She exhibits tenderness.  Neurological: She is alert and oriented to person, place, and time.  Skin: Skin is warm and dry.  Psychiatric: She has a normal mood and affect.  Symmetric normal motor tone is noted  throughout. Normal muscle bulk. Muscle testing reveals 5/5 muscle strength of the upper extremity, and 5/5 of the lower extremity, except iliopsoas on the right 4-/5. Full range of motion in upper and lower extremities. ROM of spine is restricted. Difficulties to elicit DTR, because of body habitus. No clonus is noted.  Patient arises from chair with difficulty. Wide based gait with 2 crutches.        Assessment & Plan:  This is a 67 year old female with  1.Bilateral chronic knee pain, prescribed Voltaren gel, patient wanted to get off the ibuprofen, because she will not be able to take that much ibuprofen after she has a bariatric surgery.She stated today that the Voltaren gel does not give her much relief, and she is taking the Ibuprofen again.  2.Low back pain  3.Morbidly obese  4.Peripheral neuropathy, s/p DM  5. Muscle spasms/pain in her trapezius, prescribed Robaxin 500mg  tid, but her insurance is not paying for this, prescribed Tizanidine 4mg  , bid, which did not help that much. The patient wants to get back on the Robaxin, she states, that the Tizanidine is not as effective, I prescribed her the robaxin again at the last visit.  6. The patient reports that she saw her PCP after she went to the ED, for an allergic reaction to an ace-inhibitor, she is now on another HTN med.  Plan :  Continue with medication. Advised patient to do some light exercising in a sitting or lying position. The patient states that she is not considering a gastric bypass or a lab band Surgery,at this point she states that she has been to a seminar for that . She is interested in seeing a psychologist for her food addiction. She will try to find out whether her insurance would pay for this. Then I will refer her .  Ordered aquatic therapy at her last visit , which would be very beneficial for her low back and her arthritic knees, it would also help her to loose weight. She stated that she did the aquatic therapy 2  times, but she got sick in the water, and felt pressure on her chest, she followed up with her PCP, who told her that she should discontinue with the water therapy.  Refilled her MS Contin 15mg  tid, and hydrocodone 10mg  tid today.  Discussed lifestyle changes , diet, especially salt decrease, and exercising again .The patient has lost about 18 lbs since the last visit.  Follow up in 1 month.

## 2013-09-24 ENCOUNTER — Other Ambulatory Visit: Payer: Self-pay | Admitting: *Deleted

## 2013-09-24 MED ORDER — METFORMIN HCL 1000 MG PO TABS: ORAL_TABLET | ORAL | Status: DC

## 2013-09-30 ENCOUNTER — Telehealth: Payer: Self-pay

## 2013-09-30 NOTE — Telephone Encounter (Signed)
Ok to refill 

## 2013-09-30 NOTE — Telephone Encounter (Signed)
Refill request for ibuprofen 800mg  tid from [facility].  Please advise.

## 2013-10-01 ENCOUNTER — Encounter
Payer: Medicare Other | Attending: Physical Medicine and Rehabilitation | Admitting: Physical Medicine and Rehabilitation

## 2013-10-01 ENCOUNTER — Other Ambulatory Visit: Payer: Self-pay

## 2013-10-01 ENCOUNTER — Encounter: Payer: Self-pay | Admitting: Physical Medicine and Rehabilitation

## 2013-10-01 VITALS — BP 150/63 | HR 86 | Resp 16 | Ht 63.0 in | Wt 376.0 lb

## 2013-10-01 DIAGNOSIS — Z888 Allergy status to other drugs, medicaments and biological substances status: Secondary | ICD-10-CM | POA: Insufficient documentation

## 2013-10-01 DIAGNOSIS — G8929 Other chronic pain: Secondary | ICD-10-CM | POA: Insufficient documentation

## 2013-10-01 DIAGNOSIS — I10 Essential (primary) hypertension: Secondary | ICD-10-CM | POA: Insufficient documentation

## 2013-10-01 DIAGNOSIS — M545 Low back pain, unspecified: Secondary | ICD-10-CM | POA: Insufficient documentation

## 2013-10-01 DIAGNOSIS — E1149 Type 2 diabetes mellitus with other diabetic neurological complication: Secondary | ICD-10-CM | POA: Insufficient documentation

## 2013-10-01 DIAGNOSIS — E1142 Type 2 diabetes mellitus with diabetic polyneuropathy: Secondary | ICD-10-CM | POA: Insufficient documentation

## 2013-10-01 DIAGNOSIS — M25569 Pain in unspecified knee: Secondary | ICD-10-CM | POA: Insufficient documentation

## 2013-10-01 DIAGNOSIS — M62838 Other muscle spasm: Secondary | ICD-10-CM | POA: Insufficient documentation

## 2013-10-01 MED ORDER — MORPHINE SULFATE ER 15 MG PO TBCR: 15.0000 mg | EXTENDED_RELEASE_TABLET | Freq: Three times a day (TID) | ORAL | Status: DC

## 2013-10-01 MED ORDER — HYDROCODONE-ACETAMINOPHEN 10-325 MG PO TABS: 1.0000 | ORAL_TABLET | Freq: Three times a day (TID) | ORAL | Status: DC

## 2013-10-01 MED ORDER — IBUPROFEN 800 MG PO TABS: 800.0000 mg | ORAL_TABLET | Freq: Three times a day (TID) | ORAL | Status: DC

## 2013-10-01 NOTE — Patient Instructions (Signed)
Try to be as active as tolerated. 

## 2013-10-01 NOTE — Progress Notes (Signed)
Subjective:    Patient ID: Theresa Huffman, female    DOB: Nov 06, 1945, 67 y.o.   MRN: [id number]  HPI The patient complains about chronic bilateral knee pain . The patient also complains about low back pain. She states that her right hip pain has improved, therefore she does not want to consider x-rays of her hip yet.  The problem has been stable otherwise.  The patient reports that she went to a seminar about gastric bypass and lab band surgery, and is considering this in the future.  The patient states, that she really wants to be able to walk more, but pain and weight interferes with this.  She reports that she went to the ED, because of an allergic reaction to an ace-inhibitor, she followed up with her PCP, who prescribed another HTN medication.  She reports that she did aquatic therapy, but only twice, because she felt sick in the water, and felt pressure on her chest.This has completely resolved. She has stopped the aquatic therapy. She has followed up for this with her PCP, who told her to quit the aquatic therapy.  She has lost about 18 lbs since her last visit.  Pain Inventory Average Pain 8 Pain Right Now 8 My pain is burning and aching  In the last 24 hours, has pain interfered with the following? General activity 9 Relation with others 7 Enjoyment of life 7 What TIME of day is your pain at its worst? morning and evening Sleep (in general) Poor  Pain is worse with: walking, bending and standing Pain improves with: rest and medication Relief from Meds: 6  Mobility walk with assistance use a cane how many minutes can you walk? 10 ability to climb steps?  no  Function disabled: date disabled .  Neuro/Psych weakness trouble walking depression  Prior Studies Any changes since last visit?  no  Physicians involved in your care Any changes since last visit?  no   Family History  Problem Relation Age of Onset  . Cancer Mother     lung and colon  . Stroke  Sister    History   Social History  . Marital Status: Married    Spouse Name: N/A    Number of Children: 1  . Years of Education: N/A   Occupational History  . Diability    Social History Main Topics  . Smoking status: Never Smoker   . Smokeless tobacco: Never Used  . Alcohol Use: No  . Drug Use: No  . Sexual Activity: None   Other Topics Concern  . None   Social History Narrative   Lives in [place], one adult child      Caffeine use: coffee daily in am   Past Surgical History  Procedure Laterality Date  . Cholecystectomy    . Tubal ligation     Past Medical History  Diagnosis Date  . Hyperlipidemia   . Hypertension   . Low back pain   . Morbid obesity   . Diabetes mellitus     type II  . Thyroid disease     hypothyroidism  . GERD (gastroesophageal reflux disease)   . Lymphedema   . Lumbosacral spondylosis without myelopathy   . Pain in limb   . Lumbago   . Facet syndrome, lumbar   . Lymphedema   . Primary localized osteoarthrosis, lower leg   . Hypercholesteremia    BP 150/63  Pulse 86  Resp 16  Ht 5\' 3"  (1.6 m)  Wt  376 lb (170.552 kg)  BMI 66.62 kg/m2  SpO2 93%    Review of Systems  Musculoskeletal: Positive for gait problem.  Neurological: Positive for weakness.  Psychiatric/Behavioral: Positive for dysphoric mood.  All other systems reviewed and are negative.       Objective:   Physical Exam Constitutional: She is oriented to person, place, and time. She appears well-developed and well-nourished.  Morbidly obese, walks with crutches  HENT:  Head: Normocephalic.  Neck: Neck supple.  Musculoskeletal: She exhibits tenderness.  Neurological: She is alert and oriented to person, place, and time.  Skin: Skin is warm and dry.  Psychiatric: She has a normal mood and affect.  Symmetric normal motor tone is noted throughout. Normal muscle bulk. Muscle testing reveals 5/5 muscle strength of the upper extremity, and 5/5 of the lower  extremity, except iliopsoas on the right 4-/5. Full range of motion in upper and lower extremities. ROM of spine is restricted. Difficulties to elicit DTR, because of body habitus. No clonus is noted.  Patient arises from chair with difficulty. Wide based gait with 2 crutches.        Assessment & Plan:  This is a 67 year old female with  1.Bilateral chronic knee pain, prescribed Voltaren gel, patient wanted to get off the ibuprofen, because she will not be able to take that much ibuprofen after she has a bariatric surgery.She stated today that the Voltaren gel does not give her much relief, and she is taking the Ibuprofen again.  2.Low back pain  3.Morbidly obese  4.Peripheral neuropathy, s/p DM  5. Muscle spasms/pain in her trapezius, prescribed Robaxin 500mg  tid, but her insurance is not paying for this, prescribed Tizanidine 4mg  , bid, which did not help that much. The patient wants to get back on the Robaxin, she states, that the Tizanidine is not as effective, I prescribed her the robaxin again at the last visit.  6. The patient reports that she saw her PCP after she went to the ED, for an allergic reaction to an ace-inhibitor, she is now on another HTN med.  Plan :  Continue with medication. Advised patient to do some light exercising in a sitting or lying position. The patient states that she is not considering a gastric bypass or a lab band Surgery,at this point she states that she has been to a seminar for that . She is interested in seeing a psychologist for her food addiction. She will try to find out whether her insurance would pay for this. Then I will refer her .  Ordered aquatic therapy at her last visit , which would be very beneficial for her low back and her arthritic knees, it would also help her to loose weight. She stated that she did the aquatic therapy 2 times, but she got sick in the water, and felt pressure on her chest, she followed up with her PCP, who told her that she  should discontinue with the water therapy.  Refilled her MS Contin 15mg  tid, and hydrocodone 10mg  tid today.  Discussed lifestyle changes , diet, especially salt decrease, and exercising again .The patient has lost about 18 lbs since the last visit.  Follow up in 1 month.

## 2013-10-01 NOTE — Telephone Encounter (Signed)
Ibuprofen refill sent to [facility]. Patient is aware.

## 2013-10-02 NOTE — Telephone Encounter (Signed)
Theresa Huffman refilled ibuprofen.

## 2013-10-13 ENCOUNTER — Other Ambulatory Visit: Payer: Self-pay | Admitting: Family Medicine

## 2013-10-21 ENCOUNTER — Other Ambulatory Visit: Payer: Self-pay | Admitting: *Deleted

## 2013-10-21 MED ORDER — MORPHINE SULFATE ER 15 MG PO TBCR: 15.0000 mg | EXTENDED_RELEASE_TABLET | Freq: Three times a day (TID) | ORAL | Status: DC

## 2013-10-21 MED ORDER — HYDROCODONE-ACETAMINOPHEN 10-325 MG PO TABS: 1.0000 | ORAL_TABLET | Freq: Three times a day (TID) | ORAL | Status: DC

## 2013-10-21 NOTE — Telephone Encounter (Signed)
RX printed early for controlled medication for the visit with RN on 11/01/13 (to be signed by MD) 

## 2013-11-01 ENCOUNTER — Encounter: Payer: Self-pay | Admitting: *Deleted

## 2013-11-01 ENCOUNTER — Encounter: Payer: Medicare Other | Attending: Physical Medicine & Rehabilitation | Admitting: *Deleted

## 2013-11-01 VITALS — BP 155/73 | HR 83 | Resp 14

## 2013-11-01 DIAGNOSIS — Z79899 Other long term (current) drug therapy: Secondary | ICD-10-CM | POA: Diagnosis not present

## 2013-11-01 DIAGNOSIS — E1149 Type 2 diabetes mellitus with other diabetic neurological complication: Secondary | ICD-10-CM | POA: Insufficient documentation

## 2013-11-01 DIAGNOSIS — E1142 Type 2 diabetes mellitus with diabetic polyneuropathy: Secondary | ICD-10-CM | POA: Diagnosis not present

## 2013-11-01 DIAGNOSIS — M545 Low back pain, unspecified: Secondary | ICD-10-CM | POA: Diagnosis not present

## 2013-11-01 DIAGNOSIS — I10 Essential (primary) hypertension: Secondary | ICD-10-CM | POA: Diagnosis not present

## 2013-11-01 DIAGNOSIS — M25561 Pain in right knee: Secondary | ICD-10-CM

## 2013-11-01 DIAGNOSIS — M25562 Pain in left knee: Secondary | ICD-10-CM

## 2013-11-01 DIAGNOSIS — M62838 Other muscle spasm: Secondary | ICD-10-CM | POA: Diagnosis not present

## 2013-11-01 DIAGNOSIS — G8929 Other chronic pain: Secondary | ICD-10-CM

## 2013-11-01 DIAGNOSIS — M25569 Pain in unspecified knee: Secondary | ICD-10-CM | POA: Insufficient documentation

## 2013-11-01 DIAGNOSIS — M25551 Pain in right hip: Secondary | ICD-10-CM

## 2013-11-01 NOTE — Patient Instructions (Signed)
Follow up one month with RN for pill count and med refill and 2 month with MD or PA 

## 2013-11-01 NOTE — Progress Notes (Signed)
Here for pill count and medication refills. MS Contin 15 mg # 90 (1 tid) Fill date 10/01/13     Today NV# 2   Hydrocodone 10-325 # 90 (1 tid) Fill date12/2/14    Today NV# 2 Pill counts are approproiate.   VSS   Pain level : 8  No change in pain levels or medication list

## 2013-11-10 DIAGNOSIS — R0602 Shortness of breath: Secondary | ICD-10-CM | POA: Diagnosis not present

## 2013-11-10 DIAGNOSIS — J029 Acute pharyngitis, unspecified: Secondary | ICD-10-CM | POA: Diagnosis not present

## 2013-11-12 ENCOUNTER — Encounter: Payer: Self-pay | Admitting: Internal Medicine

## 2013-11-12 ENCOUNTER — Ambulatory Visit (INDEPENDENT_AMBULATORY_CARE_PROVIDER_SITE_OTHER): Payer: Medicare Other | Admitting: Internal Medicine

## 2013-11-12 VITALS — BP 156/88 | HR 121 | Temp 98.4°F | Wt 376.5 lb

## 2013-11-12 DIAGNOSIS — J209 Acute bronchitis, unspecified: Secondary | ICD-10-CM | POA: Diagnosis not present

## 2013-11-12 MED ORDER — ALBUTEROL SULFATE HFA 108 (90 BASE) MCG/ACT IN AERS: 2.0000 | INHALATION_SPRAY | RESPIRATORY_TRACT | Status: DC | PRN

## 2013-11-12 MED ORDER — BENZONATATE 200 MG PO CAPS: 200.0000 mg | ORAL_CAPSULE | Freq: Three times a day (TID) | ORAL | Status: DC | PRN

## 2013-11-12 MED ORDER — AZITHROMYCIN 250 MG PO TABS: ORAL_TABLET | ORAL | Status: DC

## 2013-11-12 NOTE — Progress Notes (Signed)
HPI  Pt presents to the clinic today with c/o headache, cough, chest congestions, fever, sore throat and shortness of breath. She reports the cough started about 5 days ago. The cough is . She does report her fevers have been as high as 101. She has no history of allergies, asthma or breathing problems. She has had sick contacts.  Review of Systems      Past Medical History  Diagnosis Date  . Hyperlipidemia   . Hypertension   . Low back pain   . Morbid obesity   . Diabetes mellitus     type II  . Thyroid disease     hypothyroidism  . GERD (gastroesophageal reflux disease)   . Lymphedema   . Lumbosacral spondylosis without myelopathy   . Pain in limb   . Lumbago   . Facet syndrome, lumbar   . Lymphedema   . Primary localized osteoarthrosis, lower leg   . Hypercholesteremia     Family History  Problem Relation Age of Onset  . Cancer Mother     lung and colon  . Stroke Sister     History   Social History  . Marital Status: Married    Spouse Name: N/A    Number of Children: 1  . Years of Education: N/A   Occupational History  . Diability    Social History Main Topics  . Smoking status: Never Smoker   . Smokeless tobacco: Never Used  . Alcohol Use: No  . Drug Use: No  . Sexual Activity: Not on file   Other Topics Concern  . Not on file   Social History Narrative   Lives in [place], one adult child      Caffeine use: coffee daily in am    Allergies  Allergen Reactions  . Ace Inhibitors     Angioedema   . Erythromycin     REACTION: Nausea and vomiting     Constitutional: Positive headache, fatigue and fever. Denies abrupt weight changes.  HEENT:  Positive sore throat. Denies eye redness, eye pain, pressure behind the eyes, facial pain, nasal congestion, ear pain, ringing in the ears, wax buildup, runny nose or bloody nose. Respiratory: Positive cough, chest congestions and shortness of breath.   Cardiovascular: Denies chest pain, chest  tightness, palpitations or swelling in the hands or feet.   No other specific complaints in a complete review of systems (except as listed in HPI above).  Objective:   BP 156/88  Pulse 121  Temp(Src) 98.4 F (36.9 C) (Oral)  Wt 376 lb 8 oz (170.779 kg)  SpO2 93% Wt Readings from Last 3 Encounters:  11/12/13 376 lb 8 oz (170.779 kg)  10/01/13 376 lb (170.552 kg)  09/02/13 366 lb (166.017 kg)     General: Appears her stated age, chronically ill appearing in NAD. HEENT: Head: normal shape and size; Eyes: sclera white, no icterus, conjunctiva pink, PERRLA and EOMs intact; Ears: Tm's gray and intact, normal light reflex; Nose: mucosa pink and moist, septum midline; Throat/Mouth: + PND. Teeth present, mucosa erythematous and moist, no exudate noted, no lesions or ulcerations noted.  Neck: Mild cervical lymphadenopathy. Neck supple, trachea midline. No massses, lumps or thyromegaly present.  Cardiovascular: Normal rate and rhythm. S1,S2 noted.  No murmur, rubs or gallops noted. No JVD or BLE edema. No carotid bruits noted. Pulmonary/Chest: Normal effort and scattered rhonchi throughout, bilateral expiratory wheezing. No respiratory distress.      Assessment & Plan:   Acute Bronchitis:  Get  some rest and drink plenty of water Do salt water gargles for the sore throat eRx for Azithromax x 5 days eRx for Tessalon pearls as needed for cough Albuterol inhaler refilled today  RTC as needed or if symptoms persist.

## 2013-11-12 NOTE — Progress Notes (Signed)
Pre-visit discussion using our clinic review tool. No additional management support is needed unless otherwise documented below in the visit note.  

## 2013-11-12 NOTE — Patient Instructions (Signed)
Acute Bronchitis Bronchitis is inflammation of the airways that extend from the windpipe into the lungs (bronchi). The inflammation often causes mucus to develop. This leads to a cough, which is the most common symptom of bronchitis.  In acute bronchitis, the condition usually develops suddenly and goes away over time, usually in a couple weeks. Smoking, allergies, and asthma can make bronchitis worse. Repeated episodes of bronchitis may cause further lung problems.  CAUSES Acute bronchitis is most often caused by the same virus that causes a cold. The virus can spread from person to person (contagious).  SIGNS AND SYMPTOMS   Cough.   Fever.   Coughing up mucus.   Body aches.   Chest congestion.   Chills.   Shortness of breath.   Sore throat.  DIAGNOSIS  Acute bronchitis is usually diagnosed through a physical exam. Tests, such as chest X-rays, are sometimes done to rule out other conditions.  TREATMENT  Acute bronchitis usually goes away in a couple weeks. Often times, no medical treatment is necessary. Medicines are sometimes given for relief of fever or cough. Antibiotics are usually not needed but may be prescribed in certain situations. In some cases, an inhaler may be recommended to help reduce shortness of breath and control the cough. A cool mist vaporizer may also be used to help thin bronchial secretions and make it easier to clear the chest.  HOME CARE INSTRUCTIONS  Get plenty of rest.   Drink enough fluids to keep your urine clear or pale yellow (unless you have a medical condition that requires fluid restriction). Increasing fluids may help thin your secretions and will prevent dehydration.   Only take over-the-counter or prescription medicines as directed by your health care provider.   Avoid smoking and secondhand smoke. Exposure to cigarette smoke or irritating chemicals will make bronchitis worse. If you are a smoker, consider using nicotine gum or skin  patches to help control withdrawal symptoms. Quitting smoking will help your lungs heal faster.   Reduce the chances of another bout of acute bronchitis by washing your hands frequently, avoiding people with cold symptoms, and trying not to touch your hands to your mouth, nose, or eyes.   Follow up with your health care provider as directed.  SEEK MEDICAL CARE IF: Your symptoms do not improve after 1 week of treatment.  SEEK IMMEDIATE MEDICAL CARE IF:  You develop an increased fever or chills.   You have chest pain.   You have severe shortness of breath.  You have bloody sputum.   You develop dehydration.  You develop fainting.  You develop repeated vomiting.  You develop a severe headache. MAKE SURE YOU:   Understand these instructions.  Will watch your condition.  Will get help right away if you are not doing well or get worse. Document Released: 11/24/2004 Document Revised: 06/19/2013 Document Reviewed: 04/09/2013 [employer] Patient Information 2014 [employer].  

## 2013-11-13 ENCOUNTER — Other Ambulatory Visit: Payer: Self-pay | Admitting: Family Medicine

## 2013-11-26 ENCOUNTER — Other Ambulatory Visit: Payer: Self-pay | Admitting: *Deleted

## 2013-11-26 MED ORDER — HYDROCODONE-ACETAMINOPHEN 10-325 MG PO TABS: 1.0000 | ORAL_TABLET | Freq: Three times a day (TID) | ORAL | Status: DC

## 2013-11-26 MED ORDER — MORPHINE SULFATE ER 15 MG PO TBCR: 15.0000 mg | EXTENDED_RELEASE_TABLET | Freq: Three times a day (TID) | ORAL | Status: DC

## 2013-11-26 NOTE — Telephone Encounter (Signed)
RX printed early for controlled medication for the visit with RN on 12/03/13 (to be signed by MD)

## 2013-12-03 ENCOUNTER — Encounter: Payer: Self-pay | Admitting: *Deleted

## 2013-12-03 ENCOUNTER — Encounter: Payer: Self-pay | Admitting: Family Medicine

## 2013-12-03 ENCOUNTER — Ambulatory Visit (INDEPENDENT_AMBULATORY_CARE_PROVIDER_SITE_OTHER): Payer: Medicare Other | Admitting: Family Medicine

## 2013-12-03 ENCOUNTER — Ambulatory Visit (INDEPENDENT_AMBULATORY_CARE_PROVIDER_SITE_OTHER)
Admission: RE | Admit: 2013-12-03 | Discharge: 2013-12-03 | Disposition: A | Discharge status: Home / Self Care | Payer: Medicare Other | Source: Ambulatory Visit | Attending: Family Medicine | Admitting: Family Medicine

## 2013-12-03 ENCOUNTER — Encounter: Payer: Medicare Other | Attending: Physical Medicine & Rehabilitation | Admitting: *Deleted

## 2013-12-03 VITALS — BP 156/83 | HR 115 | Temp 97.7°F | Wt 371.5 lb

## 2013-12-03 VITALS — BP 149/73 | HR 91 | Resp 16

## 2013-12-03 DIAGNOSIS — G8929 Other chronic pain: Secondary | ICD-10-CM | POA: Diagnosis not present

## 2013-12-03 DIAGNOSIS — M19019 Primary osteoarthritis, unspecified shoulder: Secondary | ICD-10-CM | POA: Diagnosis not present

## 2013-12-03 DIAGNOSIS — Z79899 Other long term (current) drug therapy: Secondary | ICD-10-CM | POA: Insufficient documentation

## 2013-12-03 DIAGNOSIS — I10 Essential (primary) hypertension: Secondary | ICD-10-CM | POA: Diagnosis not present

## 2013-12-03 DIAGNOSIS — M67919 Unspecified disorder of synovium and tendon, unspecified shoulder: Secondary | ICD-10-CM

## 2013-12-03 DIAGNOSIS — M25561 Pain in right knee: Secondary | ICD-10-CM

## 2013-12-03 DIAGNOSIS — M47817 Spondylosis without myelopathy or radiculopathy, lumbosacral region: Secondary | ICD-10-CM | POA: Insufficient documentation

## 2013-12-03 DIAGNOSIS — E7800 Pure hypercholesterolemia, unspecified: Secondary | ICD-10-CM | POA: Insufficient documentation

## 2013-12-03 DIAGNOSIS — M25519 Pain in unspecified shoulder: Secondary | ICD-10-CM | POA: Insufficient documentation

## 2013-12-03 DIAGNOSIS — E785 Hyperlipidemia, unspecified: Secondary | ICD-10-CM | POA: Insufficient documentation

## 2013-12-03 DIAGNOSIS — M7540 Impingement syndrome of unspecified shoulder: Secondary | ICD-10-CM

## 2013-12-03 DIAGNOSIS — E039 Hypothyroidism, unspecified: Secondary | ICD-10-CM | POA: Diagnosis not present

## 2013-12-03 DIAGNOSIS — M25569 Pain in unspecified knee: Secondary | ICD-10-CM | POA: Diagnosis not present

## 2013-12-03 DIAGNOSIS — E119 Type 2 diabetes mellitus without complications: Secondary | ICD-10-CM | POA: Insufficient documentation

## 2013-12-03 DIAGNOSIS — M719 Bursopathy, unspecified: Secondary | ICD-10-CM

## 2013-12-03 DIAGNOSIS — M25551 Pain in right hip: Secondary | ICD-10-CM

## 2013-12-03 DIAGNOSIS — K219 Gastro-esophageal reflux disease without esophagitis: Secondary | ICD-10-CM | POA: Diagnosis not present

## 2013-12-03 DIAGNOSIS — M7542 Impingement syndrome of left shoulder: Secondary | ICD-10-CM | POA: Insufficient documentation

## 2013-12-03 DIAGNOSIS — M25562 Pain in left knee: Secondary | ICD-10-CM

## 2013-12-03 MED ORDER — DEXAMETHASONE SODIUM PHOSPHATE 10 MG/ML IJ SOLN
10.0000 mg | Freq: Once | INTRAMUSCULAR | Status: AC
  Administered 2013-12-03: 10 mg via INTRAVENOUS

## 2013-12-03 NOTE — Progress Notes (Signed)
Here for pill count and medication refills. Hydrocodone 10/325 # 59  Fill date 11/05/13    Today NV# 10 MS Contin 15 mg # 4 Fill date 11/05/13 Today NV# 10  VSS   No changes to her medications.  She did just come from an appointment for her rotator cuff pain and received a cortisone shot for it.  She has had no falls and is a moderate fall risk.  Fall prevention handout given. Return to see Dr Letta Pate in a month.  Appt is already scheduled.

## 2013-12-03 NOTE — Progress Notes (Signed)
Pre-visit discussion using our clinic review tool. No additional management support is needed unless otherwise documented below in the visit note.  

## 2013-12-03 NOTE — Patient Instructions (Signed)
Follow up one one month with DR Kirsteins (scheduled)

## 2013-12-03 NOTE — Progress Notes (Signed)
Subjective:    Patient ID: Theresa Huffman, female    DOB: Apr 09, 1946, 68 y.o.   MRN: [id number]  HPI  Very pleasant morbidly obese female here for left shoulder pain x 5 days.  No known injury.  Pain extends from top of shoulder down to wrist.  Hand and fingers not affected.  Lifting arm overhead is the most painful.  No swelling or erythema that she has noted.  Takes Ibuprofen and Morphine daily which are not helping with symptoms.  Patient Active Problem List   Diagnosis Date Noted  . Exertional dyspnea 08/19/2012  . Tachycardia 08/15/2012  . Bilateral chronic knee pain 01/11/2012  . Right hip pain 01/11/2012  . Burning sensation of feet 01/11/2012  . LYMPHEDEMA, SEVERE 09/01/2010  . HYPOTHYROIDISM 01/25/2010  . Type II or unspecified type diabetes mellitus with neurological manifestations, uncontrolled(250.62) 01/25/2010  . UNSPECIFIED VITAMIN D DEFICIENCY 01/25/2010  . HYPERLIPIDEMIA 01/25/2010  . OBESITY, MORBID 01/25/2010  . HYPERTENSION 01/25/2010  . UNSPECIFIED VENOUS INSUFFICIENCY 01/25/2010  . GERD 01/25/2010  . Lumbago 01/25/2010   Past Medical History  Diagnosis Date  . Hyperlipidemia   . Hypertension   . Low back pain   . Morbid obesity   . Diabetes mellitus     type II  . Thyroid disease     hypothyroidism  . GERD (gastroesophageal reflux disease)   . Lymphedema   . Lumbosacral spondylosis without myelopathy   . Pain in limb   . Lumbago   . Facet syndrome, lumbar   . Lymphedema   . Primary localized osteoarthrosis, lower leg   . Hypercholesteremia    Past Surgical History  Procedure Laterality Date  . Cholecystectomy    . Tubal ligation     History  Substance Use Topics  . Smoking status: Never Smoker   . Smokeless tobacco: Never Used  . Alcohol Use: No   Family History  Problem Relation Age of Onset  . Cancer Mother     lung and colon  . Stroke Sister    Allergies  Allergen Reactions  . Ace Inhibitors     Angioedema   .  Erythromycin     REACTION: Nausea and vomiting   Current Outpatient Prescriptions on File Prior to Visit  Medication Sig Dispense Refill  . albuterol (PROAIR HFA) 108 (90 BASE) MCG/ACT inhaler Inhale 2 puffs into the lungs every 4 (four) hours as needed.  1 Inhaler  1  . amLODipine (NORVASC) 5 MG tablet Take 1 tablet (5 mg total) by mouth daily.  90 tablet  3  . aspirin EC 81 MG tablet Take 162 mg by mouth daily.      . benzonatate (TESSALON) 200 MG capsule Take 1 capsule (200 mg total) by mouth 3 (three) times daily as needed for cough.  20 capsule  0  . Bromocriptine Mesylate 0.8 MG TABS Take 2 tablets po first thing in am daily  180 tablet  3  . Calcium Carbonate-Vitamin D 600-400 MG-UNIT per tablet Take 1 tablet by mouth daily. 1200mg  once daily      . furosemide (LASIX) 40 MG tablet Take 40 mg by mouth daily.      Marland Kitchen gabapentin (NEURONTIN) 300 MG capsule Take two capsule by mouth three times daily  180 capsule  5  . glipiZIDE (GLUCOTROL) 5 MG tablet Take 1 tablet (5 mg total) by mouth 2 (two) times daily before a meal.  60 tablet  5  . HYDROcodone-acetaminophen (NORCO) 10-325 MG per  tablet Take 1 tablet by mouth 3 (three) times daily. As needed for back or leg pain  90 tablet  0  . ibuprofen (ADVIL,MOTRIN) 800 MG tablet Take 1 tablet (800 mg total) by mouth 3 (three) times daily.  90 tablet  2  . INVOKANA 100 MG TABS       . metFORMIN (GLUCOPHAGE) 1000 MG tablet Take one tablet by mouth two times daily  60 tablet  5  . methocarbamol (ROBAXIN) 500 MG tablet Take 1 tablet (500 mg total) by mouth 3 (three) times daily.  90 tablet  2  . moexipril (UNIVASC) 15 MG tablet Take 15 mg by mouth daily.       Marland Kitchen morphine (MS CONTIN) 15 MG 12 hr tablet Take 1 tablet (15 mg total) by mouth 3 (three) times daily.  90 tablet  0  . potassium chloride SA (K-DUR,KLOR-CON) 20 MEQ tablet TAKE ONE (1) TABLET BY MOUTH EVERY DAY  30 tablet  5  . simvastatin (ZOCOR) 20 MG tablet TAKE 1 TABLET BY MOUTH DAILY  30  tablet  5  . SYNTHROID 175 MCG tablet TAKE ONE (1) TABLET BY MOUTH EVERY DAY  30 tablet  5  . tiZANidine (ZANAFLEX) 4 MG tablet Take 1 tablet (4 mg total) by mouth every 8 (eight) hours as needed.  60 tablet  0  . venlafaxine (EFFEXOR) 75 MG tablet Take three tablets by mouth every night at bedtime  90 tablet  3   No current facility-administered medications on file prior to visit.   The PMH, PSH, Social History, Family History, Medications, and allergies have been reviewed in [facility], and have been updated if relevant.   Review of Systems    See HPI Objective:   Physical Exam  BP 156/83  Pulse 115  Temp(Src) 97.7 F (36.5 C) (Oral)  Wt 371 lb 8 oz (168.511 kg)  SpO2 94%  Gen:  Alert, morbidly obese MSK:   Left shoulder normal to inspection +arch sign, pos empty can Normal grip strength bilaterally       Assessment & Plan:

## 2013-12-03 NOTE — Addendum Note (Signed)
Addended by: Modena Nunnery on: 12/03/2013 11:39 AM   Modules accepted: Orders

## 2013-12-03 NOTE — Assessment & Plan Note (Signed)
Given she has very limited ROM of shoulder and body habitus, unable to exclude full tear. IM decadron given in office today. Advised to watch blood sugars and NOT take NSAIDs for next 3 days. Will get xray today and refer to Dr. Lorelei Pont for further evaluation- i.e. Ultrasound, MRI. The patient indicates understanding of these issues and agrees with the plan.

## 2013-12-03 NOTE — Patient Instructions (Addendum)
You have rotator cuff impingement  Try to avoid painful activities (overhead activities, lifting with extended arm) as much as possible.  Can take tylenol in addition to this.  Make an appointment with Dr. Lorelei Pont on your way out.  Subacromial injection may be beneficial to help with pain and to decrease inflammation.  Biotin can help with hair growth.  STOP taking Ibuprofen for next 3 days.

## 2013-12-06 ENCOUNTER — Telehealth: Payer: Self-pay

## 2013-12-06 NOTE — Telephone Encounter (Signed)
Spoke to pt and advised per Dr Deborra Medina. Pt states that she will contact prescribing physician for dose change.

## 2013-12-06 NOTE — Telephone Encounter (Signed)
I cannot change her morphine dose unfortnately- this is not prescribed by me.  I recommend calling that physician to request this.

## 2013-12-06 NOTE — Telephone Encounter (Signed)
Pt left v/m; pt has appt with Dr Lorelei Pont on 12/11/13 and request stronger pain med for shoulder pain. Pain level now is 9; presently taking Ibuprofen 800 mg three times a day and Morphine 15 mg three times a day. Pt wants to increase Morphine. Advised pt not to take morphine more frequently than presently prescribed until hears Dr Hulen Shouts response; pt voiced understanding.

## 2013-12-09 ENCOUNTER — Ambulatory Visit: Payer: Medicare Other | Admitting: Family Medicine

## 2013-12-09 DIAGNOSIS — M719 Bursopathy, unspecified: Secondary | ICD-10-CM | POA: Diagnosis not present

## 2013-12-09 DIAGNOSIS — M67919 Unspecified disorder of synovium and tendon, unspecified shoulder: Secondary | ICD-10-CM | POA: Diagnosis not present

## 2013-12-11 ENCOUNTER — Ambulatory Visit: Payer: Medicare Other | Admitting: Family Medicine

## 2013-12-15 ENCOUNTER — Other Ambulatory Visit: Payer: Self-pay | Admitting: Family Medicine

## 2013-12-20 ENCOUNTER — Telehealth: Payer: Self-pay

## 2013-12-20 NOTE — Telephone Encounter (Signed)
Refill request form [place] for methocarbamol 500mg  1 tablet po tid #90.  Please advise.

## 2013-12-20 NOTE — Telephone Encounter (Signed)
Okay for Robaxin 500 mg 3 times a day #90 1 refill

## 2013-12-23 MED ORDER — METHOCARBAMOL 500 MG PO TABS: 500.0000 mg | ORAL_TABLET | Freq: Three times a day (TID) | ORAL | Status: DC

## 2013-12-23 NOTE — Telephone Encounter (Signed)
Robaxin refill sent to pharmacy.

## 2013-12-30 ENCOUNTER — Ambulatory Visit (HOSPITAL_BASED_OUTPATIENT_CLINIC_OR_DEPARTMENT_OTHER): Payer: Medicare Other | Admitting: Physical Medicine & Rehabilitation

## 2013-12-30 ENCOUNTER — Encounter: Payer: Self-pay | Admitting: Physical Medicine & Rehabilitation

## 2013-12-30 ENCOUNTER — Encounter: Payer: Medicare Other | Attending: Physical Medicine and Rehabilitation

## 2013-12-30 VITALS — BP 168/82 | HR 132 | Resp 14 | Ht 63.0 in | Wt 375.0 lb

## 2013-12-30 DIAGNOSIS — M25569 Pain in unspecified knee: Secondary | ICD-10-CM | POA: Diagnosis not present

## 2013-12-30 DIAGNOSIS — Z888 Allergy status to other drugs, medicaments and biological substances status: Secondary | ICD-10-CM | POA: Diagnosis not present

## 2013-12-30 DIAGNOSIS — M5137 Other intervertebral disc degeneration, lumbosacral region: Secondary | ICD-10-CM | POA: Diagnosis not present

## 2013-12-30 DIAGNOSIS — M62838 Other muscle spasm: Secondary | ICD-10-CM | POA: Insufficient documentation

## 2013-12-30 DIAGNOSIS — G8929 Other chronic pain: Secondary | ICD-10-CM | POA: Insufficient documentation

## 2013-12-30 DIAGNOSIS — E1142 Type 2 diabetes mellitus with diabetic polyneuropathy: Secondary | ICD-10-CM | POA: Insufficient documentation

## 2013-12-30 DIAGNOSIS — M545 Low back pain, unspecified: Secondary | ICD-10-CM | POA: Insufficient documentation

## 2013-12-30 DIAGNOSIS — E1149 Type 2 diabetes mellitus with other diabetic neurological complication: Secondary | ICD-10-CM | POA: Diagnosis not present

## 2013-12-30 DIAGNOSIS — M25562 Pain in left knee: Principal | ICD-10-CM

## 2013-12-30 DIAGNOSIS — I10 Essential (primary) hypertension: Secondary | ICD-10-CM | POA: Diagnosis not present

## 2013-12-30 DIAGNOSIS — M25561 Pain in right knee: Principal | ICD-10-CM

## 2013-12-30 MED ORDER — HYDROCODONE-ACETAMINOPHEN 10-325 MG PO TABS: 1.0000 | ORAL_TABLET | Freq: Three times a day (TID) | ORAL | Status: DC

## 2013-12-30 MED ORDER — MORPHINE SULFATE ER 30 MG PO TBCR: 30.0000 mg | EXTENDED_RELEASE_TABLET | Freq: Two times a day (BID) | ORAL | Status: DC

## 2013-12-30 NOTE — Patient Instructions (Signed)
We made a dosage change for your morphine. you may experience nausea vomiting constipation itching or drowsiness please call if you have any side effects

## 2013-12-30 NOTE — Progress Notes (Addendum)
Subjective:    Patient ID: Theresa Huffman, female    DOB: [birth year], 68 y.o.   MRN: [id number] Chief complaint is low back pain. HPI No recent falls Still complains of constant pain despite morphine 15 mg 3 times a day and hydrocodone 10 mg 3 times per day No side effects such as sleepiness nausea vomiting or constipation.  Pain Inventory Average Pain 8 Pain Right Now 7 My pain is burning and aching  In the last 24 hours, has pain interfered with the following? General activity 9 Relation with others 6 Enjoyment of life 8 What TIME of day is your pain at its worst? morning, evening Sleep (in general) Poor  Pain is worse with: walking, bending and standing Pain improves with: rest and medication Relief from Meds: 6  Mobility walk with assistance how many minutes can you walk? 10 ability to climb steps?  no do you drive?  yes use a wheelchair Do you have any goals in this area?  yes  Function disabled: date disabled na I need assistance with the following:  meal prep, household duties and shopping Do you have any goals in this area?  yes  Neuro/Psych weakness trouble walking depression  Prior Studies Any changes since last visit?  no  Physicians involved in your care Any changes since last visit?  no   Family History  Problem Relation Age of Onset  . Cancer Mother     lung and colon  . Stroke Sister    History   Social History  . Marital Status: Married    Spouse Name: N/A    Number of Children: 1  . Years of Education: N/A   Occupational History  . Diability    Social History Main Topics  . Smoking status: Never Smoker   . Smokeless tobacco: Never Used  . Alcohol Use: No  . Drug Use: No  . Sexual Activity: None   Other Topics Concern  . None   Social History Narrative   Lives in [place], one adult child      Caffeine use: coffee daily in am   Past Surgical History  Procedure Laterality Date  . Cholecystectomy    . Tubal  ligation     Past Medical History  Diagnosis Date  . Hyperlipidemia   . Hypertension   . Low back pain   . Morbid obesity   . Diabetes mellitus     type II  . Thyroid disease     hypothyroidism  . GERD (gastroesophageal reflux disease)   . Lymphedema   . Lumbosacral spondylosis without myelopathy   . Pain in limb   . Lumbago   . Facet syndrome, lumbar   . Lymphedema   . Primary localized osteoarthrosis, lower leg   . Hypercholesteremia    BP 168/82  Pulse 132  Resp 14  Ht 5\' 3"  (1.6 m)  Wt 375 lb (170.099 kg)  BMI 66.44 kg/m2  SpO2 90%  Opioid Risk Score:   Fall Risk Score: Moderate Fall Risk (6-13 points) (pt educated on fall risk, brochure given to pt.)    Review of Systems  Musculoskeletal: Positive for back pain and gait problem.  Neurological: Positive for weakness.  Psychiatric/Behavioral: Positive for dysphoric mood.  All other systems reviewed and are negative.       Objective:   Physical Exam  Nursing note and vitals reviewed. Constitutional: She appears well-developed.  Morbidly obese  HENT:  Head: Normocephalic and atraumatic.  Eyes: Conjunctivae and  EOM are normal. Pupils are equal, round, and reactive to light.  Musculoskeletal:       Lumbar back: She exhibits decreased range of motion. She exhibits no tenderness.  Reduced lumbar extension greater than lumbar flexion Pain with lumbar extension  No pain with hip internal/external rotation Mild pain with bilateral knee flexion and extension  Neurological: She displays no atrophy and no tremor. She exhibits normal muscle tone. Gait abnormal.  Normal strength bilateral lower extremity Ambulates with bilateral Loftstrand crutches Bilateral valgus deformity and knees Negative straight leg raising test  Psychiatric: She has a normal mood and affect.          Assessment & Plan:  1. Lumbar pain chronic, no radiculopathy. Imaging studies reviewed, lumbar degenerative disc L4-5 and  L5-S1 Patient has been on any chronic dose of morphine sulfate extended release 50 mg 3 times per day as well as hydrocodone 10 mg 3 times per day. She does not feel she gets inadequate degree of pain relief from the morphine. We discussed treatment options. Will increase morphine sulfate extended release to :30 milligrams every 12 hours and continue hydrocodone 10 mg 3 times per day  2. Bi lateral knee osteoarthritis currently less symptomatic than back, no further treatment needed at this time  Morbid obesity negatively impacts ability to exercise. She cannot walk on a regular basis. She states she cannot tolerate being in the pool. Practically speaking she could not do an exercise bike She is not considering any type of surgical procedure although this may be her best chance for meaningful weight loss. She has lost about 25 pounds in the year with diet

## 2014-01-07 ENCOUNTER — Telehealth: Payer: Self-pay

## 2014-01-07 NOTE — Telephone Encounter (Signed)
is there anything else you would like to ask your physician? YES. Is it safe for me to take Garcenia Cambogia (hca) ?

## 2014-01-07 NOTE — Telephone Encounter (Signed)
My resources report that this is used for weight loss. There is not much known about it how it works or if it would interact with any other medication. There is really not enough information for me to make a recommendation. You may ask your primary physician

## 2014-01-07 NOTE — Telephone Encounter (Signed)
Patient advised to contact primary physician regarding garcenia cambogia.  She agreed.

## 2014-01-08 ENCOUNTER — Other Ambulatory Visit: Payer: Self-pay

## 2014-01-08 MED ORDER — GABAPENTIN 300 MG PO CAPS: ORAL_CAPSULE | ORAL | Status: DC

## 2014-01-12 ENCOUNTER — Other Ambulatory Visit: Payer: Self-pay | Admitting: Family Medicine

## 2014-01-27 ENCOUNTER — Other Ambulatory Visit: Payer: Self-pay | Admitting: *Deleted

## 2014-01-27 MED ORDER — MORPHINE SULFATE ER 30 MG PO TBCR: 30.0000 mg | EXTENDED_RELEASE_TABLET | Freq: Two times a day (BID) | ORAL | Status: DC

## 2014-01-27 MED ORDER — HYDROCODONE-ACETAMINOPHEN 10-325 MG PO TABS: 1.0000 | ORAL_TABLET | Freq: Three times a day (TID) | ORAL | Status: DC

## 2014-01-29 ENCOUNTER — Encounter: Payer: Medicare Other | Attending: Physical Medicine & Rehabilitation | Admitting: *Deleted

## 2014-01-29 ENCOUNTER — Encounter: Payer: Self-pay | Admitting: *Deleted

## 2014-01-29 VITALS — BP 155/79 | HR 79 | Resp 14

## 2014-01-29 DIAGNOSIS — G8929 Other chronic pain: Secondary | ICD-10-CM | POA: Insufficient documentation

## 2014-01-29 DIAGNOSIS — I10 Essential (primary) hypertension: Secondary | ICD-10-CM | POA: Diagnosis not present

## 2014-01-29 DIAGNOSIS — M25561 Pain in right knee: Secondary | ICD-10-CM

## 2014-01-29 DIAGNOSIS — Z79899 Other long term (current) drug therapy: Secondary | ICD-10-CM | POA: Insufficient documentation

## 2014-01-29 DIAGNOSIS — M545 Low back pain, unspecified: Secondary | ICD-10-CM | POA: Diagnosis not present

## 2014-01-29 DIAGNOSIS — E1149 Type 2 diabetes mellitus with other diabetic neurological complication: Secondary | ICD-10-CM | POA: Diagnosis not present

## 2014-01-29 DIAGNOSIS — M62838 Other muscle spasm: Secondary | ICD-10-CM | POA: Diagnosis not present

## 2014-01-29 DIAGNOSIS — M7542 Impingement syndrome of left shoulder: Secondary | ICD-10-CM

## 2014-01-29 DIAGNOSIS — E1142 Type 2 diabetes mellitus with diabetic polyneuropathy: Secondary | ICD-10-CM | POA: Insufficient documentation

## 2014-01-29 DIAGNOSIS — M25569 Pain in unspecified knee: Secondary | ICD-10-CM | POA: Diagnosis not present

## 2014-01-29 DIAGNOSIS — M25562 Pain in left knee: Secondary | ICD-10-CM

## 2014-01-29 NOTE — Patient Instructions (Signed)
Follow up one month with Danella Sensing NP and 2 months with Dr Letta Pate

## 2014-01-29 NOTE — Progress Notes (Signed)
Here for pill count and medication refills. Hydrocodone 01/01/14 # 99  Fill date 01/01/14  Today NV# 9   MS Contin 30 mg  # 16 Fill date 01/01/14 today NV# 7. VSS    No changes.  Pill counts are appropriate and refills given.  She will return next month to see our NP and 2 months with Dr Letta Pate

## 2014-02-03 ENCOUNTER — Other Ambulatory Visit: Payer: Self-pay

## 2014-02-03 MED ORDER — IBUPROFEN 800 MG PO TABS: 800.0000 mg | ORAL_TABLET | Freq: Three times a day (TID) | ORAL | Status: DC

## 2014-02-09 ENCOUNTER — Other Ambulatory Visit: Payer: Self-pay | Admitting: Family Medicine

## 2014-02-13 ENCOUNTER — Ambulatory Visit (INDEPENDENT_AMBULATORY_CARE_PROVIDER_SITE_OTHER): Payer: Medicare Other | Admitting: Family Medicine

## 2014-02-13 ENCOUNTER — Encounter: Payer: Self-pay | Admitting: Family Medicine

## 2014-02-13 VITALS — BP 158/80 | HR 108 | Temp 98.7°F | Ht 63.0 in | Wt 390.0 lb

## 2014-02-13 DIAGNOSIS — I10 Essential (primary) hypertension: Secondary | ICD-10-CM

## 2014-02-13 DIAGNOSIS — E785 Hyperlipidemia, unspecified: Secondary | ICD-10-CM

## 2014-02-13 DIAGNOSIS — J209 Acute bronchitis, unspecified: Secondary | ICD-10-CM

## 2014-02-13 DIAGNOSIS — E039 Hypothyroidism, unspecified: Secondary | ICD-10-CM

## 2014-02-13 DIAGNOSIS — R059 Cough, unspecified: Secondary | ICD-10-CM | POA: Insufficient documentation

## 2014-02-13 DIAGNOSIS — E1169 Type 2 diabetes mellitus with other specified complication: Secondary | ICD-10-CM

## 2014-02-13 DIAGNOSIS — E1149 Type 2 diabetes mellitus with other diabetic neurological complication: Secondary | ICD-10-CM

## 2014-02-13 DIAGNOSIS — R05 Cough: Secondary | ICD-10-CM | POA: Insufficient documentation

## 2014-02-13 DIAGNOSIS — E118 Type 2 diabetes mellitus with unspecified complications: Secondary | ICD-10-CM

## 2014-02-13 LAB — BASIC METABOLIC PANEL
BUN: 14 mg/dL (ref 6–23)
CALCIUM: 9.2 mg/dL (ref 8.4–10.5)
CO2: 28 mEq/L (ref 19–32)
Chloride: 96 mEq/L (ref 96–112)
Creatinine, Ser: 0.9 mg/dL (ref 0.4–1.2)
GFR: 64.23 mL/min (ref >60.00)
GLUCOSE: 153 mg/dL — AB (ref 70–99)
Potassium: 4.3 mEq/L (ref 3.5–5.1)
SODIUM: 138 meq/L (ref 135–145)

## 2014-02-13 LAB — MICROALBUMIN / CREATININE URINE RATIO
CREATININE, U: 232.8 mg/dL
MICROALB UR: 2.4 mg/dL — AB (ref 0.0–1.9)
Microalb Creat Ratio: 1 mg/g (ref 0.0–30.0)

## 2014-02-13 LAB — HEMOGLOBIN A1C: HEMOGLOBIN A1C: 7.8 % — AB (ref 4.6–6.5)

## 2014-02-13 MED ORDER — GLIPIZIDE 5 MG PO TABS: ORAL_TABLET | ORAL | Status: DC

## 2014-02-13 MED ORDER — AZITHROMYCIN 250 MG PO TABS: ORAL_TABLET | ORAL | Status: DC

## 2014-02-13 NOTE — Progress Notes (Signed)
Pre visit review using our clinic review tool, if applicable. No additional management support is needed unless otherwise documented below in the visit note. 

## 2014-02-13 NOTE — Progress Notes (Signed)
Subjective:    Patient ID: Theresa Huffman, female    DOB: 04-07-1946, 68 y.o.   MRN: [id number]  HPI  68 yo with h/o morbid obesity, poorly controlled DM, HLD, HTN here for med refills.   DM- poorly controlled so I referred her to endocrinology.  Last saw Dr. Cruzita Lederer on 07/10/13- note reviewed.  Cycloset started and advised cont Metformin and Invokana too mg, Glipizide and she was supposed to follow up in 1 month.  She did not keep appt.  She is no longer taking Invokana.  Reports that she needs glipizide refills today. Lab Results  Component Value Date   HGBA1C 8.2* 07/10/2013   Checking FSBS every morning- feels improved.  This morning it was 130.  Thinks she has bronchitis- past 4 days, increased cough and congestion.  No SOB.  Subjective fevers. No sinus pressure.  Patient Active Problem List   Diagnosis Date Noted  . Degeneration of lumbar or lumbosacral intervertebral disc 12/30/2013  . Rotator cuff impingement syndrome of left shoulder 12/03/2013  . Exertional dyspnea 08/19/2012  . Tachycardia 08/15/2012  . Bilateral chronic knee pain 01/11/2012  . Right hip pain 01/11/2012  . Burning sensation of feet 01/11/2012  . LYMPHEDEMA, SEVERE 09/01/2010  . HYPOTHYROIDISM 01/25/2010  . Type II or unspecified type diabetes mellitus with neurological manifestations, uncontrolled(250.62) 01/25/2010  . UNSPECIFIED VITAMIN D DEFICIENCY 01/25/2010  . HYPERLIPIDEMIA 01/25/2010  . OBESITY, MORBID 01/25/2010  . HYPERTENSION 01/25/2010  . UNSPECIFIED VENOUS INSUFFICIENCY 01/25/2010  . GERD 01/25/2010  . Lumbago 01/25/2010   Past Medical History  Diagnosis Date  . Hyperlipidemia   . Hypertension   . Low back pain   . Morbid obesity   . Diabetes mellitus     type II  . Thyroid disease     hypothyroidism  . GERD (gastroesophageal reflux disease)   . Lymphedema   . Lumbosacral spondylosis without myelopathy   . Pain in limb   . Lumbago   . Facet syndrome, lumbar   .  Lymphedema   . Primary localized osteoarthrosis, lower leg   . Hypercholesteremia    Past Surgical History  Procedure Laterality Date  . Cholecystectomy    . Tubal ligation     History  Substance Use Topics  . Smoking status: Never Smoker   . Smokeless tobacco: Never Used  . Alcohol Use: No   Family History  Problem Relation Age of Onset  . Cancer Mother     lung and colon  . Stroke Sister    Allergies  Allergen Reactions  . Ace Inhibitors     Angioedema   . Erythromycin     REACTION: Nausea and vomiting   Current Outpatient Prescriptions on File Prior to Visit  Medication Sig Dispense Refill  . albuterol (PROAIR HFA) 108 (90 BASE) MCG/ACT inhaler Inhale 2 puffs into the lungs every 4 (four) hours as needed.  1 Inhaler  1  . amLODipine (NORVASC) 5 MG tablet Take 1 tablet (5 mg total) by mouth daily.  90 tablet  3  . aspirin EC 81 MG tablet Take 162 mg by mouth daily.      . benzonatate (TESSALON) 200 MG capsule Take 1 capsule (200 mg total) by mouth 3 (three) times daily as needed for cough.  20 capsule  0  . Bromocriptine Mesylate 0.8 MG TABS Take 2 tablets po first thing in am daily  180 tablet  3  . Calcium Carbonate-Vitamin D 600-400 MG-UNIT per tablet Take 1 tablet  by mouth daily. 1200mg  once daily      . furosemide (LASIX) 40 MG tablet Take 40 mg by mouth daily.      Marland Kitchen gabapentin (NEURONTIN) 300 MG capsule Take two capsule by mouth three times daily  180 capsule  5  . glipiZIDE (GLUCOTROL) 5 MG tablet TAKE 1 TABLET BY MOUTH TWICE A DAY BEFORE A MEAL  60 tablet  0  . HYDROcodone-acetaminophen (NORCO) 10-325 MG per tablet Take 1 tablet by mouth 3 (three) times daily. As needed for back or leg pain  90 tablet  0  . ibuprofen (ADVIL,MOTRIN) 800 MG tablet Take 1 tablet (800 mg total) by mouth 3 (three) times daily.  90 tablet  2  . metFORMIN (GLUCOPHAGE) 1000 MG tablet Take one tablet by mouth two times daily  60 tablet  5  . methocarbamol (ROBAXIN) 500 MG tablet Take 1  tablet (500 mg total) by mouth 3 (three) times daily.  90 tablet  1  . moexipril (UNIVASC) 15 MG tablet Take 15 mg by mouth daily.       Marland Kitchen morphine (MS CONTIN) 30 MG 12 hr tablet Take 1 tablet (30 mg total) by mouth every 12 (twelve) hours.  60 tablet  0  . potassium chloride SA (K-DUR,KLOR-CON) 20 MEQ tablet TAKE ONE (1) TABLET BY MOUTH EVERY DAY  30 tablet  5  . simvastatin (ZOCOR) 20 MG tablet TAKE 1 TABLET BY MOUTH DAILY  30 tablet  3  . SYNTHROID 175 MCG tablet TAKE ONE (1) TABLET BY MOUTH EVERY DAY  30 tablet  5  . tiZANidine (ZANAFLEX) 4 MG tablet Take 1 tablet (4 mg total) by mouth every 8 (eight) hours as needed.  60 tablet  0  . venlafaxine (EFFEXOR) 75 MG tablet Take three tablets by mouth every night at bedtime  90 tablet  3   No current facility-administered medications on file prior to visit.   The PMH, PSH, Social History, Family History, Medications, and allergies have been reviewed in [facility], and have been updated if relevant.   Review of Systems Denies any CP, SOB or LE edema.    Objective:   Physical Exam BP 158/80  Pulse 108  Temp(Src) 98.7 F (37.1 C) (Tympanic)  Ht 5\' 3"  (1.6 m)  Wt 390 lb (176.903 kg)  BMI 69.10 kg/m2  SpO2 93% Gen:  Alert, morbidly obese, NAD HEENT:  MMM, no sinus TTP Resp: CTA bilaterally CVS:  RRR Neuro:  Walks with cane  Diabetic foot exam: Normal inspection No skin breakdown No calluses  Normal DP pulses Normal sensation to light touch and monofilament Nails normal     Assessment & Plan:  .

## 2014-02-13 NOTE — Patient Instructions (Addendum)
Good to see you. Please make an appointment with Dr. Cruzita Lederer today.  Don't fill your zpack prescription unless your symptoms get worse over next few days.

## 2014-02-13 NOTE — Assessment & Plan Note (Signed)
Refilled glipizide. Will check a1c and urine micro today so Dr. Cruzita Lederer will have labs but needs to make an appt with Dr. Cruzita Lederer, she agreed.

## 2014-02-13 NOTE — Assessment & Plan Note (Signed)
Likely viral. Advised supportive care. She wants abx but I explained to her that abx overuse is very harmful.  Rx for zpack printed out but advised her not to fill it unless symptoms worsen over next several days. The patient indicates understanding of these issues and agrees with the plan. Call or return to clinic prn if these symptoms worsen or fail to improve as anticipated.

## 2014-02-26 ENCOUNTER — Ambulatory Visit (INDEPENDENT_AMBULATORY_CARE_PROVIDER_SITE_OTHER): Payer: Medicare Other | Admitting: Internal Medicine

## 2014-02-26 ENCOUNTER — Encounter: Payer: Self-pay | Admitting: Internal Medicine

## 2014-02-26 ENCOUNTER — Telehealth: Payer: Self-pay

## 2014-02-26 VITALS — BP 112/70 | HR 86 | Temp 98.4°F | Resp 12 | Wt 383.8 lb

## 2014-02-26 DIAGNOSIS — E1149 Type 2 diabetes mellitus with other diabetic neurological complication: Secondary | ICD-10-CM | POA: Diagnosis not present

## 2014-02-26 NOTE — Telephone Encounter (Signed)
Relevant patient education assigned to patient using Emmi. ° °

## 2014-02-26 NOTE — Progress Notes (Signed)
Patient ID: Theresa Huffman, female   DOB: Aug 23, 1946, 68 y.o.   MRN: [id number]  HPI: Theresa Huffman is a 68 y.o.-year-old female, returning for f/u for DM2, dx 2010, non-insulin-dependent, uncontrolled, with complications (Peripheral neuropathy, mild CKD). Last visit 6 mo ago.  Last hemoglobin A1c was: Lab Results  Component Value Date   HGBA1C 7.8* 02/13/2014   HGBA1C 8.2* 07/10/2013   HGBA1C 8.2* 04/18/2013  Prev. HbA1C was 8.5% in 04/2011. She believes her HbA1c increased due to getting back to eating sweats.   Pt is on a regimen of: - Metformin 1000 bid - Invokana 300 mg - Glipizide 5 bid We started Cycloset - 1.6 mg >> ran out of it and did not refill We had to stop Januvia 100 - expensive. She is in the doughnut hole by the end of the year.   Pt checks her sugars once a day and they are improved, especially later in the day: - am: 150-200 (before relaxing her diet: 100-105) >> 158-210 >> 130-235, lately 127-164 >> 91-155 (195) - before lunch: 208 >> 186 >> n/c - before dinner: 125-159 >> 114-163 >> n/c  - 2h after dinner: 225 >> 139-189 >> n/c - bedtime: 175-205 >> 142-189 >> 115, 138, 141 No lows. Lowest sugar was 91; she has hypoglycemia awareness at 80. Highest sugar was low 195.  She was considering GBP, but is afraid of the sx. She is a stress eater and has a lot of stress at home.  - has mild CKD, last BUN/creatinine:  Lab Results  Component Value Date   BUN 14 02/13/2014   CREATININE 0.9 02/13/2014  Has h/o angioedema - ACEI stopped - last set of lipids: Lab Results  Component Value Date   CHOL 161 05/27/2013   HDL 44.60 05/27/2013   LDLDIRECT 81.4 05/27/2013   TRIG 258.0* 05/27/2013   CHOLHDL 4 05/27/2013  She is on Zocor. She takes ASA 81. - last eye exam was in 2013 >> due for an eye exam (04/2014). No DR.  - + numbness and tingling in her feet - improved lately  Pt also has a history of HTN, Hypothyroidism - last TSH 2.08 2 mo ago, vit D def., HL,  morbid obesity, lymphedema, vv insuff., GERD, lumbago, chronic bilateral knee pain. No h/o UTIs.  I reviewed pt's medications, allergies, PMH, social hx, family hx and no changes required, except as mentioned above.  ROS: Constitutional: no weight gain/loss, + fatigue, no subjective hyperthermia/hypothermia; Eyes: no blurry vision, no xerophthalmia ENT: + sore throat, no nodules palpated in throat, no dysphagia/odynophagia, + hoarseness Cardiovascular: no CP/SOB/palpitations/+ leg swelling Respiratory: + cough/no SOB Gastrointestinal: no N/V/D/no C Musculoskeletal: no muscle/+ joint aches Skin: no rashes, + hair loss  PE: BP 112/70  Pulse 86  Temp(Src) 98.4 F (36.9 C) (Oral)  Resp 12  Wt 383 lb 12.8 oz (174.091 kg)  SpO2 97% Wt Readings from Last 3 Encounters:  02/26/14 383 lb 12.8 oz (174.091 kg)  02/13/14 390 lb (176.903 kg)  12/30/13 375 lb (170.099 kg)  Per Pain management's scale, she lost 6 lbs in last month.  Constitutional: obesity grade 3, in NAD Eyes: PERRLA, EOMI, no exophthalmos ENT: moist mucous membranes, no thyromegaly, no cervical lymphadenopathy Cardiovascular: tachycardia, RR, No MRG Respiratory: CTA B Gastrointestinal: abdomen soft, NT, ND, BS+ Musculoskeletal: strength intact in all 4 Skin: moist, warm, no rashes Severe lymphedema bilateral legs  ASSESSMENT: 1. DM2, non-insulin-dependent, uncontrolled, with complications - PN, on Neurontin  PLAN:  1. Patient with long-standing, recently better controlled diabetes despite stopping Cycloset - I suggested to:  Continue Metformin 1000 mg 2x a day Continue Invokana 300 mg daily - no SEs  Continue Glipizide 5 mg 2x a day - advised her to continue checking her sugars at different times of the day - check 2 times a day, rotating checks >> need checks later in the day, too - Return to clinic in 3 months with sugar log

## 2014-02-26 NOTE — Patient Instructions (Signed)
Continue Metformin 1000 mg 2x a day Continue Invokana 300 mg daily in am Continue Glipizide 5 mg 2x daily.  Please return in 3 month with your sugar log.

## 2014-02-28 ENCOUNTER — Encounter: Payer: Self-pay | Admitting: Registered Nurse

## 2014-02-28 ENCOUNTER — Encounter: Payer: Medicare Other | Attending: Physical Medicine & Rehabilitation | Admitting: Registered Nurse

## 2014-02-28 VITALS — BP 190/84 | HR 107 | Resp 14 | Ht 63.0 in | Wt 383.0 lb

## 2014-02-28 DIAGNOSIS — Z5181 Encounter for therapeutic drug level monitoring: Secondary | ICD-10-CM

## 2014-02-28 DIAGNOSIS — Z79899 Other long term (current) drug therapy: Secondary | ICD-10-CM | POA: Diagnosis not present

## 2014-02-28 DIAGNOSIS — M5137 Other intervertebral disc degeneration, lumbosacral region: Secondary | ICD-10-CM

## 2014-02-28 DIAGNOSIS — M545 Low back pain, unspecified: Secondary | ICD-10-CM | POA: Insufficient documentation

## 2014-02-28 DIAGNOSIS — E1149 Type 2 diabetes mellitus with other diabetic neurological complication: Secondary | ICD-10-CM | POA: Diagnosis not present

## 2014-02-28 DIAGNOSIS — E1142 Type 2 diabetes mellitus with diabetic polyneuropathy: Secondary | ICD-10-CM | POA: Insufficient documentation

## 2014-02-28 DIAGNOSIS — M62838 Other muscle spasm: Secondary | ICD-10-CM | POA: Diagnosis not present

## 2014-02-28 DIAGNOSIS — M25569 Pain in unspecified knee: Secondary | ICD-10-CM | POA: Diagnosis not present

## 2014-02-28 DIAGNOSIS — I10 Essential (primary) hypertension: Secondary | ICD-10-CM | POA: Insufficient documentation

## 2014-02-28 DIAGNOSIS — M25561 Pain in right knee: Secondary | ICD-10-CM

## 2014-02-28 DIAGNOSIS — M25562 Pain in left knee: Secondary | ICD-10-CM

## 2014-02-28 DIAGNOSIS — G8929 Other chronic pain: Secondary | ICD-10-CM | POA: Diagnosis not present

## 2014-02-28 MED ORDER — HYDROCODONE-ACETAMINOPHEN 10-325 MG PO TABS: 1.0000 | ORAL_TABLET | Freq: Three times a day (TID) | ORAL | Status: DC

## 2014-02-28 MED ORDER — MORPHINE SULFATE ER 30 MG PO TBCR: 30.0000 mg | EXTENDED_RELEASE_TABLET | Freq: Two times a day (BID) | ORAL | Status: DC

## 2014-02-28 NOTE — Progress Notes (Signed)
Subjective:    Patient ID: Theresa Huffman, female    DOB: 08/16/1946, 68 y.o.   MRN: [id number]  HPI: Theresa Huffman is a 68 year old female who returns for follow up for chronic pain and medication refill. She says her pain is in her lower back, bilateral shoulders and knees.She rates her pain 7. Her current exercise regime bilateral shoulders. She had a cortisone injection in Left arm by Dr. Cay Schillings in orthopedist. Pain Inventory Average Pain 7 Pain Right Now 7 My pain is burning and aching  In the last 24 hours, has pain interfered with the following? General activity 8 Relation with others 6 Enjoyment of life 8 What TIME of day is your pain at its worst? morning, evening Sleep (in general) Poor  Pain is worse with: walking, bending and standing Pain improves with: rest and medication Relief from Meds: 6  Mobility walk with assistance how many minutes can you walk? 10 ability to climb steps?  no do you drive?  yes use a wheelchair transfers alone Do you have any goals in this area?  yes  Function disabled: date disabled na I need assistance with the following:  meal prep, household duties and shopping  Neuro/Psych weakness trouble walking depression  Prior Studies Any changes since last visit?  no  Physicians involved in your care Any changes since last visit?  no   Family History  Problem Relation Age of Onset  . Cancer Mother     lung and colon  . Stroke Sister    History   Social History  . Marital Status: Married    Spouse Name: N/A    Number of Children: 1  . Years of Education: N/A   Occupational History  . Diability    Social History Main Topics  . Smoking status: Never Smoker   . Smokeless tobacco: Never Used  . Alcohol Use: No  . Drug Use: No  . Sexual Activity: None   Other Topics Concern  . None   Social History Narrative   Lives in [place], one adult child      Caffeine use: coffee daily in am   Past  Surgical History  Procedure Laterality Date  . Cholecystectomy    . Tubal ligation     Past Medical History  Diagnosis Date  . Hyperlipidemia   . Hypertension   . Low back pain   . Morbid obesity   . Diabetes mellitus     type II  . Thyroid disease     hypothyroidism  . GERD (gastroesophageal reflux disease)   . Lymphedema   . Lumbosacral spondylosis without myelopathy   . Pain in limb   . Lumbago   . Facet syndrome, lumbar   . Lymphedema   . Primary localized osteoarthrosis, lower leg   . Hypercholesteremia    BP 190/84  Pulse 107  Resp 14  Ht 5\' 3"  (1.6 m)  Wt 383 lb (173.728 kg)  BMI 67.86 kg/m2  SpO2 92%  Opioid Risk Score:   Fall Risk Score: Moderate Fall Risk (6-13 points) (pt educated and given a brochure on fall risk previously)    Review of Systems  Musculoskeletal: Positive for back pain and gait problem.  Neurological: Positive for weakness.  Psychiatric/Behavioral:       Depression   All other systems reviewed and are negative.      Objective:   Physical Exam  Nursing note and vitals reviewed. Constitutional: She is oriented to  person, place, and time. She appears well-developed and well-nourished.  HENT:  Head: Normocephalic.  Neck: Normal range of motion. Neck supple.  Cardiovascular: Normal rate, regular rhythm and normal heart sounds.   Pulmonary/Chest: Effort normal and breath sounds normal.  Musculoskeletal:  Normal Muscle Bulk: Muscle Testing Reveals: Upper Extremities: Muscle Strength 5/5 Decreased ROM 45 degrees Deltoid Muscle and trapezius and Spine of scapula with Tenderness. Spinal Flexion: 45 degrees Lumbar Paraspinals Tenderness: L-4- L-5- S-1 Lower Extremities: Full ROM Ambulates with 4 arm crutches/ Loftstrand crutches. Arises from chair with ease   Neurological: She is alert and oriented to person, place, and time.  Skin: Skin is warm and dry.  Psychiatric: She has a normal mood and affect.          Assessment &  Plan:  1. Lumbar pain chronic, no radiculopathy:  Lumbar degenerative disc L4-5 and L5-S1. Refilled: HYDRcodone 10/325mg  one tablet TID #90. MS CONTIN 30 mg one evry 12 hours #60.  2. Bilateral knee osteoarthritis: Continue Robaxin. Use Heat therapy. Continue to current medication regime.  20 minutes of face to face patient care time was spent during this visit. All questions were encouraged and answered.  F/U in 1 month.

## 2014-03-10 ENCOUNTER — Other Ambulatory Visit: Payer: Self-pay

## 2014-03-10 MED ORDER — METHOCARBAMOL 500 MG PO TABS: 500.0000 mg | ORAL_TABLET | Freq: Three times a day (TID) | ORAL | Status: DC

## 2014-03-16 ENCOUNTER — Other Ambulatory Visit: Payer: Self-pay | Admitting: Family Medicine

## 2014-03-22 ENCOUNTER — Other Ambulatory Visit: Payer: Self-pay | Admitting: Family Medicine

## 2014-03-31 ENCOUNTER — Encounter: Payer: Self-pay | Admitting: Physical Medicine & Rehabilitation

## 2014-03-31 ENCOUNTER — Encounter: Payer: Medicare Other | Attending: Physical Medicine and Rehabilitation

## 2014-03-31 ENCOUNTER — Ambulatory Visit (HOSPITAL_BASED_OUTPATIENT_CLINIC_OR_DEPARTMENT_OTHER): Payer: Medicare Other | Admitting: Physical Medicine & Rehabilitation

## 2014-03-31 VITALS — BP 188/88 | HR 114 | Resp 16 | Wt 388.0 lb

## 2014-03-31 DIAGNOSIS — M75100 Unspecified rotator cuff tear or rupture of unspecified shoulder, not specified as traumatic: Secondary | ICD-10-CM

## 2014-03-31 DIAGNOSIS — IMO0002 Reserved for concepts with insufficient information to code with codable children: Secondary | ICD-10-CM | POA: Diagnosis not present

## 2014-03-31 DIAGNOSIS — M545 Low back pain, unspecified: Secondary | ICD-10-CM | POA: Insufficient documentation

## 2014-03-31 DIAGNOSIS — E1149 Type 2 diabetes mellitus with other diabetic neurological complication: Secondary | ICD-10-CM | POA: Insufficient documentation

## 2014-03-31 DIAGNOSIS — M62838 Other muscle spasm: Secondary | ICD-10-CM | POA: Insufficient documentation

## 2014-03-31 DIAGNOSIS — Z888 Allergy status to other drugs, medicaments and biological substances status: Secondary | ICD-10-CM | POA: Diagnosis not present

## 2014-03-31 DIAGNOSIS — M25562 Pain in left knee: Principal | ICD-10-CM

## 2014-03-31 DIAGNOSIS — M25569 Pain in unspecified knee: Secondary | ICD-10-CM | POA: Diagnosis not present

## 2014-03-31 DIAGNOSIS — E1142 Type 2 diabetes mellitus with diabetic polyneuropathy: Secondary | ICD-10-CM | POA: Insufficient documentation

## 2014-03-31 DIAGNOSIS — G8929 Other chronic pain: Secondary | ICD-10-CM

## 2014-03-31 DIAGNOSIS — M25561 Pain in right knee: Principal | ICD-10-CM

## 2014-03-31 DIAGNOSIS — I10 Essential (primary) hypertension: Secondary | ICD-10-CM | POA: Diagnosis not present

## 2014-03-31 DIAGNOSIS — M7541 Impingement syndrome of right shoulder: Secondary | ICD-10-CM

## 2014-03-31 MED ORDER — MORPHINE SULFATE ER 30 MG PO TBCR: 30.0000 mg | EXTENDED_RELEASE_TABLET | Freq: Two times a day (BID) | ORAL | Status: DC

## 2014-03-31 MED ORDER — HYDROCODONE-ACETAMINOPHEN 10-325 MG PO TABS: 1.0000 | ORAL_TABLET | Freq: Three times a day (TID) | ORAL | Status: DC

## 2014-03-31 NOTE — Patient Instructions (Signed)

## 2014-03-31 NOTE — Progress Notes (Signed)
Subjective:    Patient ID: Theresa Huffman, female    DOB: [birth year], 68 y.o.   MRN: [id number] Chief complaint is right shoulder pain HPI  six-month history of bilateral shoulder pain. It seems to have started while lifting her young grandchild. She also uses [place] crutches to ambulate Pain responded to a left shoulder injection done at an orthopedic office approximately 6 weeks ago. Now it is mainly the right shoulder that is painful.  The knee pain is relatively well controlled with morphine extended release 30 mg twice a day as well as Hydrocodone 10 mg 3 times a day  No recent falls, no constipation from pain medications.  Interval medical history as above, no recent hospitalizations or surgeries no recent medication changes Pain Inventory Average Pain 8 Pain Right Now 8 My pain is burning and aching  In the last 24 hours, has pain interfered with the following? General activity 8 Relation with others 6 Enjoyment of life 6 What TIME of day is your pain at its worst? morning and evening Sleep (in general) Fair  Pain is worse with: walking, bending and standing Pain improves with: rest and medication Relief from Meds: 7  Mobility use a cane how many minutes can you walk? 10 ability to climb steps?  no do you drive?  yes  Function disabled: date disabled .  Neuro/Psych weakness trouble walking depression  Prior Studies Any changes since last visit?  no  Physicians involved in your care Any changes since last visit?  no   Family History  Problem Relation Age of Onset  . Cancer Mother     lung and colon  . Stroke Sister    History   Social History  . Marital Status: Married    Spouse Name: N/A    Number of Children: 1  . Years of Education: N/A   Occupational History  . Diability    Social History Main Topics  . Smoking status: Never Smoker   . Smokeless tobacco: Never Used  . Alcohol Use: No  . Drug Use: No  . Sexual Activity: None    Other Topics Concern  . None   Social History Narrative   Lives in [place], one adult child      Caffeine use: coffee daily in am   Past Surgical History  Procedure Laterality Date  . Cholecystectomy    . Tubal ligation     Past Medical History  Diagnosis Date  . Hyperlipidemia   . Hypertension   . Low back pain   . Morbid obesity   . Diabetes mellitus     type II  . Thyroid disease     hypothyroidism  . GERD (gastroesophageal reflux disease)   . Lymphedema   . Lumbosacral spondylosis without myelopathy   . Pain in limb   . Lumbago   . Facet syndrome, lumbar   . Lymphedema   . Primary localized osteoarthrosis, lower leg   . Hypercholesteremia    BP 188/88  P 114  O2 sat 90% WT 388lb  Opioid Risk Score:   Fall Risk Score: Moderate Fall Risk (6-13 points) (educated and handout given for fall prevention in the home)  Review of Systems  Musculoskeletal: Positive for back pain and gait problem.  Neurological: Positive for weakness.  Psychiatric/Behavioral: Positive for dysphoric mood.  All other systems reviewed and are negative.      Objective:   Physical Exam  Nursing note and vitals reviewed. Constitutional: She is oriented to  person, place, and time. She appears well-developed and well-nourished.  Morbid obesity  HENT:  Head: Normocephalic and atraumatic.  Eyes: Conjunctivae and EOM are normal. Pupils are equal, round, and reactive to light.  Musculoskeletal:   Positive Hawkins maneuver on the right side  Mild tenderness to palpation over the right trapezius muscle  Cervical range of motion is normal  Neurological: She is alert and oriented to person, place, and time. She has normal reflexes. A sensory deficit is present.  Reduced sensation to pinprick in the toes of both feet  Limited hip flexor active range of motion related to panniculus  Knee extensor ankle dorsiflexor and great toe extensor strength are normal Upper extremity strength is  normal bilateral  Psychiatric: She has a normal mood and affect.          Assessment & Plan:  1. Right subacromial impingement syndrome, pain is not responsive to analgesic medications. Interferes with activities overhead. Has had good relief with left subacromial injection, will do right subacromial injection today  Shoulder injection Right subacromial   Indication:Right Shoulder pain not relieved by medication management and other conservative care.  Informed consent was obtained after describing risks and benefits of the procedure with the patient, this includes bleeding, bruising, infection and medication side effects. The patient wishes to proceed and has given written consent. Patient was placed in a seated position. The Right shoulder was marked and prepped with betadine in the subacromial area. A 25-gauge 1-1/2 inch needle was inserted into the subacromial area. After negative draw back for blood, a solution containing 1 mL of 6 mg per ML betamethasone and 4 mL of 1% lidocaine was injected. A band aid was applied. The patient tolerated the procedure well. Post procedure instructions were given.  2. Chronic knee pain bilateral, stable overall controlled with MS Contin 30mg  twice a day and hydrocodone 10 mg 3 times per day. No side effects from medications No sign of medication misuse  Nurse practitioner visit one month M.D. visit in 6 months or sooner if repeat injection as needed

## 2014-04-13 ENCOUNTER — Other Ambulatory Visit: Payer: Self-pay | Admitting: Family Medicine

## 2014-04-20 ENCOUNTER — Other Ambulatory Visit: Payer: Self-pay | Admitting: Family Medicine

## 2014-04-30 ENCOUNTER — Encounter: Payer: Self-pay | Admitting: Registered Nurse

## 2014-04-30 ENCOUNTER — Encounter: Payer: Medicare Other | Attending: Physical Medicine & Rehabilitation | Admitting: Registered Nurse

## 2014-04-30 VITALS — BP 170/75 | HR 111 | Resp 16 | Wt 388.0 lb

## 2014-04-30 DIAGNOSIS — M62838 Other muscle spasm: Secondary | ICD-10-CM | POA: Insufficient documentation

## 2014-04-30 DIAGNOSIS — M25561 Pain in right knee: Secondary | ICD-10-CM

## 2014-04-30 DIAGNOSIS — IMO0002 Reserved for concepts with insufficient information to code with codable children: Secondary | ICD-10-CM | POA: Diagnosis not present

## 2014-04-30 DIAGNOSIS — M545 Low back pain, unspecified: Secondary | ICD-10-CM | POA: Diagnosis not present

## 2014-04-30 DIAGNOSIS — E1142 Type 2 diabetes mellitus with diabetic polyneuropathy: Secondary | ICD-10-CM | POA: Insufficient documentation

## 2014-04-30 DIAGNOSIS — M7541 Impingement syndrome of right shoulder: Secondary | ICD-10-CM

## 2014-04-30 DIAGNOSIS — E1149 Type 2 diabetes mellitus with other diabetic neurological complication: Secondary | ICD-10-CM | POA: Insufficient documentation

## 2014-04-30 DIAGNOSIS — G8929 Other chronic pain: Secondary | ICD-10-CM | POA: Insufficient documentation

## 2014-04-30 DIAGNOSIS — Z5181 Encounter for therapeutic drug level monitoring: Secondary | ICD-10-CM

## 2014-04-30 DIAGNOSIS — M75100 Unspecified rotator cuff tear or rupture of unspecified shoulder, not specified as traumatic: Secondary | ICD-10-CM

## 2014-04-30 DIAGNOSIS — M25562 Pain in left knee: Secondary | ICD-10-CM

## 2014-04-30 DIAGNOSIS — M25569 Pain in unspecified knee: Secondary | ICD-10-CM | POA: Diagnosis not present

## 2014-04-30 DIAGNOSIS — I10 Essential (primary) hypertension: Secondary | ICD-10-CM | POA: Diagnosis not present

## 2014-04-30 DIAGNOSIS — Z79899 Other long term (current) drug therapy: Secondary | ICD-10-CM | POA: Insufficient documentation

## 2014-04-30 MED ORDER — HYDROCODONE-ACETAMINOPHEN 10-325 MG PO TABS
1.0000 | ORAL_TABLET | Freq: Three times a day (TID) | ORAL | Status: DC
Start: 2014-04-30 — End: 2014-06-02

## 2014-04-30 MED ORDER — MORPHINE SULFATE ER 30 MG PO TBCR: 30.0000 mg | EXTENDED_RELEASE_TABLET | Freq: Two times a day (BID) | ORAL | Status: DC

## 2014-04-30 NOTE — Progress Notes (Signed)
Subjective:    Patient ID: Theresa Huffman, female    DOB: 08-Nov-1945, 68 y.o.   MRN: [id number]  HPI: Theresa Huffman is a 68 year old female who returns for follow up for chronic pain and medication refill. She says her pain is in her lower back, bilateral shoulders and bilateral knees.She rates her pain 8. Her current exercise regime is performing stretching exercises occasionally, she has been encouraged to start performing chair exercises. The exercises have been demonstrated to her with verbalization of understanding.  She arrived to office hypertensive 171/95, blood pressure rechecked 167/84 she says she is compliant with her antihypertensive medication. She has an automatic blood pressure apparatus she will start checking her blood pressure frequently through out the day and will follow up with her PMD.  Pain Inventory  Pain Inventory Average Pain 8 Pain Right Now 8 My pain is burning and aching  In the last 24 hours, has pain interfered with the following? General activity 9 Relation with others 5 Enjoyment of life 8 What TIME of day is your pain at its worst? na Sleep (in general) Poor  Pain is worse with: walking, bending, sitting and standing Pain improves with: rest Relief from Meds: 6  Mobility walk with assistance how many minutes can you walk? 10 ability to climb steps?  no do you drive?  yes use a wheelchair Do you have any goals in this area?  yes  Function disabled: date disabled na Do you have any goals in this area?  yes  Neuro/Psych weakness trouble walking depression  Prior Studies Any changes since last visit?  no  Physicians involved in your care Any changes since last visit?  no   Family History  Problem Relation Age of Onset  . Cancer Mother     lung and colon  . Stroke Sister    History   Social History  . Marital Status: Married    Spouse Name: N/A    Number of Children: 1  . Years of Education: N/A    Occupational History  . Diability    Social History Main Topics  . Smoking status: Never Smoker   . Smokeless tobacco: Never Used  . Alcohol Use: No  . Drug Use: No  . Sexual Activity: None   Other Topics Concern  . None   Social History Narrative   Lives in [place], one adult child      Caffeine use: coffee daily in am   Past Surgical History  Procedure Laterality Date  . Cholecystectomy    . Tubal ligation     Past Medical History  Diagnosis Date  . Hyperlipidemia   . Hypertension   . Low back pain   . Morbid obesity   . Diabetes mellitus     type II  . Thyroid disease     hypothyroidism  . GERD (gastroesophageal reflux disease)   . Lymphedema   . Lumbosacral spondylosis without myelopathy   . Pain in limb   . Lumbago   . Facet syndrome, lumbar   . Lymphedema   . Primary localized osteoarthrosis, lower leg   . Hypercholesteremia    BP 170/75  Pulse 111  Resp 16  Wt 388 lb (175.996 kg)  SpO2 86%  Opioid Risk Score:   Fall Risk Score: Moderate Fall Risk (6-13 points) (pt educated on fall risk, brochure given to pt previously)    Review of Systems  Musculoskeletal: Positive for back pain and gait problem.  Neurological: Positive for weakness.  Psychiatric/Behavioral:       Depression  All other systems reviewed and are negative.      Objective:   Physical Exam  Nursing note and vitals reviewed. Constitutional: She is oriented to person, place, and time. She appears well-developed and well-nourished.  Morbid Obese  HENT:  Head: Normocephalic and atraumatic.  Neck: Normal range of motion. Neck supple.  Cardiovascular: Normal rate, regular rhythm and normal heart sounds.   Musculoskeletal:  Normal Muscle Bulk and Muscle Testing Reveals: Upper Extremities: Full ROM and Muscle Strength 5/5 Lumbar Paraspinal Tenderness: L-3- L-5 Lower Extremities: Full ROM and Muscle Strength 5/5 Right Leg Flexion Produces Pain into Patella and lateral  joint line. No swelling or Tenderness Noted. Trace edema to Lower Extremities. Uses [place] Crutches Arises from chair with ease Wide Based Gait  Neurological: She is alert and oriented to person, place, and time.  Skin: Skin is warm and dry.  Psychiatric: She has a normal mood and affect.          Assessment & Plan:  1. Lumbar pain chronic, no radiculopathy: Lumbar degenerative disc L4-5 and L5-S1.  Refilled: HYDRcodone 10/325mg  one tablet TID #90.  MS CONTIN 30 mg one evry 12 hours #60.  2. Bilateral knee osteoarthritis: Continue Robaxin. Use Heat therapy. Continue to current medication regime.   20 minutes of face to face patient care time was spent during this visit. All questions were encouraged and answered.   F/U in 1 month

## 2014-05-10 ENCOUNTER — Other Ambulatory Visit: Payer: Self-pay | Admitting: Family Medicine

## 2014-05-13 ENCOUNTER — Telehealth: Payer: Self-pay | Admitting: Family Medicine

## 2014-05-13 NOTE — Telephone Encounter (Signed)
Diabetic Bundle.  Pt needs follow up appt to check BP.  Left message with husband for pt to return call.

## 2014-06-02 ENCOUNTER — Encounter: Payer: Self-pay | Admitting: Registered Nurse

## 2014-06-02 ENCOUNTER — Other Ambulatory Visit: Payer: Self-pay

## 2014-06-02 ENCOUNTER — Encounter: Payer: Medicare Other | Attending: Physical Medicine & Rehabilitation | Admitting: Registered Nurse

## 2014-06-02 VITALS — BP 156/68 | HR 95 | Resp 14 | Wt 388.0 lb

## 2014-06-02 DIAGNOSIS — M62838 Other muscle spasm: Secondary | ICD-10-CM | POA: Diagnosis not present

## 2014-06-02 DIAGNOSIS — M545 Low back pain, unspecified: Secondary | ICD-10-CM | POA: Insufficient documentation

## 2014-06-02 DIAGNOSIS — M25569 Pain in unspecified knee: Secondary | ICD-10-CM | POA: Diagnosis not present

## 2014-06-02 DIAGNOSIS — M7541 Impingement syndrome of right shoulder: Secondary | ICD-10-CM

## 2014-06-02 DIAGNOSIS — M25562 Pain in left knee: Secondary | ICD-10-CM

## 2014-06-02 DIAGNOSIS — Z79899 Other long term (current) drug therapy: Secondary | ICD-10-CM | POA: Insufficient documentation

## 2014-06-02 DIAGNOSIS — M25561 Pain in right knee: Secondary | ICD-10-CM

## 2014-06-02 DIAGNOSIS — G8929 Other chronic pain: Secondary | ICD-10-CM | POA: Diagnosis not present

## 2014-06-02 DIAGNOSIS — M75100 Unspecified rotator cuff tear or rupture of unspecified shoulder, not specified as traumatic: Secondary | ICD-10-CM | POA: Diagnosis not present

## 2014-06-02 DIAGNOSIS — I10 Essential (primary) hypertension: Secondary | ICD-10-CM | POA: Insufficient documentation

## 2014-06-02 DIAGNOSIS — E1142 Type 2 diabetes mellitus with diabetic polyneuropathy: Secondary | ICD-10-CM | POA: Diagnosis not present

## 2014-06-02 DIAGNOSIS — Z5181 Encounter for therapeutic drug level monitoring: Secondary | ICD-10-CM | POA: Diagnosis not present

## 2014-06-02 DIAGNOSIS — IMO0002 Reserved for concepts with insufficient information to code with codable children: Secondary | ICD-10-CM | POA: Diagnosis not present

## 2014-06-02 DIAGNOSIS — E1149 Type 2 diabetes mellitus with other diabetic neurological complication: Secondary | ICD-10-CM | POA: Diagnosis not present

## 2014-06-02 MED ORDER — MORPHINE SULFATE ER 30 MG PO TBCR: 30.0000 mg | EXTENDED_RELEASE_TABLET | Freq: Two times a day (BID) | ORAL | Status: DC

## 2014-06-02 MED ORDER — HYDROCODONE-ACETAMINOPHEN 10-325 MG PO TABS: 1.0000 | ORAL_TABLET | Freq: Three times a day (TID) | ORAL | Status: DC

## 2014-06-02 MED ORDER — IBUPROFEN 800 MG PO TABS
800.0000 mg | ORAL_TABLET | Freq: Three times a day (TID) | ORAL | Status: DC
Start: 2014-06-02 — End: 2014-09-29

## 2014-06-02 NOTE — Progress Notes (Signed)
Subjective:    Patient ID: [name], female    DOB: 1946/01/24, 68 y.o.   MRN: [id number]  HPI: [name] is a 68 year old female who returns for follow up for chronic pain and medication refill. She says her pain is in her lower back, and bilateral knees.She rates her pain 8. Her current exercise regime is performing stretching exercises daily.  Pain Inventory Average Pain 8 Pain Right Now 8 My pain is burning and aching  In the last 24 hours, has pain interfered with the following? General activity 8 Relation with others 6 Enjoyment of life 7 What TIME of day is your pain at its worst? morning, evening Sleep (in general) Poor  Pain is worse with: walking, bending and standing Pain improves with: rest and medication Relief from Meds: 6  Mobility walk with assistance ability to climb steps?  no do you drive?  yes use a wheelchair transfers alone Do you have any goals in this area?  yes  Function disabled: date disabled na Do you have any goals in this area?  no  Neuro/Psych weakness trouble walking depression  Prior Studies Any changes since last visit?  no  Physicians involved in your care Any changes since last visit?  no   Family History  Problem Relation Age of Onset  . Cancer Mother     lung and colon  . Stroke Sister    History   Social History  . Marital Status: Married    Spouse Name: N/A    Number of Children: 1  . Years of Education: N/A   Occupational History  . Diability    Social History Main Topics  . Smoking status: Never Smoker   . Smokeless tobacco: Never Used  . Alcohol Use: No  . Drug Use: No  . Sexual Activity: None   Other Topics Concern  . None   Social History Narrative   Lives in [place], one adult child      Caffeine use: coffee daily in am   Past Surgical History  Procedure Laterality Date  . Cholecystectomy    . Tubal ligation     Past Medical History  Diagnosis Date  .  Hyperlipidemia   . Hypertension   . Low back pain   . Morbid obesity   . Diabetes mellitus     type II  . Thyroid disease     hypothyroidism  . GERD (gastroesophageal reflux disease)   . Lymphedema   . Lumbosacral spondylosis without myelopathy   . Pain in limb   . Lumbago   . Facet syndrome, lumbar   . Lymphedema   . Primary localized osteoarthrosis, lower leg   . Hypercholesteremia    BP 156/68  Pulse 95  Resp 14  Wt 388 lb (175.996 kg)  SpO2 88%  Opioid Risk Score:   Fall Risk Score: Moderate Fall Risk (6-13 points) (pt educated on fall risk, brochure given to pt previously)    Review of Systems  Musculoskeletal: Positive for back pain and gait problem.  Neurological: Positive for weakness.  Psychiatric/Behavioral:       Depression  All other systems reviewed and are negative.      Objective:   Physical Exam  Nursing note and vitals reviewed. Constitutional: She is oriented to person, place, and time. She appears well-developed and well-nourished.  Obesity  HENT:  Head: Normocephalic and atraumatic.  Neck: Normal range of motion. Neck supple.  Cervical Paraspinal Tenderness: C-3-  C-5  Cardiovascular: Normal rate, regular rhythm and normal heart sounds.   Pulmonary/Chest: Effort normal and breath sounds normal.  Musculoskeletal:  Normal Muscle Bulk and Muscle Testing Reveals: Upper Extremities: Full ROM and Muscle Strength 5/5 Spinal Forward Flexion: 30 Degree, Unable to Perform Extension she says. Lumbar Paraspinal Tenderness: L-3- L-5 Lower Extremities: Full ROM and Muscle Strength 5/5 Left Leg Flexion Produces Pain into Patella Uses [place] Crutches Arises from chair with ease Wide Based gait      Neurological: She is alert and oriented to person, place, and time.  Skin: Skin is warm and dry.  Psychiatric: She has a normal mood and affect.          Assessment & Plan:  1. Lumbar pain chronic, no radiculopathy: Lumbar degenerative disc L4-5  and L5-S1.  Refilled: HYDRcodone 10/325mg  one tablet TID #90.  MS CONTIN 30 mg one every 12 hours #60.  2. Bilateral knee osteoarthritis: Continue Robaxin. Use Heat therapy. Continue to current medication regime.   20 minutes of face to face patient care time was spent during this visit. All questions were encouraged and answered.   F/U in 1 month

## 2014-06-07 ENCOUNTER — Other Ambulatory Visit: Payer: Self-pay | Admitting: Family Medicine

## 2014-06-12 ENCOUNTER — Encounter: Payer: Self-pay | Admitting: Family Medicine

## 2014-06-12 ENCOUNTER — Ambulatory Visit (INDEPENDENT_AMBULATORY_CARE_PROVIDER_SITE_OTHER): Payer: Medicare Other | Admitting: Family Medicine

## 2014-06-12 VITALS — BP 152/88 | HR 130 | Temp 98.0°F | Wt 388.8 lb

## 2014-06-12 DIAGNOSIS — E1149 Type 2 diabetes mellitus with other diabetic neurological complication: Secondary | ICD-10-CM | POA: Diagnosis not present

## 2014-06-12 DIAGNOSIS — E785 Hyperlipidemia, unspecified: Secondary | ICD-10-CM

## 2014-06-12 DIAGNOSIS — I10 Essential (primary) hypertension: Secondary | ICD-10-CM | POA: Diagnosis not present

## 2014-06-12 LAB — HEMOGLOBIN A1C: Hgb A1c MFr Bld: 8.6 % — ABNORMAL HIGH (ref 4.6–6.5)

## 2014-06-12 NOTE — Progress Notes (Signed)
Pre visit review using our clinic review tool, if applicable. No additional management support is needed unless otherwise documented below in the visit note. 

## 2014-06-12 NOTE — Assessment & Plan Note (Signed)
At goal. Continue zocor 20 mg daily.

## 2014-06-12 NOTE — Progress Notes (Signed)
Subjective:    Patient ID: Theresa Huffman, female    DOB: 12-13-1945, 68 y.o.   MRN: [id number]  HPI  68 yo with h/o morbid obesity, poorly controlled DM, HLD, HTN here for med refills.   DM- poorly controlled so I referred her to endocrinology.  Last saw Dr. Cruzita Lederer on 02/26/14- note reviewed.  Advised cont Metformin, glipizide and Invokana too mg.  She was asked to follow up in 3 months with Dr. Cruzita Lederer. Lab Results  Component Value Date   HGBA1C 7.8* 02/13/2014   Checking FSBS every morning- feels its been higher because she is eating cereal at night. This morning it was 130. +urine microalbumin on 02/13/14.  LDL at goal- takes zocor 20 mg daily. Lab Results  Component Value Date   CHOL 161 05/27/2013   HDL 44.60 05/27/2013   LDLDIRECT 81.4 05/27/2013   TRIG 258.0* 05/27/2013   CHOLHDL 4 05/27/2013     Patient Active Problem List   Diagnosis Date Noted  . Subacromial impingement of right shoulder 03/31/2014  . Cough 02/13/2014  . Degeneration of lumbar or lumbosacral intervertebral disc 12/30/2013  . Rotator cuff impingement syndrome of left shoulder 12/03/2013  . Exertional dyspnea 08/19/2012  . Tachycardia 08/15/2012  . Bilateral chronic knee pain 01/11/2012  . Right hip pain 01/11/2012  . Burning sensation of feet 01/11/2012  . LYMPHEDEMA, SEVERE 09/01/2010  . HYPOTHYROIDISM 01/25/2010  . Type II or unspecified type diabetes mellitus with neurological manifestations, uncontrolled 01/25/2010  . UNSPECIFIED VITAMIN D DEFICIENCY 01/25/2010  . HYPERLIPIDEMIA 01/25/2010  . OBESITY, MORBID 01/25/2010  . HYPERTENSION 01/25/2010  . UNSPECIFIED VENOUS INSUFFICIENCY 01/25/2010  . GERD 01/25/2010  . Lumbago 01/25/2010   Past Medical History  Diagnosis Date  . Hyperlipidemia   . Hypertension   . Low back pain   . Morbid obesity   . Diabetes mellitus     type II  . Thyroid disease     hypothyroidism  . GERD (gastroesophageal reflux disease)   . Lymphedema   .  Lumbosacral spondylosis without myelopathy   . Pain in limb   . Lumbago   . Facet syndrome, lumbar   . Lymphedema   . Primary localized osteoarthrosis, lower leg   . Hypercholesteremia    Past Surgical History  Procedure Laterality Date  . Cholecystectomy    . Tubal ligation     History  Substance Use Topics  . Smoking status: Never Smoker   . Smokeless tobacco: Never Used  . Alcohol Use: No   Family History  Problem Relation Age of Onset  . Cancer Mother     lung and colon  . Stroke Sister    Allergies  Allergen Reactions  . Ace Inhibitors     Angioedema   . Erythromycin     REACTION: Nausea and vomiting   Current Outpatient Prescriptions on File Prior to Visit  Medication Sig Dispense Refill  . albuterol (PROAIR HFA) 108 (90 BASE) MCG/ACT inhaler Inhale 2 puffs into the lungs every 4 (four) hours as needed.  1 Inhaler  1  . amLODipine (NORVASC) 5 MG tablet One tablet by mouth daily. * Needs office visit for refills*  90 tablet  0  . aspirin EC 81 MG tablet Take 162 mg by mouth daily.      . Bromocriptine Mesylate 0.8 MG TABS Take 2 tablets po first thing in am daily  180 tablet  3  . Calcium Carbonate-Vitamin D 600-400 MG-UNIT per tablet Take 1 tablet by  mouth daily. 1200mg  once daily      . furosemide (LASIX) 40 MG tablet Take 40 mg by mouth daily.      Marland Kitchen gabapentin (NEURONTIN) 300 MG capsule Take two capsule by mouth three times daily  180 capsule  5  . glipiZIDE (GLUCOTROL) 5 MG tablet TAKE ONE (1) TABLET BY MOUTH TWICE DAILYBEFORE MEALS  60 tablet  2  . HYDROcodone-acetaminophen (NORCO) 10-325 MG per tablet Take 1 tablet by mouth 3 (three) times daily. As needed for back or leg pain  90 tablet  0  . ibuprofen (ADVIL,MOTRIN) 800 MG tablet Take 1 tablet (800 mg total) by mouth 3 (three) times daily.  90 tablet  2  . metFORMIN (GLUCOPHAGE) 1000 MG tablet TAKE 1 TABLET BY MOUTH TWICE A DAY  60 tablet  2  . methocarbamol (ROBAXIN) 500 MG tablet Take 1 tablet (500 mg  total) by mouth 3 (three) times daily.  90 tablet  3  . moexipril (UNIVASC) 15 MG tablet Take 15 mg by mouth daily.       Marland Kitchen morphine (MS CONTIN) 30 MG 12 hr tablet Take 1 tablet (30 mg total) by mouth every 12 (twelve) hours.  60 tablet  0  . potassium chloride SA (K-DUR,KLOR-CON) 20 MEQ tablet TAKE 1 TABLET BY MOUTH DAILY  30 tablet  5  . simvastatin (ZOCOR) 20 MG tablet TAKE 1 TABLET BY MOUTH DAILY  30 tablet  2  . SYNTHROID 175 MCG tablet TAKE 1 TABLET BY MOUTH DAILY  60 tablet  1  . venlafaxine (EFFEXOR) 75 MG tablet Take three tablets by mouth every night at bedtime  90 tablet  3   No current facility-administered medications on file prior to visit.   The PMH, PSH, Social History, Family History, Medications, and allergies have been reviewed in [facility], and have been updated if relevant.   Review of Systems Denies any CP, SOB or LE edema. No episodes of hypoglycemia No increased thirst or urination No CP, palpitations No blurred vision No HE    Objective:   Physical Exam BP 152/88  Pulse 130  Temp(Src) 98 F (36.7 C) (Oral)  Wt 388 lb 12 oz (176.336 kg)  SpO2 86% Gen:  Alert, morbidly obese, NAD HEENT:  MMM Resp: CTA bilaterally CVS:  RRR Neuro:  Walks with cane      Assessment & Plan:  .

## 2014-06-12 NOTE — Assessment & Plan Note (Signed)
Not at goal and likely deteriorated. Recheck a1c today. Advised making f/u with Dr. Cruzita Lederer per her advise. The patient indicates understanding of these issues and agrees with the plan.

## 2014-06-12 NOTE — Assessment & Plan Note (Signed)
Deteriorated and not at goal for a diabetic. She is normotensive at home, per pt.  She feels due to her weight and deconditioning, her BP increases when she walks or exerts herself in anyway. No changes made as I do not want her to become hypotensive. She will keep log of BP at home.

## 2014-06-12 NOTE — Patient Instructions (Signed)
Please make an appt with Dr. Cruzita Lederer. I will call you with your a1c results.

## 2014-06-13 ENCOUNTER — Telehealth: Payer: Self-pay | Admitting: Family Medicine

## 2014-06-13 NOTE — Telephone Encounter (Signed)
Relevant patient education assigned to patient using Emmi. ° °

## 2014-06-23 ENCOUNTER — Encounter: Payer: Self-pay | Admitting: Physical Medicine & Rehabilitation

## 2014-06-23 ENCOUNTER — Encounter: Payer: Medicare Other | Attending: Physical Medicine and Rehabilitation

## 2014-06-23 ENCOUNTER — Ambulatory Visit (HOSPITAL_BASED_OUTPATIENT_CLINIC_OR_DEPARTMENT_OTHER): Payer: Medicare Other | Admitting: Physical Medicine & Rehabilitation

## 2014-06-23 VITALS — BP 167/80 | HR 135 | Resp 16 | Ht 63.0 in | Wt 399.0 lb

## 2014-06-23 DIAGNOSIS — M7542 Impingement syndrome of left shoulder: Secondary | ICD-10-CM

## 2014-06-23 DIAGNOSIS — G8929 Other chronic pain: Secondary | ICD-10-CM | POA: Diagnosis not present

## 2014-06-23 DIAGNOSIS — E1142 Type 2 diabetes mellitus with diabetic polyneuropathy: Secondary | ICD-10-CM | POA: Diagnosis not present

## 2014-06-23 DIAGNOSIS — M5137 Other intervertebral disc degeneration, lumbosacral region: Secondary | ICD-10-CM

## 2014-06-23 DIAGNOSIS — M67919 Unspecified disorder of synovium and tendon, unspecified shoulder: Secondary | ICD-10-CM

## 2014-06-23 DIAGNOSIS — IMO0002 Reserved for concepts with insufficient information to code with codable children: Secondary | ICD-10-CM

## 2014-06-23 DIAGNOSIS — M545 Low back pain, unspecified: Secondary | ICD-10-CM | POA: Diagnosis not present

## 2014-06-23 DIAGNOSIS — E1149 Type 2 diabetes mellitus with other diabetic neurological complication: Secondary | ICD-10-CM | POA: Diagnosis not present

## 2014-06-23 DIAGNOSIS — M62838 Other muscle spasm: Secondary | ICD-10-CM | POA: Diagnosis not present

## 2014-06-23 DIAGNOSIS — M25561 Pain in right knee: Principal | ICD-10-CM

## 2014-06-23 DIAGNOSIS — M719 Bursopathy, unspecified: Secondary | ICD-10-CM

## 2014-06-23 DIAGNOSIS — I10 Essential (primary) hypertension: Secondary | ICD-10-CM | POA: Diagnosis not present

## 2014-06-23 DIAGNOSIS — M25569 Pain in unspecified knee: Secondary | ICD-10-CM | POA: Diagnosis not present

## 2014-06-23 DIAGNOSIS — Z888 Allergy status to other drugs, medicaments and biological substances status: Secondary | ICD-10-CM | POA: Diagnosis not present

## 2014-06-23 DIAGNOSIS — M75100 Unspecified rotator cuff tear or rupture of unspecified shoulder, not specified as traumatic: Secondary | ICD-10-CM | POA: Diagnosis not present

## 2014-06-23 DIAGNOSIS — M25562 Pain in left knee: Principal | ICD-10-CM

## 2014-06-23 DIAGNOSIS — M7541 Impingement syndrome of right shoulder: Secondary | ICD-10-CM

## 2014-06-23 NOTE — Progress Notes (Signed)
Chief complaint, assessment for power wheelchair  Shoulder pain rated 7/10 Back pain rated 8/10 Knee pain rated 8/10  Please refer to vitals sheet for height and weight  Independent with toileting and dressing no problems with feeding needs assist for bathing  No falls at home. Has decreased balance  No cognitive dysfunction denies any minimal memory loss.  Patient has been using a power wheelchair for 8 years in the home environment to help with mobility getting room to room. Cannot use a scooter because she cannot set safely transfer in and out.  Please refer to vitals sheet.  Heart regular rate and rhythm Lungs clear to auscultation Abdomen soft nontender palpation Motor strength is 4/5 bilateral deltoid, bicep, tricep, grip, hip flexor, knee extensors, ankle dorsiflexor and flexor Range of motion is full in bilateral upper extremities Range of motion is reduced bilateral lower extremities with hip flexion knee extension Hip abduction and hip abduction.  Unable to go from sit to stand without assistive device Able to stand without assistive device increased base of support.   Impression #1 Chronic low back pain degenerative disc disease 2. Chronic end-stage osteoarthritis of the knees, ambulation limited, has been wheelchair dependent for 8 years, very limited distance for [place] crutches 3. Rotator cuff syndrome bilateral this limits her upper extremity use and repetitive fashion for activities such as propelling manual wheelchair  Patient is mentally and physically able to operate a power wheelchair safely in home Patient is willing and motivated to use a power wheelchair

## 2014-06-29 ENCOUNTER — Other Ambulatory Visit: Payer: Self-pay | Admitting: Family Medicine

## 2014-07-03 ENCOUNTER — Encounter: Payer: Medicare Other | Attending: Physical Medicine & Rehabilitation | Admitting: Registered Nurse

## 2014-07-03 ENCOUNTER — Encounter: Payer: Self-pay | Admitting: Registered Nurse

## 2014-07-03 VITALS — BP 153/62 | HR 98 | Resp 18 | Ht 63.0 in | Wt 394.0 lb

## 2014-07-03 DIAGNOSIS — M5137 Other intervertebral disc degeneration, lumbosacral region: Secondary | ICD-10-CM | POA: Diagnosis not present

## 2014-07-03 DIAGNOSIS — Z79899 Other long term (current) drug therapy: Secondary | ICD-10-CM

## 2014-07-03 DIAGNOSIS — E1149 Type 2 diabetes mellitus with other diabetic neurological complication: Secondary | ICD-10-CM | POA: Insufficient documentation

## 2014-07-03 DIAGNOSIS — M67919 Unspecified disorder of synovium and tendon, unspecified shoulder: Secondary | ICD-10-CM

## 2014-07-03 DIAGNOSIS — M545 Low back pain, unspecified: Secondary | ICD-10-CM | POA: Diagnosis not present

## 2014-07-03 DIAGNOSIS — M7542 Impingement syndrome of left shoulder: Secondary | ICD-10-CM

## 2014-07-03 DIAGNOSIS — IMO0002 Reserved for concepts with insufficient information to code with codable children: Secondary | ICD-10-CM

## 2014-07-03 DIAGNOSIS — E1142 Type 2 diabetes mellitus with diabetic polyneuropathy: Secondary | ICD-10-CM | POA: Diagnosis not present

## 2014-07-03 DIAGNOSIS — Z5181 Encounter for therapeutic drug level monitoring: Secondary | ICD-10-CM

## 2014-07-03 DIAGNOSIS — M62838 Other muscle spasm: Secondary | ICD-10-CM | POA: Diagnosis not present

## 2014-07-03 DIAGNOSIS — M25562 Pain in left knee: Secondary | ICD-10-CM

## 2014-07-03 DIAGNOSIS — G8929 Other chronic pain: Secondary | ICD-10-CM

## 2014-07-03 DIAGNOSIS — M25569 Pain in unspecified knee: Secondary | ICD-10-CM

## 2014-07-03 DIAGNOSIS — M25561 Pain in right knee: Secondary | ICD-10-CM

## 2014-07-03 DIAGNOSIS — M7541 Impingement syndrome of right shoulder: Secondary | ICD-10-CM

## 2014-07-03 DIAGNOSIS — M51379 Other intervertebral disc degeneration, lumbosacral region without mention of lumbar back pain or lower extremity pain: Secondary | ICD-10-CM

## 2014-07-03 DIAGNOSIS — M75100 Unspecified rotator cuff tear or rupture of unspecified shoulder, not specified as traumatic: Secondary | ICD-10-CM

## 2014-07-03 DIAGNOSIS — I10 Essential (primary) hypertension: Secondary | ICD-10-CM | POA: Insufficient documentation

## 2014-07-03 DIAGNOSIS — M719 Bursopathy, unspecified: Secondary | ICD-10-CM | POA: Diagnosis not present

## 2014-07-03 MED ORDER — MORPHINE SULFATE ER 30 MG PO TBCR: 30.0000 mg | EXTENDED_RELEASE_TABLET | Freq: Two times a day (BID) | ORAL | Status: DC

## 2014-07-03 MED ORDER — HYDROCODONE-ACETAMINOPHEN 10-325 MG PO TABS: 1.0000 | ORAL_TABLET | Freq: Three times a day (TID) | ORAL | Status: DC

## 2014-07-03 NOTE — Progress Notes (Signed)
Subjective:    Patient ID: Theresa Huffman, female    DOB: 1946-08-26, 68 y.o.   MRN: [id number]  HPI: [name] is a 68 year old female who returns for follow up for chronic pain and medication refill. Theresa Huffman says her pain is in her lower back, and bilateral knees right greater than left. Bilateral shoulders at times. Theresa Huffman rates her pain 8. Her current exercise regime is walking. Encourage to increase activity level and exercise.    Pain Inventory Average Pain 8 Pain Right Now 8 My pain is burning and aching  In the last 24 hours, has pain interfered with the following? General activity 8 Relation with others 7 Enjoyment of life 8 What TIME of day is your pain at its worst? morning, daytime Sleep (in general) Fair  Pain is worse with: walking, bending, standing and some activites Pain improves with: rest and medication Relief from Meds: 6  Mobility walk with assistance how many minutes can you walk? 10 ability to climb steps?  no do you drive?  yes Do you have any goals in this area?  yes  Function disabled: date disabled na I need assistance with the following:  meal prep, household duties and shopping Do you have any goals in this area?  yes  Neuro/Psych weakness trouble walking depression  Prior Studies Any changes since last visit?  no  Physicians involved in your care Any changes since last visit?  no   Family History  Problem Relation Age of Onset  . Cancer Mother     lung and colon  . Stroke Sister    History   Social History  . Marital Status: Married    Spouse Name: N/A    Number of Children: 1  . Years of Education: N/A   Occupational History  . Diability    Social History Main Topics  . Smoking status: Never Smoker   . Smokeless tobacco: Never Used  . Alcohol Use: No  . Drug Use: No  . Sexual Activity: None   Other Topics Concern  . None   Social History Narrative   Lives in [place], one adult child      Caffeine use: coffee daily in am   Past Surgical History  Procedure Laterality Date  . Cholecystectomy    . Tubal ligation     Past Medical History  Diagnosis Date  . Hyperlipidemia   . Hypertension   . Low back pain   . Morbid obesity   . Diabetes mellitus     type II  . Thyroid disease     hypothyroidism  . GERD (gastroesophageal reflux disease)   . Lymphedema   . Lumbosacral spondylosis without myelopathy   . Pain in limb   . Lumbago   . Facet syndrome, lumbar   . Lymphedema   . Primary localized osteoarthrosis, lower leg   . Hypercholesteremia    BP 153/62  Pulse 98  Resp 18  Ht 5\' 3"  (1.6 m)  Wt 394 lb (178.717 kg)  BMI 69.81 kg/m2  SpO2 88%  Opioid Risk Score:   Fall Risk Score:      Review of Systems  Musculoskeletal: Positive for back pain and gait problem.  Psychiatric/Behavioral:       Depression  All other systems reviewed and are negative.      Objective:   Physical Exam  Nursing note and vitals reviewed. Constitutional: Theresa Huffman is oriented to person, place, and time. Theresa Huffman appears well-developed and  well-nourished.  Morbid Obesity  HENT:  Head: Normocephalic and atraumatic.  Neck: Normal range of motion. Neck supple.  Cardiovascular: Normal rate and regular rhythm.   Pulmonary/Chest: Effort normal and breath sounds normal.  Musculoskeletal:  Normal Muscle Bulk and Muscle testing Reveals: Upper Extremities: Full ROM and Muscle Strength on the Right 5/5 and left 4/5. Thoracic Paraspinal Tenderness Mainly on the Right Side. T-10 - T-12 Lower Extremities: Full ROM and Muscle Strength 5/5 Arises from chair with ease Loftstrand Crutches Wide Based gait  Neurological: Theresa Huffman is alert and oriented to person, place, and time.  Skin: Skin is warm and dry.  Psychiatric: Theresa Huffman has a normal mood and affect.          Assessment & Plan:  1. Lumbar pain chronic, no radiculopathy: Lumbar degenerative disc L4-5 and L5-S1.  Refilled: HYDRcodone 10/325mg   one tablet TID #90.  MS CONTIN 30 mg one every 12 hours #60.  2. Bilateral knee osteoarthritis: Continue Robaxin. Use Heat therapy. Continue to current medication regime.   20 minutes of face to face patient care time was spent during this visit. All questions were encouraged and answered.   F/U in 1 month

## 2014-07-15 ENCOUNTER — Other Ambulatory Visit: Payer: Self-pay | Admitting: Family Medicine

## 2014-07-31 ENCOUNTER — Ambulatory Visit (INDEPENDENT_AMBULATORY_CARE_PROVIDER_SITE_OTHER): Payer: Medicare Other | Admitting: Internal Medicine

## 2014-07-31 ENCOUNTER — Other Ambulatory Visit: Payer: Self-pay | Admitting: *Deleted

## 2014-07-31 ENCOUNTER — Encounter: Payer: Self-pay | Admitting: Internal Medicine

## 2014-07-31 VITALS — BP 122/64 | HR 113 | Temp 97.9°F | Resp 16 | Wt 383.0 lb

## 2014-07-31 DIAGNOSIS — IMO0002 Reserved for concepts with insufficient information to code with codable children: Secondary | ICD-10-CM

## 2014-07-31 DIAGNOSIS — E1140 Type 2 diabetes mellitus with diabetic neuropathy, unspecified: Secondary | ICD-10-CM

## 2014-07-31 DIAGNOSIS — E1165 Type 2 diabetes mellitus with hyperglycemia: Secondary | ICD-10-CM

## 2014-07-31 DIAGNOSIS — Z23 Encounter for immunization: Secondary | ICD-10-CM

## 2014-07-31 MED ORDER — CANAGLIFLOZIN 300 MG PO TABS: ORAL_TABLET | ORAL | Status: DC

## 2014-07-31 NOTE — Progress Notes (Signed)
Patient ID: Theresa Huffman, female   DOB: [birth year], 68 y.o.   MRN: [id number]  HPI: Theresa Huffman is a 68 y.o.-year-old female, returning for f/u for DM2, dx 2010, non-insulin-dependent, uncontrolled, with complications (Peripheral neuropathy, mild CKD). Last visit 6 mo ago.  Last hemoglobin A1c was: Lab Results  Component Value Date   HGBA1C 8.6* 06/12/2014   HGBA1C 7.8* 02/13/2014   HGBA1C 8.2* 07/10/2013  Prev. HbA1C was 8.5% in 04/2011. She believes her HbA1c increased due to getting back to eating sweats.   Pt is on a regimen of: - Metformin 1000 bid - Glipizide 5 bid She ran out of Invokana after finishing the Rx - ???? We started Cycloset - 1.6 mg >> ran out of it and did not refill We had to stop Januvia 100 - expensive. She is in the doughnut hole by the end of the year.   Pt checks her sugars once a day and they are worse: - am: 150-200 (before relaxing her diet: 100-105) >> 158-210 >> 130-235, lately 127-164 >> 91-155 (195) >> 118, 144-181, 204 - before lunch: 208 >> 186 >> n/c - before dinner: 125-159 >> 114-163 >> n/c  - 2h after dinner: 225 >> 139-189 >> n/c - bedtime: 175-205 >> 142-189 >> 115, 138, 141 >> n/c No lows. Lowest sugar was 118; she has hypoglycemia awareness at 80. Highest sugar was low 200s.  She was considering GBP, but is afraid of the sx. She is a stress eater and has a lot of stress at home. She tells me she eats a bowl of honey nut cheerios with milk at b'fast and at night.  - has mild CKD, last BUN/creatinine:  Lab Results  Component Value Date   BUN 14 02/13/2014   CREATININE 0.9 02/13/2014  Has h/o angioedema - ACEI stopped - last set of lipids: Lab Results  Component Value Date   CHOL 161 05/27/2013   HDL 44.60 05/27/2013   LDLDIRECT 81.4 05/27/2013   TRIG 258.0* 05/27/2013   CHOLHDL 4 05/27/2013  She is on Zocor. She takes ASA 81. - last eye exam was in 2013 >> due for an eye exam (04/2014). No DR.  - + numbness and tingling in  her feet - improved   Pt also has a history of HTN, Hypothyroidism - last TSH 2.08 2 mo ago, vit D def., HL, morbid obesity, lymphedema, vv insuff., GERD, lumbago, chronic bilateral knee pain. No h/o UTIs.  I reviewed pt's medications, allergies, PMH, social hx, family hx and no changes required, except as mentioned above.  ROS: Constitutional: + weight gain and loss, no fatigue, no subjective hyperthermia/hypothermia; Eyes: no blurry vision, no xerophthalmia ENT: no sore throat, no nodules palpated in throat, no dysphagia/odynophagia, no hoarseness Cardiovascular: no CP/SOB/palpitations/+ leg swelling - improved Respiratory: no cough/no SOB Gastrointestinal: no N/V/D/no C Musculoskeletal: no muscle/ joint aches Skin: no rashes  PE: BP 122/64  Pulse 113  Temp(Src) 97.9 F (36.6 C) (Oral)  Resp 16  Wt 383 lb (173.728 kg)  SpO2 95% Wt Readings from Last 3 Encounters:  07/31/14 383 lb (173.728 kg)  07/03/14 394 lb (178.717 kg)  06/23/14 399 lb (180.985 kg)   Constitutional: obesity grade 3, in NAD Eyes: PERRLA, EOMI, no exophthalmos ENT: moist mucous membranes, no thyromegaly, no cervical lymphadenopathy Cardiovascular: tachycardia, RR, No MRG Respiratory: CTA B Gastrointestinal: abdomen soft, NT, ND, BS+ Musculoskeletal: strength intact in all 4 Skin: moist, warm, no rashes Severe lymphedema bilateral legs, but improved  ASSESSMENT: 1. DM2, non-insulin-dependent, uncontrolled, with complications - PN, on Neurontin  PLAN:  1. Patient with long-standing, recently worse controlled diabetes after stopping Invokana  - she is not sure why she did not refill the Rx... - I suggested to restart it:  Continue Metformin 1000 mg 2x a day Restart Invokana 300 mg daily - no SEs  Continue Glipizide 5 mg 2x a day - advised her to continue checking her sugars at different times of the day - check 2 times a day, rotating checks >> need checks later in the day, too - given flu vaccine -  advised to stop the honey nut cheerios - advised to schedule new eye exam - will check a HbA1c and a lipid panel at next visit - Return to clinic in 1.5 months with sugar log

## 2014-08-04 ENCOUNTER — Encounter: Payer: Self-pay | Admitting: Registered Nurse

## 2014-08-04 ENCOUNTER — Encounter: Payer: Medicare Other | Attending: Physical Medicine & Rehabilitation | Admitting: Registered Nurse

## 2014-08-04 VITALS — BP 165/73 | HR 108 | Resp 18 | Wt 380.6 lb

## 2014-08-04 DIAGNOSIS — M7541 Impingement syndrome of right shoulder: Secondary | ICD-10-CM | POA: Diagnosis not present

## 2014-08-04 DIAGNOSIS — Z79899 Other long term (current) drug therapy: Secondary | ICD-10-CM | POA: Insufficient documentation

## 2014-08-04 DIAGNOSIS — G8929 Other chronic pain: Secondary | ICD-10-CM | POA: Insufficient documentation

## 2014-08-04 DIAGNOSIS — M75102 Unspecified rotator cuff tear or rupture of left shoulder, not specified as traumatic: Secondary | ICD-10-CM | POA: Diagnosis not present

## 2014-08-04 DIAGNOSIS — M25561 Pain in right knee: Secondary | ICD-10-CM | POA: Insufficient documentation

## 2014-08-04 DIAGNOSIS — M7542 Impingement syndrome of left shoulder: Secondary | ICD-10-CM

## 2014-08-04 DIAGNOSIS — M5137 Other intervertebral disc degeneration, lumbosacral region: Secondary | ICD-10-CM

## 2014-08-04 DIAGNOSIS — M25562 Pain in left knee: Secondary | ICD-10-CM | POA: Insufficient documentation

## 2014-08-04 DIAGNOSIS — Z5181 Encounter for therapeutic drug level monitoring: Secondary | ICD-10-CM | POA: Diagnosis not present

## 2014-08-04 MED ORDER — HYDROCODONE-ACETAMINOPHEN 10-325 MG PO TABS: 1.0000 | ORAL_TABLET | Freq: Three times a day (TID) | ORAL | Status: DC

## 2014-08-04 MED ORDER — MORPHINE SULFATE ER 30 MG PO TBCR: 30.0000 mg | EXTENDED_RELEASE_TABLET | Freq: Two times a day (BID) | ORAL | Status: DC

## 2014-08-04 NOTE — Progress Notes (Signed)
Subjective:    Patient ID: Theresa Huffman, female    DOB: Mar 15, 1946, 68 y.o.   MRN: [id number]  HPI: Theresa Huffman is a 68 year old female who returns for follow up for chronic pain and medication refill. She says her pain is in her bilateral shoulders, lower back, and bilateral knees. She rates her pain 8. Her current exercise regime is walking and performing stretching exercises.  Pain Inventory Average Pain 8 Pain Right Now 8 My pain is burning and aching  In the last 24 hours, has pain interfered with the following? General activity 8 Relation with others 7 Enjoyment of life 7 What TIME of day is your pain at its worst? morning and evening Sleep (in general) Fair  Pain is worse with: walking, bending and standing Pain improves with: rest and medication Relief from Meds: 6  Mobility ability to climb steps?  no do you drive?  yes use a wheelchair  Function disabled: date disabled . I need assistance with the following:  meal prep, household duties and shopping  Neuro/Psych depression  Prior Studies Any changes since last visit?  no  Physicians involved in your care Any changes since last visit?  no   Family History  Problem Relation Age of Onset  . Cancer Mother     lung and colon  . Stroke Sister    History   Social History  . Marital Status: Married    Spouse Name: N/A    Number of Children: 1  . Years of Education: N/A   Occupational History  . Diability    Social History Main Topics  . Smoking status: Never Smoker   . Smokeless tobacco: Never Used  . Alcohol Use: No  . Drug Use: No  . Sexual Activity: None   Other Topics Concern  . None   Social History Narrative   Lives in [place], one adult child      Caffeine use: coffee daily in am   Past Surgical History  Procedure Laterality Date  . Cholecystectomy    . Tubal ligation     Past Medical History  Diagnosis Date  . Hyperlipidemia   . Hypertension   . Low  back pain   . Morbid obesity   . Diabetes mellitus     type II  . Thyroid disease     hypothyroidism  . GERD (gastroesophageal reflux disease)   . Lymphedema   . Lumbosacral spondylosis without myelopathy   . Pain in limb   . Lumbago   . Facet syndrome, lumbar   . Lymphedema   . Primary localized osteoarthrosis, lower leg   . Hypercholesteremia    BP 165/73  Pulse 108  Resp 18  Wt 380 lb 9.6 oz (172.639 kg)  SpO2 93%  Opioid Risk Score:   Fall Risk Score: Moderate Fall Risk (6-13 points) (previoulsy educated and given handout) Review of Systems  Neurological: Positive for weakness.  Psychiatric/Behavioral: Positive for dysphoric mood.  All other systems reviewed and are negative.      Objective:   Physical Exam  Nursing note and vitals reviewed. Constitutional: She is oriented to person, place, and time. She appears well-developed and well-nourished.  Morbid Obesity  HENT:  Head: Normocephalic and atraumatic.  Neck: Normal range of motion. Neck supple.  Cardiovascular: Normal rate and regular rhythm.   Pulmonary/Chest: Effort normal and breath sounds normal.  Musculoskeletal:  Normal Muscle Bulk and Muscle Testing Reveals: Upper Extremities: Full ROM and Muscle  Strength 5/5 Right AC Joint Tenderness Noted Thoracic Paraspinal Tenderness: T-8- T-10 Lumbar Paraspinal Tenderness: L-3- L-5 Lower Extremities: Full ROM and Muscle Strength 5/5 Left Leg Flexion Produces Pain into Patella: No Swelling or Tenderness Noted Arises from chair with slight difficulty/ Using Loftstrand Crutches Wide Based Gait  Neurological: She is alert and oriented to person, place, and time.  Skin: Skin is warm and dry.  Psychiatric: She has a normal mood and affect.          Assessment & Plan:  1. Lumbar pain chronic, no radiculopathy: Lumbar degenerative disc L4-5 and L5-S1.  Refilled: HYDRcodone 10/325mg  one tablet TID #90.  MS CONTIN 30 mg one every 12 hours #60.  2. Bilateral  knee osteoarthritis: Continue Robaxin. Use Heat therapy. Continue to current medication regime.  20 minutes of face to face patient care time was spent during this visit. All questions were encouraged and answered.   F/U in 1 month

## 2014-08-09 ENCOUNTER — Other Ambulatory Visit: Payer: Self-pay | Admitting: Family Medicine

## 2014-08-12 ENCOUNTER — Ambulatory Visit (HOSPITAL_BASED_OUTPATIENT_CLINIC_OR_DEPARTMENT_OTHER): Payer: Medicare Other | Admitting: Physical Medicine & Rehabilitation

## 2014-08-12 ENCOUNTER — Encounter: Payer: Self-pay | Admitting: Physical Medicine & Rehabilitation

## 2014-08-12 ENCOUNTER — Encounter: Payer: Medicare Other | Attending: Physical Medicine and Rehabilitation

## 2014-08-12 VITALS — BP 143/67 | HR 96 | Resp 14 | Ht 63.0 in | Wt 380.0 lb

## 2014-08-12 DIAGNOSIS — G894 Chronic pain syndrome: Secondary | ICD-10-CM

## 2014-08-12 DIAGNOSIS — M7541 Impingement syndrome of right shoulder: Secondary | ICD-10-CM | POA: Diagnosis not present

## 2014-08-12 DIAGNOSIS — Z5181 Encounter for therapeutic drug level monitoring: Secondary | ICD-10-CM | POA: Insufficient documentation

## 2014-08-12 DIAGNOSIS — M174 Other bilateral secondary osteoarthritis of knee: Secondary | ICD-10-CM | POA: Diagnosis not present

## 2014-08-12 DIAGNOSIS — Z79899 Other long term (current) drug therapy: Secondary | ICD-10-CM | POA: Insufficient documentation

## 2014-08-12 DIAGNOSIS — M5137 Other intervertebral disc degeneration, lumbosacral region: Secondary | ICD-10-CM | POA: Diagnosis not present

## 2014-08-12 DIAGNOSIS — M75102 Unspecified rotator cuff tear or rupture of left shoulder, not specified as traumatic: Secondary | ICD-10-CM

## 2014-08-12 DIAGNOSIS — M7542 Impingement syndrome of left shoulder: Secondary | ICD-10-CM

## 2014-08-12 NOTE — Progress Notes (Signed)
Subjective:    Patient ID: Theresa Huffman, female    DOB: [birth year], 68 y.o.   MRN: [id number]  HPI Here for followup of wheelchair evaluation. Additional requests for elevating  leg rests  Problem list includes lymphedema. Unable to wear support hose. Patient does elevate legs when in a chair during the day.   Pain Inventory Average Pain 8 Pain Right Now 8 My pain is constant, dull and aching  In the last 24 hours, has pain interfered with the following? General activity 10 Relation with others 10 Enjoyment of life 10 What TIME of day is your pain at its worst? all Sleep (in general) Fair  Pain is worse with: walking, bending and sitting Pain improves with: rest and medication Relief from Meds: 6  Mobility use a cane ability to climb steps?  no  Function disabled: date disabled .  Neuro/Psych No problems in this area  Prior Studies Any changes since last visit?  no  Physicians involved in your care Any changes since last visit?  no   Family History  Problem Relation Age of Onset  . Cancer Mother     lung and colon  . Stroke Sister    History   Social History  . Marital Status: Married    Spouse Name: N/A    Number of Children: 1  . Years of Education: N/A   Occupational History  . Diability    Social History Main Topics  . Smoking status: Never Smoker   . Smokeless tobacco: Never Used  . Alcohol Use: No  . Drug Use: No  . Sexual Activity: None   Other Topics Concern  . None   Social History Narrative   Lives in [place], one adult child      Caffeine use: coffee daily in am   Past Surgical History  Procedure Laterality Date  . Cholecystectomy    . Tubal ligation     Past Medical History  Diagnosis Date  . Hyperlipidemia   . Hypertension   . Low back pain   . Morbid obesity   . Diabetes mellitus     type II  . Thyroid disease     hypothyroidism  . GERD (gastroesophageal reflux disease)   . Lymphedema   .  Lumbosacral spondylosis without myelopathy   . Pain in limb   . Lumbago   . Facet syndrome, lumbar   . Lymphedema   . Primary localized osteoarthrosis, lower leg   . Hypercholesteremia    BP 143/67  Pulse 96  Resp 14  Ht 5\' 3"  (1.6 m)  Wt 380 lb (172.367 kg)  BMI 67.33 kg/m2  SpO2 96%  Opioid Risk Score:   Fall Risk Score: Moderate Fall Risk (6-13 points)  Review of Systems     Objective:   Physical Exam  Evidence of brawny lymphedema mainly in the posterior calf area right side greater than the left. There is a panniculus from the medial thigh area  No evidence of erythema. No drainage  Weight 172.6KG    Assessment & Plan:  Chief complaint, assessment for power wheelchair  Shoulder pain rated 7/10  Back pain rated 8/10  Knee pain rated 8/10  Please refer to vitals sheet for height and weight  Independent with toileting and dressing no problems with feeding needs assist for bathing  No falls at home. Has decreased balance  No cognitive dysfunction denies any minimal memory loss.  Patient has been using a power wheelchair for 8 years  in the home environment to help with mobility getting room to room. Cannot use a scooter because she cannot set safely transfer in and out.  Please refer to vitals sheet.  Heart regular rate and rhythm  Lungs clear to auscultation  Abdomen soft nontender palpation  Motor strength is 4/5 bilateral deltoid, bicep, tricep, grip, hip flexor, knee extensors, ankle dorsiflexor and flexor  Range of motion is full in bilateral upper extremities  Range of motion is reduced bilateral lower extremities with hip flexion knee extension  Hip abduction and hip abduction.  Unable to go from sit to stand without assistive device  Able to stand without assistive device increased base of support.  Impression #1  Chronic low back pain degenerative disc disease  2. Chronic end-stage osteoarthritis of the knees, ambulation limited, has been wheelchair  dependent for 8 years, very limited distance for Loftstrand crutches  3. Rotator cuff syndrome bilateral this limits her upper extremity use and repetitive fashion for activities such as propelling manual wheelchair  Patient is mentally and physically able to operate a power wheelchair safely in home  Patient is willing and motivated to use a power wheelchair 4. Lymphedema, severe, patient requires elevating leg rests

## 2014-08-12 NOTE — Patient Instructions (Signed)
WC eval revision with ICD 10 codes completed

## 2014-08-25 ENCOUNTER — Ambulatory Visit (INDEPENDENT_AMBULATORY_CARE_PROVIDER_SITE_OTHER): Payer: Medicare Other | Admitting: Family Medicine

## 2014-08-25 ENCOUNTER — Encounter: Payer: Self-pay | Admitting: Family Medicine

## 2014-08-25 VITALS — BP 144/86 | HR 130 | Temp 97.4°F | Wt 374.2 lb

## 2014-08-25 DIAGNOSIS — E785 Hyperlipidemia, unspecified: Secondary | ICD-10-CM | POA: Diagnosis not present

## 2014-08-25 DIAGNOSIS — B372 Candidiasis of skin and nail: Secondary | ICD-10-CM

## 2014-08-25 LAB — LIPID PANEL
CHOLESTEROL: 183 mg/dL (ref 0–200)
HDL: 47.5 mg/dL (ref >39.00)
NonHDL: 135.5
TRIGLYCERIDES: 292 mg/dL — AB (ref 0.0–149.0)
Total CHOL/HDL Ratio: 4
VLDL: 58.4 mg/dL — ABNORMAL HIGH (ref 0.0–40.0)

## 2014-08-25 LAB — LDL CHOLESTEROL, DIRECT: Direct LDL: 92.5 mg/dL

## 2014-08-25 MED ORDER — NYSTATIN-TRIAMCINOLONE 100000-0.1 UNIT/GM-% EX OINT: 1.0000 "application " | TOPICAL_OINTMENT | Freq: Two times a day (BID) | CUTANEOUS | Status: DC

## 2014-08-25 MED ORDER — NYSTATIN 100000 UNIT/GM EX POWD: CUTANEOUS | Status: DC

## 2014-08-25 NOTE — Progress Notes (Signed)
Pre visit review using our clinic review tool, if applicable. No additional management support is needed unless otherwise documented below in the visit note. 

## 2014-08-25 NOTE — Assessment & Plan Note (Signed)
Due for labs.  Will order these today. Orders Placed This Encounter  Procedures  . Lipid panel

## 2014-08-25 NOTE — Patient Instructions (Signed)
Good to see you. Please use mycolog and nystatin powder as directed. Call me if no improvement by next week.

## 2014-08-25 NOTE — Progress Notes (Signed)
Subjective:    Patient ID: Theresa Huffman, female    DOB: February 23, 1946, 68 y.o.   MRN: [id number]  Rash Pertinent negatives include no fatigue or fever.    68 yo morbidly obese female with h/o hypothyroidism, DM, HLD here with her husband for rash.  Noticed it a few days ago- very itchy and irritated.  Has not noticed any drainage from rash but difficult for her to see it.  Has never had anything like this before.  No fevers.  No nausea or vomiting.  Current Outpatient Prescriptions on File Prior to Visit  Medication Sig Dispense Refill  . albuterol (PROAIR HFA) 108 (90 BASE) MCG/ACT inhaler Inhale 2 puffs into the lungs every 4 (four) hours as needed.  1 Inhaler  1  . amLODipine (NORVASC) 5 MG tablet TAKE 1 TABLET BY MOUTH DAILY - NEED TO BE SEEN FOR FURTHER REFILLS  90 tablet  1  . aspirin EC 81 MG tablet Take 162 mg by mouth daily.      . Bromocriptine Mesylate 0.8 MG TABS Take 2 tablets po first thing in am daily  180 tablet  3  . Calcium Carbonate-Vitamin D 600-400 MG-UNIT per tablet Take 1 tablet by mouth daily. 1200mg  once daily      . Canagliflozin (INVOKANA) 300 MG TABS Take 1 tablet in am  30 tablet  3  . furosemide (LASIX) 40 MG tablet Take 40 mg by mouth daily.      . furosemide (LASIX) 40 MG tablet TAKE 1 TABLET BY MOUTH TWICE A DAY  60 tablet  2  . gabapentin (NEURONTIN) 300 MG capsule Take two capsule by mouth three times daily  180 capsule  5  . glipiZIDE (GLUCOTROL) 5 MG tablet TAKE ONE (1) TABLET BY MOUTH TWICE DAILYBEFORE MEALS  60 tablet  2  . HYDROcodone-acetaminophen (NORCO) 10-325 MG per tablet Take 1 tablet by mouth 3 (three) times daily. As needed for back or leg pain  90 tablet  0  . ibuprofen (ADVIL,MOTRIN) 800 MG tablet Take 1 tablet (800 mg total) by mouth 3 (three) times daily.  90 tablet  2  . metFORMIN (GLUCOPHAGE) 1000 MG tablet TAKE 1 TABLET BY MOUTH TWICE A DAY  60 tablet  2  . methocarbamol (ROBAXIN) 500 MG tablet Take 1 tablet (500 mg total) by  mouth 3 (three) times daily.  90 tablet  3  . moexipril (UNIVASC) 15 MG tablet Take 15 mg by mouth daily.       Marland Kitchen morphine (MS CONTIN) 30 MG 12 hr tablet Take 1 tablet (30 mg total) by mouth every 12 (twelve) hours.  60 tablet  0  . potassium chloride SA (K-DUR,KLOR-CON) 20 MEQ tablet TAKE 1 TABLET BY MOUTH DAILY  30 tablet  5  . simvastatin (ZOCOR) 20 MG tablet TAKE 1 TABLET BY MOUTH DAILY  30 tablet  0  . SYNTHROID 175 MCG tablet TAKE 1 TABLET BY MOUTH DAILY  60 tablet  1  . venlafaxine (EFFEXOR) 75 MG tablet Take three tablets by mouth every night at bedtime  90 tablet  3   No current facility-administered medications on file prior to visit.    Allergies  Allergen Reactions  . Ace Inhibitors     Angioedema   . Erythromycin     REACTION: Nausea and vomiting    Past Medical History  Diagnosis Date  . Hyperlipidemia   . Hypertension   . Low back pain   . Morbid obesity   .  Diabetes mellitus     type II  . Thyroid disease     hypothyroidism  . GERD (gastroesophageal reflux disease)   . Lymphedema   . Lumbosacral spondylosis without myelopathy   . Pain in limb   . Lumbago   . Facet syndrome, lumbar   . Lymphedema   . Primary localized osteoarthrosis, lower leg   . Hypercholesteremia     Past Surgical History  Procedure Laterality Date  . Cholecystectomy    . Tubal ligation      Family History  Problem Relation Age of Onset  . Cancer Mother     lung and colon  . Stroke Sister     History   Social History  . Marital Status: Married    Spouse Name: N/A    Number of Children: 1  . Years of Education: N/A   Occupational History  . Diability    Social History Main Topics  . Smoking status: Never Smoker   . Smokeless tobacco: Never Used  . Alcohol Use: No  . Drug Use: No  . Sexual Activity: Not on file   Other Topics Concern  . Not on file   Social History Narrative   Lives in [place], one adult child      Caffeine use: coffee daily in am    The PMH, PSH, Social History, Family History, Medications, and allergies have been reviewed in [facility], and have been updated if relevant.   Review of Systems  Constitutional: Negative for fever and fatigue.  Genitourinary: Negative for vaginal discharge.  Skin: Positive for rash.  All other systems reviewed and are negative.      Objective:   Physical Exam  Constitutional: She appears well-developed and well-nourished.  Morbidly obese, NAD  HENT:  Head: Normocephalic.  Skin: Rash noted.  Erythematous, large rash with satellite lesions between abdominal folds  Psychiatric: She has a normal mood and affect. Her behavior is normal. Judgment and thought content normal.          Assessment & Plan:

## 2014-08-25 NOTE — Assessment & Plan Note (Signed)
New- given her body habitus, explained to pt that area holds more moisture and she needs to try to keep it dry.  Also, Invokana can make her more susceptible to yeast infections. Will treat mycolog topically with nystatin powder. Call or return to clinic prn if these symptoms worsen or fail to improve as anticipated.

## 2014-09-02 ENCOUNTER — Encounter: Payer: Medicare Other | Attending: Physical Medicine & Rehabilitation | Admitting: Registered Nurse

## 2014-09-02 ENCOUNTER — Encounter: Payer: Self-pay | Admitting: Registered Nurse

## 2014-09-02 VITALS — BP 149/61 | HR 79 | Resp 14 | Ht 63.0 in | Wt 369.0 lb

## 2014-09-02 DIAGNOSIS — Z79899 Other long term (current) drug therapy: Secondary | ICD-10-CM | POA: Diagnosis not present

## 2014-09-02 DIAGNOSIS — M75102 Unspecified rotator cuff tear or rupture of left shoulder, not specified as traumatic: Secondary | ICD-10-CM | POA: Insufficient documentation

## 2014-09-02 DIAGNOSIS — M25562 Pain in left knee: Secondary | ICD-10-CM

## 2014-09-02 DIAGNOSIS — G8929 Other chronic pain: Secondary | ICD-10-CM | POA: Insufficient documentation

## 2014-09-02 DIAGNOSIS — M5137 Other intervertebral disc degeneration, lumbosacral region: Secondary | ICD-10-CM

## 2014-09-02 DIAGNOSIS — M7542 Impingement syndrome of left shoulder: Secondary | ICD-10-CM

## 2014-09-02 DIAGNOSIS — Z5181 Encounter for therapeutic drug level monitoring: Secondary | ICD-10-CM | POA: Diagnosis not present

## 2014-09-02 DIAGNOSIS — M7541 Impingement syndrome of right shoulder: Secondary | ICD-10-CM | POA: Insufficient documentation

## 2014-09-02 DIAGNOSIS — G894 Chronic pain syndrome: Secondary | ICD-10-CM

## 2014-09-02 DIAGNOSIS — M25561 Pain in right knee: Secondary | ICD-10-CM | POA: Insufficient documentation

## 2014-09-02 MED ORDER — MORPHINE SULFATE ER 30 MG PO TBCR: 30.0000 mg | EXTENDED_RELEASE_TABLET | Freq: Two times a day (BID) | ORAL | Status: DC

## 2014-09-02 MED ORDER — HYDROCODONE-ACETAMINOPHEN 10-325 MG PO TABS: 1.0000 | ORAL_TABLET | Freq: Three times a day (TID) | ORAL | Status: DC

## 2014-09-02 NOTE — Progress Notes (Signed)
Subjective:    Patient ID: Theresa Huffman, female    DOB: 19-Jun-1946, 68 y.o.   MRN: [id number]  HPI: [name] is a 68 year old female who returns for follow up for chronic pain and medication refill. She says her pain is in her lower back, and bilateral knees. She rates her pain 8. Her current exercise regime is walking and performing stretching exercises.  Pain Inventory Average Pain 8 Pain Right Now 8 My pain is constant, burning and aching  In the last 24 hours, has pain interfered with the following? General activity 9 Relation with others 7 Enjoyment of life 7 What TIME of day is your pain at its worst? morning, daytime Sleep (in general) Fair  Pain is worse with: walking, bending and standing Pain improves with: rest and medication Relief from Meds: 6  Mobility walk with assistance how many minutes can you walk? 10 ability to climb steps?  no do you drive?  yes use a wheelchair Do you have any goals in this area?  yes  Function disabled: date disabled 2001 I need assistance with the following:  meal prep, household duties and shopping Do you have any goals in this area?  yes  Neuro/Psych weakness trouble walking depression  Prior Studies Any changes since last visit?  no  Physicians involved in your care Any changes since last visit?  no   Family History  Problem Relation Age of Onset  . Cancer Mother     lung and colon  . Stroke Sister    History   Social History  . Marital Status: Married    Spouse Name: N/A    Number of Children: 1  . Years of Education: N/A   Occupational History  . Diability    Social History Main Topics  . Smoking status: Never Smoker   . Smokeless tobacco: Never Used  . Alcohol Use: No  . Drug Use: No  . Sexual Activity: None   Other Topics Concern  . None   Social History Narrative   Lives in [place], one adult child      Caffeine use: coffee daily in am   Past Surgical History    Procedure Laterality Date  . Cholecystectomy    . Tubal ligation     Past Medical History  Diagnosis Date  . Hyperlipidemia   . Hypertension   . Low back pain   . Morbid obesity   . Diabetes mellitus     type II  . Thyroid disease     hypothyroidism  . GERD (gastroesophageal reflux disease)   . Lymphedema   . Lumbosacral spondylosis without myelopathy   . Pain in limb   . Lumbago   . Facet syndrome, lumbar   . Lymphedema   . Primary localized osteoarthrosis, lower leg   . Hypercholesteremia    BP 149/61 mmHg  Pulse 79  Resp 14  Ht 5\' 3"  (1.6 m)  Wt 369 lb (167.377 kg)  BMI 65.38 kg/m2  SpO2 91%  Opioid Risk Score:   Fall Risk Score: Moderate Fall Risk (6-13 points)  Review of Systems     Objective:   Physical Exam  Constitutional: She is oriented to person, place, and time. She appears well-developed and well-nourished.  HENT:  Head: Normocephalic and atraumatic.  Neck: Normal range of motion. Neck supple.  Cardiovascular: Normal rate and regular rhythm.   Pulmonary/Chest: Effort normal and breath sounds normal.  Musculoskeletal:  Normal Muscle Bulk and  Muscle Testing Reveals:  Upper Extremities: Full ROM and Muscle Strength 5/5 Bilateral AC Joint Tenderness Lower extremities: Full ROM and Muscle Strength 5/5 Right Leg Flexion Produces pain into Patella Using Loftstrand Crutches for assistance Narrow based gait    Neurological: She is alert and oriented to person, place, and time.  Skin: Skin is warm and dry.  Psychiatric: She has a normal mood and affect.  Nursing note and vitals reviewed.         Assessment & Plan:  1. Lumbar pain chronic, no radiculopathy: Lumbar degenerative disc L4-5 and L5-S1.  Refilled: HYDRcodone 10/325mg  one tablet TID #90.  MS CONTIN 30 mg one every 12 hours #60.  2. Bilateral knee osteoarthritis: Continue Robaxin. Use Heat therapy. Continue to current medication regime.  20 minutes of face to face patient care  time was spent during this visit. All questions were encouraged and answered.   F/U in 1 month

## 2014-09-07 ENCOUNTER — Other Ambulatory Visit: Payer: Self-pay | Admitting: Family Medicine

## 2014-09-08 ENCOUNTER — Other Ambulatory Visit: Payer: Self-pay | Admitting: *Deleted

## 2014-09-08 MED ORDER — METHOCARBAMOL 500 MG PO TABS: 500.0000 mg | ORAL_TABLET | Freq: Three times a day (TID) | ORAL | Status: DC

## 2014-09-09 ENCOUNTER — Other Ambulatory Visit: Payer: Self-pay | Admitting: Family Medicine

## 2014-09-11 ENCOUNTER — Other Ambulatory Visit: Payer: Self-pay | Admitting: Family Medicine

## 2014-09-11 ENCOUNTER — Ambulatory Visit (INDEPENDENT_AMBULATORY_CARE_PROVIDER_SITE_OTHER): Payer: Medicare Other | Admitting: Internal Medicine

## 2014-09-11 ENCOUNTER — Encounter: Payer: Self-pay | Admitting: Internal Medicine

## 2014-09-11 VITALS — BP 128/70 | HR 100 | Temp 97.3°F | Resp 12 | Wt 378.0 lb

## 2014-09-11 DIAGNOSIS — IMO0002 Reserved for concepts with insufficient information to code with codable children: Secondary | ICD-10-CM

## 2014-09-11 DIAGNOSIS — E1141 Type 2 diabetes mellitus with diabetic mononeuropathy: Secondary | ICD-10-CM | POA: Diagnosis not present

## 2014-09-11 DIAGNOSIS — E1165 Type 2 diabetes mellitus with hyperglycemia: Principal | ICD-10-CM

## 2014-09-11 DIAGNOSIS — E1149 Type 2 diabetes mellitus with other diabetic neurological complication: Secondary | ICD-10-CM

## 2014-09-11 MED ORDER — GLIPIZIDE 10 MG PO TABS: 10.0000 mg | ORAL_TABLET | Freq: Every day | ORAL | Status: DC

## 2014-09-11 NOTE — Patient Instructions (Signed)
Continue Metformin 1000 mg 2x a day Increase Glipizide to 10 mg 2x a day  Please return in 1.5 months with your sugar log.   Please come back for labs tomorrow.

## 2014-09-11 NOTE — Progress Notes (Signed)
Patient ID: Theresa Huffman, female   DOB: 12/09/1945, 68 y.o.   MRN: [id number]  HPI: Theresa Huffman is a 68 y.o.-year-old female, returning for f/u for DM2, dx 2010, non-insulin-dependent, uncontrolled, with complications (Peripheral neuropathy, mild CKD). Last visit 1.5 mo ago.  Last hemoglobin A1c was: Lab Results  Component Value Date   HGBA1C 8.6* 06/12/2014   HGBA1C 7.8* 02/13/2014   HGBA1C 8.2* 07/10/2013  Prev. HbA1C was 8.5% in 04/2011. She believes her HbA1c increased due to getting back to eating sweats.   Pt is on a regimen of: - Metformin 1000 bid - Glipizide 5 bid - Invokana 300 mg - restarted at last visit as she had run out of it, but she developed a yeast inf >> stopped it 08/26/2014 We started Cycloset - 1.6 mg >> ran out of it and did not refill We had to stop Januvia 100 - expensive. She is in the doughnut hole by the end of the year.   Pt checks her sugars once a day and they are same: - am: 150-200 (before relaxing her diet: 100-105) >> 158-210 >> 130-235, lately 127-164 >> 91-155 (195) >> 118, 144-181, 204 >> 140-187 - before lunch: 208 >> 186 >> n/c - before dinner: 125-159 >> 114-163 >> n/c  - 2h after dinner: 225 >> 139-189 >> n/c - bedtime: 175-205 >> 142-189 >> 115, 138, 141 >> n/c >> 142-187 No lows. Lowest sugar was 118; she has hypoglycemia awareness at 80. Highest sugar was low 200s.  She was considering GBP, but is afraid of the sx. At last visit, she was telling me that she was eating a bowl of honey nut cheerios with milk at b'fast and at night. I told her to stop eating this, since this would increase her sugars a lot, but she did not stop.   - has mild CKD, last BUN/creatinine:  Lab Results  Component Value Date   BUN 14 02/13/2014   CREATININE 0.9 02/13/2014  Has h/o angioedema - ACEI stopped - last set of lipids: Lab Results  Component Value Date   CHOL 183 08/25/2014   HDL 47.50 08/25/2014   LDLDIRECT 92.5 08/25/2014   TRIG  292.0* 08/25/2014   CHOLHDL 4 08/25/2014  She is on Zocor. She takes ASA 81. - last eye exam was in 2013 >> missed the appt for eye exam (04/2014). No DR.  - + numbness and tingling in her feet - improved   Pt also has a history of HTN, Hypothyroidism - last TSH: Lab Results  Component Value Date   TSH 2.08 04/18/2013  vit D def., HL, morbid obesity, lymphedema, vv insuff., GERD, lumbago, chronic bilateral knee pain. No h/o UTIs.  I reviewed pt's medications, allergies, PMH, social hx, family hx and no changes required, except as mentioned above.  ROS: Constitutional: no weight gain and loss, no fatigue, no subjective hyperthermia/hypothermia; + poor sleep Eyes: no blurry vision, no xerophthalmia ENT: no sore throat, no nodules palpated in throat, no dysphagia/odynophagia, no hoarseness Cardiovascular: no CP/+SOBno /palpitations/+ leg swelling - improved Respiratory: no cough/+SOB Gastrointestinal: no N/V/D/C, + hair loss Musculoskeletal: no muscle/ joint aches Skin: no rashes  PE: BP 128/70 mmHg  Pulse 100  Temp(Src) 97.3 F (36.3 C) (Oral)  Resp 12  Wt 378 lb (171.46 kg)  SpO2 93% Body mass index is 66.98 kg/(m^2).  Wt Readings from Last 3 Encounters:  09/11/14 378 lb (171.46 kg)  09/02/14 369 lb (167.377 kg)  08/25/14 374 lb 4  oz (169.759 kg)   Constitutional: obesity grade 3, in NAD Eyes: PERRLA, EOMI, no exophthalmos ENT: moist mucous membranes, no thyromegaly, no cervical lymphadenopathy Cardiovascular: tachycardia, RR, No MRG Respiratory: CTA B Gastrointestinal: abdomen soft, NT, ND, BS+ Musculoskeletal: strength intact in all 4 Skin: moist, warm, no rashes Severe lymphedema bilateral legs, but improved  ASSESSMENT: 1. DM2, non-insulin-dependent, uncontrolled, with complications - PN, on Neurontin  PLAN:  1. Patient with long-standing,with worse controlled diabetes after stopping Invokana. - I suggested to increase Glipizide and stop the cheerio bowls  bid:  Continue Metformin 1000 mg 2x a day Increase Glipizide to 10 mg 2x a day - advised her to continue checking her sugars at different times of the day - check 2 times a day, rotating checkso - given flu vaccine at last visit - advised to schedule new eye exam - will check a HbA1c tomorrow - Return to clinic in 1.5 months with sugar log

## 2014-09-29 ENCOUNTER — Other Ambulatory Visit: Payer: Self-pay | Admitting: *Deleted

## 2014-09-29 MED ORDER — IBUPROFEN 800 MG PO TABS: 800.0000 mg | ORAL_TABLET | Freq: Three times a day (TID) | ORAL | Status: DC

## 2014-09-29 NOTE — Telephone Encounter (Signed)
Rx sent electronically, patient notified

## 2014-09-30 ENCOUNTER — Other Ambulatory Visit: Payer: Self-pay | Admitting: Physical Medicine & Rehabilitation

## 2014-09-30 ENCOUNTER — Encounter: Payer: Self-pay | Admitting: Registered Nurse

## 2014-09-30 ENCOUNTER — Encounter: Payer: Medicare Other | Attending: Physical Medicine & Rehabilitation | Admitting: Registered Nurse

## 2014-09-30 VITALS — BP 167/85 | HR 100 | Resp 18 | Wt 373.0 lb

## 2014-09-30 DIAGNOSIS — M25562 Pain in left knee: Secondary | ICD-10-CM | POA: Diagnosis not present

## 2014-09-30 DIAGNOSIS — M7541 Impingement syndrome of right shoulder: Secondary | ICD-10-CM | POA: Diagnosis not present

## 2014-09-30 DIAGNOSIS — G8929 Other chronic pain: Secondary | ICD-10-CM

## 2014-09-30 DIAGNOSIS — Z5181 Encounter for therapeutic drug level monitoring: Secondary | ICD-10-CM

## 2014-09-30 DIAGNOSIS — M5137 Other intervertebral disc degeneration, lumbosacral region: Secondary | ICD-10-CM

## 2014-09-30 DIAGNOSIS — Z79899 Other long term (current) drug therapy: Secondary | ICD-10-CM | POA: Diagnosis not present

## 2014-09-30 DIAGNOSIS — M25561 Pain in right knee: Secondary | ICD-10-CM | POA: Diagnosis not present

## 2014-09-30 DIAGNOSIS — M75102 Unspecified rotator cuff tear or rupture of left shoulder, not specified as traumatic: Secondary | ICD-10-CM | POA: Diagnosis not present

## 2014-09-30 DIAGNOSIS — G894 Chronic pain syndrome: Secondary | ICD-10-CM

## 2014-09-30 MED ORDER — HYDROCODONE-ACETAMINOPHEN 10-325 MG PO TABS: 1.0000 | ORAL_TABLET | Freq: Three times a day (TID) | ORAL | Status: DC

## 2014-09-30 MED ORDER — MORPHINE SULFATE ER 30 MG PO TBCR: 30.0000 mg | EXTENDED_RELEASE_TABLET | Freq: Two times a day (BID) | ORAL | Status: DC

## 2014-09-30 NOTE — Progress Notes (Signed)
Subjective:    Patient ID: Theresa Huffman, female    DOB: January 19, 1946, 68 y.o.   MRN: [id number]  HPI: [name] is a 68 year old female who returns for follow up for chronic pain and medication refill. She says her pain is in her lower back, and bilateral knees. She rates her pain 8. Her current exercise regime is walking in her home short distances and performing stretching exercises. She arrived to office hypertensive 167/85 blood pressure rechecked 155/82. On Norvasc PCP following.  Pain Inventory Average Pain 8 Pain Right Now 8 My pain is burning and aching  In the last 24 hours, has pain interfered with the following? General activity 9 Relation with others 7 Enjoyment of life 8 What TIME of day is your pain at its worst? morning Sleep (in general) Poor  Pain is worse with: walking, bending and standing Pain improves with: rest and medication Relief from Meds: 6  Mobility walk with assistance use a cane how many minutes can you walk? 10 ability to climb steps?  yes do you drive?  no  Function disabled: date disabled . I need assistance with the following:  meal prep, household duties and shopping  Neuro/Psych weakness trouble walking depression  Prior Studies Any changes since last visit?  no  Physicians involved in your care Any changes since last visit?  no   Family History  Problem Relation Age of Onset  . Cancer Mother     lung and colon  . Stroke Sister    History   Social History  . Marital Status: Married    Spouse Name: N/A    Number of Children: 1  . Years of Education: N/A   Occupational History  . Diability    Social History Main Topics  . Smoking status: Never Smoker   . Smokeless tobacco: Never Used  . Alcohol Use: No  . Drug Use: No  . Sexual Activity: None   Other Topics Concern  . None   Social History Narrative   Lives in [place], one adult child      Caffeine use: coffee daily in am   Past  Surgical History  Procedure Laterality Date  . Cholecystectomy    . Tubal ligation     Past Medical History  Diagnosis Date  . Hyperlipidemia   . Hypertension   . Low back pain   . Morbid obesity   . Diabetes mellitus     type II  . Thyroid disease     hypothyroidism  . GERD (gastroesophageal reflux disease)   . Lymphedema   . Lumbosacral spondylosis without myelopathy   . Pain in limb   . Lumbago   . Facet syndrome, lumbar   . Lymphedema   . Primary localized osteoarthrosis, lower leg   . Hypercholesteremia    BP 167/85 mmHg  Pulse 100  Resp 18  Wt 373 lb (169.192 kg)  SpO2 90%  Opioid Risk Score:   Fall Risk Score: Moderate Fall Risk (6-13 points) (previously educated and given handout)  Review of Systems  Musculoskeletal: Positive for gait problem.  Neurological: Positive for weakness.  Psychiatric/Behavioral: Positive for dysphoric mood.  All other systems reviewed and are negative.      Objective:   Physical Exam  Constitutional: She is oriented to person, place, and time. She appears well-developed and well-nourished.  HENT:  Head: Normocephalic and atraumatic.  Neck: Normal range of motion. Neck supple.  Cardiovascular: Normal rate and regular  rhythm.   Pulmonary/Chest: Effort normal and breath sounds normal.  Musculoskeletal:  Normal Muscle Bulk and Muscle Testing Reveals: Upper Extremities: Full ROM and Muscle Strength 5/5 Thoracic Paraspinal Tenderness: T-7- T-9 Lumbar Paraspinal Tenderness: L-3- L-5 Lower Extremities: Full ROM and Muscle strength 5/5 Right Leg Flexion Produces pain into Patella Arises from chair with slight difficulty/ Using Lofstrand Crutches for support Wide Based Gait  Neurological: She is alert and oriented to person, place, and time.  Skin: Skin is warm and dry.  Psychiatric: She has a normal mood and affect.  Nursing note and vitals reviewed.         Assessment & Plan:  1. Lumbar pain chronic, no radiculopathy:  Lumbar degenerative disc L4-5 and L5-S1.  Refilled: HYDRcodone 10/325mg  one tablet TID #90.  MS CONTIN 30 mg one every 12 hours #60.  2. Bilateral knee osteoarthritis: Continue Robaxin. Use Heat therapy. Continue to current medication regime.  20 minutes of face to face patient care time was spent during this visit. All questions were encouraged and answered.   F/U in 1 month

## 2014-10-02 ENCOUNTER — Ambulatory Visit (INDEPENDENT_AMBULATORY_CARE_PROVIDER_SITE_OTHER): Payer: Medicare Other | Admitting: Internal Medicine

## 2014-10-02 ENCOUNTER — Encounter: Payer: Self-pay | Admitting: Internal Medicine

## 2014-10-02 VITALS — BP 128/76 | HR 104 | Temp 97.9°F | Wt 368.0 lb

## 2014-10-02 DIAGNOSIS — IMO0002 Reserved for concepts with insufficient information to code with codable children: Secondary | ICD-10-CM

## 2014-10-02 DIAGNOSIS — J069 Acute upper respiratory infection, unspecified: Secondary | ICD-10-CM | POA: Diagnosis not present

## 2014-10-02 DIAGNOSIS — E1141 Type 2 diabetes mellitus with diabetic mononeuropathy: Secondary | ICD-10-CM | POA: Diagnosis not present

## 2014-10-02 DIAGNOSIS — E1165 Type 2 diabetes mellitus with hyperglycemia: Secondary | ICD-10-CM

## 2014-10-02 DIAGNOSIS — E1149 Type 2 diabetes mellitus with other diabetic neurological complication: Secondary | ICD-10-CM

## 2014-10-02 LAB — HEMOGLOBIN A1C: Hgb A1c MFr Bld: 8.8 % — ABNORMAL HIGH (ref 4.6–6.5)

## 2014-10-02 MED ORDER — AZITHROMYCIN 250 MG PO TABS: ORAL_TABLET | ORAL | Status: DC

## 2014-10-02 NOTE — Progress Notes (Signed)
Pre visit review using our clinic review tool, if applicable. No additional management support is needed unless otherwise documented below in the visit note. 

## 2014-10-02 NOTE — Addendum Note (Signed)
Addended by: Marchia Bond on: 10/02/2014 01:44 PM   Modules accepted: Orders

## 2014-10-02 NOTE — Patient Instructions (Signed)
Cough, Adult  A cough is a reflex that helps clear your throat and airways. It can help heal the body or may be a reaction to an irritated airway. A cough may only last 2 or 3 weeks (acute) or may last more than 8 weeks (chronic).  CAUSES Acute cough:  Viral or bacterial infections. Chronic cough:  Infections.  Allergies.  Asthma.  Post-nasal drip.  Smoking.  Heartburn or acid reflux.  Some medicines.  Chronic lung problems (COPD).  Cancer. SYMPTOMS   Cough.  Fever.  Chest pain.  Increased breathing rate.  High-pitched whistling sound when breathing (wheezing).  Colored mucus that you cough up (sputum). TREATMENT   A bacterial cough may be treated with antibiotic medicine.  A viral cough must run its course and will not respond to antibiotics.  Your caregiver may recommend other treatments if you have a chronic cough. HOME CARE INSTRUCTIONS   Only take over-the-counter or prescription medicines for pain, discomfort, or fever as directed by your caregiver. Use cough suppressants only as directed by your caregiver.  Use a cold steam vaporizer or humidifier in your bedroom or home to help loosen secretions.  Sleep in a semi-upright position if your cough is worse at night.  Rest as needed.  Stop smoking if you smoke. SEEK IMMEDIATE MEDICAL CARE IF:   You have pus in your sputum.  Your cough starts to worsen.  You cannot control your cough with suppressants and are losing sleep.  You begin coughing up blood.  You have difficulty breathing.  You develop pain which is getting worse or is uncontrolled with medicine.  You have a fever. MAKE SURE YOU:   Understand these instructions.  Will watch your condition.  Will get help right away if you are not doing well or get worse. Document Released: 04/15/2011 Document Revised: 01/09/2012 Document Reviewed: 04/15/2011 Exit[employer] Patient Information 2015 [employer]. This information is not intended  to replace advice given to you by your health care provider. Make sure you discuss any questions you have with your health care provider.  

## 2014-10-02 NOTE — Progress Notes (Signed)
HPI  Pt presents to the clinic today with c/o headache and cough. She reports this started 1 week ago. The cough is productive of thick green mucous. She has had some associated runny nose, sore throat and  diarrhea but denies blood in her stool. She has tried OTC sinus medication without relief. She has no history of allergies or breathing problems. She has had sick contacts with similar symptoms. Her flu and pneumonia shot are both UTD.  Review of Systems      Past Medical History  Diagnosis Date  . Hyperlipidemia   . Hypertension   . Low back pain   . Morbid obesity   . Diabetes mellitus     type II  . Thyroid disease     hypothyroidism  . GERD (gastroesophageal reflux disease)   . Lymphedema   . Lumbosacral spondylosis without myelopathy   . Pain in limb   . Lumbago   . Facet syndrome, lumbar   . Lymphedema   . Primary localized osteoarthrosis, lower leg   . Hypercholesteremia     Family History  Problem Relation Age of Onset  . Cancer Mother     lung and colon  . Stroke Sister     History   Social History  . Marital Status: Married    Spouse Name: N/A    Number of Children: 1  . Years of Education: N/A   Occupational History  . Diability    Social History Main Topics  . Smoking status: Never Smoker   . Smokeless tobacco: Never Used  . Alcohol Use: No  . Drug Use: No  . Sexual Activity: Not on file   Other Topics Concern  . Not on file   Social History Narrative   Lives in [place], one adult child      Caffeine use: coffee daily in am    Allergies  Allergen Reactions  . Ace Inhibitors     Angioedema   . Erythromycin     REACTION: Nausea and vomiting  . Invokana [Canagliflozin] Other (See Comments)    Yeast infection     Constitutional: Positive headache, fatigue. Denies fever.  HEENT:  Positive runny nose, sore throat. Denies eye redness, eye pain, pressure behind the eyes, facial pain, nasal congestion, ear pain or bloody  nose. Respiratory: Positive cough and shortness of breath. Denies difficulty breathing or shortness of breath.  Cardiovascular: Denies chest pain, chest tightness, palpitations or swelling in the hands or feet.  GI: Pt reports diarrhea. Denies nausea, vomiting or blood in her stool.   No other specific complaints in a complete review of systems (except as listed in HPI above).  Objective:     Wt Readings from Last 3 Encounters:  10/02/14 368 lb (166.924 kg)  09/30/14 373 lb (169.192 kg)  09/11/14 378 lb (171.46 kg)     General: Appears her stated age, chronically ill appearing in NAD. HEENT: Head: normal shape and size; Ears: Tm's gray and intact, normal light reflex; Nose: mucosa pink and moist, septum midline; Throat/Mouth: + PND. Teeth present, mucosa erythematous and moist, no exudate noted, no lesions or ulcerations noted. No adenopathy noted. Cardiovascular: Tachycardic with normal rhythm. Distant S1,S2 noted.  No murmur, rubs or gallops noted. Pulmonary/Chest: Normal effort and intermittent expiratory wheeze noted on the right. No respiratory distress. No rales or ronchi noted.  Abdomen: Soft, nontender, active bowel sounds.    Assessment & Plan:   Upper Respiratory Infection with Cough:  Get some rest and  drink plenty of water Do salt water gargles for the sore throat eRx for Azithromax x 5 days Delsym OTC for cough  RTC as needed or if symptoms persist.

## 2014-10-13 ENCOUNTER — Other Ambulatory Visit: Payer: Self-pay | Admitting: *Deleted

## 2014-10-13 MED ORDER — METFORMIN HCL 1000 MG PO TABS: 1000.0000 mg | ORAL_TABLET | Freq: Two times a day (BID) | ORAL | Status: DC

## 2014-10-13 MED ORDER — SYNTHROID 175 MCG PO TABS: 175.0000 ug | ORAL_TABLET | Freq: Every day | ORAL | Status: DC

## 2014-10-20 ENCOUNTER — Ambulatory Visit: Payer: Medicare Other | Admitting: Internal Medicine

## 2014-10-30 ENCOUNTER — Encounter: Payer: Self-pay | Admitting: Registered Nurse

## 2014-10-30 ENCOUNTER — Encounter (HOSPITAL_BASED_OUTPATIENT_CLINIC_OR_DEPARTMENT_OTHER): Payer: Medicare Other | Admitting: Registered Nurse

## 2014-10-30 VITALS — BP 153/70 | HR 86 | Resp 14

## 2014-10-30 DIAGNOSIS — M25562 Pain in left knee: Secondary | ICD-10-CM

## 2014-10-30 DIAGNOSIS — M25561 Pain in right knee: Secondary | ICD-10-CM | POA: Diagnosis not present

## 2014-10-30 DIAGNOSIS — Z5181 Encounter for therapeutic drug level monitoring: Secondary | ICD-10-CM | POA: Diagnosis not present

## 2014-10-30 DIAGNOSIS — G8929 Other chronic pain: Secondary | ICD-10-CM | POA: Diagnosis not present

## 2014-10-30 DIAGNOSIS — M174 Other bilateral secondary osteoarthritis of knee: Secondary | ICD-10-CM

## 2014-10-30 DIAGNOSIS — Z79899 Other long term (current) drug therapy: Secondary | ICD-10-CM | POA: Diagnosis not present

## 2014-10-30 DIAGNOSIS — G894 Chronic pain syndrome: Secondary | ICD-10-CM

## 2014-10-30 DIAGNOSIS — M5137 Other intervertebral disc degeneration, lumbosacral region: Secondary | ICD-10-CM

## 2014-10-30 MED ORDER — MORPHINE SULFATE ER 30 MG PO TBCR: 30.0000 mg | EXTENDED_RELEASE_TABLET | Freq: Two times a day (BID) | ORAL | Status: DC

## 2014-10-30 MED ORDER — HYDROCODONE-ACETAMINOPHEN 10-325 MG PO TABS: 1.0000 | ORAL_TABLET | Freq: Three times a day (TID) | ORAL | Status: DC

## 2014-10-30 NOTE — Progress Notes (Signed)
Subjective:    Patient ID: Theresa Huffman, female    DOB: 07/22/1946, 68 y.o.   MRN: [id number]  HPI: Theresa Huffman is a 68 year old female who returns for follow up for chronic pain and medication refill. She says her pain is in her lower back and bilateral knees right >left. She rates her pain 8. Her current exercise regime is walking in her home short distances and performing chair exercises.  Pain Inventory Average Pain 8 Pain Right Now 8 My pain is burning and aching  In the last 24 hours, has pain interfered with the following? General activity 7 Relation with others 8 Enjoyment of life 8 What TIME of day is your pain at its worst? morning, evening Sleep (in general) Poor  Pain is worse with: walking, bending and standing Pain improves with: rest and medication Relief from Meds: 6  Mobility walk with assistance how many minutes can you walk? 10 ability to climb steps?  no do you drive?  yes use a wheelchair Do you have any goals in this area?  yes  Function disabled: date disabled . I need assistance with the following:  meal prep, household duties and shopping  Neuro/Psych weakness trouble walking depression  Prior Studies Any changes since last visit?  no  Physicians involved in your care Any changes since last visit?  no   Family History  Problem Relation Age of Onset  . Cancer Mother     lung and colon  . Stroke Sister    History   Social History  . Marital Status: Married    Spouse Name: N/A    Number of Children: 1  . Years of Education: N/A   Occupational History  . Diability    Social History Main Topics  . Smoking status: Never Smoker   . Smokeless tobacco: Never Used  . Alcohol Use: No  . Drug Use: No  . Sexual Activity: None   Other Topics Concern  . None   Social History Narrative   Lives in [place], one adult child      Caffeine use: coffee daily in am   Past Surgical History  Procedure Laterality  Date  . Cholecystectomy    . Tubal ligation     Past Medical History  Diagnosis Date  . Hyperlipidemia   . Hypertension   . Low back pain   . Morbid obesity   . Diabetes mellitus     type II  . Thyroid disease     hypothyroidism  . GERD (gastroesophageal reflux disease)   . Lymphedema   . Lumbosacral spondylosis without myelopathy   . Pain in limb   . Lumbago   . Facet syndrome, lumbar   . Lymphedema   . Primary localized osteoarthrosis, lower leg   . Hypercholesteremia    BP 153/70 mmHg  Pulse 86  Resp 14  SpO2 90%  Opioid Risk Score:   Fall Risk Score: Moderate Fall Risk (6-13 points) Review of Systems  Musculoskeletal: Positive for gait problem.  Neurological: Positive for weakness.  Psychiatric/Behavioral: Positive for dysphoric mood.  All other systems reviewed and are negative.      Objective:   Physical Exam  Constitutional: She is oriented to person, place, and time. She appears well-developed and well-nourished.  HENT:  Head: Normocephalic and atraumatic.  Neck: Normal range of motion. Neck supple.  Cardiovascular: Normal rate and regular rhythm.   Pulmonary/Chest: Effort normal and breath sounds normal.  Musculoskeletal:  Normal Muscle Bulk and Muscle Testing Reveals: Upper Extremities: Full ROM and Muscle strength 5/5 Lumbar Paraspinal Tenderness: L-3- L-5 Lower extremities: Full ROM and Muscle strength 5/5 Bilateral Lower extremities Flexion Produces pain into Patella's Arises from chair with slight difficulty Using Loftstrand crutches   Neurological: She is alert and oriented to person, place, and time.  Skin: Skin is warm and dry.  Psychiatric: She has a normal mood and affect.  Nursing note and vitals reviewed.         Assessment & Plan:   1. Lumbar pain chronic, no radiculopathy:  Lumbar degenerative disc L4-5 and L5-S1.  Refilled: HYDRcodone 10/325mg  one tablet TID #90.  MS CONTIN 30 mg one every 12 hours #60.  2. Bilateral  knee osteoarthritis: Continue with Exercise and Heat therapy. Continue  current medication regime.   20 minutes of face to face patient care time was spent during this visit. All questions were encouraged and answered.   F/U in 1 month

## 2014-11-08 ENCOUNTER — Other Ambulatory Visit: Payer: Self-pay | Admitting: Family Medicine

## 2014-11-16 ENCOUNTER — Other Ambulatory Visit: Payer: Self-pay | Admitting: Family Medicine

## 2014-11-26 ENCOUNTER — Telehealth: Payer: Self-pay | Admitting: Internal Medicine

## 2014-11-26 ENCOUNTER — Other Ambulatory Visit: Payer: Self-pay | Admitting: *Deleted

## 2014-11-26 MED ORDER — GLIPIZIDE 10 MG PO TABS: 10.0000 mg | ORAL_TABLET | Freq: Two times a day (BID) | ORAL | Status: DC

## 2014-11-26 NOTE — Telephone Encounter (Signed)
Pts rx for glipizide is supposed to be written for 2 times daily please adjust as you see fit

## 2014-12-02 ENCOUNTER — Encounter: Payer: Self-pay | Admitting: Registered Nurse

## 2014-12-02 ENCOUNTER — Encounter: Payer: Medicare Other | Attending: Physical Medicine & Rehabilitation | Admitting: Registered Nurse

## 2014-12-02 VITALS — BP 182/90 | HR 99 | Resp 18 | Wt 375.2 lb

## 2014-12-02 DIAGNOSIS — M7541 Impingement syndrome of right shoulder: Secondary | ICD-10-CM | POA: Insufficient documentation

## 2014-12-02 DIAGNOSIS — G894 Chronic pain syndrome: Secondary | ICD-10-CM

## 2014-12-02 DIAGNOSIS — Z5181 Encounter for therapeutic drug level monitoring: Secondary | ICD-10-CM | POA: Insufficient documentation

## 2014-12-02 DIAGNOSIS — Z79899 Other long term (current) drug therapy: Secondary | ICD-10-CM | POA: Diagnosis not present

## 2014-12-02 DIAGNOSIS — G8929 Other chronic pain: Secondary | ICD-10-CM | POA: Diagnosis not present

## 2014-12-02 DIAGNOSIS — M5137 Other intervertebral disc degeneration, lumbosacral region: Secondary | ICD-10-CM

## 2014-12-02 DIAGNOSIS — M25562 Pain in left knee: Secondary | ICD-10-CM | POA: Insufficient documentation

## 2014-12-02 DIAGNOSIS — M75102 Unspecified rotator cuff tear or rupture of left shoulder, not specified as traumatic: Secondary | ICD-10-CM | POA: Insufficient documentation

## 2014-12-02 DIAGNOSIS — M25561 Pain in right knee: Secondary | ICD-10-CM

## 2014-12-02 MED ORDER — MORPHINE SULFATE ER 30 MG PO TBCR: 30.0000 mg | EXTENDED_RELEASE_TABLET | Freq: Two times a day (BID) | ORAL | Status: DC

## 2014-12-02 MED ORDER — HYDROCODONE-ACETAMINOPHEN 10-325 MG PO TABS: 1.0000 | ORAL_TABLET | Freq: Three times a day (TID) | ORAL | Status: DC

## 2014-12-02 NOTE — Progress Notes (Signed)
Subjective:    Patient ID: Theresa Huffman, female    DOB: 06-03-1946, 69 y.o.   MRN: [id number] HPI: [name] is a 69 year old female who returns for follow up for chronic pain and medication refill. She says her pain is in her lower back and right knee. She rates her pain 8. Her current exercise regime is walking short distances and performing chair exercises.  Pain Inventory Average Pain 8 Pain Right Now 8 My pain is burning and aching  In the last 24 hours, has pain interfered with the following? General activity 9 Relation with others 7 Enjoyment of life 9 What TIME of day is your pain at its worst? morning and daytime Sleep (in general) Fair  Pain is worse with: some activites Pain improves with: medication Relief from Meds: na  Mobility walk without assistance walk with assistance how many minutes can you walk? 10 ability to climb steps?  no do you drive?  yes  Function disabled: date disabled .  Neuro/Psych weakness  Prior Studies Any changes since last visit?  no  Physicians involved in your care Any changes since last visit?  no   Family History  Problem Relation Age of Onset  . Cancer Mother     lung and colon  . Stroke Sister    History   Social History  . Marital Status: Married    Spouse Name: N/A    Number of Children: 1  . Years of Education: N/A   Occupational History  . Diability    Social History Main Topics  . Smoking status: Never Smoker   . Smokeless tobacco: Never Used  . Alcohol Use: No  . Drug Use: No  . Sexual Activity: None   Other Topics Concern  . None   Social History Narrative   Lives in [place], one adult child      Caffeine use: coffee daily in am   Past Surgical History  Procedure Laterality Date  . Cholecystectomy    . Tubal ligation     Past Medical History  Diagnosis Date  . Hyperlipidemia   . Hypertension   . Low back pain   . Morbid obesity   . Diabetes mellitus    type II  . Thyroid disease     hypothyroidism  . GERD (gastroesophageal reflux disease)   . Lymphedema   . Lumbosacral spondylosis without myelopathy   . Pain in limb   . Lumbago   . Facet syndrome, lumbar   . Lymphedema   . Primary localized osteoarthrosis, lower leg   . Hypercholesteremia    BP 182/90 mmHg  Pulse 99  Resp 18  Wt 375 lb 3.2 oz (170.19 kg)  SpO2 90%  Opioid Risk Score:   Fall Risk Score: Moderate Fall Risk (6-13 points) (previously educated and given handout)  Review of Systems  Musculoskeletal: Positive for gait problem.  Neurological: Positive for weakness.  Psychiatric/Behavioral: Positive for dysphoric mood.  All other systems reviewed and are negative.      Objective:   Physical Exam  Constitutional: She is oriented to person, place, and time. She appears well-developed and well-nourished.  HENT:  Head: Normocephalic and atraumatic.  Neck: Normal range of motion. Neck supple.  Cardiovascular: Normal rate and regular rhythm.   Pulmonary/Chest: Effort normal and breath sounds normal.  Musculoskeletal:  Normal Muscle Bulk and Muscle Testing Reveals: Upper Extremities: Full ROM and Muscle strength 5/5 Lumbar Paraspinal Tenderness: L-3- L-5 Lower Extremities: Full  ROM and Muscle Strength 5/5 Arises from chair slowly using Loftstrand crutches Narrow Based Gait  Neurological: She is alert and oriented to person, place, and time.  Skin: Skin is warm and dry.  Psychiatric: She has a normal mood and affect.  Nursing note and vitals reviewed.         Assessment & Plan:  1. Lumbar pain chronic, no radiculopathy: Lumbar degenerative disc L4-5 and L5-S1.  Refilled: HYDRcodone 10/325mg  one tablet TID #90.  MS CONTIN 30 mg one every 12 hours #60.  2. Bilateral knee osteoarthritis: Continue with Exercise and Heat therapy. Continue current medication regime.   20 minutes of face to face patient care time was spent during this visit. All  questions were encouraged and answered.   F/U in 1 month

## 2014-12-05 ENCOUNTER — Other Ambulatory Visit: Payer: Self-pay | Admitting: *Deleted

## 2014-12-05 MED ORDER — POTASSIUM CHLORIDE CRYS ER 20 MEQ PO TBCR: 20.0000 meq | EXTENDED_RELEASE_TABLET | Freq: Every day | ORAL | Status: DC

## 2014-12-05 MED ORDER — SIMVASTATIN 20 MG PO TABS: ORAL_TABLET | ORAL | Status: DC

## 2014-12-05 MED ORDER — AMLODIPINE BESYLATE 5 MG PO TABS: ORAL_TABLET | ORAL | Status: DC

## 2014-12-05 MED ORDER — FUROSEMIDE 40 MG PO TABS: 40.0000 mg | ORAL_TABLET | Freq: Two times a day (BID) | ORAL | Status: DC

## 2014-12-05 NOTE — Telephone Encounter (Signed)
Received refill request from [facility] for 90 day rx's on Glipizide, KCL, Gabapentin, IBP, Simvastatin, Amlodipine, and Furosemide. I authorized refills on KCL, Simvastatin, Amlodipine, and Furosemide, but since her Gabapentin, and IBP had previously been refilled by Dr. Letta Pate, and her Glipizide by Dr. Cruzita Lederer I advised pharmacy to contact those Dr's for those refills.

## 2014-12-05 NOTE — Telephone Encounter (Signed)
Thank you.  I agree with above.

## 2014-12-31 ENCOUNTER — Encounter: Payer: Self-pay | Admitting: Registered Nurse

## 2014-12-31 ENCOUNTER — Encounter: Payer: Medicare Other | Attending: Physical Medicine & Rehabilitation | Admitting: Registered Nurse

## 2014-12-31 VITALS — BP 148/80 | HR 102 | Resp 20

## 2014-12-31 DIAGNOSIS — Z5181 Encounter for therapeutic drug level monitoring: Secondary | ICD-10-CM | POA: Diagnosis not present

## 2014-12-31 DIAGNOSIS — M25562 Pain in left knee: Secondary | ICD-10-CM | POA: Insufficient documentation

## 2014-12-31 DIAGNOSIS — M75102 Unspecified rotator cuff tear or rupture of left shoulder, not specified as traumatic: Secondary | ICD-10-CM | POA: Insufficient documentation

## 2014-12-31 DIAGNOSIS — Z79899 Other long term (current) drug therapy: Secondary | ICD-10-CM

## 2014-12-31 DIAGNOSIS — M25561 Pain in right knee: Secondary | ICD-10-CM | POA: Diagnosis not present

## 2014-12-31 DIAGNOSIS — M5137 Other intervertebral disc degeneration, lumbosacral region: Secondary | ICD-10-CM

## 2014-12-31 DIAGNOSIS — G8929 Other chronic pain: Secondary | ICD-10-CM | POA: Insufficient documentation

## 2014-12-31 DIAGNOSIS — G894 Chronic pain syndrome: Secondary | ICD-10-CM

## 2014-12-31 DIAGNOSIS — M7541 Impingement syndrome of right shoulder: Secondary | ICD-10-CM

## 2014-12-31 MED ORDER — HYDROCODONE-ACETAMINOPHEN 10-325 MG PO TABS: 1.0000 | ORAL_TABLET | Freq: Three times a day (TID) | ORAL | Status: DC

## 2014-12-31 MED ORDER — MORPHINE SULFATE ER 30 MG PO TBCR: 30.0000 mg | EXTENDED_RELEASE_TABLET | Freq: Two times a day (BID) | ORAL | Status: DC

## 2014-12-31 MED ORDER — BACLOFEN 10 MG PO TABS: 10.0000 mg | ORAL_TABLET | Freq: Three times a day (TID) | ORAL | Status: DC

## 2014-12-31 NOTE — Progress Notes (Signed)
Subjective:    Patient ID: Theresa Huffman, female    DOB: 10-19-1946, 69 y.o.   MRN: [id number]  HPI: [name] is a 69 year old female who returns for follow up for chronic pain and medication refill. She says her pain is in her neck, bilateral shoulders, lower back and left knee. She rates her pain 8. Her current exercise regime is walking short distances and performing chair exercises.  Pain Inventory Average Pain 8 Pain Right Now 8 My pain is burning and aching  In the last 24 hours, has pain interfered with the following? General activity 9 Relation with others 7 Enjoyment of life 7 What TIME of day is your pain at its worst? morning Sleep (in general) Poor  Pain is worse with: walking, bending and standing Pain improves with: rest and medication Relief from Meds: 6  Mobility walk with assistance how many minutes can you walk? 10 ability to climb steps?  no do you drive?  yes  Function disabled: date disabled .  Neuro/Psych weakness trouble walking depression  Prior Studies Any changes since last visit?  no  Physicians involved in your care Any changes since last visit?  no   Family History  Problem Relation Age of Onset  . Cancer Mother     lung and colon  . Stroke Sister    History   Social History  . Marital Status: Married    Spouse Name: N/A  . Number of Children: 1  . Years of Education: N/A   Occupational History  . Diability    Social History Main Topics  . Smoking status: Never Smoker   . Smokeless tobacco: Never Used  . Alcohol Use: No  . Drug Use: No  . Sexual Activity: Not on file   Other Topics Concern  . None   Social History Narrative   Lives in [place], one adult child      Caffeine use: coffee daily in am   Past Surgical History  Procedure Laterality Date  . Cholecystectomy    . Tubal ligation     Past Medical History  Diagnosis Date  . Hyperlipidemia   . Hypertension   . Low back pain    . Morbid obesity   . Diabetes mellitus     type II  . Thyroid disease     hypothyroidism  . GERD (gastroesophageal reflux disease)   . Lymphedema   . Lumbosacral spondylosis without myelopathy   . Pain in limb   . Lumbago   . Facet syndrome, lumbar   . Lymphedema   . Primary localized osteoarthrosis, lower leg   . Hypercholesteremia    BP 148/80 mmHg  Pulse 102  Resp 20  SpO2 91%  Opioid Risk Score:   Fall Risk Score: Moderate Fall Risk (6-13 points) (previoulsy edcuated and given handout) Review of Systems  Musculoskeletal: Positive for gait problem.  Neurological: Positive for weakness.  Psychiatric/Behavioral: Positive for dysphoric mood.  All other systems reviewed and are negative.      Objective:   Physical Exam  Constitutional: She is oriented to person, place, and time. She appears well-developed and well-nourished.  Morbid Obese  HENT:  Head: Normocephalic and atraumatic.  Neck: Normal range of motion. Neck supple.  Cardiovascular: Normal rate and regular rhythm.   Pulmonary/Chest: She has wheezes.  Musculoskeletal:  Normal Muscle Bulk and Muscle Testing Reveals: Upper Extremities: Full ROM and Muscle Strength 5/5 Bilateral AC Joint Tenderness Thoracic Paraspinal Tenderness: T-8-  T-9 (  Right Side) Lumbar Paraspinal Tenderness: L-3- L-5 Lower Extremities: Full ROM and Muscle Strength 5/5 Bilateral Lower Extremities Flexion Produces Painto into Patella's Arises from chair with ease/ using Lofstrand crutches    Neurological: She is alert and oriented to person, place, and time.  Skin: Skin is warm and dry.  Nursing note and vitals reviewed.         Assessment & Plan:  1. Lumbar pain chronic, no radiculopathy: Lumbar degenerative disc L4-5 and L5-S1.  Refilled: HYDRcodone 10/325mg  one tablet TID #90.  MS CONTIN 30 mg one every 12 hours #60. Second script given to accommodate appointment with Dr. Letta Pate. 2. Bilateral knee osteoarthritis:  Continue with Exercise and Heat therapy. Continue current medication regime.   20 minutes of face to face patient care time was spent during this visit. All questions were encouraged and answered.   F/U in 1 month

## 2015-01-01 ENCOUNTER — Other Ambulatory Visit: Payer: Self-pay | Admitting: Physical Medicine & Rehabilitation

## 2015-01-01 DIAGNOSIS — M5137 Other intervertebral disc degeneration, lumbosacral region: Secondary | ICD-10-CM | POA: Diagnosis not present

## 2015-01-01 DIAGNOSIS — M25562 Pain in left knee: Secondary | ICD-10-CM | POA: Diagnosis not present

## 2015-01-01 DIAGNOSIS — G8929 Other chronic pain: Secondary | ICD-10-CM | POA: Diagnosis not present

## 2015-01-01 DIAGNOSIS — Z79899 Other long term (current) drug therapy: Secondary | ICD-10-CM | POA: Diagnosis not present

## 2015-01-01 DIAGNOSIS — M7541 Impingement syndrome of right shoulder: Secondary | ICD-10-CM | POA: Diagnosis not present

## 2015-01-01 DIAGNOSIS — G894 Chronic pain syndrome: Secondary | ICD-10-CM | POA: Diagnosis not present

## 2015-01-01 DIAGNOSIS — Z5181 Encounter for therapeutic drug level monitoring: Secondary | ICD-10-CM | POA: Diagnosis not present

## 2015-01-01 DIAGNOSIS — M25561 Pain in right knee: Secondary | ICD-10-CM | POA: Diagnosis not present

## 2015-01-01 NOTE — Addendum Note (Signed)
Addended by: Geryl Rankins D on: 01/01/2015 01:49 PM   Modules accepted: Orders

## 2015-01-02 ENCOUNTER — Telehealth: Payer: Self-pay | Admitting: *Deleted

## 2015-01-02 LAB — PMP ALCOHOL METABOLITE (ETG): ETGU: NEGATIVE ng/mL

## 2015-01-02 NOTE — Telephone Encounter (Signed)
Recd letter from [employer] stating that the appeal was denied for coverage of the Methocarbamol.  Will need to switch to Zanaflex which is covered by insurance

## 2015-01-06 LAB — PRESCRIPTION MONITORING PROFILE (SOLSTAS)
Amphetamine/Meth: NEGATIVE ng/mL
Barbiturate Screen, Urine: NEGATIVE ng/mL
Benzodiazepine Screen, Urine: NEGATIVE ng/mL
Buprenorphine, Urine: NEGATIVE ng/mL
CANNABINOID SCRN UR: NEGATIVE ng/mL
CREATININE, URINE: 60.41 mg/dL (ref >20.0)
Carisoprodol, Urine: NEGATIVE ng/mL
Cocaine Metabolites: NEGATIVE ng/mL
Fentanyl, Ur: NEGATIVE ng/mL
MDMA URINE: NEGATIVE ng/mL
MEPERIDINE UR: NEGATIVE ng/mL
Methadone Screen, Urine: NEGATIVE ng/mL
Nitrites, Initial: NEGATIVE ug/mL
Oxycodone Screen, Ur: NEGATIVE ng/mL
Propoxyphene: NEGATIVE ng/mL
TAPENTADOLUR: NEGATIVE ng/mL
TRAMADOL UR: NEGATIVE ng/mL
Zolpidem, Urine: NEGATIVE ng/mL
pH, Initial: 5.3 pH (ref 4.5–8.9)

## 2015-01-06 LAB — OPIATES/OPIOIDS (LC/MS-MS)
Codeine Urine: NEGATIVE ng/mL (ref <50)
HYDROMORPHONE: 230 ng/mL (ref <50)
Hydrocodone: 1236 ng/mL (ref <50)
Morphine Urine: 12216 ng/mL (ref <50)
Norhydrocodone, Ur: 647 ng/mL (ref <50)
Noroxycodone, Ur: NEGATIVE ng/mL (ref <50)
OXYCODONE, UR: NEGATIVE ng/mL (ref <50)
OXYMORPHONE, URINE: NEGATIVE ng/mL (ref <50)

## 2015-01-16 NOTE — Progress Notes (Signed)
Urine drug screen for this encounter is consistent for prescribed medication 

## 2015-01-31 ENCOUNTER — Other Ambulatory Visit: Payer: Self-pay | Admitting: Family Medicine

## 2015-01-31 ENCOUNTER — Other Ambulatory Visit: Payer: Self-pay | Admitting: Physical Medicine & Rehabilitation

## 2015-02-03 ENCOUNTER — Other Ambulatory Visit: Payer: Self-pay | Admitting: *Deleted

## 2015-02-03 MED ORDER — BACLOFEN 10 MG PO TABS: 10.0000 mg | ORAL_TABLET | Freq: Three times a day (TID) | ORAL | Status: DC

## 2015-02-07 ENCOUNTER — Other Ambulatory Visit: Payer: Self-pay | Admitting: Physical Medicine & Rehabilitation

## 2015-02-09 ENCOUNTER — Ambulatory Visit (HOSPITAL_BASED_OUTPATIENT_CLINIC_OR_DEPARTMENT_OTHER): Payer: Medicare Other | Admitting: Physical Medicine & Rehabilitation

## 2015-02-09 ENCOUNTER — Encounter: Payer: Medicare Other | Attending: Physical Medicine & Rehabilitation

## 2015-02-09 ENCOUNTER — Encounter: Payer: Self-pay | Admitting: Physical Medicine & Rehabilitation

## 2015-02-09 VITALS — BP 188/93 | HR 110 | Resp 14

## 2015-02-09 DIAGNOSIS — Z5181 Encounter for therapeutic drug level monitoring: Secondary | ICD-10-CM | POA: Diagnosis not present

## 2015-02-09 DIAGNOSIS — M25561 Pain in right knee: Secondary | ICD-10-CM | POA: Insufficient documentation

## 2015-02-09 DIAGNOSIS — M25562 Pain in left knee: Secondary | ICD-10-CM | POA: Insufficient documentation

## 2015-02-09 DIAGNOSIS — Z79899 Other long term (current) drug therapy: Secondary | ICD-10-CM | POA: Insufficient documentation

## 2015-02-09 DIAGNOSIS — M75102 Unspecified rotator cuff tear or rupture of left shoulder, not specified as traumatic: Secondary | ICD-10-CM | POA: Diagnosis not present

## 2015-02-09 DIAGNOSIS — M25511 Pain in right shoulder: Secondary | ICD-10-CM | POA: Diagnosis not present

## 2015-02-09 DIAGNOSIS — G8929 Other chronic pain: Secondary | ICD-10-CM | POA: Insufficient documentation

## 2015-02-09 DIAGNOSIS — M7541 Impingement syndrome of right shoulder: Secondary | ICD-10-CM | POA: Insufficient documentation

## 2015-02-09 DIAGNOSIS — M25512 Pain in left shoulder: Secondary | ICD-10-CM

## 2015-02-09 MED ORDER — MORPHINE SULFATE ER 30 MG PO TBCR: 30.0000 mg | EXTENDED_RELEASE_TABLET | Freq: Two times a day (BID) | ORAL | Status: DC

## 2015-02-09 MED ORDER — HYDROCODONE-ACETAMINOPHEN 10-325 MG PO TABS: 1.0000 | ORAL_TABLET | Freq: Three times a day (TID) | ORAL | Status: DC

## 2015-02-09 NOTE — Progress Notes (Signed)
Subjective:    Patient ID: Theresa Huffman, female    DOB: 08-23-1946, 69 y.o.   MRN: [id number]  HPI Chief complaint is shoulder pain greater than knee pain. Patient is a 69 year old female with history of bilateral knee pain, she is been ambulating with Loftstrand crutches for short distances but for any longer distances she is in a power chair. In fact she is in the power chair a lot at home as well. Her shoulders have been bothering her more since she's been using Loftstrand crutches.  She denies any fall or any other trauma She's had cortisone injections in her knee which used to help but the last set of injections were not helpful for more than a few days. She has never tried Synvisc injections.  Pain Inventory Average Pain 8 Pain Right Now 8 My pain is burning and aching  In the last 24 hours, has pain interfered with the following? General activity 9 Relation with others 7 Enjoyment of life 7 What TIME of day is your pain at its worst? morning, evening Sleep (in general) Fair  Pain is worse with: walking, bending and standing Pain improves with: rest and medication Relief from Meds: 6  Mobility walk with assistance how many minutes can you walk? 10 use a wheelchair Do you have any goals in this area?  yes  Function disabled: date disabled . Do you have any goals in this area?  no  Neuro/Psych weakness trouble walking depression  Prior Studies Any changes since last visit?  no  Physicians involved in your care Any changes since last visit?  no   Family History  Problem Relation Age of Onset  . Cancer Mother     lung and colon  . Stroke Sister    History   Social History  . Marital Status: Married    Spouse Name: N/A  . Number of Children: 1  . Years of Education: N/A   Occupational History  . Diability    Social History Main Topics  . Smoking status: Never Smoker   . Smokeless tobacco: Never Used  . Alcohol Use: No  . Drug Use: No  .  Sexual Activity: Not on file   Other Topics Concern  . None   Social History Narrative   Lives in [place], one adult child      Caffeine use: coffee daily in am   Past Surgical History  Procedure Laterality Date  . Cholecystectomy    . Tubal ligation     Past Medical History  Diagnosis Date  . Hyperlipidemia   . Hypertension   . Low back pain   . Morbid obesity   . Diabetes mellitus     type II  . Thyroid disease     hypothyroidism  . GERD (gastroesophageal reflux disease)   . Lymphedema   . Lumbosacral spondylosis without myelopathy   . Pain in limb   . Lumbago   . Facet syndrome, lumbar   . Lymphedema   . Primary localized osteoarthrosis, lower leg   . Hypercholesteremia    BP 188/93 mmHg  Pulse 110  Resp 14  SpO2 90%  Opioid Risk Score:   Fall Risk Score: Moderate Fall Risk (6-13 points)`1  Depression screen PHQ 2/9  No flowsheet data found.   Review of Systems  Musculoskeletal: Positive for gait problem.  Neurological: Positive for weakness.  Psychiatric/Behavioral: Positive for dysphoric mood.  All other systems reviewed and are negative.      Objective:  Physical Exam  Musculoskeletal:       Right shoulder: She exhibits tenderness. She exhibits normal range of motion and no deformity.       Left shoulder: She exhibits tenderness. She exhibits normal range of motion and no deformity.       Right knee: She exhibits decreased range of motion. Tenderness found. Lateral joint line and patellar tendon tenderness noted. No medial joint line tenderness noted.       Left knee: She exhibits decreased range of motion. Tenderness found. Lateral joint line tenderness noted. No medial joint line and no patellar tendon tenderness noted.   Pain with all range of motion of both shoulders but she does have full range of motion of bilateral shoulders.Positive impingement sign       Assessment & Plan:  1. Chronic bilateral knee pain, history of  osteoarthritis but no recent x-rays. She has never had Synvisc injections. We'll check x-rays if it is severe it is unlikely to be helpful. If his moderate then it is worth doing under ultrasound guidance given her morbid obesity.  Continue morphine sulfate extended release 30 movements every 12 hours Continue hydrocodone 10 mg 3 times a day  Continue opioid monitoring program. This consists of regular clinic visits, examinations, urine drug screen, pill counts as well as use of [place] controlled substance reporting System.  No evidence of narcotic analgesic misuse.  2. Chronic shoulder pain exam is nonfocal. She has some impingement signs however she has pain with basically all range of motion however her range of motion is not restricted and she has no evidence of crepitus. We will check x-rays of her shoulders to further assess. If negative consider subacromial bursa injection versus subscapular nerve block. Also may consider shoulder ultrasound however may be difficult secondary to her obesity

## 2015-02-09 NOTE — Patient Instructions (Signed)
Have ordered x-rays of the shoulders and knees Based on results may benefit from Synvisc injection for the knees Consider nerve blocks or shoulder joint injection or shoulder pain

## 2015-03-05 ENCOUNTER — Other Ambulatory Visit: Payer: Self-pay | Admitting: Family Medicine

## 2015-03-10 ENCOUNTER — Ambulatory Visit (INDEPENDENT_AMBULATORY_CARE_PROVIDER_SITE_OTHER): Payer: Medicare Other | Admitting: Family Medicine

## 2015-03-10 ENCOUNTER — Encounter: Payer: Self-pay | Admitting: Family Medicine

## 2015-03-10 VITALS — BP 148/90 | HR 129 | Temp 98.2°F | Wt 352.5 lb

## 2015-03-10 DIAGNOSIS — J209 Acute bronchitis, unspecified: Secondary | ICD-10-CM

## 2015-03-10 DIAGNOSIS — R197 Diarrhea, unspecified: Secondary | ICD-10-CM

## 2015-03-10 DIAGNOSIS — E1149 Type 2 diabetes mellitus with other diabetic neurological complication: Secondary | ICD-10-CM

## 2015-03-10 DIAGNOSIS — E1141 Type 2 diabetes mellitus with diabetic mononeuropathy: Secondary | ICD-10-CM

## 2015-03-10 DIAGNOSIS — E1140 Type 2 diabetes mellitus with diabetic neuropathy, unspecified: Secondary | ICD-10-CM | POA: Diagnosis not present

## 2015-03-10 DIAGNOSIS — E039 Hypothyroidism, unspecified: Secondary | ICD-10-CM

## 2015-03-10 DIAGNOSIS — IMO0002 Reserved for concepts with insufficient information to code with codable children: Secondary | ICD-10-CM

## 2015-03-10 DIAGNOSIS — E1165 Type 2 diabetes mellitus with hyperglycemia: Secondary | ICD-10-CM

## 2015-03-10 LAB — T4, FREE: Free T4: 1.09 ng/dL (ref 0.60–1.60)

## 2015-03-10 LAB — TSH: TSH: 5.83 u[IU]/mL — ABNORMAL HIGH (ref 0.35–4.50)

## 2015-03-10 LAB — HEMOGLOBIN A1C: Hgb A1c MFr Bld: 9.2 % — ABNORMAL HIGH (ref 4.6–6.5)

## 2015-03-10 MED ORDER — METOCLOPRAMIDE HCL 5 MG PO TABS: 5.0000 mg | ORAL_TABLET | Freq: Three times a day (TID) | ORAL | Status: DC

## 2015-03-10 MED ORDER — ALBUTEROL SULFATE HFA 108 (90 BASE) MCG/ACT IN AERS: 2.0000 | INHALATION_SPRAY | RESPIRATORY_TRACT | Status: DC | PRN

## 2015-03-10 MED ORDER — AZITHROMYCIN 250 MG PO TABS: ORAL_TABLET | ORAL | Status: DC

## 2015-03-10 NOTE — Assessment & Plan Note (Signed)
Poorly controlled. Check labs today.

## 2015-03-10 NOTE — Progress Notes (Signed)
Subjective:   Patient ID: Theresa Huffman, female    DOB: 09/06/1946, 69 y.o.   MRN: [id number]  Theresa Huffman is a pleasant 69 y.o. year old female who presents to clinic today with Cough and Wheezing  and "several other issues" on 03/10/2015  HPI:  Cough/congestion- 5 days of worsening productive cough, wheezing and SOB.  No fevers but has had chills. Non smoker.    Diarrhea- chronic issue, seems to be getting worse.  Takes immodium several times a day which does help somewhat but still having diarrhea.  "runs through me."  Has refused colonoscopies and does not come to office for preventative office visits.  DM- not well controlled.  Has not been compliant with endo recommendations or  follow up (was seeing Dr. Cruzita Lederer).  Taking glucotrol 10 mg twice daily and Metformin  1000 mg twice daily.  Lab Results  Component Value Date   HGBA1C 8.8* 10/02/2014   Hypothyroidism- takes synthroid 175 mg daily. Due for labs. Lab Results  Component Value Date   TSH 2.08 04/18/2013   Denies any symptoms of hypo or hyperthyroidism.  Current Outpatient Prescriptions on File Prior to Visit  Medication Sig Dispense Refill  . amLODipine (NORVASC) 5 MG tablet TAKE 1 TABLET BY MOUTH DAILY - NEED TO BE SEEN FOR FURTHER REFILLS 90 tablet 0  . aspirin EC 81 MG tablet Take 162 mg by mouth daily.    . baclofen (LIORESAL) 10 MG tablet Take 1 tablet (10 mg total) by mouth 3 (three) times daily. 90 tablet 2  . Calcium Carbonate-Vitamin D 600-400 MG-UNIT per tablet Take 1 tablet by mouth daily. 1200mg  once daily    . furosemide (LASIX) 40 MG tablet Take 1 tablet (40 mg total) by mouth 2 (two) times daily. NEED TO BE SEEN FOR FURTHER REFILLS. 180 tablet 0  . gabapentin (NEURONTIN) 300 MG capsule TAKE TWO CAPSULES BY MOUTH 3 TIMES DAILY 180 capsule 3  . glipiZIDE (GLUCOTROL) 10 MG tablet Take 1 tablet (10 mg total) by mouth 2 (two) times daily before a meal. 60 tablet 2  . HYDROcodone-acetaminophen  (NORCO) 10-325 MG per tablet Take 1 tablet by mouth 3 (three) times daily. As needed for back or leg pain 90 tablet 0  . ibuprofen (ADVIL,MOTRIN) 800 MG tablet TAKE 1 TABLET BY MOUTH THREE TIMES A DAY 90 tablet 6  . metFORMIN (GLUCOPHAGE) 1000 MG tablet Take 1 tablet (1,000 mg total) by mouth 2 (two) times daily. 60 tablet 2  . morphine (MS CONTIN) 30 MG 12 hr tablet Take 1 tablet (30 mg total) by mouth every 12 (twelve) hours. 60 tablet 0  . Nystatin (NYAMYC) 100000 UNIT/GM POWD APPLY TO AFFECTED AREA 2-3 TIMES DAILY AFTER APPLYING MYCOLOG OINTMENT 60 g 2  . nystatin-triamcinolone ointment (MYCOLOG) APPLY TO AFFECTED AREA TWICE A DAY 30 g 2  . potassium chloride SA (K-DUR,KLOR-CON) 20 MEQ tablet Take 1 tablet (20 mEq total) by mouth daily. Needs to be seen for further refills 90 tablet 0  . simvastatin (ZOCOR) 20 MG tablet TAKE 1 TABLET BY MOUTH DAILY 30 tablet 0  . SYNTHROID 175 MCG tablet Take 1 tablet (175 mcg total) by mouth daily. * Need to schedule appointment for thyroid labs, and a follow up with Dr Deborra Medina* 60 tablet 0  . venlafaxine (EFFEXOR) 75 MG tablet Take three tablets by mouth every night at bedtime 90 tablet 3   No current facility-administered medications on file prior to visit.  Allergies  Allergen Reactions  . Ace Inhibitors     Angioedema   . Erythromycin     REACTION: Nausea and vomiting  . Invokana [Canagliflozin] Other (See Comments)    Yeast infection    Past Medical History  Diagnosis Date  . Hyperlipidemia   . Hypertension   . Low back pain   . Morbid obesity   . Diabetes mellitus     type II  . Thyroid disease     hypothyroidism  . GERD (gastroesophageal reflux disease)   . Lymphedema   . Lumbosacral spondylosis without myelopathy   . Pain in limb   . Lumbago   . Facet syndrome, lumbar   . Lymphedema   . Primary localized osteoarthrosis, lower leg   . Hypercholesteremia     Past Surgical History  Procedure Laterality Date  . Cholecystectomy     . Tubal ligation      Family History  Problem Relation Age of Onset  . Cancer Mother     lung and colon  . Stroke Sister     History   Social History  . Marital Status: Married    Spouse Name: N/A  . Number of Children: 1  . Years of Education: N/A   Occupational History  . Diability    Social History Main Topics  . Smoking status: Never Smoker   . Smokeless tobacco: Never Used  . Alcohol Use: No  . Drug Use: No  . Sexual Activity: Not on file   Other Topics Concern  . Not on file   Social History Narrative   Lives in [place], one adult child      Caffeine use: coffee daily in am   The PMH, PSH, Social History, Family History, Medications, and allergies have been reviewed in [facility], and have been updated if relevant.    Review of Systems  Constitutional: Positive for chills.  HENT: Positive for congestion, postnasal drip and rhinorrhea.   Respiratory: Positive for cough, shortness of breath and wheezing.   Cardiovascular: Negative.   Gastrointestinal: Positive for diarrhea. Negative for nausea, vomiting, abdominal pain, constipation, blood in stool, anal bleeding and rectal pain.  Endocrine: Negative.   Genitourinary: Negative.   Musculoskeletal: Negative.   Allergic/Immunologic: Negative.   Neurological: Negative.   Hematological: Negative.   All other systems reviewed and are negative.      Objective:    BP 148/90 mmHg  Pulse 129  Temp(Src) 98.2 F (36.8 C) (Oral)  Wt 352 lb 8 oz (159.893 kg)  SpO2 87%   Physical Exam  Constitutional: She is oriented to person, place, and time. She appears well-developed and well-nourished. No distress.  Morbidly obese  HENT:  Head: Normocephalic and atraumatic.  Eyes: Conjunctivae are normal.  Neck: Normal range of motion.  Cardiovascular:  Tachycardia, resolved after she has been sitting in room for a few minutes  Pulmonary/Chest: Effort normal. No respiratory distress. She has wheezes. She has no  rales. She exhibits no tenderness.  Abdominal: She exhibits no distension.  Difficult to assess due to body habitus  Musculoskeletal: Normal range of motion.  Neurological: She is alert and oriented to person, place, and time. No cranial nerve deficit.  Skin: Skin is warm and dry.  Psychiatric: She has a normal mood and affect. Her behavior is normal. Judgment and thought content normal.  Nursing note and vitals reviewed.         Assessment & Plan:   Acute bronchitis, unspecified organism - Plan: albuterol (  PROAIR HFA) 108 (90 BASE) MCG/ACT inhaler  OBESITY, MORBID  Hypothyroidism, unspecified hypothyroidism type - Plan: TSH, T4, Free  Uncontrolled type 2 diabetes with neuropathy - Plan: Hemoglobin A1c  Diarrhea No Follow-up on file.

## 2015-03-10 NOTE — Assessment & Plan Note (Signed)
New- zpack as directed. proair inhaler as needed. Call or return to clinic prn if these symptoms worsen or fail to improve as anticipated. The patient indicates understanding of these issues and agrees with the plan.

## 2015-03-10 NOTE — Progress Notes (Signed)
Pre visit review using our clinic review tool, if applicable. No additional management support is needed unless otherwise documented below in the visit note. 

## 2015-03-10 NOTE — Assessment & Plan Note (Signed)
New- ongoing issue. ?gastroparesis. Trial of Reglan.  Refer to GI for colonoscopy if no improvement- I advised that she go have a colonoscopy for screening purposes as well.

## 2015-03-16 ENCOUNTER — Ambulatory Visit: Payer: No Typology Code available for payment source | Admitting: Physical Medicine & Rehabilitation

## 2015-03-16 ENCOUNTER — Ambulatory Visit: Payer: No Typology Code available for payment source

## 2015-03-16 ENCOUNTER — Ambulatory Visit (HOSPITAL_COMMUNITY)
Admission: RE | Admit: 2015-03-16 | Discharge: 2015-03-16 | Disposition: A | Discharge status: Home / Self Care | Payer: Medicare Other | Source: Ambulatory Visit | Attending: Physical Medicine & Rehabilitation | Admitting: Physical Medicine & Rehabilitation

## 2015-03-16 DIAGNOSIS — M25511 Pain in right shoulder: Secondary | ICD-10-CM

## 2015-03-16 DIAGNOSIS — M25562 Pain in left knee: Secondary | ICD-10-CM | POA: Insufficient documentation

## 2015-03-16 DIAGNOSIS — M25561 Pain in right knee: Secondary | ICD-10-CM | POA: Diagnosis not present

## 2015-03-16 DIAGNOSIS — M25512 Pain in left shoulder: Secondary | ICD-10-CM

## 2015-03-16 DIAGNOSIS — M19012 Primary osteoarthritis, left shoulder: Secondary | ICD-10-CM | POA: Diagnosis not present

## 2015-03-16 DIAGNOSIS — M1712 Unilateral primary osteoarthritis, left knee: Secondary | ICD-10-CM | POA: Diagnosis not present

## 2015-03-16 DIAGNOSIS — M1711 Unilateral primary osteoarthritis, right knee: Secondary | ICD-10-CM | POA: Diagnosis not present

## 2015-03-16 DIAGNOSIS — M19011 Primary osteoarthritis, right shoulder: Secondary | ICD-10-CM | POA: Diagnosis not present

## 2015-03-17 ENCOUNTER — Other Ambulatory Visit: Payer: Self-pay | Admitting: Family Medicine

## 2015-03-19 ENCOUNTER — Encounter: Payer: Self-pay | Admitting: Physical Medicine & Rehabilitation

## 2015-03-19 ENCOUNTER — Encounter: Payer: Medicare Other | Attending: Physical Medicine & Rehabilitation

## 2015-03-19 ENCOUNTER — Ambulatory Visit (HOSPITAL_BASED_OUTPATIENT_CLINIC_OR_DEPARTMENT_OTHER): Payer: Medicare Other | Admitting: Physical Medicine & Rehabilitation

## 2015-03-19 VITALS — BP 186/88 | HR 104 | Resp 14

## 2015-03-19 DIAGNOSIS — G8929 Other chronic pain: Secondary | ICD-10-CM | POA: Diagnosis not present

## 2015-03-19 DIAGNOSIS — M25562 Pain in left knee: Secondary | ICD-10-CM | POA: Insufficient documentation

## 2015-03-19 DIAGNOSIS — M7541 Impingement syndrome of right shoulder: Secondary | ICD-10-CM | POA: Diagnosis not present

## 2015-03-19 DIAGNOSIS — Z5181 Encounter for therapeutic drug level monitoring: Secondary | ICD-10-CM | POA: Diagnosis not present

## 2015-03-19 DIAGNOSIS — Z79899 Other long term (current) drug therapy: Secondary | ICD-10-CM | POA: Insufficient documentation

## 2015-03-19 DIAGNOSIS — M25561 Pain in right knee: Secondary | ICD-10-CM | POA: Insufficient documentation

## 2015-03-19 DIAGNOSIS — M75102 Unspecified rotator cuff tear or rupture of left shoulder, not specified as traumatic: Secondary | ICD-10-CM | POA: Insufficient documentation

## 2015-03-19 MED ORDER — HYDROCODONE-ACETAMINOPHEN 10-325 MG PO TABS: 1.0000 | ORAL_TABLET | Freq: Three times a day (TID) | ORAL | Status: DC

## 2015-03-19 MED ORDER — MORPHINE SULFATE ER 30 MG PO TBCR: 30.0000 mg | EXTENDED_RELEASE_TABLET | Freq: Two times a day (BID) | ORAL | Status: DC

## 2015-03-19 NOTE — Progress Notes (Signed)
Subjective:    Patient ID: Theresa Huffman, female    DOB: 1946-07-17, 69 y.o.   MRN: [id number]  HPI 69 year old female with chronic knee greater than shoulder pain She has a history of morbid obesity, has been on chronic narcotic analgesics. She had not had imaging studies for quite some time and these were ordered at last visit approximately one month ago. These x-rays were reviewed with the patient. Patient has primary complaint of right lateral knee pain as well as right shoulder pain No recent trauma No history of shoulder or knee surgery  Patient has considered bariatric surgery on 2 occasions but never went through with it. Pain Inventory Average Pain 8 Pain Right Now 8 My pain is constant, burning and aching Worst in right knee.  In the last 24 hours, has pain interfered with the following? General activity 9 Relation with others 7 Enjoyment of life 9 What TIME of day is your pain at its worst? morning and evening Sleep (in general) Fair 4 hours, then up for a while, due in part to pain  Pain is worse with: walking, bending and standing Pain improves with: rest and medication Relief from Meds: 7   Mobility walk with assistance how many minutes can you walk? 10 - uses crutches ability to climb steps?  no do you drive?  yes  Function disabled: date disabled .  Neuro/Psych weakness trouble walking depression In legs and low back, mostly legs  Prior Studies Any changes since last visit?  no  Xrays: Bilateral shoulder as well as bilateral knee x-rays from April 2016 were reviewed, see results review section  Physicians involved in your care Any changes since last visit?  no   Family History  Problem Relation Age of Onset  . Cancer Mother     lung and colon  . Stroke Sister    History   Social History  . Marital Status: Married    Spouse Name: N/A  . Number of Children: 1  . Years of Education: N/A   Occupational History  . Diability     Social History Main Topics  . Smoking status: Never Smoker   . Smokeless tobacco: Never Used  . Alcohol Use: No  . Drug Use: No  . Sexual Activity: Not on file   Other Topics Concern  . None   Social History Narrative   Lives in [place], one adult child      Caffeine use: coffee daily in am   Past Surgical History  Procedure Laterality Date  . Cholecystectomy    . Tubal ligation     Past Medical History  Diagnosis Date  . Hyperlipidemia   . Hypertension   . Low back pain   . Morbid obesity   . Diabetes mellitus     type II  . Thyroid disease     hypothyroidism  . GERD (gastroesophageal reflux disease)   . Lymphedema   . Lumbosacral spondylosis without myelopathy   . Pain in limb   . Lumbago   . Facet syndrome, lumbar   . Lymphedema   . Primary localized osteoarthrosis, lower leg   . Hypercholesteremia    BP 186/88 mmHg  Pulse 104  Resp 14  SpO2 88%  Opioid Risk Score:   Fall Risk Score: Moderate Fall Risk (6-13 points)`1  Depression screen PHQ 2/9  No flowsheet data found.   Review of Systems  HENT: Negative.   Eyes: Negative.   Respiratory:  Respiratory infection  Cardiovascular: Negative.   Endocrine: Negative.   Genitourinary: Negative.   Musculoskeletal: Positive for myalgias, back pain and arthralgias.       Shoulder pain, knee pain radiating into legs  Skin: Negative.   Allergic/Immunologic: Negative.   Neurological: Positive for weakness.       Trouble walking  Hematological: Negative.   Psychiatric/Behavioral: Positive for dysphoric mood.  R knee locks up Morphine BID, hydrocodone TID    Objective:   Physical Exam  Constitutional: She is oriented to person, place, and time. She appears well-developed.  Morbid obesity  HENT:  Head: Normocephalic and atraumatic.  Eyes: Conjunctivae and EOM are normal. Pupils are equal, round, and reactive to light.  Neck: Normal range of motion.  Musculoskeletal:       Right  shoulder: She exhibits pain. She exhibits normal range of motion and no deformity.       Left shoulder: Normal.       Right knee: She exhibits decreased range of motion and deformity. Tenderness found. Lateral joint line tenderness noted.       Left knee: She exhibits decreased range of motion. No tenderness found.  Positive impingement sign right shoulder  Right knee lateral joint line tenderness limited range of motion no evidence of effusion although with body habitus difficult to assess  Panniculus at posterior thigh medial aspect as well right lower extremity  Neurological: She is alert and oriented to person, place, and time. No sensory deficit.  Motor strength is 4/5 bilateral deltoid, biceps, triceps and grip  Extension is 5 hip flexion is 3 minus limited by habitus knee flexion is limited by panniculus and the postterior thigh area right greater than left  Psychiatric: She has a normal mood and affect.  Nursing note and vitals reviewed.         Assessment & Plan:   1. Chronic pain syndrome multifactorial part polyarthralgia, she has severe osteoarthritis right lateral knee compartment and left medial knee compartment Moderate osteoarthritis in the right shoulder and mild osteoarthritis in the left shoulder  We discussed her treatment options in regards to her knees, she gets only a couple weeks relief with corticosteroids, do not think she is a good Synvisc candidate secondary to severity of arthritis in the right lateral compartment. Geniculate nerve blocks are possibility although once again given severity of arthritis not sure whether these will be that helpful. Patient ultimately will need total knee replacement however she needs weight loss first Patient will consult again with bariatric surgery and have this as a first step  Continue current pain medications morphine extended release 30 mg twice a day Hydrocodone 10 mg3 times a day  Return to clinic one month  2.  Right shoulder pain exam is more consistent with impingement rather than osteoarthritis however she would benefit from injection in either case  Shoulder injection Right   Indication:Right Shoulder pain not relieved by medication management and other conservative care.  Informed consent was obtained after describing risks and benefits of the procedure with the patient, this includes bleeding, bruising, infection and medication side effects. The patient wishes to proceed and has given written consent. Patient was placed in a seated position. The Right shoulder was marked and prepped with betadine in the subacromial area. A 25-gauge 1-1/2 inch needle was inserted into the subacromial area. After negative draw back for blood, a solution containing 1 mL of 6 mg per ML betamethasone and 4 mL of 1% lidocaine was injected. A band  aid was applied. The patient tolerated the procedure well. Post procedure instructions were given.

## 2015-03-19 NOTE — Patient Instructions (Signed)
Joint Injection  Care After  Refer to this sheet in the next few days. These instructions provide you with information on caring for yourself after you have had a joint injection. Your caregiver also may give you more specific instructions. Your treatment has been planned according to current medical practices, but problems sometimes occur. Call your caregiver if you have any problems or questions after your procedure.  After any type of joint injection, it is not uncommon to experience:  · Soreness, swelling, or bruising around the injection site.  · Mild numbness, tingling, or weakness around the injection site caused by the numbing medicine used before or with the injection.  It also is possible to experience the following effects associated with the specific agent after injection:  · Iodine-based contrast agents:  ¨ Allergic reaction (itching, hives, widespread redness, and swelling beyond the injection site).  · Corticosteroids (These effects are rare.):  ¨ Allergic reaction.  ¨ Increased blood sugar levels (If you have diabetes and you notice that your blood sugar levels have increased, notify your caregiver).  ¨ Increased blood pressure levels.  ¨ Mood swings.  · Hyaluronic acid in the use of viscosupplementation.  ¨ Temporary heat or redness.  ¨ Temporary rash and itching.  ¨ Increased fluid accumulation in the injected joint.  These effects all should resolve within a day after your procedure.   HOME CARE INSTRUCTIONS  · Limit yourself to light activity the day of your procedure. Avoid lifting heavy objects, bending, stooping, or twisting.  · Take prescription or over-the-counter pain medication as directed by your caregiver.  · You may apply ice to your injection site to reduce pain and swelling the day of your procedure. Ice may be applied 03-04 times:  ¨ Put ice in a plastic bag.  ¨ Place a towel between your skin and the bag.  ¨ Leave the ice on for no longer than 15-20 minutes each time.  SEEK  IMMEDIATE MEDICAL CARE IF:   · Pain and swelling get worse rather than better or extend beyond the injection site.  · Numbness does not go away.  · Blood or fluid continues to leak from the injection site.  · You have chest pain.  · You have swelling of your face or tongue.  · You have trouble breathing or you become dizzy.  · You develop a fever, chills, or severe tenderness at the injection site that last longer than 1 day.  MAKE SURE YOU:  · Understand these instructions.  · Watch your condition.  · Get help right away if you are not doing well or if you get worse.  Document Released: 06/30/2011 Document Revised: 01/09/2012 Document Reviewed: 06/30/2011  [employer]® Patient Information ©2015 [employer]. This information is not intended to replace advice given to you by your health care provider. Make sure you discuss any questions you have with your health care provider.

## 2015-03-24 ENCOUNTER — Other Ambulatory Visit: Payer: Self-pay | Admitting: Family Medicine

## 2015-03-25 ENCOUNTER — Encounter: Payer: Self-pay | Admitting: Internal Medicine

## 2015-03-25 ENCOUNTER — Ambulatory Visit (INDEPENDENT_AMBULATORY_CARE_PROVIDER_SITE_OTHER): Payer: Medicare Other | Admitting: Internal Medicine

## 2015-03-25 VITALS — BP 114/72 | HR 77 | Temp 98.0°F | Resp 14

## 2015-03-25 DIAGNOSIS — E1141 Type 2 diabetes mellitus with diabetic mononeuropathy: Secondary | ICD-10-CM | POA: Diagnosis not present

## 2015-03-25 DIAGNOSIS — E1165 Type 2 diabetes mellitus with hyperglycemia: Principal | ICD-10-CM

## 2015-03-25 DIAGNOSIS — E1149 Type 2 diabetes mellitus with other diabetic neurological complication: Secondary | ICD-10-CM

## 2015-03-25 DIAGNOSIS — IMO0002 Reserved for concepts with insufficient information to code with codable children: Secondary | ICD-10-CM

## 2015-03-25 MED ORDER — INSULIN ISOPHANE HUMAN 100 UNIT/ML KWIKPEN: PEN_INJECTOR | SUBCUTANEOUS | Status: AC

## 2015-03-25 MED ORDER — INSULIN PEN NEEDLE 32G X 4 MM MISC: Status: DC

## 2015-03-25 NOTE — Patient Instructions (Addendum)
Please continue: - Metformin 1000 mg 2x a day - Glipizide to 10 mg 2x a day  Please start Insulin NPH (Insulin N); - 20 units in am - 15 units at bedtime Please increase the doses if sugars not decreasing in 4-5 days: - 25 units in am  - 20 units at bedtime  Take the thyroid hormone every day, with water, >30 minutes before breakfast, separated by >4 hours from acid reflux medications, calcium, iron, multivitamins. Please move calcium at night.  Please return in 2 months with your sugar log.

## 2015-03-25 NOTE — Progress Notes (Signed)
Patient ID: Theresa Huffman, female   DOB: [birth year], 69 y.o.   MRN: [id number]  HPI: Theresa Huffman is a 69 y.o.-year-old female, returning for f/u for DM2, dx 2010, non-insulin-dependent, uncontrolled, with complications (Peripheral neuropathy, mild CKD). She would also want me to address her hypothyroidism. Last visit 6 mo ago.   DM2: Last hemoglobin A1c was: Lab Results  Component Value Date   HGBA1C 9.2* 03/10/2015   HGBA1C 8.8* 10/02/2014   HGBA1C 8.6* 06/12/2014  Prev. HbA1C was 8.5% in 04/2011. She believes her HbA1c increased due to getting back to eating sweats.   Pt is on a regimen of: - Metformin 1000 bid - Glipizide 5 bid She was on Invokana 300 mg - restarted at last visit as she had run out of it, but she developed a yeast inf >> stopped it 08/26/2014 We started Cycloset - 1.6 mg >> ran out of it and did not refill We had to stop Januvia 100 - expensive. She is in the doughnut hole by the end of the year.   Pt checks her sugars once a day and they are same: - am: 130-235, lately 127-164 >> 91-155 (195) >> 118, 144-181, 204 >> 140-187 >> 176, 194-352 - before lunch: 208 >> 186 >> n/c - before dinner: 125-159 >> 114-163 >> n/c  - 2h after dinner: 225 >> 139-189 >> n/c - bedtime: 175-205 >> 142-189 >> 115, 138, 141 >> n/c >> 142-187 >> n/c - nighttime: 153 No lows. Lowest sugar was 118; she has hypoglycemia awareness at 80. Highest sugar was low 200s.  She was considering GBP, but is afraid of the sx.   - has mild CKD, last BUN/creatinine:  Lab Results  Component Value Date   BUN 14 02/13/2014   CREATININE 0.9 02/13/2014  Has h/o angioedema - ACEI stopped - last set of lipids: Lab Results  Component Value Date   CHOL 183 08/25/2014   HDL 47.50 08/25/2014   LDLDIRECT 92.5 08/25/2014   TRIG 292.0* 08/25/2014   CHOLHDL 4 08/25/2014  She is on Zocor. She takes ASA 81. - last eye exam was in 2013 >> missed the appt for eye exam (04/2014). No DR.  - +  numbness and tingling in her feet - improved   Pt also has a history of HTN, Hypothyroidism, vit D def., HL, morbid obesity, lymphedema, vv insuff., GERD, lumbago, chronic bilateral knee pain. No h/o UTIs.  Hypothyroidism: - dx 2001 - on Synthroid DAW 175 mcg daily  In am At 10 am Eats 1 h later With water On calcium + D at the same!  - reviewed TFTs: Lab Results  Component Value Date   TSH 5.83* 03/10/2015   TSH 2.08 04/18/2013   TSH 2.12 08/14/2012   TSH 0.67 02/13/2012   TSH 4.66 08/30/2010   FREET4 1.09 03/10/2015   FREET4 1.37 02/13/2012   I reviewed pt's medications, allergies, PMH, social hx, family hx, and changes were documented in the history of present illness. Otherwise, unchanged from my initial visit note.  ROS: Constitutional: no weight gain and loss, no fatigue, no subjective hyperthermia/hypothermia; + poor sleep Eyes: no blurry vision, no xerophthalmia ENT: no sore throat, no nodules palpated in throat, no dysphagia/odynophagia, no hoarseness Cardiovascular: no CP/+SOB /palpitations/+ leg swelling - improved Respiratory: no cough/+SOB Gastrointestinal: no N/V/D/C, hair loss Musculoskeletal: no muscle/ joint aches Skin: no rashes  PE: BP 114/72 mmHg  Pulse 77  Temp(Src) 98 F (36.7 C) (Oral)  Resp 14  SpO2 97% There is no weight on file to calculate BMI.  Wt Readings from Last 3 Encounters:  03/10/15 352 lb 8 oz (159.893 kg)  12/02/14 375 lb 3.2 oz (170.19 kg)  10/02/14 368 lb (166.924 kg)   Constitutional: obesity grade 3, in NAD Eyes: PERRLA, EOMI, no exophthalmos ENT: moist mucous membranes, no thyromegaly, no cervical lymphadenopathy Cardiovascular: tachycardia, RR, No MRG Respiratory: CTA B Gastrointestinal: abdomen soft, NT, ND, BS+ Musculoskeletal: strength intact in all 4 Skin: moist, warm, no rashes Severe lymphedema bilateral legs, but improved  ASSESSMENT: 1. DM2, insulin-dependent, uncontrolled, with complications - PN, on  Neurontin  2. Hypothyroidism - On d.a.w. Synthroid    PLAN:  1. Patient with long-standing,with worse controlled diabetes after stopping Invokana. Her sugars are now even worse in the last 2-3 months and she attributes this to dietary indiscretions. Since her sugars are in the 200s to 300s in a.m. (no sugar checks later in the day), I believe that she needs long acting insulin at this point. We discussed about different types of regimens, and she opted for insulin and, which her husband takes, and which would be cheaper for her. - I suggested to:  Patient Instructions  Please continue: - Metformin 1000 mg 2x a day - Glipizide to 10 mg 2x a day  Please start Insulin NPH (Insulin N); - 20 units in am - 15 units at bedtime Please increase the doses if sugars not decreasing in 4-5 days: - 25 units in am  - 20 units at bedtime Please return in 2 months with your sugar log.   - advised her to continue checking her sugars at different times of the day - check 2 times a day, rotating checkso - Needs a new eye exam - We reviewed her latest HbA1c which has increased - Return to clinic in 1.5 months with sugar log    2. Hypothyroidism - She would also want me to address this problem - Reviewed the latest thyroid labs: TSH was increased - Take the thyroid hormone every day, with water, >30 minutes before breakfast, separated by >4 hours from acid reflux medications, calcium, iron, multivitamins. As of now, she is taking calcium along with the levothyroxine. I advised her to separate the 2 tablets and will recheck her labs in a month and a half. - For now, continue 175 mcg of Synthroid daily  3. Obesity grade 3 - She appears to have lost approximately 20 pounds in the last 3 months

## 2015-03-26 MED ORDER — GLIPIZIDE 10 MG PO TABS: 10.0000 mg | ORAL_TABLET | Freq: Two times a day (BID) | ORAL | Status: DC

## 2015-04-08 ENCOUNTER — Encounter: Payer: Self-pay | Admitting: Internal Medicine

## 2015-04-08 ENCOUNTER — Encounter: Payer: Medicare Other | Admitting: Registered Nurse

## 2015-04-14 DIAGNOSIS — D2262 Melanocytic nevi of left upper limb, including shoulder: Secondary | ICD-10-CM | POA: Diagnosis not present

## 2015-04-14 DIAGNOSIS — L820 Inflamed seborrheic keratosis: Secondary | ICD-10-CM | POA: Diagnosis not present

## 2015-04-16 ENCOUNTER — Other Ambulatory Visit: Payer: Self-pay | Admitting: Family Medicine

## 2015-04-20 LAB — HM DIABETES EYE EXAM

## 2015-04-21 DIAGNOSIS — H538 Other visual disturbances: Secondary | ICD-10-CM | POA: Diagnosis not present

## 2015-04-21 DIAGNOSIS — E119 Type 2 diabetes mellitus without complications: Secondary | ICD-10-CM | POA: Diagnosis not present

## 2015-04-23 ENCOUNTER — Other Ambulatory Visit (INDEPENDENT_AMBULATORY_CARE_PROVIDER_SITE_OTHER): Payer: Medicare Other

## 2015-04-23 ENCOUNTER — Ambulatory Visit: Payer: Medicare Other | Admitting: Family Medicine

## 2015-04-23 ENCOUNTER — Other Ambulatory Visit: Payer: Self-pay | Admitting: Family Medicine

## 2015-04-23 DIAGNOSIS — E1140 Type 2 diabetes mellitus with diabetic neuropathy, unspecified: Secondary | ICD-10-CM

## 2015-04-23 DIAGNOSIS — E1165 Type 2 diabetes mellitus with hyperglycemia: Secondary | ICD-10-CM

## 2015-04-23 DIAGNOSIS — R197 Diarrhea, unspecified: Secondary | ICD-10-CM

## 2015-04-23 DIAGNOSIS — E785 Hyperlipidemia, unspecified: Secondary | ICD-10-CM

## 2015-04-23 DIAGNOSIS — I10 Essential (primary) hypertension: Secondary | ICD-10-CM

## 2015-04-23 DIAGNOSIS — E039 Hypothyroidism, unspecified: Secondary | ICD-10-CM | POA: Diagnosis not present

## 2015-04-23 DIAGNOSIS — IMO0002 Reserved for concepts with insufficient information to code with codable children: Secondary | ICD-10-CM

## 2015-04-23 LAB — COMPREHENSIVE METABOLIC PANEL
ALK PHOS: 91 U/L (ref 39–117)
ALT: 18 U/L (ref 0–35)
AST: 31 U/L (ref 0–37)
Albumin: 3.9 g/dL (ref 3.5–5.2)
BUN: 14 mg/dL (ref 6–23)
CO2: 30 mEq/L (ref 19–32)
CREATININE: 0.9 mg/dL (ref 0.40–1.20)
Calcium: 9.5 mg/dL (ref 8.4–10.5)
Chloride: 99 mEq/L (ref 96–112)
GFR: 66.47 mL/min (ref >60.00)
GLUCOSE: 172 mg/dL — AB (ref 70–99)
Potassium: 4.4 mEq/L (ref 3.5–5.1)
Sodium: 138 mEq/L (ref 135–145)
Total Bilirubin: 0.4 mg/dL (ref 0.2–1.2)
Total Protein: 7.6 g/dL (ref 6.0–8.3)

## 2015-04-23 LAB — LIPID PANEL
Cholesterol: 150 mg/dL (ref 0–200)
HDL: 47.1 mg/dL (ref >39.00)
LDL Cholesterol: 68 mg/dL (ref 0–99)
NonHDL: 102.9
Total CHOL/HDL Ratio: 3
Triglycerides: 177 mg/dL — ABNORMAL HIGH (ref 0.0–149.0)
VLDL: 35.4 mg/dL (ref 0.0–40.0)

## 2015-04-23 LAB — T4, FREE: FREE T4: 1.19 ng/dL (ref 0.60–1.60)

## 2015-04-23 LAB — TSH: TSH: 3.57 u[IU]/mL (ref 0.35–4.50)

## 2015-04-24 ENCOUNTER — Encounter: Payer: Self-pay | Admitting: Family Medicine

## 2015-04-27 ENCOUNTER — Other Ambulatory Visit: Payer: Self-pay | Admitting: Family Medicine

## 2015-04-27 ENCOUNTER — Other Ambulatory Visit: Payer: Self-pay

## 2015-04-27 MED ORDER — FUROSEMIDE 40 MG PO TABS: 40.0000 mg | ORAL_TABLET | Freq: Two times a day (BID) | ORAL | Status: DC

## 2015-04-27 MED ORDER — AMLODIPINE BESYLATE 5 MG PO TABS: ORAL_TABLET | ORAL | Status: DC

## 2015-04-27 NOTE — Addendum Note (Signed)
Addended by: Modena Nunnery on: 04/27/2015 02:56 PM   Modules accepted: Orders

## 2015-04-28 ENCOUNTER — Encounter: Payer: Self-pay | Admitting: Registered Nurse

## 2015-04-28 ENCOUNTER — Encounter: Payer: Medicare Other | Attending: Physical Medicine & Rehabilitation | Admitting: Registered Nurse

## 2015-04-28 ENCOUNTER — Other Ambulatory Visit: Payer: Self-pay | Admitting: Physical Medicine & Rehabilitation

## 2015-04-28 VITALS — BP 140/75 | HR 96 | Resp 16

## 2015-04-28 DIAGNOSIS — M75102 Unspecified rotator cuff tear or rupture of left shoulder, not specified as traumatic: Secondary | ICD-10-CM | POA: Diagnosis not present

## 2015-04-28 DIAGNOSIS — Z79899 Other long term (current) drug therapy: Secondary | ICD-10-CM | POA: Diagnosis not present

## 2015-04-28 DIAGNOSIS — M25512 Pain in left shoulder: Secondary | ICD-10-CM

## 2015-04-28 DIAGNOSIS — Z5181 Encounter for therapeutic drug level monitoring: Secondary | ICD-10-CM | POA: Diagnosis not present

## 2015-04-28 DIAGNOSIS — M25511 Pain in right shoulder: Secondary | ICD-10-CM

## 2015-04-28 DIAGNOSIS — M47817 Spondylosis without myelopathy or radiculopathy, lumbosacral region: Secondary | ICD-10-CM

## 2015-04-28 DIAGNOSIS — M7541 Impingement syndrome of right shoulder: Secondary | ICD-10-CM | POA: Diagnosis not present

## 2015-04-28 DIAGNOSIS — M25561 Pain in right knee: Secondary | ICD-10-CM | POA: Diagnosis not present

## 2015-04-28 DIAGNOSIS — G894 Chronic pain syndrome: Secondary | ICD-10-CM

## 2015-04-28 DIAGNOSIS — G8929 Other chronic pain: Secondary | ICD-10-CM | POA: Diagnosis not present

## 2015-04-28 DIAGNOSIS — M25562 Pain in left knee: Secondary | ICD-10-CM | POA: Diagnosis not present

## 2015-04-28 MED ORDER — MORPHINE SULFATE ER 30 MG PO TBCR: 30.0000 mg | EXTENDED_RELEASE_TABLET | Freq: Two times a day (BID) | ORAL | Status: DC

## 2015-04-28 MED ORDER — HYDROCODONE-ACETAMINOPHEN 10-325 MG PO TABS: 1.0000 | ORAL_TABLET | Freq: Three times a day (TID) | ORAL | Status: DC

## 2015-04-28 NOTE — Progress Notes (Signed)
Subjective:    Patient ID: Theresa Huffman, female    DOB: 08-09-1946, 69 y.o.   MRN: [id number]  HPI: Theresa Huffman is a 69 year old female who returns for follow up for chronic pain and medication refill. She says her pain is in herbilateral shoulders, lower back and bilateral knees. She rates her pain 8. Her current exercise regime is walking short distances and performing chair exercises daily. Encourage to increase activity twice a week.  Pain Inventory Average Pain 8 Pain Right Now 8 My pain is constant, burning, dull, tingling and aching  In the last 24 hours, has pain interfered with the following? General activity 9 Relation with others 7 Enjoyment of life 7 What TIME of day is your pain at its worst? morning and daytime Sleep (in general) Poor  Pain is worse with: walking, bending, standing and some activites Pain improves with: rest and medication Relief from Meds: 3  Mobility walk with assistance how many minutes can you walk? 10 ability to climb steps?  no do you drive?  yes  Function disabled: date disabled .  Neuro/Psych weakness tingling trouble walking depression  Prior Studies Any changes since last visit?  no  Physicians involved in your care Any changes since last visit?  no   Family History  Problem Relation Age of Onset  . Cancer Mother     lung and colon  . Stroke Sister    History   Social History  . Marital Status: Married    Spouse Name: N/A  . Number of Children: 1  . Years of Education: N/A   Occupational History  . Diability    Social History Main Topics  . Smoking status: Never Smoker   . Smokeless tobacco: Never Used  . Alcohol Use: No  . Drug Use: No  . Sexual Activity: Not on file   Other Topics Concern  . None   Social History Narrative   Lives in [place], one adult child      Caffeine use: coffee daily in am   Past Surgical History  Procedure Laterality Date  . Cholecystectomy    .  Tubal ligation     Past Medical History  Diagnosis Date  . Hyperlipidemia   . Hypertension   . Low back pain   . Morbid obesity   . Diabetes mellitus     type II  . Thyroid disease     hypothyroidism  . GERD (gastroesophageal reflux disease)   . Lymphedema   . Lumbosacral spondylosis without myelopathy   . Pain in limb   . Lumbago   . Facet syndrome, lumbar   . Lymphedema   . Primary localized osteoarthrosis, lower leg   . Hypercholesteremia    BP 166/88 mmHg  Pulse 96  Resp 16  SpO2 94%  Opioid Risk Score:   Fall Risk Score: Moderate Fall Risk (6-13 points)`1  Depression screen PHQ 2/9  No flowsheet data found.   Review of Systems  HENT: Negative.   Eyes: Negative.   Respiratory: Positive for shortness of breath.   Cardiovascular: Negative.   Gastrointestinal: Negative.   Endocrine: Negative.   Genitourinary: Negative.   Musculoskeletal: Positive for myalgias and arthralgias.       Bilateral burning in legs, neuropathy  Skin: Negative.   Allergic/Immunologic: Negative.   Neurological: Positive for weakness.       Trouble walking, tingling, should pain bilaterally  Hematological: Negative.   Psychiatric/Behavioral: Positive for dysphoric mood.  Objective:   Physical Exam  Constitutional: She is oriented to person, place, and time. She appears well-developed and well-nourished.  HENT:  Head: Normocephalic and atraumatic.  Neck: Normal range of motion. Neck supple.  Cardiovascular: Normal rate and regular rhythm.   Pulmonary/Chest: Effort normal and breath sounds normal.  Musculoskeletal:  Normal Muscle Bulk and Muscle Testing Reveals: Upper Extremities: Full ROM and Muscle Strength 5/5 Thoracic Paraspinal Tenderness: T-1- T-3  Lumbar Paraspinal Tenderness: L-1- L-3 Lower Extremities: Full ROM and Muscle Strength 5/5 Arises from chair with Ease using [place] Crutches Narrow Based Gait  Neurological: She is alert and oriented to person,  place, and time.  Skin: Skin is warm and dry.  Psychiatric: She has a normal mood and affect.  Nursing note and vitals reviewed.         Assessment & Plan:  1. Lumbar pain chronic, no radiculopathy: Lumbar degenerative disc L4-5 and L5-S1.  Refilled: HYDRcodone 10/325mg  one tablet TID #90.  MS CONTIN 30 mg one every 12 hours #60. Second script given to accommodate scheduled appointment. 2. Bilateral knee osteoarthritis: Continue with Exercise and Heat therapy. Continue current medication regime.  3. Morbid Obesity: Continue with Diabetic Diet Regime and Exercise Regimen.  20 minutes of face to face patient care time was spent during this visit. All questions were encouraged and answered.   F/U in 1 month

## 2015-04-29 ENCOUNTER — Encounter: Payer: Self-pay | Admitting: Family Medicine

## 2015-04-29 LAB — PMP ALCOHOL METABOLITE (ETG): Ethyl Glucuronide (EtG): NEGATIVE ng/mL

## 2015-05-02 LAB — OPIATES/OPIOIDS (LC/MS-MS)
Codeine Urine: NEGATIVE ng/mL (ref <50)
HYDROMORPHONE: 177 ng/mL (ref <50)
Hydrocodone: 1485 ng/mL (ref <50)
Morphine Urine: 16795 ng/mL (ref <50)
Norhydrocodone, Ur: 478 ng/mL (ref <50)
Noroxycodone, Ur: NEGATIVE ng/mL (ref <50)
Oxycodone, ur: NEGATIVE ng/mL (ref <50)
Oxymorphone: NEGATIVE ng/mL (ref <50)

## 2015-05-05 LAB — PRESCRIPTION MONITORING PROFILE (SOLSTAS)
Amphetamine/Meth: NEGATIVE ng/mL
BENZODIAZEPINE SCREEN, URINE: NEGATIVE ng/mL
BUPRENORPHINE, URINE: NEGATIVE ng/mL
Barbiturate Screen, Urine: NEGATIVE ng/mL
Cannabinoid Scrn, Ur: NEGATIVE ng/mL
Carisoprodol, Urine: NEGATIVE ng/mL
Cocaine Metabolites: NEGATIVE ng/mL
Creatinine, Urine: 41.12 mg/dL (ref >20.0)
ECSTASY: NEGATIVE ng/mL
FENTANYL URINE: NEGATIVE ng/mL
Meperidine, Ur: NEGATIVE ng/mL
Methadone Screen, Urine: NEGATIVE ng/mL
Nitrites, Initial: NEGATIVE ug/mL
Oxycodone Screen, Ur: NEGATIVE ng/mL
Propoxyphene: NEGATIVE ng/mL
TAPENTADOLUR: NEGATIVE ng/mL
Tramadol Scrn, Ur: NEGATIVE ng/mL
ZOLPIDEM, URINE: NEGATIVE ng/mL
pH, Initial: 4.9 pH (ref 4.5–8.9)

## 2015-05-06 ENCOUNTER — Other Ambulatory Visit: Payer: Self-pay | Admitting: Family Medicine

## 2015-05-19 NOTE — Progress Notes (Signed)
Urine drug screen for this encounter is consistent for prescribed medication 

## 2015-05-21 ENCOUNTER — Telehealth: Payer: Self-pay

## 2015-05-21 NOTE — Telephone Encounter (Signed)
Left a message for patient to return my call about having a Mammogram set up. Will await call back.  

## 2015-05-26 ENCOUNTER — Encounter: Payer: Self-pay | Admitting: Internal Medicine

## 2015-05-26 ENCOUNTER — Ambulatory Visit (INDEPENDENT_AMBULATORY_CARE_PROVIDER_SITE_OTHER): Payer: Medicare Other | Admitting: Internal Medicine

## 2015-05-26 VITALS — BP 130/82 | HR 133 | Temp 98.1°F | Resp 16 | Ht 63.5 in | Wt 382.2 lb

## 2015-05-26 DIAGNOSIS — E039 Hypothyroidism, unspecified: Secondary | ICD-10-CM | POA: Diagnosis not present

## 2015-05-26 DIAGNOSIS — E1165 Type 2 diabetes mellitus with hyperglycemia: Principal | ICD-10-CM

## 2015-05-26 DIAGNOSIS — E1141 Type 2 diabetes mellitus with diabetic mononeuropathy: Secondary | ICD-10-CM | POA: Diagnosis not present

## 2015-05-26 DIAGNOSIS — IMO0002 Reserved for concepts with insufficient information to code with codable children: Secondary | ICD-10-CM

## 2015-05-26 DIAGNOSIS — E1149 Type 2 diabetes mellitus with other diabetic neurological complication: Secondary | ICD-10-CM

## 2015-05-26 NOTE — Patient Instructions (Signed)
Please continue: - Metformin 1000 mg 2x a day - Glipizide to 10 mg 2x a day - NPH (Insulin N): - 25 units in am  - 20 units at bedtime  Take the thyroid hormone every day, with water, >30 minutes before breakfast, separated by >4 hours from acid reflux medications, calcium, iron, multivitamins.  Please return in 3 months with your sugar log.

## 2015-05-26 NOTE — Progress Notes (Signed)
Patient ID: Theresa Huffman, female   DOB: 11/16/1945, 69 y.o.   MRN: [id number]  HPI: Theresa Huffman is a 69 y.o.-year-old female, returning for f/u for DM2, dx 2010, insulin-dependent, uncontrolled, with complications (Peripheral neuropathy, mild CKD) and hypothyroidism. Last visit 2 mo ago.   DM2: Last hemoglobin A1c was: Lab Results  Component Value Date   HGBA1C 9.2* 03/10/2015   HGBA1C 8.8* 10/02/2014   HGBA1C 8.6* 06/12/2014  Prev. HbA1C was 8.5% in 04/2011. She believes her HbA1c increased due to getting back to eating sweats.   Pt is on a regimen of: - Metformin 1000 bid - Glipizide 5 bid - NPH insulin - 25 units in am  - 20 units at bedtime She was on Invokana 300 mg - restarted at last visit as she had run out of it, but she developed a yeast inf >> stopped it 08/26/2014 We started Cycloset - 1.6 mg >> ran out of it and did not refill We had to stop Januvia 100 - expensive. She is in the doughnut hole by the end of the year.   Pt checks her sugars once a day and they are same: - am: 91-155 (195) >> 118, 144-181, 204 >> 140-187 >> 176, 194-352 >> 87, 100--146 - before lunch: 208 >> 186 >> n/c - before dinner: 125-159 >> 114-163 >> n/c >> 97-150 - 2h after dinner: 225 >> 139-189 >> n/c - bedtime: 175-205 >> 142-189 >> 115, 138, 141 >> n/c >> 142-187 >> n/c >> 93-182, 206 if eats out or chinese - nighttime: 153 >> n/c No lows. Lowest sugar was 87; she has hypoglycemia awareness at 80. Highest sugar was low 200s.  She was considering GBP, but is afraid of the sx.   - has mild CKD, last BUN/creatinine:  Lab Results  Component Value Date   BUN 14 04/23/2015   CREATININE 0.90 04/23/2015  Has h/o angioedema - ACEI stopped - last set of lipids: Lab Results  Component Value Date   CHOL 150 04/23/2015   HDL 47.10 04/23/2015   LDLCALC 68 04/23/2015   LDLDIRECT 92.5 08/25/2014   TRIG 177.0* 04/23/2015   CHOLHDL 3 04/23/2015  She is on Zocor. She takes ASA  81. - last eye exam was in 04/20/2015. No DR.  - + numbness and tingling in her feet - improved   Pt also has a history of HTN, Hypothyroidism, vit D def., HL, morbid obesity, lymphedema, vv insuff., GERD, lumbago, chronic bilateral knee pain. No h/o UTIs.  Hypothyroidism: - dx 2001 - on Synthroid DAW 175 mcg daily  In am At 10 am Eats 1 h later With water + calcium + D later in the day  - reviewed TFTs: Lab Results  Component Value Date   TSH 3.57 04/23/2015   TSH 5.83* 03/10/2015   TSH 2.08 04/18/2013   TSH 2.12 08/14/2012   TSH 0.67 02/13/2012   FREET4 1.19 04/23/2015   FREET4 1.09 03/10/2015   FREET4 1.37 02/13/2012   I reviewed pt's medications, allergies, PMH, social hx, family hx, and changes were documented in the history of present illness. Otherwise, unchanged from my initial visit note.  ROS: Constitutional: + weight gain, no fatigue, no subjective hyperthermia/hypothermia; + poor sleep Eyes: no blurry vision, no xerophthalmia ENT: no sore throat, no nodules palpated in throat, no dysphagia/odynophagia, no hoarseness Cardiovascular: no CP/+SOB /palpitations/+ leg swelling Respiratory: no cough/+SOB Gastrointestinal: no N/V/D/C, hair loss Musculoskeletal: no muscle/ joint aches Skin: no rashes  PE: BP  130/82 mmHg  Pulse 133  Temp(Src) 98.1 F (36.7 C) (Oral)  Resp 16  Ht 5' 3.5" (1.613 m)  Wt 382 lb 3.2 oz (173.365 kg)  BMI 66.63 kg/m2  SpO2 79% Body mass index is 66.63 kg/(m^2).  Wt Readings from Last 3 Encounters:  05/26/15 382 lb 3.2 oz (173.365 kg)  03/10/15 352 lb 8 oz (159.893 kg)  12/02/14 375 lb 3.2 oz (170.19 kg)   Constitutional: obesity grade 3, in NAD Eyes: PERRLA, EOMI, no exophthalmos ENT: moist mucous membranes, no thyromegaly, no cervical lymphadenopathy Cardiovascular: tachycardia, RR, No MRG Respiratory: CTA B Gastrointestinal: abdomen soft, NT, ND, BS+ Musculoskeletal: strength intact in all 4 Skin: moist, warm, no  rashes Severe lymphedema bilateral legs, but improved  ASSESSMENT: 1. DM2, insulin-dependent, uncontrolled, with complications - PN, on Neurontin  2. Hypothyroidism - On d.a.w. Synthroid    PLAN:  1. Patient with long-standing, with worse controlled diabetes after stopping Invokana. Since her sugars were in the 200s to 300s in a.m. (no sugar checks later in the day), we added long acting insulin at last visit. We discussed about different types of regimens, and she opted for NPH insulin which her husband takes. Her sugars have improved dramatically on NPH bid. - I advised her to:  Patient Instructions  Please continue: - Metformin 1000 mg 2x a day - Glipizide 10 mg 2x a day - NPH (Insulin N); - 25 units in am  - 20 units at bedtime  Please return in 3 months with your sugar log.   - advised her to continue checking her sugars at different times of the day - check 2 times a day, rotating checkso - UTD with eye exams - We reviewed her latest HbA1c which has increased, but sugars are now much improved and her HbA1c will most likely be much better - will check a new HbA1c at next visit - Return to clinic in 3 months with sugar log    2. Hypothyroidism - Reviewed the latest thyroid labs: TSH was increased, but we separated Ca and MVI from LT4 >> subsequent TSH normal 1 mo ago - discussed about taking the thyroid hormone every day, with water, >30 minutes before breakfast, separated by >4 hours from acid reflux medications, calcium, iron, multivitamins. She is now taking it correctly - continue 175 mcg of Synthroid daily  3. Obesity grade 3 - She appears to have gained approximately 30 pounds in the last 2.5 months.Marland KitchenMarland Kitchen

## 2015-06-09 ENCOUNTER — Encounter: Payer: Self-pay | Admitting: Registered Nurse

## 2015-06-09 ENCOUNTER — Encounter: Payer: Medicare Other | Attending: Physical Medicine & Rehabilitation | Admitting: Registered Nurse

## 2015-06-09 VITALS — BP 197/90 | HR 92 | Resp 16

## 2015-06-09 DIAGNOSIS — Z79899 Other long term (current) drug therapy: Secondary | ICD-10-CM | POA: Diagnosis not present

## 2015-06-09 DIAGNOSIS — M47817 Spondylosis without myelopathy or radiculopathy, lumbosacral region: Secondary | ICD-10-CM

## 2015-06-09 DIAGNOSIS — M25561 Pain in right knee: Secondary | ICD-10-CM | POA: Insufficient documentation

## 2015-06-09 DIAGNOSIS — M7541 Impingement syndrome of right shoulder: Secondary | ICD-10-CM | POA: Diagnosis not present

## 2015-06-09 DIAGNOSIS — Z5181 Encounter for therapeutic drug level monitoring: Secondary | ICD-10-CM | POA: Diagnosis not present

## 2015-06-09 DIAGNOSIS — G894 Chronic pain syndrome: Secondary | ICD-10-CM | POA: Diagnosis not present

## 2015-06-09 DIAGNOSIS — M75102 Unspecified rotator cuff tear or rupture of left shoulder, not specified as traumatic: Secondary | ICD-10-CM | POA: Diagnosis not present

## 2015-06-09 DIAGNOSIS — G8929 Other chronic pain: Secondary | ICD-10-CM | POA: Insufficient documentation

## 2015-06-09 DIAGNOSIS — M25562 Pain in left knee: Secondary | ICD-10-CM | POA: Diagnosis not present

## 2015-06-09 MED ORDER — MORPHINE SULFATE ER 30 MG PO TBCR: 30.0000 mg | EXTENDED_RELEASE_TABLET | Freq: Two times a day (BID) | ORAL | Status: DC

## 2015-06-09 MED ORDER — HYDROCODONE-ACETAMINOPHEN 10-325 MG PO TABS: 1.0000 | ORAL_TABLET | Freq: Three times a day (TID) | ORAL | Status: DC

## 2015-06-09 NOTE — Progress Notes (Signed)
Subjective:    Patient ID: Theresa Huffman, female    DOB: 1946/06/05, 69 y.o.   MRN: [id number]  HPI: Theresa Huffman is a 69 year old female who returns for follow up for chronic pain and medication refill. She says her pain is in her lower back and bilateral knees right > left. She rates her pain 8. Her current exercise regime is walking short distances using loftstrand crutches and performing chair exercises daily.  Also states she has gain 10 lbs due to late night snacking, we discussed healthy diet regime and various snacks as options. She verbalizes understanding.  Arrived to office hypertensive she had her anti-hypertensive this morning.  Encouraged to keep a blood pressure log, she has a apparatus and verbalizes understanding.   Spoke with [name] at 4:40PM to keep a blood pressure log she verbalizes understanding.  Pain Inventory Average Pain 8 Pain Right Now 8 My pain is constant, burning and aching  In the last 24 hours, has pain interfered with the following? General activity 9 Relation with others 6 Enjoyment of life 8 What TIME of day is your pain at its worst? ALL Sleep (in general) Poor  Pain is worse with: walking, bending, sitting, standing and some activites Pain improves with: rest and medication Relief from Meds: 6  Mobility walk without assistance use a cane how many minutes can you walk? 10 ability to climb steps?  no do you drive?  yes  Function disabled: date disabled .  Neuro/Psych weakness trouble walking depression  Prior Studies Any changes since last visit?  no  Physicians involved in your care Any changes since last visit?  no   Family History  Problem Relation Age of Onset  . Cancer Mother     lung and colon  . Stroke Sister    History   Social History  . Marital Status: Married    Spouse Name: N/A  . Number of Children: 1  . Years of Education: N/A   Occupational History  . Diability    Social  History Main Topics  . Smoking status: Never Smoker   . Smokeless tobacco: Never Used  . Alcohol Use: No  . Drug Use: No  . Sexual Activity: Not on file   Other Topics Concern  . None   Social History Narrative   Lives in [place], one adult child      Caffeine use: coffee daily in am   Past Surgical History  Procedure Laterality Date  . Cholecystectomy    . Tubal ligation     Past Medical History  Diagnosis Date  . Hyperlipidemia   . Hypertension   . Low back pain   . Morbid obesity   . Diabetes mellitus     type II  . Thyroid disease     hypothyroidism  . GERD (gastroesophageal reflux disease)   . Lymphedema   . Lumbosacral spondylosis without myelopathy   . Pain in limb   . Lumbago   . Facet syndrome, lumbar   . Lymphedema   . Primary localized osteoarthrosis, lower leg   . Hypercholesteremia    BP 197/90 mmHg  Pulse 92  Resp 16  SpO2 92%  Opioid Risk Score:   Fall Risk Score:  `1  Depression screen PHQ 2/9  No flowsheet data found.   Review of Systems  Constitutional: Positive for unexpected weight change.       Weight gain  HENT: Negative.   Eyes: Negative.  Respiratory: Positive for shortness of breath.   Cardiovascular: Negative.   Gastrointestinal: Negative.   Endocrine: Negative.   Genitourinary: Negative.   Musculoskeletal: Positive for myalgias, back pain and arthralgias.  Skin: Negative.   Allergic/Immunologic: Negative.   Neurological: Positive for weakness.       Trouble walking  Hematological: Negative.   Psychiatric/Behavioral: Positive for dysphoric mood.       Objective:   Physical Exam  Constitutional: She is oriented to person, place, and time. She appears well-developed and well-nourished.  HENT:  Head: Normocephalic and atraumatic.  Neck: Normal range of motion. Neck supple.  Cardiovascular: Normal rate and regular rhythm.   Pulmonary/Chest: Effort normal and breath sounds normal.  Musculoskeletal:  Normal  Muscle Bulk and Muscle Testing Reveals: Upper Extremities: Full ROM and Muscle Strength 5/5 Lower Extremities: Full ROM and Muscle Strength 5/5 Right Lower Extremity Flexion Produces Pain into Patella Arises from chair slowly/ Using Loftstrand crutches Antalgic Gait    Neurological: She is alert and oriented to person, place, and time.  Skin: Skin is warm and dry.  Psychiatric: She has a normal mood and affect.  Nursing note and vitals reviewed.         Assessment & Plan:  1. Lumbar pain chronic, no radiculopathy: Lumbar degenerative disc L4-5 and L5-S1.  Refilled: HYDRcodone 10/325mg  one tablet TID #90.  MS CONTIN 30 mg one every 12 hours #60.  2. Bilateral knee osteoarthritis: Continue with Exercise and Heat therapy. Continue current medication regime.  3. Morbid Obesity: Continue with Diabetic Diet Regime and Exercise Regimen.  20 minutes of face to face patient care time was spent during this visit. All questions were encouraged and answered.   F/U in 1 month

## 2015-06-24 ENCOUNTER — Other Ambulatory Visit: Payer: Self-pay | Admitting: *Deleted

## 2015-06-24 MED ORDER — GLIPIZIDE 10 MG PO TABS: 10.0000 mg | ORAL_TABLET | Freq: Two times a day (BID) | ORAL | Status: DC

## 2015-06-29 ENCOUNTER — Other Ambulatory Visit: Payer: Self-pay | Admitting: *Deleted

## 2015-06-29 MED ORDER — BACLOFEN 10 MG PO TABS: 10.0000 mg | ORAL_TABLET | Freq: Three times a day (TID) | ORAL | Status: DC

## 2015-07-16 ENCOUNTER — Encounter: Payer: Medicare Other | Attending: Physical Medicine & Rehabilitation | Admitting: Registered Nurse

## 2015-07-16 ENCOUNTER — Encounter: Payer: Self-pay | Admitting: Registered Nurse

## 2015-07-16 VITALS — BP 161/83 | HR 100 | Resp 14

## 2015-07-16 DIAGNOSIS — M47817 Spondylosis without myelopathy or radiculopathy, lumbosacral region: Secondary | ICD-10-CM | POA: Diagnosis not present

## 2015-07-16 DIAGNOSIS — M7541 Impingement syndrome of right shoulder: Secondary | ICD-10-CM | POA: Insufficient documentation

## 2015-07-16 DIAGNOSIS — Z5181 Encounter for therapeutic drug level monitoring: Secondary | ICD-10-CM | POA: Insufficient documentation

## 2015-07-16 DIAGNOSIS — I158 Other secondary hypertension: Secondary | ICD-10-CM

## 2015-07-16 DIAGNOSIS — G894 Chronic pain syndrome: Secondary | ICD-10-CM | POA: Diagnosis not present

## 2015-07-16 DIAGNOSIS — Z79899 Other long term (current) drug therapy: Secondary | ICD-10-CM

## 2015-07-16 DIAGNOSIS — G8929 Other chronic pain: Secondary | ICD-10-CM | POA: Diagnosis not present

## 2015-07-16 DIAGNOSIS — M25562 Pain in left knee: Secondary | ICD-10-CM | POA: Insufficient documentation

## 2015-07-16 DIAGNOSIS — M75102 Unspecified rotator cuff tear or rupture of left shoulder, not specified as traumatic: Secondary | ICD-10-CM | POA: Insufficient documentation

## 2015-07-16 DIAGNOSIS — M25561 Pain in right knee: Secondary | ICD-10-CM | POA: Insufficient documentation

## 2015-07-16 MED ORDER — HYDROCODONE-ACETAMINOPHEN 10-325 MG PO TABS: 1.0000 | ORAL_TABLET | Freq: Three times a day (TID) | ORAL | Status: DC

## 2015-07-16 MED ORDER — MORPHINE SULFATE ER 30 MG PO TBCR: 30.0000 mg | EXTENDED_RELEASE_TABLET | Freq: Two times a day (BID) | ORAL | Status: DC

## 2015-07-16 NOTE — Progress Notes (Signed)
Subjective:    Patient ID: Theresa Huffman, female    DOB: 1946/08/29, 69 y.o.   MRN: [id number]  HPI: Theresa Huffman is a 69 year old female who returns for follow up for chronic pain and medication refill. She says her pain is in her bilateral shoulder, lower back and bilateral knees right > left. She rates her pain 7. Her current exercise regime is walking short distances using loftstrand crutches and performing chair exercises daily.  Arrived to office hypertensive 174/84 blood pressure re-checke 161/83. She's compliant with her medication. Admits to being stressed with watching her great grandson. She keeps a blood pressure log and takes her blood pressure twice a day she will follow up with her PCP.  Pain Inventory Average Pain 7 Pain Right Now 7 My pain is burning and aching  In the last 24 hours, has pain interfered with the following? General activity 9 Relation with others 7 Enjoyment of life 7 What TIME of day is your pain at its worst? morning, evening  Sleep (in general) Fair  Pain is worse with: walking, bending and standing Pain improves with: rest and medication Relief from Meds: 6  Mobility walk with assistance ability to climb steps?  no do you drive?  yes use a wheelchair Do you have any goals in this area?  yes  Function disabled: date disabled . I need assistance with the following:  meal prep, household duties and shopping Do you have any goals in this area?  yes  Neuro/Psych weakness numbness trouble walking depression  Prior Studies Any changes since last visit?  no  Physicians involved in your care Any changes since last visit?  no   Family History  Problem Relation Age of Onset  . Cancer Mother     lung and colon  . Stroke Sister    Social History   Social History  . Marital Status: Married    Spouse Name: N/A  . Number of Children: 1  . Years of Education: N/A   Occupational History  . Diability    Social  History Main Topics  . Smoking status: Never Smoker   . Smokeless tobacco: Never Used  . Alcohol Use: No  . Drug Use: No  . Sexual Activity: Not Asked   Other Topics Concern  . None   Social History Narrative   Lives in [place], one adult child      Caffeine use: coffee daily in am   Past Surgical History  Procedure Laterality Date  . Cholecystectomy    . Tubal ligation     Past Medical History  Diagnosis Date  . Hyperlipidemia   . Hypertension   . Low back pain   . Morbid obesity   . Diabetes mellitus     type II  . Thyroid disease     hypothyroidism  . GERD (gastroesophageal reflux disease)   . Lymphedema   . Lumbosacral spondylosis without myelopathy   . Pain in limb   . Lumbago   . Facet syndrome, lumbar   . Lymphedema   . Primary localized osteoarthrosis, lower leg   . Hypercholesteremia    BP 174/84 mmHg  Pulse 100  Resp 14  SpO2 90%  Opioid Risk Score:   Fall Risk Score:  `1  Depression screen PHQ 2/9  Depression screen PHQ 2/9 07/16/2015  Decreased Interest 0  Down, Depressed, Hopeless 1  PHQ - 2 Score 1  Altered sleeping 1  Tired, decreased energy 1  Change in appetite 1  Feeling bad or failure about yourself  1  Trouble concentrating 0  Moving slowly or fidgety/restless 0  Suicidal thoughts 0  PHQ-9 Score 5  Difficult doing work/chores Somewhat difficult     Review of Systems  Musculoskeletal: Positive for gait problem.  Neurological: Positive for weakness and numbness.  Psychiatric/Behavioral: Positive for dysphoric mood.  All other systems reviewed and are negative.      Objective:   Physical Exam  Constitutional: She is oriented to person, place, and time. She appears well-developed and well-nourished.  HENT:  Head: Normocephalic and atraumatic.  Neck: Normal range of motion. Neck supple.  Cardiovascular: Normal rate and regular rhythm.   Pulmonary/Chest: Effort normal and breath sounds normal.  Musculoskeletal:    Normal Muscle Bulk and Muscle Testing Reveals: Upper Extremities: Full ROM and Muscle Strength 5/5 Thoracic Paraspinal Tenderness: T-1- T-3 Lower Extremities: Full ROM and Muscle Strength 5/5 Bilateral Lower Extremities Flexion Produces pain into Patella's Arises from chair slowly using Lofstrand Crutches Antalgic gait  Neurological: She is alert and oriented to person, place, and time.  Skin: Skin is warm and dry.  Psychiatric: She has a normal mood and affect.  Nursing note and vitals reviewed.         Assessment & Plan:  1. Lumbar pain chronic, no radiculopathy: Lumbar degenerative disc L4-5 and L5-S1.  Refilled: HYDRcodone 10/325mg  one tablet TID #90.  MS CONTIN 30 mg one every 12 hours #60.  2. Bilateral knee osteoarthritis: Continue with Exercise and Heat therapy. Continue current medication regime.  3. Morbid Obesity: Continue with Diabetic Diet Regime and Exercise Regimen.  20 minutes of face to face patient care time was spent during this visit. All questions were encouraged and answered.   F/U in 1 month

## 2015-08-04 ENCOUNTER — Other Ambulatory Visit: Payer: Self-pay | Admitting: Family Medicine

## 2015-08-19 ENCOUNTER — Other Ambulatory Visit: Payer: Self-pay | Admitting: *Deleted

## 2015-08-19 MED ORDER — METFORMIN HCL 1000 MG PO TABS: 1000.0000 mg | ORAL_TABLET | Freq: Two times a day (BID) | ORAL | Status: DC

## 2015-08-27 ENCOUNTER — Encounter: Payer: Self-pay | Admitting: Registered Nurse

## 2015-08-27 ENCOUNTER — Encounter: Payer: Medicare Other | Attending: Physical Medicine & Rehabilitation | Admitting: Registered Nurse

## 2015-08-27 VITALS — BP 150/74 | HR 90 | Resp 16 | Wt 376.6 lb

## 2015-08-27 DIAGNOSIS — G8929 Other chronic pain: Secondary | ICD-10-CM | POA: Diagnosis not present

## 2015-08-27 DIAGNOSIS — Z5181 Encounter for therapeutic drug level monitoring: Secondary | ICD-10-CM | POA: Insufficient documentation

## 2015-08-27 DIAGNOSIS — M25562 Pain in left knee: Secondary | ICD-10-CM | POA: Insufficient documentation

## 2015-08-27 DIAGNOSIS — M75102 Unspecified rotator cuff tear or rupture of left shoulder, not specified as traumatic: Secondary | ICD-10-CM | POA: Insufficient documentation

## 2015-08-27 DIAGNOSIS — M25561 Pain in right knee: Secondary | ICD-10-CM | POA: Diagnosis not present

## 2015-08-27 DIAGNOSIS — M47817 Spondylosis without myelopathy or radiculopathy, lumbosacral region: Secondary | ICD-10-CM

## 2015-08-27 DIAGNOSIS — Z79899 Other long term (current) drug therapy: Secondary | ICD-10-CM | POA: Diagnosis not present

## 2015-08-27 DIAGNOSIS — M7541 Impingement syndrome of right shoulder: Secondary | ICD-10-CM | POA: Insufficient documentation

## 2015-08-27 DIAGNOSIS — G894 Chronic pain syndrome: Secondary | ICD-10-CM | POA: Diagnosis not present

## 2015-08-27 MED ORDER — HYDROCODONE-ACETAMINOPHEN 10-325 MG PO TABS: 1.0000 | ORAL_TABLET | Freq: Three times a day (TID) | ORAL | Status: DC

## 2015-08-27 MED ORDER — MORPHINE SULFATE ER 30 MG PO TBCR: 30.0000 mg | EXTENDED_RELEASE_TABLET | Freq: Two times a day (BID) | ORAL | Status: DC

## 2015-08-27 NOTE — Progress Notes (Signed)
Subjective:    Patient ID: Theresa Huffman, female    DOB: [birth year], 69 y.o.   MRN: [id number]  HPI: Theresa Huffman is a 69 year old female who returns for follow up for chronic pain and medication refill. She says her pain is in her lower back and bilateral knees right > left. She rates her pain 8. Her current exercise regime is walking short distances using loftstrand crutches and performing chair exercises daily. Arrived to office desaturated oxygen saturation 89%, Oxygen re-checked 92%. Also states at home her oxygen saturation runs 88%. She refuses ED evaluation. Instructed to call her PCP and follow up she verbalizes understanding.  Pain Inventory Average Pain 8 Pain Right Now 8 My pain is burning and aching  In the last 24 hours, has pain interfered with the following? General activity 7 Relation with others 7 Enjoyment of life 7 What TIME of day is your pain at its worst? Morning and Evening Sleep (in general) Poor  Pain is worse with: walking, bending and standing Pain improves with: rest and medication Relief from Meds: 6  Mobility walk with assistance how many minutes can you walk? 10 ability to climb steps?  no do you drive?  yes use a wheelchair Do you have any goals in this area?  yes  Function disabled: date disabled NA I need assistance with the following:  meal prep, household duties and shopping Do you have any goals in this area?  yes  Neuro/Psych weakness tremor trouble walking depression  Prior Studies Any changes since last visit?  no  Physicians involved in your care Any changes since last visit?  no   Family History  Problem Relation Age of Onset  . Cancer Mother     lung and colon  . Stroke Sister    Social History   Social History  . Marital Status: Married    Spouse Name: N/A  . Number of Children: 1  . Years of Education: N/A   Occupational History  . Diability    Social History Main Topics  . Smoking  status: Never Smoker   . Smokeless tobacco: Never Used  . Alcohol Use: No  . Drug Use: No  . Sexual Activity: Not Asked   Other Topics Concern  . None   Social History Narrative   Lives in [place], one adult child      Caffeine use: coffee daily in am   Past Surgical History  Procedure Laterality Date  . Cholecystectomy    . Tubal ligation     Past Medical History  Diagnosis Date  . Hyperlipidemia   . Hypertension   . Low back pain   . Morbid obesity ([place])   . Diabetes mellitus     type II  . Thyroid disease     hypothyroidism  . GERD (gastroesophageal reflux disease)   . Lymphedema   . Lumbosacral spondylosis without myelopathy   . Pain in limb   . Lumbago   . Facet syndrome, lumbar   . Lymphedema   . Primary localized osteoarthrosis, lower leg   . Hypercholesteremia    BP 158/75 mmHg  Pulse 113  Resp 16  Wt 376 lb 9.6 oz (170.825 kg)  SpO2 89%  Opioid Risk Score:   Fall Risk Score:  `1  Depression screen PHQ 2/9  Depression screen PHQ 2/9 07/16/2015  Decreased Interest 0  Down, Depressed, Hopeless 1  PHQ - 2 Score 1  Altered sleeping 1  Tired,  decreased energy 1  Change in appetite 1  Feeling bad or failure about yourself  1  Trouble concentrating 0  Moving slowly or fidgety/restless 0  Suicidal thoughts 0  PHQ-9 Score 5  Difficult doing work/chores Somewhat difficult     Review of Systems  Neurological: Positive for weakness and numbness.       Gait Instability  Psychiatric/Behavioral:       Depression  All other systems reviewed and are negative.      Objective:   Physical Exam  Constitutional: She is oriented to person, place, and time. She appears well-developed and well-nourished.  HENT:  Head: Normocephalic and atraumatic.  Neck: Normal range of motion. Neck supple.  Cardiovascular: Normal rate and regular rhythm.   Pulmonary/Chest: Effort normal and breath sounds normal.  Musculoskeletal:  Normal Muscle Bulk and Muscle  Testing Reveals: Upper Extremities: Full ROM and Muscle Strength 5/5 Lumbar Paraspinal Tenderness: L-4- L-5 Lower Extremities: Full ROM and Muscle Strength 5/5 Right Lower Extremity Flexion Produces Pain into Patella Arises from chair slowly using Lofstrand crutches Antalgic Gait  Neurological: She is alert and oriented to person, place, and time.  Skin: Skin is warm and dry.  Nursing note and vitals reviewed.         Assessment & Plan:  1. Lumbar pain chronic, no radiculopathy: Lumbar degenerative disc L4-5 and L5-S1.  Refilled: HYDRcodone 10/325mg  one tablet TID #90.  MS CONTIN 30 mg one every 12 hours #60.  2. Bilateral knee osteoarthritis: Continue with Exercise and Heat therapy. Continue current medication regime.  3. Morbid Obesity: Continue with Diabetic Diet Regime and Exercise Regimen.  20 minutes of face to face patient care time was spent during this visit. All questions were encouraged and answered.   F/U in 1 month

## 2015-08-28 ENCOUNTER — Ambulatory Visit: Payer: Medicare Other | Admitting: Internal Medicine

## 2015-09-16 ENCOUNTER — Other Ambulatory Visit: Payer: Self-pay | Admitting: *Deleted

## 2015-09-16 MED ORDER — GLIPIZIDE 10 MG PO TABS: 10.0000 mg | ORAL_TABLET | Freq: Two times a day (BID) | ORAL | Status: DC

## 2015-09-22 ENCOUNTER — Other Ambulatory Visit: Payer: Self-pay | Admitting: Physical Medicine & Rehabilitation

## 2015-09-22 ENCOUNTER — Other Ambulatory Visit: Payer: Self-pay | Admitting: Family Medicine

## 2015-09-28 ENCOUNTER — Encounter: Payer: Medicare Other | Attending: Physical Medicine & Rehabilitation | Admitting: Registered Nurse

## 2015-09-28 ENCOUNTER — Other Ambulatory Visit: Payer: Self-pay | Admitting: Physical Medicine & Rehabilitation

## 2015-09-28 ENCOUNTER — Encounter: Payer: Self-pay | Admitting: Registered Nurse

## 2015-09-28 VITALS — BP 153/75 | HR 98 | Resp 16

## 2015-09-28 DIAGNOSIS — M7541 Impingement syndrome of right shoulder: Secondary | ICD-10-CM | POA: Diagnosis not present

## 2015-09-28 DIAGNOSIS — G894 Chronic pain syndrome: Secondary | ICD-10-CM

## 2015-09-28 DIAGNOSIS — M25562 Pain in left knee: Secondary | ICD-10-CM

## 2015-09-28 DIAGNOSIS — M75102 Unspecified rotator cuff tear or rupture of left shoulder, not specified as traumatic: Secondary | ICD-10-CM | POA: Diagnosis not present

## 2015-09-28 DIAGNOSIS — G8929 Other chronic pain: Secondary | ICD-10-CM | POA: Diagnosis not present

## 2015-09-28 DIAGNOSIS — M25561 Pain in right knee: Secondary | ICD-10-CM | POA: Insufficient documentation

## 2015-09-28 DIAGNOSIS — Z5181 Encounter for therapeutic drug level monitoring: Secondary | ICD-10-CM | POA: Diagnosis not present

## 2015-09-28 DIAGNOSIS — M47817 Spondylosis without myelopathy or radiculopathy, lumbosacral region: Secondary | ICD-10-CM

## 2015-09-28 DIAGNOSIS — Z79899 Other long term (current) drug therapy: Secondary | ICD-10-CM | POA: Diagnosis not present

## 2015-09-28 MED ORDER — MORPHINE SULFATE ER 30 MG PO TBCR: 30.0000 mg | EXTENDED_RELEASE_TABLET | Freq: Two times a day (BID) | ORAL | Status: DC

## 2015-09-28 MED ORDER — HYDROCODONE-ACETAMINOPHEN 10-325 MG PO TABS: 1.0000 | ORAL_TABLET | Freq: Three times a day (TID) | ORAL | Status: DC

## 2015-09-28 NOTE — Progress Notes (Signed)
Subjective:    Patient ID: Theresa Huffman, female    DOB: 05-28-1946, 69 y.o.   MRN: [id number]  HPI: [name] is a 69 year old female who returns for follow up for chronic pain and medication refill. She says her pain is in her lower back and right knee. She rates her pain 8. Her current exercise regime is walking short distances using lofstrand crutches and performing chair exercises daily.   Pain Inventory Average Pain 8 Pain Right Now 8 My pain is burning and aching  In the last 24 hours, has pain interfered with the following? General activity 8 Relation with others 6 Enjoyment of life 7 What TIME of day is your pain at its worst? morning, evening Sleep (in general) NA  Pain is worse with: na Pain improves with: na Relief from Meds: 6  Mobility use a cane how many minutes can you walk? 10 ability to climb steps?  no do you drive?  yes use a wheelchair needs help with transfers Do you have any goals in this area?  yes  Function disabled: date disabled na I need assistance with the following:  meal prep, household duties and shopping Do you have any goals in this area?  yes  Neuro/Psych numbness trouble walking depression  Prior Studies Any changes since last visit?  no  Physicians involved in your care Any changes since last visit?  no   Family History  Problem Relation Age of Onset  . Cancer Mother     lung and colon  . Stroke Sister    Social History   Social History  . Marital Status: Married    Spouse Name: N/A  . Number of Children: 1  . Years of Education: N/A   Occupational History  . Diability    Social History Main Topics  . Smoking status: Never Smoker   . Smokeless tobacco: Never Used  . Alcohol Use: No  . Drug Use: No  . Sexual Activity: Not Asked   Other Topics Concern  . None   Social History Narrative   Lives in [place], one adult child      Caffeine use: coffee daily in am   Past Surgical  History  Procedure Laterality Date  . Cholecystectomy    . Tubal ligation     Past Medical History  Diagnosis Date  . Hyperlipidemia   . Hypertension   . Low back pain   . Morbid obesity ([place])   . Diabetes mellitus     type II  . Thyroid disease     hypothyroidism  . GERD (gastroesophageal reflux disease)   . Lymphedema   . Lumbosacral spondylosis without myelopathy   . Pain in limb   . Lumbago   . Facet syndrome, lumbar   . Lymphedema   . Primary localized osteoarthrosis, lower leg   . Hypercholesteremia    BP 153/75 mmHg  Pulse 98  Resp 16  SpO2 91%  Opioid Risk Score:   Fall Risk Score:  `1  Depression screen PHQ 2/9  Depression screen PHQ 2/9 07/16/2015  Decreased Interest 0  Down, Depressed, Hopeless 1  PHQ - 2 Score 1  Altered sleeping 1  Tired, decreased energy 1  Change in appetite 1  Feeling bad or failure about yourself  1  Trouble concentrating 0  Moving slowly or fidgety/restless 0  Suicidal thoughts 0  PHQ-9 Score 5  Difficult doing work/chores Somewhat difficult      Review  of Systems  Musculoskeletal: Positive for gait problem.  Neurological: Positive for numbness.  Psychiatric/Behavioral: Positive for dysphoric mood.  All other systems reviewed and are negative.      Objective:   Physical Exam  Constitutional: She is oriented to person, place, and time. She appears well-developed and well-nourished.  HENT:  Head: Normocephalic and atraumatic.  Neck: Normal range of motion. Neck supple.  Cardiovascular: Normal rate and regular rhythm.   Pulmonary/Chest: Effort normal and breath sounds normal.  Musculoskeletal:  Normal Muscle Bulk and Muscle Testing Reveals: Upper Extremities: Full ROM and Muscle Strength 5/5 Lumbar Paraspinal Tenderness: L-4- L-5 Lower Extremities: Full ROM and Muscle Strength 5/5 Right Lower Extremity Flexuion Produces pain into Right Patella Arises from chair slowly using Lofstrand Crutches Antalgic gait    Neurological: She is alert and oriented to person, place, and time.  Skin: Skin is warm and dry.  Psychiatric: She has a normal mood and affect.  Nursing note and vitals reviewed.         Assessment & Plan:  1. Lumbar pain chronic, no radiculopathy: Lumbar degenerative disc L4-5 and L5-S1.  Refilled: HYDRcodone 10/325mg  one tablet TID #90.  MS CONTIN 30 mg one every 12 hours #60.  2. Bilateral knee osteoarthritis: Continue with Exercise and Heat therapy. Continue current medication regime.  3. Morbid Obesity: Continue with Diabetic Diet Regime and Exercise Regimen.  20 minutes of face to face patient care time was spent during this visit. All questions were encouraged and answered.   F/U in 1 month

## 2015-09-29 LAB — PMP ALCOHOL METABOLITE (ETG): Ethyl Glucuronide (EtG): NEGATIVE ng/mL

## 2015-10-01 ENCOUNTER — Ambulatory Visit: Payer: Medicare Other | Admitting: Registered Nurse

## 2015-10-02 LAB — OPIATES/OPIOIDS (LC/MS-MS)
CODEINE URINE: NEGATIVE ng/mL (ref <50)
HYDROMORPHONE: 269 ng/mL (ref <50)
Hydrocodone: 1665 ng/mL (ref <50)
Morphine Urine: 15290 ng/mL (ref <50)
NORHYDROCODONE, UR: 561 ng/mL (ref <50)
Noroxycodone, Ur: NEGATIVE ng/mL (ref <50)
Oxycodone, ur: NEGATIVE ng/mL (ref <50)
Oxymorphone: NEGATIVE ng/mL (ref <50)

## 2015-10-03 LAB — PRESCRIPTION MONITORING PROFILE (SOLSTAS)
Amphetamine/Meth: NEGATIVE ng/mL
Barbiturate Screen, Urine: NEGATIVE ng/mL
Benzodiazepine Screen, Urine: NEGATIVE ng/mL
Buprenorphine, Urine: NEGATIVE ng/mL
COCAINE METABOLITES: NEGATIVE ng/mL
Cannabinoid Scrn, Ur: NEGATIVE ng/mL
Carisoprodol, Urine: NEGATIVE ng/mL
Creatinine, Urine: 42.38 mg/dL (ref >20.0)
ECSTASY: NEGATIVE ng/mL
Fentanyl, Ur: NEGATIVE ng/mL
METHADONE SCREEN, URINE: NEGATIVE ng/mL
Meperidine, Ur: NEGATIVE ng/mL
Nitrites, Initial: NEGATIVE ug/mL
Oxycodone Screen, Ur: NEGATIVE ng/mL
PH URINE, INITIAL: 4.6 pH (ref 4.5–8.9)
Propoxyphene: NEGATIVE ng/mL
TRAMADOL UR: NEGATIVE ng/mL
Tapentadol, urine: NEGATIVE ng/mL
Zolpidem, Urine: NEGATIVE ng/mL

## 2015-10-06 ENCOUNTER — Other Ambulatory Visit: Payer: Self-pay | Admitting: Family Medicine

## 2015-10-07 NOTE — Progress Notes (Signed)
Urine drug screen for this encounter is consistent for prescribed medication 

## 2015-10-12 DIAGNOSIS — Z743 Need for continuous supervision: Secondary | ICD-10-CM | POA: Diagnosis not present

## 2015-10-12 DIAGNOSIS — R000 Tachycardia, unspecified: Secondary | ICD-10-CM | POA: Diagnosis not present

## 2015-10-12 DIAGNOSIS — R112 Nausea with vomiting, unspecified: Secondary | ICD-10-CM | POA: Diagnosis not present

## 2015-10-16 ENCOUNTER — Ambulatory Visit (INDEPENDENT_AMBULATORY_CARE_PROVIDER_SITE_OTHER): Payer: Medicare Other

## 2015-10-16 DIAGNOSIS — Z23 Encounter for immunization: Secondary | ICD-10-CM | POA: Diagnosis not present

## 2015-10-20 ENCOUNTER — Ambulatory Visit: Payer: Medicare Other | Admitting: Internal Medicine

## 2015-10-27 ENCOUNTER — Encounter: Payer: Self-pay | Admitting: Physical Medicine & Rehabilitation

## 2015-10-27 ENCOUNTER — Ambulatory Visit (HOSPITAL_BASED_OUTPATIENT_CLINIC_OR_DEPARTMENT_OTHER): Payer: Medicare Other | Admitting: Physical Medicine & Rehabilitation

## 2015-10-27 ENCOUNTER — Other Ambulatory Visit: Payer: Self-pay | Admitting: Physical Medicine & Rehabilitation

## 2015-10-27 ENCOUNTER — Encounter: Payer: Medicare Other | Attending: Physical Medicine & Rehabilitation

## 2015-10-27 VITALS — BP 172/90 | HR 92 | Resp 14

## 2015-10-27 DIAGNOSIS — G8929 Other chronic pain: Secondary | ICD-10-CM | POA: Insufficient documentation

## 2015-10-27 DIAGNOSIS — M25561 Pain in right knee: Secondary | ICD-10-CM | POA: Diagnosis not present

## 2015-10-27 DIAGNOSIS — M25562 Pain in left knee: Secondary | ICD-10-CM | POA: Insufficient documentation

## 2015-10-27 DIAGNOSIS — M75102 Unspecified rotator cuff tear or rupture of left shoulder, not specified as traumatic: Secondary | ICD-10-CM | POA: Insufficient documentation

## 2015-10-27 DIAGNOSIS — Z9989 Dependence on other enabling machines and devices: Secondary | ICD-10-CM | POA: Diagnosis not present

## 2015-10-27 DIAGNOSIS — M174 Other bilateral secondary osteoarthritis of knee: Secondary | ICD-10-CM

## 2015-10-27 DIAGNOSIS — Z5181 Encounter for therapeutic drug level monitoring: Secondary | ICD-10-CM | POA: Insufficient documentation

## 2015-10-27 DIAGNOSIS — Z79899 Other long term (current) drug therapy: Secondary | ICD-10-CM | POA: Insufficient documentation

## 2015-10-27 DIAGNOSIS — M7541 Impingement syndrome of right shoulder: Secondary | ICD-10-CM | POA: Insufficient documentation

## 2015-10-27 DIAGNOSIS — M5137 Other intervertebral disc degeneration, lumbosacral region: Secondary | ICD-10-CM | POA: Diagnosis not present

## 2015-10-27 DIAGNOSIS — R112 Nausea with vomiting, unspecified: Secondary | ICD-10-CM | POA: Diagnosis not present

## 2015-10-27 DIAGNOSIS — K2970 Gastritis, unspecified, without bleeding: Secondary | ICD-10-CM | POA: Diagnosis not present

## 2015-10-27 MED ORDER — HYDROCODONE-ACETAMINOPHEN 10-325 MG PO TABS: 1.0000 | ORAL_TABLET | Freq: Three times a day (TID) | ORAL | Status: DC

## 2015-10-27 MED ORDER — MORPHINE SULFATE ER 30 MG PO TBCR: 30.0000 mg | EXTENDED_RELEASE_TABLET | Freq: Two times a day (BID) | ORAL | Status: DC

## 2015-10-27 NOTE — Patient Instructions (Signed)
Opioid medications are helpful for acute pain . Opioids medications have a modest effect on chronic pain. Up to 50% of patients do not tolerate the side effects of constipation, drowsiness, depressive effects. The average benefit for pain reduction is only 10-30%. Approximately 10% of patients become addicted. Cannot drink alcohol with opioids. Should not use medicines like Valium, Xanax, or Ativan with opioids. With schedule 2 medications, monthly visits and prescriptions are required Intermittent urine drug screens are needed. 

## 2015-10-27 NOTE — Progress Notes (Signed)
Subjective:    Patient ID: Theresa Huffman, female    DOB: [birth year], 69 y.o.   MRN: [id number]  HPI Current exercises include arm exercises performed in a sitting position as well as leg lifts performed in a sitting position. These are done twice a day every day.  Weight is unchanged.   Denies any constipation. Pain Inventory Average Pain 8 Pain Right Now 8 My pain is burning and aching  In the last 24 hours, has pain interfered with the following? General activity NA Relation with others NA Enjoyment of life NA What TIME of day is your pain at its worst? NA Sleep (in general) NA  Pain is worse with: NA Pain improves with: NA Relief from Meds: 6  Mobility walk with assistance ability to climb steps?  no do you drive?  yes use a wheelchair transfers alone Do you have any goals in this area?  yes  Function disabled: date disabled NA I need assistance with the following:  meal prep, household duties and shopping  Neuro/Psych weakness numbness trouble walking depression  Prior Studies Any changes since last visit?  no  Physicians involved in your care Any changes since last visit?  no   Family History  Problem Relation Age of Onset  . Cancer Mother     lung and colon  . Stroke Sister    Social History   Social History  . Marital Status: Married    Spouse Name: N/A  . Number of Children: 1  . Years of Education: N/A   Occupational History  . Diability    Social History Main Topics  . Smoking status: Never Smoker   . Smokeless tobacco: Never Used  . Alcohol Use: No  . Drug Use: No  . Sexual Activity: Not Asked   Other Topics Concern  . None   Social History Narrative   Lives in [place], one adult child      Caffeine use: coffee daily in am   Past Surgical History  Procedure Laterality Date  . Cholecystectomy    . Tubal ligation     Past Medical History  Diagnosis Date  . Hyperlipidemia   . Hypertension   . Low back  pain   . Morbid obesity ([place])   . Diabetes mellitus     type II  . Thyroid disease     hypothyroidism  . GERD (gastroesophageal reflux disease)   . Lymphedema   . Lumbosacral spondylosis without myelopathy   . Pain in limb   . Lumbago   . Facet syndrome, lumbar   . Lymphedema   . Primary localized osteoarthrosis, lower leg   . Hypercholesteremia    BP 172/90 mmHg  Pulse 92  Resp 14  SpO2 92%  Opioid Risk Score:   Fall Risk Score:  `1  Depression screen PHQ 2/9  Depression screen PHQ 2/9 07/16/2015  Decreased Interest 0  Down, Depressed, Hopeless 1  PHQ - 2 Score 1  Altered sleeping 1  Tired, decreased energy 1  Change in appetite 1  Feeling bad or failure about yourself  1  Trouble concentrating 0  Moving slowly or fidgety/restless 0  Suicidal thoughts 0  PHQ-9 Score 5  Difficult doing work/chores Somewhat difficult     Review of Systems  Musculoskeletal: Positive for gait problem.  Neurological: Positive for weakness and numbness.  Psychiatric/Behavioral: Positive for dysphoric mood.  All other systems reviewed and are negative.      Objective:   Physical Exam  Lymphedema right greater than left lower extremity, posterior thigh Panniculus impeding knee flexion.  Valgus deformity right knee. Crepitus at the right knee. No erythema in both lower extremities. Negative straight leg raising Full knee extension bilaterally  Upper extremity range of motion is full. Upper extremity strength is normal Lower extremity strength is 4/5 and hip flexor and knee extensor and ankle dorsiflexor within available range. Lower extremity light touch sensation is intact      Assessment & Plan:  1. Lumbar pain chronic, no radiculopathy: Lumbar degenerative disc L4-5 and L5-S1.  Refilled: HYDRcodone 10/325mg  one tablet TID #90.  MS CONTIN 30 mg one every 12 hours #60.  Continue opioid monitoring program. This consists of regular clinic visits, examinations, urine  drug screen, pill counts as well as use of [place] controlled substance reporting System. Last urine drug screen 09/28/2015 which was consistent with the medications prescribed  Reviewed Information regarding chronic opioids Opioid medications are helpful for acute pain . Opioids medications have a modest effect on chronic pain. Up to 50% of patients do not tolerate the side effects of constipation, drowsiness, depressive effects. The average benefit for pain reduction is only 10-30%. Approximately 10% of patients become addicted. Cannot drink alcohol with opioids. Should not use medicines like Valium, Xanax, or Ativan with opioids. With schedule 2 medications, monthly visits and prescriptions are required Intermittent urine drug screens are needed. 2. Bilateral knee osteoarthritis: Continue with Exercise and Heat therapy. Continue current medication regime.  3. Morbid Obesity: Continue with Diabetic Diet Regime and Exercise Regimen.  All questions were encouraged and answered.   F/U in 1 month

## 2015-10-28 ENCOUNTER — Other Ambulatory Visit: Payer: Self-pay | Admitting: Internal Medicine

## 2015-11-03 ENCOUNTER — Other Ambulatory Visit: Payer: Self-pay | Admitting: Family Medicine

## 2015-11-04 NOTE — Telephone Encounter (Signed)
pts last K+ 04/2015-normal. pls advise

## 2015-11-16 ENCOUNTER — Other Ambulatory Visit: Payer: Self-pay | Admitting: Internal Medicine

## 2015-11-16 ENCOUNTER — Other Ambulatory Visit: Payer: Self-pay | Admitting: Family Medicine

## 2015-11-17 ENCOUNTER — Other Ambulatory Visit: Payer: Self-pay | Admitting: Registered Nurse

## 2015-11-18 ENCOUNTER — Other Ambulatory Visit: Payer: Self-pay | Admitting: *Deleted

## 2015-11-18 MED ORDER — BACLOFEN 10 MG PO TABS: 10.0000 mg | ORAL_TABLET | Freq: Three times a day (TID) | ORAL | Status: DC

## 2015-11-23 ENCOUNTER — Other Ambulatory Visit: Payer: Self-pay | Admitting: Internal Medicine

## 2015-11-24 ENCOUNTER — Encounter: Payer: Medicare Other | Attending: Physical Medicine & Rehabilitation

## 2015-11-24 ENCOUNTER — Encounter: Payer: Self-pay | Admitting: Physical Medicine & Rehabilitation

## 2015-11-24 ENCOUNTER — Ambulatory Visit (HOSPITAL_BASED_OUTPATIENT_CLINIC_OR_DEPARTMENT_OTHER): Payer: Medicare Other | Admitting: Physical Medicine & Rehabilitation

## 2015-11-24 VITALS — BP 151/75 | HR 89 | Wt 371.2 lb

## 2015-11-24 DIAGNOSIS — M75102 Unspecified rotator cuff tear or rupture of left shoulder, not specified as traumatic: Secondary | ICD-10-CM | POA: Insufficient documentation

## 2015-11-24 DIAGNOSIS — Z5181 Encounter for therapeutic drug level monitoring: Secondary | ICD-10-CM | POA: Insufficient documentation

## 2015-11-24 DIAGNOSIS — G8929 Other chronic pain: Secondary | ICD-10-CM | POA: Insufficient documentation

## 2015-11-24 DIAGNOSIS — M25561 Pain in right knee: Secondary | ICD-10-CM | POA: Diagnosis not present

## 2015-11-24 DIAGNOSIS — M25562 Pain in left knee: Secondary | ICD-10-CM | POA: Diagnosis not present

## 2015-11-24 DIAGNOSIS — Z79899 Other long term (current) drug therapy: Secondary | ICD-10-CM | POA: Diagnosis not present

## 2015-11-24 DIAGNOSIS — M7541 Impingement syndrome of right shoulder: Secondary | ICD-10-CM | POA: Insufficient documentation

## 2015-11-24 MED ORDER — HYDROCODONE-ACETAMINOPHEN 10-325 MG PO TABS: 1.0000 | ORAL_TABLET | Freq: Three times a day (TID) | ORAL | Status: DC

## 2015-11-24 MED ORDER — MORPHINE SULFATE ER 30 MG PO TBCR: 30.0000 mg | EXTENDED_RELEASE_TABLET | Freq: Two times a day (BID) | ORAL | Status: DC

## 2015-11-24 NOTE — Patient Instructions (Signed)
Continue your exercises. Do them once or twice a day

## 2015-11-24 NOTE — Progress Notes (Signed)
Subjective:    Patient ID: Theresa Huffman, female    DOB: [birth year], 70 y.o.   MRN: [id number]  HPI 70 year old female with chronic knee pain due to osteoarthritis. She has had subacromial bursitis of the shoulder but this has improved. Weight has been steady. No falls. No side effects from the morphine or hydrocodone.  Patient does a seated exercise program moving the arms and legs.  Patient is able to do all her self-care as well as for mobility using Loftstrand crutches however she is unable to do housework or cooking Patient is still driving. Pain Inventory Average Pain 8 Pain Right Now 8 My pain is burning and aching  In the last 24 hours, has pain interfered with the following? General activity 8 Relation with others 6 Enjoyment of life 8 What TIME of day is your pain at its worst? morning and evening Sleep (in general) Poor  Pain is worse with: walking, bending, standing and some activites Pain improves with: rest and medication Relief from Meds: 6  Mobility walk with assistance how many minutes can you walk? 10 ability to climb steps?  no do you drive?  yes  Function disabled: date disabled . I need assistance with the following:  meal prep, household duties and shopping  Neuro/Psych weakness numbness trouble walking depression  Prior Studies Any changes since last visit?  no  Physicians involved in your care Any changes since last visit?  no   Family History  Problem Relation Age of Onset  . Cancer Mother     lung and colon  . Stroke Sister    Social History   Social History  . Marital Status: Married    Spouse Name: N/A  . Number of Children: 1  . Years of Education: N/A   Occupational History  . Diability    Social History Main Topics  . Smoking status: Never Smoker   . Smokeless tobacco: Never Used  . Alcohol Use: No  . Drug Use: No  . Sexual Activity: Not Asked   Other Topics Concern  . None   Social History Narrative     Lives in [place], one adult child      Caffeine use: coffee daily in am   Past Surgical History  Procedure Laterality Date  . Cholecystectomy    . Tubal ligation     Past Medical History  Diagnosis Date  . Hyperlipidemia   . Hypertension   . Low back pain   . Morbid obesity ([place])   . Diabetes mellitus     type II  . Thyroid disease     hypothyroidism  . GERD (gastroesophageal reflux disease)   . Lymphedema   . Lumbosacral spondylosis without myelopathy   . Pain in limb   . Lumbago   . Facet syndrome, lumbar   . Lymphedema   . Primary localized osteoarthrosis, lower leg   . Hypercholesteremia    BP 151/75 mmHg  Pulse 89  SpO2 92%  Opioid Risk Score:   Fall Risk Score:  `1  Depression screen PHQ 2/9  Depression screen [facility] 2/9 11/24/2015 07/16/2015  Decreased Interest 0 0  Down, Depressed, Hopeless 1 1  PHQ - 2 Score 1 1  Altered sleeping - 1  Tired, decreased energy - 1  Change in appetite - 1  Feeling bad or failure about yourself  - 1  Trouble concentrating - 0  Moving slowly or fidgety/restless - 0  Suicidal thoughts - 0  PHQ-9 Score -  5  Difficult doing work/chores - Somewhat difficult     Review of Systems  All other systems reviewed and are negative.      Objective:   Physical Exam  Constitutional: She is oriented to person, place, and time. She appears well-developed and well-nourished.  HENT:  Head: Normocephalic and atraumatic.  Eyes: Conjunctivae and EOM are normal. Pupils are equal, round, and reactive to light.  Neck: Normal range of motion.  Musculoskeletal:  No tenderness to palpation over the medial or lateral joint line. No tenderness over the patella.  Neurological: She is alert and oriented to person, place, and time.  Her strength is 4/5 bilateral hip flexor and extensor ankle dorsiflexor 5/5 in the upper limbs  Ambulance with bilateral Lofstrand crutches.  Psychiatric: She has a normal mood and affect.  Nursing note and  vitals reviewed.         Assessment & Plan:   1. Chronic severe osteoarthritis of both knees.Nonsurgical candidate at the current time due to her weight. She is trying to do some additional weight loss. No improvement after corticosteroid injections or Synvisc The current medical regimen is effective;  continue present plan and medications. Morphine sulfate 30 g every 12 hours Continue hydrocodone 10 mg every 8 hours

## 2015-11-25 ENCOUNTER — Telehealth: Payer: Self-pay | Admitting: Physical Medicine & Rehabilitation

## 2015-11-25 NOTE — Telephone Encounter (Signed)
Patient is needing a prior auth for her Morphine and Hydrocodone.  Please call patient at [phone].

## 2015-12-01 NOTE — Telephone Encounter (Signed)
Prior authorizations completed today

## 2015-12-14 ENCOUNTER — Other Ambulatory Visit: Payer: Self-pay | Admitting: Internal Medicine

## 2015-12-14 ENCOUNTER — Other Ambulatory Visit: Payer: Self-pay | Admitting: Family Medicine

## 2015-12-15 ENCOUNTER — Encounter: Payer: Self-pay | Admitting: Internal Medicine

## 2015-12-15 ENCOUNTER — Other Ambulatory Visit (INDEPENDENT_AMBULATORY_CARE_PROVIDER_SITE_OTHER): Payer: Medicare Other | Admitting: *Deleted

## 2015-12-15 ENCOUNTER — Ambulatory Visit (INDEPENDENT_AMBULATORY_CARE_PROVIDER_SITE_OTHER): Payer: Medicare Other | Admitting: Internal Medicine

## 2015-12-15 VITALS — BP 110/64 | HR 94 | Temp 97.9°F | Resp 12 | Wt 374.0 lb

## 2015-12-15 DIAGNOSIS — E039 Hypothyroidism, unspecified: Secondary | ICD-10-CM

## 2015-12-15 DIAGNOSIS — E1165 Type 2 diabetes mellitus with hyperglycemia: Secondary | ICD-10-CM

## 2015-12-15 DIAGNOSIS — E1149 Type 2 diabetes mellitus with other diabetic neurological complication: Secondary | ICD-10-CM | POA: Diagnosis not present

## 2015-12-15 DIAGNOSIS — E1140 Type 2 diabetes mellitus with diabetic neuropathy, unspecified: Secondary | ICD-10-CM

## 2015-12-15 DIAGNOSIS — IMO0002 Reserved for concepts with insufficient information to code with codable children: Secondary | ICD-10-CM

## 2015-12-15 LAB — POCT GLYCOSYLATED HEMOGLOBIN (HGB A1C): HEMOGLOBIN A1C: 6.1

## 2015-12-15 LAB — TSH: TSH: 4.27 u[IU]/mL (ref 0.35–4.50)

## 2015-12-15 LAB — T4, FREE: Free T4: 1.2 ng/dL (ref 0.60–1.60)

## 2015-12-15 NOTE — Progress Notes (Signed)
Patient ID: Theresa Huffman, female   DOB: 05-23-1946, 70 y.o.   MRN: [id number]  HPI: Theresa Huffman is a 70 y.o.-year-old female, returning for f/u for DM2, dx 2010, insulin-dependent, uncontrolled, with complications (Peripheral neuropathy, mild CKD) and hypothyroidism. Last visit 7 mo ago.   DM2: Last hemoglobin A1c was: Lab Results  Component Value Date   HGBA1C 9.2* 03/10/2015   HGBA1C 8.8* 10/02/2014   HGBA1C 8.6* 06/12/2014  Prev. HbA1C was 8.5% in 04/2011. She believes her HbA1c increased due to getting back to eating sweats.   Pt is on a regimen of: - Metformin 1000 bid - Glipizide 5 bid - NPH insulin - 25 units in am  - 15-20 units at bedtime She was on Invokana 300 mg - restarted at last visit as she had run out of it, but she developed a yeast inf >> stopped it 08/26/2014 We started Cycloset - 1.6 mg >> ran out of it and did not refill We had to stop Januvia 100 - expensive. She is in the doughnut hole by the end of the year.   Pt checks her sugars once a day and they are: - am: 91-155 (195) >> 118, 144-181, 204 >> 140-187 >> 176, 194-352 >> 87, 100--146 >> 79, 83, 94-145 - before lunch: 208 >> 186 >> n/c - before dinner: 125-159 >> 114-163 >> n/c >> 97-150 >> 103-122 - 2h after dinner: 225 >> 139-189 >> n/c - bedtime: 175-205 >> 142-189 >> 115, 138, 141 >> n/c >> 142-187 >> n/c >> 93-182, 206 if eats out or chinese >> 127-186 - nighttime: 153 >> n/c No lows. Lowest sugar was 87 >> 79 ; she has hypoglycemia awareness at 80. Highest sugar was low 200s.  She was considering GBP, but is afraid of the sx.   - has mild CKD, last BUN/creatinine:  Lab Results  Component Value Date   BUN 14 04/23/2015   CREATININE 0.90 04/23/2015  Has h/o angioedema - ACEI stopped - last set of lipids: Lab Results  Component Value Date   CHOL 150 04/23/2015   HDL 47.10 04/23/2015   LDLCALC 68 04/23/2015   LDLDIRECT 92.5 08/25/2014   TRIG 177.0* 04/23/2015   CHOLHDL 3  04/23/2015  She is on Zocor. She takes ASA 81. - last eye exam was in 04/20/2015. No DR.  - + numbness and tingling in her feet - improved   Pt also has a history of HTN, Hypothyroidism, vit D def., HL, morbid obesity, lymphedema, vv insuff., GERD, lumbago, chronic bilateral knee pain. No h/o UTIs.  Hypothyroidism: - dx 2001 - on Synthroid DAW 175 mcg daily  - At 10 am - Eats 1 h later - With water - + calcium + D at night - no MVI, PPI  - reviewed TFTs: Lab Results  Component Value Date   TSH 3.57 04/23/2015   TSH 5.83* 03/10/2015   TSH 2.08 04/18/2013   TSH 2.12 08/14/2012   TSH 0.67 02/13/2012   FREET4 1.19 04/23/2015   FREET4 1.09 03/10/2015   FREET4 1.37 02/13/2012   I reviewed pt's medications, allergies, PMH, social hx, family hx, and changes were documented in the history of present illness. Otherwise, unchanged from my initial visit note.  ROS: Constitutional: + weight loss, no fatigue, no subjective hyperthermia/hypothermia Eyes: no blurry vision, no xerophthalmia ENT: no sore throat, no nodules palpated in throat, no dysphagia/odynophagia, no hoarseness Cardiovascular: no CP/+SOB /palpitations/+ leg swelling Respiratory: no cough/+SOB Gastrointestinal: no N/V/D/C, hair loss  Musculoskeletal: no muscle/ joint aches, + HA Skin: no rashes  PE: BP 110/64 mmHg  Pulse 94  Temp(Src) 97.9 F (36.6 C) (Oral)  Resp 12  Wt 374 lb (169.645 kg)  SpO2 92% Body mass index is 65.2 kg/(m^2).  Wt Readings from Last 3 Encounters:  12/15/15 374 lb (169.645 kg)  11/24/15 371 lb 3.2 oz (168.375 kg)  08/27/15 376 lb 9.6 oz (170.825 kg)   Constitutional: obesity grade 3, in NAD Eyes: PERRLA, EOMI, no exophthalmos ENT: moist mucous membranes, no thyromegaly, no cervical lymphadenopathy Cardiovascular: tachycardia, RR, No MRG Respiratory: CTA B Gastrointestinal: abdomen soft, NT, ND, BS+ Musculoskeletal: strength intact in all 4 Skin: moist, warm, no rashes Severe  lymphedema bilateral legs, but improved  ASSESSMENT: 1. DM2, insulin-dependent, uncontrolled, with complications - PN, on Neurontin  2. Hypothyroidism - On d.a.w. Synthroid    PLAN:  1. Patient with long-standing, with worse controlled diabetes after stopping Invokana. Since her sugars were in the 200s to 300s in a.m. (no sugar checks later in the day), we added long acting insulin. At that time, we discussed about different types of regimens, and she opted for NPH insulin which her husband takes. Her sugars have improved dramatically on NPH bid. She returns after a 7 mo-absence >> lost 11 lbs and sugars are ~same as before. - I advised her to:  Patient Instructions  Please continue: - Metformin 1000 mg 2x a day - Glipizide 10 mg 2x a day - NPH (Insulin N): - 25 units in am  - 20 units at bedtime  Please return in 3 months with your sugar log.   - advised her to continue checking her sugars at different times of the day - check 2 times a day, rotating checkso - UTD with eye exams - will check a new HbA1c today >> 6.1% (EXCELLENT improvement!) - Return to clinic in 3 months with sugar log    2. Hypothyroidism - Reviewed the latest thyroid labs: normal - discussed about taking the thyroid hormone every day, with water, >30 minutes before breakfast, separated by >4 hours from acid reflux medications, calcium, iron, multivitamins. She is now taking it correctly - continue 175 mcg of Synthroid daily  3. Obesity grade 3 - She lost 8 lbs since 05/2015. She is watching her diet more.  Orders Only on 12/15/2015  Component Date Value Ref Range Status  . Hemoglobin A1C 12/15/2015 6.1   Final  Office Visit on 12/15/2015  Component Date Value Ref Range Status  . Free T4 12/15/2015 1.20  0.60 - 1.60 ng/dL Final  . TSH 12/15/2015 4.27  0.35 - 4.50 uIU/mL Final   TSH still high >> will increase LT4 to 200 mcg daily. Will recheck TFTs at next visit.

## 2015-12-15 NOTE — Patient Instructions (Signed)
Please stop at the lab.  Please continue: - Metformin 1000 mg 2x a day - Glipizide 10 mg 2x a day - NPH (Insulin N): - 25 units in am  - 20 units at bedtime  Please continue Levothyroxine 175 mcg daily.  Take the thyroid hormone every day, with water, at least 30 minutes before breakfast, separated by at least 4 hours from: - acid reflux medications - calcium - iron - multivitamins  Please return in 3 months with your sugar log.

## 2015-12-16 MED ORDER — LEVOTHYROXINE SODIUM 200 MCG PO TABS: 200.0000 ug | ORAL_TABLET | Freq: Every day | ORAL | Status: DC

## 2015-12-17 ENCOUNTER — Telehealth: Payer: Self-pay | Admitting: *Deleted

## 2015-12-17 NOTE — Telephone Encounter (Signed)
Called pt and lvm advising her per Dr Arman Filter result note:  TSH still slightly high >> will increase LT4 to 200 mcg daily. Will recheck TFTs at next visit. I sent the new dose to her pharmacy.

## 2015-12-29 ENCOUNTER — Encounter: Payer: Medicare Other | Attending: Physical Medicine & Rehabilitation | Admitting: Registered Nurse

## 2015-12-29 ENCOUNTER — Encounter: Payer: Self-pay | Admitting: Registered Nurse

## 2015-12-29 VITALS — BP 148/87 | HR 86 | Wt 372.0 lb

## 2015-12-29 DIAGNOSIS — Z5181 Encounter for therapeutic drug level monitoring: Secondary | ICD-10-CM | POA: Diagnosis not present

## 2015-12-29 DIAGNOSIS — G8929 Other chronic pain: Secondary | ICD-10-CM | POA: Diagnosis not present

## 2015-12-29 DIAGNOSIS — M5137 Other intervertebral disc degeneration, lumbosacral region: Secondary | ICD-10-CM | POA: Diagnosis not present

## 2015-12-29 DIAGNOSIS — M25562 Pain in left knee: Secondary | ICD-10-CM | POA: Insufficient documentation

## 2015-12-29 DIAGNOSIS — Z79899 Other long term (current) drug therapy: Secondary | ICD-10-CM

## 2015-12-29 DIAGNOSIS — M25561 Pain in right knee: Secondary | ICD-10-CM | POA: Insufficient documentation

## 2015-12-29 DIAGNOSIS — M75102 Unspecified rotator cuff tear or rupture of left shoulder, not specified as traumatic: Secondary | ICD-10-CM | POA: Insufficient documentation

## 2015-12-29 DIAGNOSIS — M51379 Other intervertebral disc degeneration, lumbosacral region without mention of lumbar back pain or lower extremity pain: Secondary | ICD-10-CM

## 2015-12-29 DIAGNOSIS — M7541 Impingement syndrome of right shoulder: Secondary | ICD-10-CM | POA: Insufficient documentation

## 2015-12-29 DIAGNOSIS — G894 Chronic pain syndrome: Secondary | ICD-10-CM | POA: Diagnosis not present

## 2015-12-29 MED ORDER — MORPHINE SULFATE ER 30 MG PO TBCR: 30.0000 mg | EXTENDED_RELEASE_TABLET | Freq: Two times a day (BID) | ORAL | Status: DC

## 2015-12-29 MED ORDER — HYDROCODONE-ACETAMINOPHEN 10-325 MG PO TABS: 1.0000 | ORAL_TABLET | Freq: Three times a day (TID) | ORAL | Status: DC

## 2015-12-29 NOTE — Progress Notes (Signed)
Subjective:    Patient ID: Theresa Huffman, female    DOB: 1946-08-20, 70 y.o.   MRN: [id number]  HPI: [name] is a 70 year old female who returns for follow up for chronic pain and medication refill. She states her pain is locatedin her lower back and right knee. She rates her pain 8. Her current exercise regime is walking short distances using lofstrand crutches and performing chair exercises daily.    Pain Inventory Average Pain 8 Pain Right Now 8 My pain is burning and aching  In the last 24 hours, has pain interfered with the following? General activity 7 Relation with others 6 Enjoyment of life 8 What TIME of day is your pain at its worst? . Sleep (in general) NA  Pain is worse with: walking, bending and standing Pain improves with: rest and medication Relief from Meds: 5  Mobility walk with assistance how many minutes can you walk? 10 ability to climb steps?  no do you drive?  yes use a wheelchair Do you have any goals in this area?  yes  Function disabled: date disabled . I need assistance with the following:  meal prep, household duties and shopping Do you have any goals in this area?  yes  Neuro/Psych weakness numbness trouble walking depression  Prior Studies Any changes since last visit?  no  Physicians involved in your care Any changes since last visit?  no   Family History  Problem Relation Age of Onset  . Cancer Mother     lung and colon  . Stroke Sister    Social History   Social History  . Marital Status: Married    Spouse Name: N/A  . Number of Children: 1  . Years of Education: N/A   Occupational History  . Diability    Social History Main Topics  . Smoking status: Never Smoker   . Smokeless tobacco: Never Used  . Alcohol Use: No  . Drug Use: No  . Sexual Activity: Not Asked   Other Topics Concern  . None   Social History Narrative   Lives in [place], one adult child      Caffeine use: coffee  daily in am   Past Surgical History  Procedure Laterality Date  . Cholecystectomy    . Tubal ligation     Past Medical History  Diagnosis Date  . Hyperlipidemia   . Hypertension   . Low back pain   . Morbid obesity ([place])   . Diabetes mellitus     type II  . Thyroid disease     hypothyroidism  . GERD (gastroesophageal reflux disease)   . Lymphedema   . Lumbosacral spondylosis without myelopathy   . Pain in limb   . Lumbago   . Facet syndrome, lumbar   . Lymphedema   . Primary localized osteoarthrosis, lower leg   . Hypercholesteremia    There were no vitals taken for this visit.  Opioid Risk Score:   Fall Risk Score:  `1  Depression screen PHQ 2/9  Depression screen [facility] 2/9 11/24/2015 07/16/2015  Decreased Interest 0 0  Down, Depressed, Hopeless 1 1  PHQ - 2 Score 1 1  Altered sleeping - 1  Tired, decreased energy - 1  Change in appetite - 1  Feeling bad or failure about yourself  - 1  Trouble concentrating - 0  Moving slowly or fidgety/restless - 0  Suicidal thoughts - 0  PHQ-9 Score - 5  Difficult  doing work/chores - Somewhat difficult     Review of Systems  All other systems reviewed and are negative.      Objective:   Physical Exam  Constitutional: She is oriented to person, place, and time. She appears well-developed and well-nourished.  HENT:  Head: Normocephalic and atraumatic.  Neck: Normal range of motion. Neck supple.  Cardiovascular: Normal rate and regular rhythm.   Pulmonary/Chest: Effort normal and breath sounds normal.  Musculoskeletal:  Normal Muscle Bulk and Muscle Testing Reveals: Upper Extremities: Full ROM and Muscle Strength 5/5 Back without spinal or paraspinal tenderness Lower Extremities: Full ROM and Muscle Strength 5/5 Right Lower Extremity Flexion Produces pain into Patella Arises from chair slowly using lofstrand crutches for support Antalgic gait  Neurological: She is alert and oriented to person, place, and time.    Skin: Skin is warm and dry.  Psychiatric: She has a normal mood and affect.  Nursing note and vitals reviewed.         Assessment & Plan:  1. Lumbar pain chronic, no radiculopathy: Lumbar degenerative disc L4-5 and L5-S1.  Refilled: HYDRcodone 10/325mg  one tablet TID #90.  MS CONTIN 30 mg one every 12 hours #60.  2. Bilateral knee osteoarthritis: Continue with Exercise and Heat therapy. Continue current medication regime.  3. Morbid Obesity: Continue with Diabetic Diet Regime and Exercise Regimen.  20 minutes of face to face patient care time was spent during this visit. All questions were encouraged and answered.   F/U in 1 month

## 2016-01-04 ENCOUNTER — Other Ambulatory Visit: Payer: Self-pay | Admitting: Physical Medicine & Rehabilitation

## 2016-01-18 ENCOUNTER — Other Ambulatory Visit: Payer: Self-pay | Admitting: Internal Medicine

## 2016-01-26 ENCOUNTER — Encounter: Payer: Medicare Other | Attending: Physical Medicine & Rehabilitation | Admitting: Registered Nurse

## 2016-01-26 ENCOUNTER — Encounter: Payer: Self-pay | Admitting: Registered Nurse

## 2016-01-26 VITALS — BP 151/69 | HR 90

## 2016-01-26 DIAGNOSIS — G894 Chronic pain syndrome: Secondary | ICD-10-CM

## 2016-01-26 DIAGNOSIS — M75102 Unspecified rotator cuff tear or rupture of left shoulder, not specified as traumatic: Secondary | ICD-10-CM | POA: Insufficient documentation

## 2016-01-26 DIAGNOSIS — M25562 Pain in left knee: Secondary | ICD-10-CM

## 2016-01-26 DIAGNOSIS — G8929 Other chronic pain: Secondary | ICD-10-CM | POA: Diagnosis not present

## 2016-01-26 DIAGNOSIS — M5137 Other intervertebral disc degeneration, lumbosacral region: Secondary | ICD-10-CM

## 2016-01-26 DIAGNOSIS — M25561 Pain in right knee: Secondary | ICD-10-CM | POA: Diagnosis not present

## 2016-01-26 DIAGNOSIS — Z5181 Encounter for therapeutic drug level monitoring: Secondary | ICD-10-CM

## 2016-01-26 DIAGNOSIS — M7541 Impingement syndrome of right shoulder: Secondary | ICD-10-CM | POA: Diagnosis not present

## 2016-01-26 DIAGNOSIS — Z79899 Other long term (current) drug therapy: Secondary | ICD-10-CM | POA: Diagnosis not present

## 2016-01-26 DIAGNOSIS — M51379 Other intervertebral disc degeneration, lumbosacral region without mention of lumbar back pain or lower extremity pain: Secondary | ICD-10-CM

## 2016-01-26 MED ORDER — HYDROCODONE-ACETAMINOPHEN 10-325 MG PO TABS: 1.0000 | ORAL_TABLET | Freq: Three times a day (TID) | ORAL | Status: DC

## 2016-01-26 MED ORDER — MORPHINE SULFATE ER 30 MG PO TBCR: 30.0000 mg | EXTENDED_RELEASE_TABLET | Freq: Two times a day (BID) | ORAL | Status: DC

## 2016-01-26 NOTE — Progress Notes (Signed)
Subjective:    Patient ID: Theresa Huffman, female    DOB: February 15, 1946, 70 y.o.   MRN: [id number]  HPI: [name] is a 70 year old female who returns for follow up for chronic pain and medication refill. She states her pain is locatedin her lower back and right knee. She rates her pain 8. Her current exercise regime is walking short distances using lofstrand crutches and performing chair exercises occassionally.   Pain Inventory Average Pain 8 Pain Right Now 8 My pain is burning and aching  In the last 24 hours, has pain interfered with the following? General activity 8 Relation with others 6 Enjoyment of life 6 What TIME of day is your pain at its worst? . Sleep (in general) NA  Pain is worse with: . Pain improves with: . Relief from Meds: .  Mobility walk with assistance how many minutes can you walk? 10 ability to climb steps?  no do you drive?  yes use a wheelchair Do you have any goals in this area?  yes  Function disabled: date disabled . I need assistance with the following:  meal prep, household duties and shopping Do you have any goals in this area?  yes  Neuro/Psych weakness numbness trouble walking depression  Prior Studies Any changes since last visit?  no  Physicians involved in your care Any changes since last visit?  no   Family History  Problem Relation Age of Onset  . Cancer Mother     lung and colon  . Stroke Sister    Social History   Social History  . Marital Status: Married    Spouse Name: N/A  . Number of Children: 1  . Years of Education: N/A   Occupational History  . Diability    Social History Main Topics  . Smoking status: Never Smoker   . Smokeless tobacco: Never Used  . Alcohol Use: No  . Drug Use: No  . Sexual Activity: Not Asked   Other Topics Concern  . None   Social History Narrative   Lives in [place], one adult child      Caffeine use: coffee daily in am   Past Surgical History    Procedure Laterality Date  . Cholecystectomy    . Tubal ligation     Past Medical History  Diagnosis Date  . Hyperlipidemia   . Hypertension   . Low back pain   . Morbid obesity ([place])   . Diabetes mellitus     type II  . Thyroid disease     hypothyroidism  . GERD (gastroesophageal reflux disease)   . Lymphedema   . Lumbosacral spondylosis without myelopathy   . Pain in limb   . Lumbago   . Facet syndrome, lumbar   . Lymphedema   . Primary localized osteoarthrosis, lower leg   . Hypercholesteremia    BP 151/77 mmHg  Pulse 97  SpO2 87%  Opioid Risk Score:   Fall Risk Score:  `1  Depression screen PHQ 2/9  Depression screen [facility] 2/9 01/26/2016 11/24/2015 07/16/2015  Decreased Interest 2 0 0  Down, Depressed, Hopeless 1 1 1   PHQ - 2 Score 3 1 1   Altered sleeping 3 - 1  Tired, decreased energy 3 - 1  Change in appetite 2 - 1  Feeling bad or failure about yourself  2 - 1  Trouble concentrating 0 - 0  Moving slowly or fidgety/restless 0 - 0  Suicidal thoughts 0 - 0  PHQ-9 Score 13 - 5  Difficult doing work/chores Somewhat difficult - Somewhat difficult     Review of Systems     Objective:   Physical Exam  Constitutional: She is oriented to person, place, and time. She appears well-developed and well-nourished.  HENT:  Head: Normocephalic and atraumatic.  Neck: Normal range of motion. Neck supple.  Cardiovascular: Normal rate and regular rhythm.   Pulmonary/Chest: Effort normal and breath sounds normal.  Musculoskeletal:  Normal Muscle Bulk and Muscle Testing Reveals: Upper Extremities: Full ROM and Muscle Strength 5/5 Back without spinal tenderness Lower Extremities: Full ROM and Muscle Strength 5/5 Right Lower Extremity Flexion Produces Pain into Patella Arises from chair slowly using lofstrand crutches for support Antalgic gait   Neurological: She is alert and oriented to person, place, and time.  Skin: Skin is warm and dry.  Psychiatric: She has a  normal mood and affect.  Nursing note and vitals reviewed.         Assessment & Plan:  1. Lumbar pain chronic, no radiculopathy: Lumbar degenerative disc L4-5 and L5-S1.  Refilled: HYDRcodone 10/325mg  one tablet TID #90.  MS CONTIN 30 mg one every 12 hours #60.  2. Bilateral knee osteoarthritis: Continue with Exercise and Heat therapy. Continue current medication regime.  3. Morbid Obesity: Continue with Diabetic Diet Regime and Exercise Regimen.  20 minutes of face to face patient care time was spent during this visit. All questions were encouraged and answered.   F/U in 1 month

## 2016-01-30 LAB — 6-ACETYLMORPHINE,TOXASSURE ADD
6-ACETYLMORPHINE: NEGATIVE
6-acetylmorphine: NOT DETECTED ng/mg creat

## 2016-01-30 LAB — TOXASSURE SELECT,+ANTIDEPR,UR: PDF: 0

## 2016-02-02 NOTE — Progress Notes (Signed)
Urine drug screen for this encounter is consistent for prescribed medication 

## 2016-02-08 ENCOUNTER — Other Ambulatory Visit: Payer: Self-pay | Admitting: Internal Medicine

## 2016-02-08 ENCOUNTER — Other Ambulatory Visit: Payer: Self-pay | Admitting: Physical Medicine & Rehabilitation

## 2016-02-26 ENCOUNTER — Encounter: Payer: Self-pay | Admitting: Registered Nurse

## 2016-02-26 ENCOUNTER — Encounter: Payer: Medicare Other | Attending: Physical Medicine & Rehabilitation | Admitting: Registered Nurse

## 2016-02-26 VITALS — BP 153/54 | HR 92 | Resp 14

## 2016-02-26 DIAGNOSIS — M5137 Other intervertebral disc degeneration, lumbosacral region: Secondary | ICD-10-CM

## 2016-02-26 DIAGNOSIS — Z5181 Encounter for therapeutic drug level monitoring: Secondary | ICD-10-CM | POA: Diagnosis not present

## 2016-02-26 DIAGNOSIS — M75102 Unspecified rotator cuff tear or rupture of left shoulder, not specified as traumatic: Secondary | ICD-10-CM | POA: Insufficient documentation

## 2016-02-26 DIAGNOSIS — G8929 Other chronic pain: Secondary | ICD-10-CM | POA: Diagnosis not present

## 2016-02-26 DIAGNOSIS — M25561 Pain in right knee: Secondary | ICD-10-CM | POA: Diagnosis not present

## 2016-02-26 DIAGNOSIS — M7541 Impingement syndrome of right shoulder: Secondary | ICD-10-CM | POA: Insufficient documentation

## 2016-02-26 DIAGNOSIS — M25562 Pain in left knee: Secondary | ICD-10-CM | POA: Diagnosis not present

## 2016-02-26 DIAGNOSIS — G894 Chronic pain syndrome: Secondary | ICD-10-CM | POA: Diagnosis not present

## 2016-02-26 DIAGNOSIS — Z79899 Other long term (current) drug therapy: Secondary | ICD-10-CM

## 2016-02-26 MED ORDER — HYDROCODONE-ACETAMINOPHEN 10-325 MG PO TABS: 1.0000 | ORAL_TABLET | Freq: Three times a day (TID) | ORAL | Status: DC

## 2016-02-26 MED ORDER — MORPHINE SULFATE ER 30 MG PO TBCR
30.0000 mg | EXTENDED_RELEASE_TABLET | Freq: Two times a day (BID) | ORAL | Status: DC
Start: 2016-02-26 — End: 2016-03-23

## 2016-02-26 NOTE — Progress Notes (Signed)
Subjective:    Patient ID: Theresa Huffman, female    DOB: 03/23/1946, 69 y.o.   MRN: [id number]  HPI: [name] is a 70 year old female who returns for follow up for chronic pain and medication refill. She states her pain is located in her neck, bilateral shoulder's, lower back and right knee. She rates her pain 7. Her current exercise regime is walking short distances using lofstrand crutches and performing chair exercises occassionally.   Pain Inventory Average Pain 7 Pain Right Now 7 My pain is burning and aching  In the last 24 hours, has pain interfered with the following? General activity 8 Relation with others 7 Enjoyment of life 7 What TIME of day is your pain at its worst? morning, evening  Sleep (in general) Poor  Pain is worse with: walking, bending and standing Pain improves with: rest and medication Relief from Meds: 7  Mobility walk with assistance ability to climb steps?  no do you drive?  yes use a wheelchair transfers alone Do you have any goals in this area?  yes  Function disabled: date disabled . I need assistance with the following:  meal prep, household duties and shopping Do you have any goals in this area?  yes  Neuro/Psych weakness numbness trouble walking depression  Prior Studies Any changes since last visit?  no  Physicians involved in your care Any changes since last visit?  no   Family History  Problem Relation Age of Onset  . Cancer Mother     lung and colon  . Stroke Sister    Social History   Social History  . Marital Status: Married    Spouse Name: N/A  . Number of Children: 1  . Years of Education: N/A   Occupational History  . Diability    Social History Main Topics  . Smoking status: Never Smoker   . Smokeless tobacco: Never Used  . Alcohol Use: No  . Drug Use: No  . Sexual Activity: Not Asked   Other Topics Concern  . None   Social History Narrative   Lives in [place], one  adult child      Caffeine use: coffee daily in am   Past Surgical History  Procedure Laterality Date  . Cholecystectomy    . Tubal ligation     Past Medical History  Diagnosis Date  . Hyperlipidemia   . Hypertension   . Low back pain   . Morbid obesity ([place])   . Diabetes mellitus     type II  . Thyroid disease     hypothyroidism  . GERD (gastroesophageal reflux disease)   . Lymphedema   . Lumbosacral spondylosis without myelopathy   . Pain in limb   . Lumbago   . Facet syndrome, lumbar   . Lymphedema   . Primary localized osteoarthrosis, lower leg   . Hypercholesteremia    BP 153/54 mmHg  Pulse 92  Resp 14  SpO2 84%  Opioid Risk Score:   Fall Risk Score:  `1  Depression screen PHQ 2/9  Depression screen [facility] 2/9 01/26/2016 11/24/2015 07/16/2015  Decreased Interest 2 0 0  Down, Depressed, Hopeless 1 1 1   PHQ - 2 Score 3 1 1   Altered sleeping 3 - 1  Tired, decreased energy 3 - 1  Change in appetite 2 - 1  Feeling bad or failure about yourself  2 - 1  Trouble concentrating 0 - 0  Moving slowly or fidgety/restless 0 -  0  Suicidal thoughts 0 - 0  PHQ-9 Score 13 - 5  Difficult doing work/chores Somewhat difficult - Somewhat difficult   ' Review of Systems  All other systems reviewed and are negative.      Objective:   Physical Exam  Constitutional: She is oriented to person, place, and time. She appears well-developed and well-nourished.  HENT:  Head: Normocephalic and atraumatic.  Neck: Normal range of motion. Neck supple.  Cardiovascular: Normal rate and regular rhythm.   Pulmonary/Chest: Effort normal and breath sounds normal.  Musculoskeletal:  Normal Muscle Bulk and Muscle Testing Reveals: Upper Extremities: Full ROM and Muscle Strength 5/5 Lower Extremities: Full ROM and Muscle Strength 5/5 Right Lower Extremity Flexion Produces Pain into Patella Arises from chair slowly using Lofstrand crutches Narrow Based gait   Neurological: She is alert and  oriented to person, place, and time.  Skin: Skin is warm and dry.  Psychiatric: She has a normal mood and affect.  Nursing note and vitals reviewed.         Assessment & Plan:  1. Lumbar pain chronic, no radiculopathy: Lumbar degenerative disc L4-5 and L5-S1.  Refilled: HYDRcodone 10/325mg  one tablet TID #90.  MS CONTIN 30 mg one every 12 hours #60.  We will continue the opioid monitoring program,this consists of regular clinic visits, examinations, urine drug screen, pill counts as well as use of [place] Controlled Substance reporting System. Continue Gabapentin. 2. Bilateral knee osteoarthritis: Continue with Exercise and Heat therapy. Continue current medication regime.  3. Morbid Obesity: Continue with Diabetic Diet Regime and Exercise Regimen. 4. Muscle Spasms: Continue Baclofen  20 minutes of face to face patient care time was spent during this visit. All questions were encouraged and answered.   F/U in 1 month

## 2016-03-14 ENCOUNTER — Ambulatory Visit: Payer: Medicare Other | Admitting: Internal Medicine

## 2016-03-23 ENCOUNTER — Encounter: Payer: Medicare Other | Attending: Physical Medicine & Rehabilitation | Admitting: Registered Nurse

## 2016-03-23 ENCOUNTER — Encounter: Payer: Self-pay | Admitting: Registered Nurse

## 2016-03-23 VITALS — BP 176/88 | HR 108 | Resp 14

## 2016-03-23 DIAGNOSIS — M25562 Pain in left knee: Secondary | ICD-10-CM | POA: Diagnosis not present

## 2016-03-23 DIAGNOSIS — M7541 Impingement syndrome of right shoulder: Secondary | ICD-10-CM | POA: Diagnosis not present

## 2016-03-23 DIAGNOSIS — Z5181 Encounter for therapeutic drug level monitoring: Secondary | ICD-10-CM | POA: Insufficient documentation

## 2016-03-23 DIAGNOSIS — Z79899 Other long term (current) drug therapy: Secondary | ICD-10-CM | POA: Insufficient documentation

## 2016-03-23 DIAGNOSIS — M75102 Unspecified rotator cuff tear or rupture of left shoulder, not specified as traumatic: Secondary | ICD-10-CM | POA: Insufficient documentation

## 2016-03-23 DIAGNOSIS — M25561 Pain in right knee: Secondary | ICD-10-CM | POA: Insufficient documentation

## 2016-03-23 DIAGNOSIS — G8929 Other chronic pain: Secondary | ICD-10-CM | POA: Insufficient documentation

## 2016-03-23 DIAGNOSIS — M5137 Other intervertebral disc degeneration, lumbosacral region: Secondary | ICD-10-CM

## 2016-03-23 DIAGNOSIS — G894 Chronic pain syndrome: Secondary | ICD-10-CM

## 2016-03-23 MED ORDER — HYDROCODONE-ACETAMINOPHEN 10-325 MG PO TABS: 1.0000 | ORAL_TABLET | Freq: Three times a day (TID) | ORAL | Status: DC

## 2016-03-23 MED ORDER — MORPHINE SULFATE ER 30 MG PO TBCR: 30.0000 mg | EXTENDED_RELEASE_TABLET | Freq: Two times a day (BID) | ORAL | Status: DC

## 2016-03-23 NOTE — Progress Notes (Signed)
Subjective:    Patient ID: Theresa Huffman, female    DOB: [birth year], 70 y.o.   MRN: [id number]  HPI: [name] is a 70 year old female who returns for follow up for chronic pain and medication refill. She states her pain is located in her neck, right shoulder, lower back and right knee. She rates her pain 8. Her current exercise regime is walking short distances using lofstrand crutches and performing chair exercises occassionally.   Pain Inventory Average Pain 8 Pain Right Now 8 My pain is burning and aching  In the last 24 hours, has pain interfered with the following? General activity 8 Relation with others 6 Enjoyment of life 8 What TIME of day is your pain at its worst? morning, evening  Sleep (in general) Fair  Pain is worse with: walking, bending and standing Pain improves with: rest and medication Relief from Meds: 6  Mobility walk with assistance how many minutes can you walk? 10 ability to climb steps?  no do you drive?  yes Do you have any goals in this area?  yes  Function disabled: date disabled . I need assistance with the following:  meal prep, household duties and shopping Do you have any goals in this area?  yes  Neuro/Psych weakness numbness trouble walking depression  Prior Studies Any changes since last visit?  no  Physicians involved in your care Any changes since last visit?  no   Family History  Problem Relation Age of Onset  . Cancer Mother     lung and colon  . Stroke Sister    Social History   Social History  . Marital Status: Married    Spouse Name: N/A  . Number of Children: 1  . Years of Education: N/A   Occupational History  . Diability    Social History Main Topics  . Smoking status: Never Smoker   . Smokeless tobacco: Never Used  . Alcohol Use: No  . Drug Use: No  . Sexual Activity: Not Asked   Other Topics Concern  . None   Social History Narrative   Lives in [place], one adult  child      Caffeine use: coffee daily in am   Past Surgical History  Procedure Laterality Date  . Cholecystectomy    . Tubal ligation     Past Medical History  Diagnosis Date  . Hyperlipidemia   . Hypertension   . Low back pain   . Morbid obesity ([place])   . Diabetes mellitus     type II  . Thyroid disease     hypothyroidism  . GERD (gastroesophageal reflux disease)   . Lymphedema   . Lumbosacral spondylosis without myelopathy   . Pain in limb   . Lumbago   . Facet syndrome, lumbar   . Lymphedema   . Primary localized osteoarthrosis, lower leg   . Hypercholesteremia    BP 176/88 mmHg  Pulse 108  Resp 14  SpO2 93%  Opioid Risk Score:   Fall Risk Score:  `1  Depression screen PHQ 2/9  Depression screen [facility] 2/9 01/26/2016 11/24/2015 07/16/2015  Decreased Interest 2 0 0  Down, Depressed, Hopeless 1 1 1   PHQ - 2 Score 3 1 1   Altered sleeping 3 - 1  Tired, decreased energy 3 - 1  Change in appetite 2 - 1  Feeling bad or failure about yourself  2 - 1  Trouble concentrating 0 - 0  Moving slowly or  fidgety/restless 0 - 0  Suicidal thoughts 0 - 0  PHQ-9 Score 13 - 5  Difficult doing work/chores Somewhat difficult - Somewhat difficult     Review of Systems  All other systems reviewed and are negative.      Objective:   Physical Exam  Constitutional: She is oriented to person, place, and time. She appears well-developed and well-nourished.  HENT:  Head: Normocephalic and atraumatic.  Neck: Normal range of motion. Neck supple.  No Cervical Tenderness Noted  Cardiovascular: Normal rate and regular rhythm.   Pulmonary/Chest: Effort normal and breath sounds normal.  Musculoskeletal:  Normal Muscle Bulk and Muscle testing Reveals: Upper Extremities: Full ROM and Muscle Strength 5/5 Thoracic Paraspinal Tenderness: T-1- T-3 Lumbar paraspinal Tenderness: L-4- L-5 Lower Extremities: Full ROM and Muscle Strength 5/5 Arises from chair slowly using Lofstrand  Crutches Antalgic Gait  Neurological: She is alert and oriented to person, place, and time.  Skin: Skin is warm and dry.  Psychiatric: She has a normal mood and affect.  Nursing note and vitals reviewed.         Assessment & Plan:  1. Lumbar pain chronic, no radiculopathy: Lumbar degenerative disc L4-5 and L5-S1.  Refilled: HYDRcodone 10/325mg  one tablet TID #90.  MS CONTIN 30 mg one every 12 hours #60.  We will continue the opioid monitoring program,this consists of regular clinic visits, examinations, urine drug screen, pill counts as well as use of [place] Controlled Substance reporting System. Continue Gabapentin. 2. Bilateral knee osteoarthritis: Continue with Exercise and Heat therapy. Continue current medication regime.  3. Morbid Obesity: Continue with Diabetic Diet Regime and Exercise Regimen. 4. Muscle Spasms: Continue Baclofen  20 minutes of face to face patient care time was spent during this visit. All questions were encouraged and answered.   F/U in 1 month

## 2016-04-12 ENCOUNTER — Telehealth: Payer: Self-pay | Admitting: Family Medicine

## 2016-04-12 NOTE — Telephone Encounter (Signed)
I spoke with Theresa Huffman and she has to watch her mother in law today and cannot go to UC or ED; Theresa Huffman said now her Pulse ox is 86; for the past 2 months pulse ox usually 88. Theresa Huffman is not on O2. Theresa Huffman does have SOB upon exertion but that has been going on for months. Theresa Huffman  Scheduled 30 min appt on 04/13/16 at 8:30 with Dr Deborra Medina; advised Theresa Huffman if condition changes or worsens prior to appt to go to ED. Theresa Huffman voiced understanding.

## 2016-04-12 NOTE — Telephone Encounter (Signed)
Patient Name: CHRYS VANDERHOFF DOB: Apr 14, 1946 Initial Comment Caller states her oxygent meter for finger is registering about 85; wants appt; getting shakey at times; also headaches; was 88 today; Nurse Assessment Nurse: Ronnald Ramp, RN, Miranda Date/Time (Eastern Time): 04/12/2016 2:25:37 PM Confirm and document reason for call. If symptomatic, describe symptoms. You must click the next button to save text entered. ---Caller states she has SOB when she walks for the last 6-8 months. When she goes to pain management her CPOX is low. She has a meter at home and it was 88. Has the patient traveled out of the country within the last 30 days? ---Not Applicable Does the patient have any new or worsening symptoms? ---Yes Will a triage be completed? ---Yes Related visit to physician within the last 2 weeks? ---No Does the PT have any chronic conditions? (i.e. diabetes, asthma, etc.) ---Yes List chronic conditions. ---Thyroid, Diabetes, High Cholesterol, Chronic back pain. Is this a behavioral health or substance abuse call? ---No Guidelines Guideline Title Affirmed Question Affirmed Notes Breathing Difficulty [1] MILD difficulty breathing (e.g., minimal/no SOB at rest, SOB with walking, pulse <100) AND [2] NEW-onset or WORSE than normal Final Disposition User See Physician within 4 Hours (or PCP triage) Ronnald Ramp, RN, Miranda Comments Also, for the last 2 weeks she has felt her body shaking and her head bobbing. When it happens she has headache and last for about 30 min. No appt available with PCP or at primary care office within recommended time frame. Offered appt at [facility] and caller declined. She prefers to make an appt for tomorrow at her office. Told caller that I would put a note on the chart asking the MD to review and call her back for further recommendations. Refused UC or ED. Referrals GO TO FACILITY REFUSED Disagree/Comply: Disagree Disagree/Comply Reason: Disagree with  instructions

## 2016-04-13 ENCOUNTER — Encounter: Payer: Self-pay | Admitting: *Deleted

## 2016-04-13 ENCOUNTER — Ambulatory Visit (INDEPENDENT_AMBULATORY_CARE_PROVIDER_SITE_OTHER): Payer: Medicare Other | Admitting: Family Medicine

## 2016-04-13 ENCOUNTER — Encounter: Payer: Self-pay | Admitting: Family Medicine

## 2016-04-13 VITALS — BP 162/82 | HR 125 | Temp 97.9°F | Wt 382.5 lb

## 2016-04-13 DIAGNOSIS — E1165 Type 2 diabetes mellitus with hyperglycemia: Secondary | ICD-10-CM

## 2016-04-13 DIAGNOSIS — R0602 Shortness of breath: Secondary | ICD-10-CM | POA: Insufficient documentation

## 2016-04-13 DIAGNOSIS — E1140 Type 2 diabetes mellitus with diabetic neuropathy, unspecified: Secondary | ICD-10-CM | POA: Diagnosis not present

## 2016-04-13 DIAGNOSIS — R0609 Other forms of dyspnea: Secondary | ICD-10-CM

## 2016-04-13 DIAGNOSIS — IMO0002 Reserved for concepts with insufficient information to code with codable children: Secondary | ICD-10-CM

## 2016-04-13 LAB — COMPREHENSIVE METABOLIC PANEL
ALBUMIN: 4.1 g/dL (ref 3.5–5.2)
ALK PHOS: 97 U/L (ref 39–117)
ALT: 18 U/L (ref 0–35)
AST: 23 U/L (ref 0–37)
BUN: 21 mg/dL (ref 6–23)
CALCIUM: 9.8 mg/dL (ref 8.4–10.5)
CO2: 29 mEq/L (ref 19–32)
Chloride: 100 mEq/L (ref 96–112)
Creatinine, Ser: 0.92 mg/dL (ref 0.40–1.20)
GFR: 64.61 mL/min (ref >60.00)
Glucose, Bld: 172 mg/dL — ABNORMAL HIGH (ref 70–99)
POTASSIUM: 4.3 meq/L (ref 3.5–5.1)
Sodium: 140 mEq/L (ref 135–145)
TOTAL PROTEIN: 7.8 g/dL (ref 6.0–8.3)
Total Bilirubin: 0.4 mg/dL (ref 0.2–1.2)

## 2016-04-13 LAB — CBC WITH DIFFERENTIAL/PLATELET
Basophils Absolute: 0 10*3/uL (ref 0.0–0.1)
Basophils Relative: 0.5 % (ref 0.0–3.0)
EOS PCT: 3 % (ref 0.0–5.0)
Eosinophils Absolute: 0.3 10*3/uL (ref 0.0–0.7)
HCT: 40.2 % (ref 36.0–46.0)
HEMOGLOBIN: 13 g/dL (ref 12.0–15.0)
LYMPHS ABS: 2.4 10*3/uL (ref 0.7–4.0)
Lymphocytes Relative: 27.6 % (ref 12.0–46.0)
MCHC: 32.3 g/dL (ref 30.0–36.0)
MCV: 85.3 fl (ref 78.0–100.0)
MONOS PCT: 4.3 % (ref 3.0–12.0)
Monocytes Absolute: 0.4 10*3/uL (ref 0.1–1.0)
NEUTROS PCT: 64.6 % (ref 43.0–77.0)
Neutro Abs: 5.6 10*3/uL (ref 1.4–7.7)
Platelets: 308 10*3/uL (ref 150.0–400.0)
RBC: 4.71 Mil/uL (ref 3.87–5.11)
RDW: 16 % — ABNORMAL HIGH (ref 11.5–15.5)
WBC: 8.7 10*3/uL (ref 4.0–10.5)

## 2016-04-13 LAB — TSH: TSH: 2.32 u[IU]/mL (ref 0.35–4.50)

## 2016-04-13 MED ORDER — VENLAFAXINE HCL 75 MG PO TABS: ORAL_TABLET | ORAL | Status: DC

## 2016-04-13 NOTE — Progress Notes (Signed)
Pre visit review using our clinic review tool, if applicable. No additional management support is needed unless otherwise documented below in the visit note. 

## 2016-04-13 NOTE — Patient Instructions (Signed)
Good to see you. Please stop by to see Theresa Huffman on your way out. 

## 2016-04-13 NOTE — Assessment & Plan Note (Signed)
Progressive and likely multifactorial- her obesity being a large factor. Given progression of symptoms, refer to cardiology. Attempted to perform EKG today but she was unable to lay on our table. Labs as well. The patient indicates understanding of these issues and agrees with the plan. Orders Placed This Encounter  Procedures  . CBC with Differential/Platelet  . Comprehensive metabolic panel  . TSH  . Ambulatory referral to Cardiology  . EKG 12-Lead

## 2016-04-13 NOTE — Progress Notes (Signed)
Subjective:   Patient ID: Theresa Huffman, female    DOB: 1946-02-14, 70 y.o.   MRN: [id number]  Theresa Huffman is a pleasant 70 y.o. year old morbidly obese female with a h/o poorly controlled, DM, hypothyroidism who presents to clinic today with Shortness of Breath on 04/13/2016  HPI:  DOE- progressing for past 6-8  months.  No CP.  O2 sats running low at pain management- often in the mid 80s.  Lab Results  Component Value Date   WBC 8.6 08/14/2012   HGB 12.9 08/14/2012   HCT 39.6 08/14/2012   MCV 87.8 08/14/2012   PLT 280.0 08/14/2012     DM- improved control now that she is being compliant with endo recommendations ( seeing Dr. Cruzita Lederer).  Taking glucotrol 10 mg twice daily and Metformin  1000 mg twice daily.  Lab Results  Component Value Date   HGBA1C 6.1 12/15/2015   Hypothyroidism- takes synthroid 175 mg daily. Due for labs. Lab Results  Component Value Date   TSH 4.27 12/15/2015   Denies any other symptoms of hypo or hyperthyroidism.  Current Outpatient Prescriptions on File Prior to Visit  Medication Sig Dispense Refill  . albuterol (PROAIR HFA) 108 (90 BASE) MCG/ACT inhaler Inhale 2 puffs into the lungs every 4 (four) hours as needed. 1 Inhaler 1  . amLODipine (NORVASC) 5 MG tablet TAKE 1 TABLET BY MOUTH DAILY 90 tablet 2  . aspirin EC 81 MG tablet Take 162 mg by mouth daily.    . baclofen (LIORESAL) 10 MG tablet TAKE 1 TABLET BY MOUTH THREE TIMES A DAY 90 each 2  . Calcium Carbonate-Vitamin D 600-400 MG-UNIT per tablet Take 1 tablet by mouth daily. 1200mg  once daily    . furosemide (LASIX) 40 MG tablet TAKE 1 TABLET BY MOUTH TWICE A DAY 180 tablet 0  . gabapentin (NEURONTIN) 300 MG capsule TAKE TWO CAPSULES BY MOUTH THREE TIMES DAILY 180 capsule 2  . glipiZIDE (GLUCOTROL) 10 MG tablet Take 1 tablet (10 mg total) by mouth 2 (two) times daily before a meal. 60 tablet 2  . HYDROcodone-acetaminophen (NORCO) 10-325 MG tablet Take 1 tablet by mouth 3 (three)  times daily. As needed for back or leg pain 90 tablet 0  . ibuprofen (ADVIL,MOTRIN) 800 MG tablet TAKE 1 TABLET BY MOUTH 3 TIMES DAILY 90 tablet 3  . Insulin NPH, Human,, Isophane, (HUMULIN N) 100 UNIT/ML Kiwkpen Inject units 20 units before breakfast and 15 units before bedtime (Patient taking differently: Inject units 20 units before breakfast and 25 units before bedtime) 15 mL 2  . levothyroxine (SYNTHROID) 200 MCG tablet Take 1 tablet (200 mcg total) by mouth daily before breakfast. 90 tablet 1  . metFORMIN (GLUCOPHAGE) 1000 MG tablet TAKE 1 TABLET BY MOUTH TWICE A DAY 60 tablet 2  . morphine (MS CONTIN) 30 MG 12 hr tablet Take 1 tablet (30 mg total) by mouth every 12 (twelve) hours. 60 tablet 0  . potassium chloride SA (K-DUR,KLOR-CON) 20 MEQ tablet TAKE 1 TABLET BY MOUTH DAILY 90 tablet 2  . simvastatin (ZOCOR) 20 MG tablet TAKE 1 TABLET BY MOUTH DAILY 30 tablet 5  . venlafaxine (EFFEXOR) 75 MG tablet Take three tablets by mouth every night at bedtime 90 tablet 3   No current facility-administered medications on file prior to visit.    Allergies  Allergen Reactions  . Ace Inhibitors     Angioedema   . Erythromycin     REACTION: Nausea and vomiting  .  Invokana [Canagliflozin] Other (See Comments)    Yeast infection    Past Medical History  Diagnosis Date  . Hyperlipidemia   . Hypertension   . Low back pain   . Morbid obesity ([place])   . Diabetes mellitus     type II  . Thyroid disease     hypothyroidism  . GERD (gastroesophageal reflux disease)   . Lymphedema   . Lumbosacral spondylosis without myelopathy   . Pain in limb   . Lumbago   . Facet syndrome, lumbar   . Lymphedema   . Primary localized osteoarthrosis, lower leg   . Hypercholesteremia     Past Surgical History  Procedure Laterality Date  . Cholecystectomy    . Tubal ligation      Family History  Problem Relation Age of Onset  . Cancer Mother     lung and colon  . Stroke Sister     Social History     Social History  . Marital Status: Married    Spouse Name: N/A  . Number of Children: 1  . Years of Education: N/A   Occupational History  . Diability    Social History Main Topics  . Smoking status: Never Smoker   . Smokeless tobacco: Never Used  . Alcohol Use: No  . Drug Use: No  . Sexual Activity: Not on file   Other Topics Concern  . Not on file   Social History Narrative   Lives in [place], one adult child      Caffeine use: coffee daily in am   The PMH, PSH, Social History, Family History, Medications, and allergies have been reviewed in [facility], and have been updated if relevant.    Review of Systems  Constitutional: Negative for chills.  HENT: Negative for congestion, postnasal drip and rhinorrhea.   Respiratory: Positive for shortness of breath. Negative for wheezing.   Cardiovascular: Negative.   Gastrointestinal: Negative for nausea, vomiting, abdominal pain, diarrhea, constipation, blood in stool, anal bleeding and rectal pain.  Endocrine: Negative.   Genitourinary: Negative.   Musculoskeletal: Negative.   Allergic/Immunologic: Negative.   Neurological: Negative.   Hematological: Negative.   All other systems reviewed and are negative.      Objective:    BP 162/82 mmHg  Pulse 125  Temp(Src) 97.9 F (36.6 C) (Oral)  Wt 382 lb 8 oz (173.501 kg)  SpO2 88%   Physical Exam  Constitutional: She is oriented to person, place, and time. She appears well-developed and well-nourished. No distress.  Morbidly obese  HENT:  Head: Normocephalic and atraumatic.  Eyes: Conjunctivae are normal.  Neck: Normal range of motion.  Cardiovascular:  Tachycardia, resolved after she has been sitting in room for a few minutes  Pulmonary/Chest: Effort normal. No respiratory distress. She has no wheezes. She has no rales. She exhibits no tenderness.  Abdominal: She exhibits no distension.  Difficult to assess due to body habitus  Musculoskeletal: Normal range of  motion.  Neurological: She is alert and oriented to person, place, and time. No cranial nerve deficit.  Skin: Skin is warm and dry.  Psychiatric: She has a normal mood and affect. Her behavior is normal. Judgment and thought content normal.  Nursing note and vitals reviewed.         Assessment & Plan:   DOE (dyspnea on exertion) - Plan: CBC with Differential/Platelet, Comprehensive metabolic panel, TSH, EKG [id number]  Morbid obesity due to excess calories ([place])  Uncontrolled type 2 diabetes mellitus with diabetic neuropathy,  without long-term current use of insulin ([place]) No Follow-up on file.

## 2016-04-18 ENCOUNTER — Other Ambulatory Visit: Payer: Self-pay | Admitting: Internal Medicine

## 2016-04-20 ENCOUNTER — Encounter: Payer: Medicare Other | Attending: Physical Medicine & Rehabilitation | Admitting: Registered Nurse

## 2016-04-20 ENCOUNTER — Encounter: Payer: Self-pay | Admitting: Registered Nurse

## 2016-04-20 VITALS — BP 128/75 | HR 92 | Resp 16

## 2016-04-20 DIAGNOSIS — Z5181 Encounter for therapeutic drug level monitoring: Secondary | ICD-10-CM | POA: Insufficient documentation

## 2016-04-20 DIAGNOSIS — M75102 Unspecified rotator cuff tear or rupture of left shoulder, not specified as traumatic: Secondary | ICD-10-CM | POA: Diagnosis not present

## 2016-04-20 DIAGNOSIS — M5137 Other intervertebral disc degeneration, lumbosacral region: Secondary | ICD-10-CM

## 2016-04-20 DIAGNOSIS — M7541 Impingement syndrome of right shoulder: Secondary | ICD-10-CM | POA: Diagnosis not present

## 2016-04-20 DIAGNOSIS — M47817 Spondylosis without myelopathy or radiculopathy, lumbosacral region: Secondary | ICD-10-CM

## 2016-04-20 DIAGNOSIS — M25561 Pain in right knee: Secondary | ICD-10-CM

## 2016-04-20 DIAGNOSIS — Z79899 Other long term (current) drug therapy: Secondary | ICD-10-CM | POA: Diagnosis not present

## 2016-04-20 DIAGNOSIS — M25511 Pain in right shoulder: Secondary | ICD-10-CM

## 2016-04-20 DIAGNOSIS — M25562 Pain in left knee: Secondary | ICD-10-CM | POA: Diagnosis not present

## 2016-04-20 DIAGNOSIS — M25512 Pain in left shoulder: Secondary | ICD-10-CM

## 2016-04-20 DIAGNOSIS — G8929 Other chronic pain: Secondary | ICD-10-CM | POA: Insufficient documentation

## 2016-04-20 MED ORDER — MORPHINE SULFATE ER 30 MG PO TBCR: 30.0000 mg | EXTENDED_RELEASE_TABLET | Freq: Two times a day (BID) | ORAL | Status: DC

## 2016-04-20 MED ORDER — HYDROCODONE-ACETAMINOPHEN 10-325 MG PO TABS: 1.0000 | ORAL_TABLET | Freq: Three times a day (TID) | ORAL | Status: DC

## 2016-04-20 NOTE — Progress Notes (Signed)
Subjective:    Patient ID: Theresa Huffman, female    DOB: January 25, 1946, 70 y.o.   MRN: [id number]  HPI: [name] is a 70 year old female who returns for follow up for chronic pain and medication refill. She states her pain is located in her bilateral shoulder's, lower back and right knee. She rates her pain 7. Her current exercise regime is walking short distances using loftstrand crutches in her home and performing chair exercises. Husband in room.  Pain Inventory Average Pain 7 Pain Right Now 7 My pain is burning and aching  In the last 24 hours, has pain interfered with the following? General activity 8 Relation with others 5 Enjoyment of life 6 What TIME of day is your pain at its worst? daytime Sleep (in general) Poor  Pain is worse with: na Pain improves with: na Relief from Meds: na  Mobility walk with assistance ability to climb steps?  no do you drive?  yes use a wheelchair Do you have any goals in this area?  yes  Function I need assistance with the following:  meal prep, household duties and shopping Do you have any goals in this area?  yes  Neuro/Psych weakness numbness trouble walking depression  Prior Studies Any changes since last visit?  no  Physicians involved in your care Any changes since last visit?  no   Family History  Problem Relation Age of Onset  . Cancer Mother     lung and colon  . Stroke Sister    Social History   Social History  . Marital Status: Married    Spouse Name: N/A  . Number of Children: 1  . Years of Education: N/A   Occupational History  . Diability    Social History Main Topics  . Smoking status: Never Smoker   . Smokeless tobacco: Never Used  . Alcohol Use: No  . Drug Use: No  . Sexual Activity: Not Asked   Other Topics Concern  . None   Social History Narrative   Lives in [place], one adult child      Caffeine use: coffee daily in am   Past Surgical History  Procedure  Laterality Date  . Cholecystectomy    . Tubal ligation     Past Medical History  Diagnosis Date  . Hyperlipidemia   . Hypertension   . Low back pain   . Morbid obesity ([place])   . Diabetes mellitus     type II  . Thyroid disease     hypothyroidism  . GERD (gastroesophageal reflux disease)   . Lymphedema   . Lumbosacral spondylosis without myelopathy   . Pain in limb   . Lumbago   . Facet syndrome, lumbar   . Lymphedema   . Primary localized osteoarthrosis, lower leg   . Hypercholesteremia    BP 128/75 mmHg  Pulse 92  Resp 16  SpO2 92%  Opioid Risk Score:   Fall Risk Score:  `1  Depression screen PHQ 2/9  Depression screen [facility] 2/9 04/20/2016 01/26/2016 11/24/2015 07/16/2015  Decreased Interest 0 2 0 0  Down, Depressed, Hopeless 0 1 1 1   PHQ - 2 Score 0 3 1 1   Altered sleeping - 3 - 1  Tired, decreased energy - 3 - 1  Change in appetite - 2 - 1  Feeling bad or failure about yourself  - 2 - 1  Trouble concentrating - 0 - 0  Moving slowly or fidgety/restless - 0 -  0  Suicidal thoughts - 0 - 0  PHQ-9 Score - 13 - 5  Difficult doing work/chores - Somewhat difficult - Somewhat difficult       Review of Systems  Constitutional: Negative.   HENT: Negative.   Respiratory: Negative.   Cardiovascular: Negative.   Genitourinary: Negative.   All other systems reviewed and are negative.      Objective:   Physical Exam  Constitutional: She is oriented to person, place, and time. She appears well-developed and well-nourished.  HENT:  Head: Normocephalic and atraumatic.  Neck: Normal range of motion. Neck supple.  Cardiovascular: Normal rate and regular rhythm.   Pulmonary/Chest: Effort normal and breath sounds normal.  Musculoskeletal:  Normal Muscle Bulk and Muscle Testing Reveals: Upper Extremities: Full ROM and Muscle Strength 5/5 Back without spinal tenderness noted Lower Extremities: Full ROM and Muscle Strength 5/5 Bilateral Lower Extremities Flexion Produces  Pain into Bilateral Patella's Arrived in Motorized wheelchair    Neurological: She is alert and oriented to person, place, and time.  Skin: Skin is warm and dry.  Psychiatric: She has a normal mood and affect.  Nursing note and vitals reviewed.         Assessment & Plan:  1. Lumbar pain chronic, no radiculopathy: Lumbar degenerative disc L4-5 and L5-S1.  Refilled: HYDRcodone 10/325mg  one tablet TID #90.  MS CONTIN 30 mg one every 12 hours #60.  We will continue the opioid monitoring program,this consists of regular clinic visits, examinations, urine drug screen, pill counts as well as use of [place] Controlled Substance reporting System. Continue Gabapentin. 2. Bilateral knee osteoarthritis: Continue with Exercise and Heat therapy. Continue current medication regime.  3. Morbid Obesity: Continue with Diabetic Diet Regime and Exercise Regimen. 4. Muscle Spasms: Continue Baclofen  20 minutes of face to face patient care time was spent during this visit. All questions were encouraged and answered.   F/U in 1 month

## 2016-05-02 ENCOUNTER — Other Ambulatory Visit: Payer: Self-pay | Admitting: Family Medicine

## 2016-05-04 ENCOUNTER — Encounter: Payer: Self-pay | Admitting: Cardiology

## 2016-05-04 ENCOUNTER — Ambulatory Visit (INDEPENDENT_AMBULATORY_CARE_PROVIDER_SITE_OTHER): Payer: Medicare Other | Admitting: Cardiology

## 2016-05-04 VITALS — BP 130/64 | HR 95 | Ht 64.0 in | Wt 382.5 lb

## 2016-05-04 DIAGNOSIS — I10 Essential (primary) hypertension: Secondary | ICD-10-CM | POA: Diagnosis not present

## 2016-05-04 DIAGNOSIS — R079 Chest pain, unspecified: Secondary | ICD-10-CM

## 2016-05-04 DIAGNOSIS — R0602 Shortness of breath: Secondary | ICD-10-CM

## 2016-05-04 DIAGNOSIS — E785 Hyperlipidemia, unspecified: Secondary | ICD-10-CM

## 2016-05-04 NOTE — Patient Instructions (Addendum)
Medication Instructions:  Your physician recommends that you continue on your current medications as directed. Please refer to the Current Medication list given to you today.   Labwork: None ordered  Testing/Procedures: Your physician has requested that you have an echocardiogram. Echocardiography is a painless test that uses sound waves to create images of your heart. It provides your doctor with information about the size and shape of your heart and how well your heart's chambers and valves are working. This procedure takes approximately one hour. There are no restrictions for this procedure.  Non-Cardiac CT Angiography (CTA), is a special type of CT scan that uses a computer to produce multi-dimensional views of major blood vessels throughout the body. In CT angiography, a contrast material is injected through an IV to help visualize the blood vessels  Date & Time: Thursday May 05, 2016 at 1:00PM  Nothing to eat or drink for 4 hours prior  [facility] [place], [place] [address] [phone]    [place]  Your caregiver has ordered a Stress Test with nuclear imaging. The purpose of this test is to evaluate the blood supply to your heart muscle. This procedure is referred to as a "Non-Invasive Stress Test." This is because other than having an IV started in your vein, nothing is inserted or "invades" your body. Cardiac stress tests are done to find areas of poor blood flow to the heart by determining the extent of coronary artery disease (CAD). Some patients exercise on a treadmill, which naturally increases the blood flow to your heart, while others who are  unable to walk on a treadmill due to physical limitations have a pharmacologic/chemical stress agent called Lexiscan . This medicine will mimic walking on a treadmill by temporarily increasing your coronary blood flow.   Please note: these test may take anywhere between 2-4 hours  to complete  PLEASE REPORT TO [facility] WILL DIRECT YOU WHERE TO GO  Date of Procedure:_Wednesday July 12th and Thursday July 13th at 08:30AM____  Arrival Time for Procedure:__Arrive at 08:15AM to register___  Instructions regarding medication:   __X__ : Hold diabetes medication morning of procedure Metformin and Glipizide  __X__:  Hold Lasix until after testing  __X__:  Only take 10 units of insulin the night before testing.    PLEASE NOTIFY THE OFFICE AT LEAST 62 HOURS IN ADVANCE IF YOU ARE UNABLE TO KEEP YOUR APPOINTMENT.  [phone] AND  PLEASE NOTIFY NUCLEAR MEDICINE AT [facility] AT LEAST 24 HOURS IN ADVANCE IF YOU ARE UNABLE TO KEEP YOUR APPOINTMENT. [phone]  How to prepare for your Myoview test:   Do not eat or drink after midnight  No caffeine for 24 hours prior to test  No smoking 24 hours prior to test.  Your medication may be taken with water.  If your doctor stopped a medication because of this test, do not take that medication.  Ladies, please do not wear dresses.  Skirts or pants are appropriate. Please wear a short sleeve shirt.  No perfume, cologne or lotion.  Wear comfortable walking shoes. No heels!   Follow-Up: Your physician recommends that you schedule a follow-up appointment after testing with Dr. Yvone Neu.  It was a pleasure seeing you today here in the office. Please do not hesitate to give [place] a call back if you have any further questions. [place], BSN      Any Other Special  Instructions Will Be Listed Below (If Applicable).     If you need a refill on your cardiac medications before your next appointment, please call your pharmacy.  Echocardiogram An echocardiogram, or echocardiography, uses sound waves (ultrasound) to produce an image of your heart. The echocardiogram is simple, painless, obtained within a short period of time, and offers valuable information to  your health care provider. The images from an echocardiogram can provide information such as:  Evidence of coronary artery disease (CAD).  Heart size.  Heart muscle function.  Heart valve function.  Aneurysm detection.  Evidence of a past heart attack.  Fluid buildup around the heart.  Heart muscle thickening.  Assess heart valve function. LET [facility] CARE PROVIDER KNOW ABOUT:  Any allergies you have.  All medicines you are taking, including vitamins, herbs, eye drops, creams, and over-the-counter medicines.  Previous problems you or members of your family have had with the use of anesthetics.  Any blood disorders you have.  Previous surgeries you have had.  Medical conditions you have.  Possibility of pregnancy, if this applies. BEFORE THE PROCEDURE  No special preparation is needed. Eat and drink normally.  PROCEDURE   In order to produce an image of your heart, gel will be applied to your chest and a wand-like tool (transducer) will be moved over your chest. The gel will help transmit the sound waves from the transducer. The sound waves will harmlessly bounce off your heart to allow the heart images to be captured in real-time motion. These images will then be recorded.  You may need an IV to receive a medicine that improves the quality of the pictures. AFTER THE PROCEDURE You may return to your normal schedule including diet, activities, and medicines, unless your health care provider tells you otherwise.   This information is not intended to replace advice given to you by your health care provider. Make sure you discuss any questions you have with your health care provider.   Document Released: 10/14/2000 Document Revised: 11/07/2014 Document Reviewed: 06/24/2013 Elsevier Interactive Patient Education 2016 [place].    Pharmacologic Stress Electrocardiogram A pharmacologic stress electrocardiogram is a heart (cardiac) test that uses nuclear imaging to  evaluate the blood supply to your heart. This test may also be called a pharmacologic stress electrocardiography. Pharmacologic means that a medicine is used to increase your heart rate and blood pressure.  This stress test is done to find areas of poor blood flow to the heart by determining the extent of coronary artery disease (CAD). Some people exercise on a treadmill, which naturally increases the blood flow to the heart. For those people unable to exercise on a treadmill, a medicine is used. This medicine stimulates your heart and will cause your heart to beat harder and more quickly, as if you were exercising.  Pharmacologic stress tests can help determine:  The adequacy of blood flow to your heart during increased levels of activity in order to clear you for discharge home.  The extent of coronary artery blockage caused by CAD.  Your prognosis if you have suffered a heart attack.  The effectiveness of cardiac procedures done, such as an angioplasty, which can increase the circulation in your coronary arteries.  Causes of chest pain or pressure. LET [facility] CARE PROVIDER KNOW ABOUT:  Any allergies you have.  All medicines you are taking, including vitamins, herbs, eye drops, creams, and over-the-counter medicines.  Previous problems you or members of your family have had with the  use of anesthetics.  Any blood disorders you have.  Previous surgeries you have had.  Medical conditions you have.  Possibility of pregnancy, if this applies.  If you are currently breastfeeding. RISKS AND COMPLICATIONS Generally, this is a safe procedure. However, as with any procedure, complications can occur. Possible complications include:  You develop pain or pressure in the following areas:  Chest.  Jaw or neck.  Between your shoulder blades.  Radiating down your left arm.  Headache.  Dizziness or light-headedness.  Shortness of breath.  Increased or irregular  heartbeat.  Low blood pressure.  Nausea or vomiting.  Flushing.  Redness going up the arm and slight pain during injection of medicine.  Heart attack (rare). BEFORE THE PROCEDURE   Avoid all forms of caffeine for 24 hours before your test or as directed by your health care provider. This includes coffee, tea (even decaffeinated tea), caffeinated sodas, chocolate, cocoa, and certain pain medicines.  Follow your health care provider's instructions regarding eating and drinking before the test.  Take your medicines as directed at regular times with water unless instructed otherwise. Exceptions may include:  If you have diabetes, ask how you are to take your insulin or pills. It is common to adjust insulin dosing the morning of the test.  If you are taking beta-blocker medicines, it is important to talk to your health care provider about these medicines well before the date of your test. Taking beta-blocker medicines may interfere with the test. In some cases, these medicines need to be changed or stopped 24 hours or more before the test.  If you wear a nitroglycerin patch, it may need to be removed prior to the test. Ask your health care provider if the patch should be removed before the test.  If you use an inhaler for any breathing condition, bring it with you to the test.  If you are an outpatient, bring a snack so you can eat right after the stress phase of the test.  Do not smoke for 4 hours prior to the test or as directed by your health care provider.  Do not apply lotions, powders, creams, or oils on your chest prior to the test.  Wear comfortable shoes and clothing. Let your health care provider know if you were unable to complete or follow the preparations for your test. PROCEDURE   Multiple patches (electrodes) will be put on your chest. If needed, small areas of your chest may be shaved to get better contact with the electrodes. Once the electrodes are attached to your  body, multiple wires will be attached to the electrodes, and your heart rate will be monitored.  An IV access will be started. A nuclear trace (isotope) is given. The isotope may be given intravenously, or it may be swallowed. Nuclear refers to several types of radioactive isotopes, and the nuclear isotope lights up the arteries so that the nuclear images are clear. The isotope is absorbed by your body. This results in low radiation exposure.  A resting nuclear image is taken to show how your heart functions at rest.  A medicine is given through the IV access.  A second scan is done about 1 hour after the medicine injection and determines how your heart functions under stress.  During this stress phase, you will be connected to an electrocardiogram machine. Your blood pressure and oxygen levels will be monitored. AFTER THE PROCEDURE   Your heart rate and blood pressure will be monitored after the test.  You may return to your normal schedule, including diet,activities, and medicines, unless your health care provider tells you otherwise.   This information is not intended to replace advice given to you by your health care provider. Make sure you discuss any questions you have with your health care provider.   Document Released: 03/05/2009 Document Revised: 10/22/2013 Document Reviewed: 06/24/2013 Elsevier Interactive Patient Education [employer].

## 2016-05-04 NOTE — Progress Notes (Signed)
Cardiology Office Note   Date:  05/04/2016   ID:  ZYAIR PERELMAN, DOB 01/27/1946, MRN [id number]  Referring Doctor:  Arnette Norris, MD   Cardiologist:   Wende Bushy, MD   Reason for consultation:  Chief Complaint  Patient presents with  . other    Ref by Dr. Deborra Medina for shortness of breath. Former patient of Dr. Aundra Dubin in 2013.Pt. c/o shortness of breath and chest tightness. Meds reviewed by the patient verbally.       History of Present Illness: Theresa Huffman is a 70 y.o. female who presents for Shortness of breath. Symptoms have been going on for the last 8 months now. Prior to that, patient could walk around the house, around inside the house, with her crutches with a little shortness of breath. But since then, she has progressive worsening of dyspnea to the point of moderate severe intensity shortness of breath with walking back and forth to the bathroom. She also mentions mild chest pain in the center of her chest, nonradiating. Occurs with exertion. Symptoms lasted minutes at a time.   ROS:  Please see the history of present illness. Aside from mentioned under HPI, all other systems are reviewed and negative.     Past Medical History  Diagnosis Date  . Hyperlipidemia   . Hypertension   . Low back pain   . Morbid obesity ([place])   . Diabetes mellitus     type II  . Thyroid disease     hypothyroidism  . GERD (gastroesophageal reflux disease)   . Lymphedema   . Lumbosacral spondylosis without myelopathy   . Pain in limb   . Lumbago   . Facet syndrome, lumbar   . Lymphedema   . Primary localized osteoarthrosis, lower leg   . Hypercholesteremia     Past Surgical History  Procedure Laterality Date  . Cholecystectomy    . Tubal ligation       reports that she has never smoked. She has never used smokeless tobacco. She reports that she does not drink alcohol or use illicit drugs.   family history includes Cancer in her mother; Stroke in her sister.    Outpatient Prescriptions Prior to Visit  Medication Sig Dispense Refill  . albuterol (PROAIR HFA) 108 (90 BASE) MCG/ACT inhaler Inhale 2 puffs into the lungs every 4 (four) hours as needed. 1 Inhaler 1  . amLODipine (NORVASC) 5 MG tablet TAKE 1 TABLET BY MOUTH DAILY 90 tablet 0  . aspirin EC 81 MG tablet Take 162 mg by mouth daily.    . baclofen (LIORESAL) 10 MG tablet TAKE 1 TABLET BY MOUTH THREE TIMES A DAY 90 each 2  . Calcium Carbonate-Vitamin D 600-400 MG-UNIT per tablet Take 1 tablet by mouth daily. 1200mg  once daily    . furosemide (LASIX) 40 MG tablet TAKE 1 TABLET BY MOUTH TWICE A DAY 180 tablet 0  . gabapentin (NEURONTIN) 300 MG capsule TAKE TWO CAPSULES BY MOUTH THREE TIMES DAILY 180 capsule 2  . glipiZIDE (GLUCOTROL) 10 MG tablet Take 1 tablet (10 mg total) by mouth 2 (two) times daily before a meal. 60 tablet 2  . HYDROcodone-acetaminophen (NORCO) 10-325 MG tablet Take 1 tablet by mouth 3 (three) times daily. As needed for back or leg pain 90 tablet 0  . ibuprofen (ADVIL,MOTRIN) 800 MG tablet TAKE 1 TABLET BY MOUTH 3 TIMES DAILY 90 tablet 3  . Insulin NPH, Human,, Isophane, (HUMULIN N) 100 UNIT/ML Kiwkpen Inject units 20 units  before breakfast and 15 units before bedtime (Patient taking differently: Inject units 20 units before breakfast and 25 units before bedtime) 15 mL 2  . levothyroxine (SYNTHROID) 200 MCG tablet Take 1 tablet (200 mcg total) by mouth daily before breakfast. 90 tablet 1  . metFORMIN (GLUCOPHAGE) 1000 MG tablet TAKE 1 TABLET BY MOUTH TWICE A DAY 60 tablet 0  . morphine (MS CONTIN) 30 MG 12 hr tablet Take 1 tablet (30 mg total) by mouth every 12 (twelve) hours. 60 tablet 0  . potassium chloride SA (K-DUR,KLOR-CON) 20 MEQ tablet TAKE 1 TABLET BY MOUTH DAILY 90 tablet 2  . simvastatin (ZOCOR) 20 MG tablet TAKE 1 TABLET BY MOUTH DAILY 30 tablet 5  . venlafaxine (EFFEXOR) 75 MG tablet Take three tablets by mouth every night at bedtime 90 tablet 3   No  facility-administered medications prior to visit.     Allergies: Ace inhibitors; Erythromycin; and Invokana    PHYSICAL EXAM: VS:  BP 130/64 mmHg  Pulse 95  Ht 5\' 4"  (1.626 m)  Wt 382 lb 8 oz (173.501 kg)  BMI 65.62 kg/m2  SpO2 89% , Body mass index is 65.62 kg/(m^2). Wt Readings from Last 3 Encounters:  05/04/16 382 lb 8 oz (173.501 kg)  04/13/16 382 lb 8 oz (173.501 kg)  12/29/15 372 lb (168.738 kg)    GENERAL:  well developed, well nourished, Morbidly obese, not in acute distress HEENT: normocephalic, pink conjunctivae, anicteric sclerae, no xanthelasma, normal dentition, oropharynx clear NECK:  no neck vein engorgement, JVP normal, no hepatojugular reflux, carotid upstroke brisk and symmetric, no bruit, no thyromegaly, no lymphadenopathy LUNGS:  good respiratory effort, clear to auscultation bilaterally CV:  PMI not displaced, no thrills, no lifts, S1 and S2 within normal limits, no palpable S3 or S4, no murmurs, no rubs, no gallops ABD:  Soft, nontender, nondistended, normoactive bowel sounds, no abdominal aortic bruit, no hepatomegaly, no splenomegaly MS: nontender back, no kyphosis, no scoliosis, no joint deformities EXT:  2+ DP/PT pulses, no edema, (+) lymph edema, no varicosities, no cyanosis, no clubbing SKIN: warm, nondiaphoretic, normal turgor, no ulcers NEUROPSYCH: alert, oriented to person, place, and time, sensory/motor grossly intact, normal mood, appropriate affect  Recent Labs: 04/13/2016: ALT 18; BUN 21; Creatinine, Ser 0.92; Hemoglobin 13.0; Platelets 308.0; Potassium 4.3; Sodium 140; TSH 2.32   Lipid Panel    Component Value Date/Time   CHOL 150 04/23/2015 1340   TRIG 177.0* 04/23/2015 1340   HDL 47.10 04/23/2015 1340   CHOLHDL 3 04/23/2015 1340   VLDL 35.4 04/23/2015 1340   LDLCALC 68 04/23/2015 1340   LDLDIRECT 92.5 08/25/2014 1133     Other studies Reviewed:  EKG:  The ekg from 05/04/2016 was personally reviewed by me and it revealed sinus rhythm,  95 BPM. LVH.  Additional studies/ records that were reviewed personally reviewed by me today include:  Echo 08/28/2012 Left ventricle: The cavity size was normal. Wall thickness was normal. Systolic function was normal. The estimated ejection fraction was in the range of 50% to 55%. Regional wall motion abnormalities cannot be excluded. Doppler parameters are consistent with abnormal left ventricular relaxation (grade 1 diastolic dysfunction).  ASSESSMENT AND PLAN:  Shortness of breath Chest pain O2 sat today is noted to be 89%, it was 88% back on 04/13/2016 during her PCP visit With her morbid obesity, she is at risk for pulmonary embolism. Recommend to do a CTA to rule out PE. If this is negative for PE, recommend to set her up  for a 2 day pharmacologic nuclear stress test. Recommend echocardiogram.  Hypertension BP is well controlled. Continue monitoring BP. Continue current medical therapy and lifestyle changes.  Hyperlipidemia LDL goal is < 70, due to diabetes. PCP following labs. Continue current medical therapy and lifestyle changes.  Morbid obesity  Current medicines are reviewed at length with the patient today.  The patient does not have concerns regarding medicines.  Labs/ tests ordered today include:  Orders Placed This Encounter  Procedures  . CT ANGIO CHEST PE W OR WO CONTRAST  . NM Myocar Multi W/Spect W/Wall Motion / EF  . EKG 12-Lead  . ECHOCARDIOGRAM COMPLETE    I had a lengthy and detailed discussion with the patient regarding diagnoses, prognosis, diagnostic options, treatment options , and side effects of medications.   I counseled the patient on importance of lifestyle modification including heart healthy diet, regular physical activity .   Disposition:   FU with undersigned after tests    Signed, Wende Bushy, MD  05/04/2016 2:53 PM    [place]

## 2016-05-04 NOTE — Addendum Note (Signed)
Addended by: Wende Bushy on: 05/04/2016 02:59 PM   Modules accepted: Level of Service

## 2016-05-04 NOTE — Telephone Encounter (Signed)
This pt has not had BP f/u since 05/2014

## 2016-05-05 ENCOUNTER — Ambulatory Visit
Admission: RE | Admit: 2016-05-05 | Discharge: 2016-05-05 | Disposition: A | Discharge status: Home / Self Care | Payer: Medicare Other | Source: Ambulatory Visit | Attending: Cardiology | Admitting: Cardiology

## 2016-05-05 DIAGNOSIS — J9811 Atelectasis: Secondary | ICD-10-CM | POA: Diagnosis not present

## 2016-05-05 DIAGNOSIS — R079 Chest pain, unspecified: Secondary | ICD-10-CM | POA: Insufficient documentation

## 2016-05-05 DIAGNOSIS — R0602 Shortness of breath: Secondary | ICD-10-CM | POA: Insufficient documentation

## 2016-05-05 DIAGNOSIS — I2510 Atherosclerotic heart disease of native coronary artery without angina pectoris: Secondary | ICD-10-CM | POA: Diagnosis not present

## 2016-05-05 MED ORDER — IOPAMIDOL (ISOVUE-370) INJECTION 76%
125.0000 mL | Freq: Once | INTRAVENOUS | Status: AC | PRN
  Administered 2016-05-05: 80 mL via INTRAVENOUS

## 2016-05-10 ENCOUNTER — Telehealth: Payer: Self-pay | Admitting: Cardiology

## 2016-05-10 NOTE — Telephone Encounter (Signed)
Reviewed stress test instructions, time, and location with patient and she verbalized understanding with no further questions at this time.

## 2016-05-11 ENCOUNTER — Encounter
Admission: RE | Admit: 2016-05-11 | Discharge: 2016-05-11 | Disposition: A | Discharge status: Home / Self Care | Payer: Medicare Other | Source: Ambulatory Visit | Attending: Cardiology | Admitting: Cardiology

## 2016-05-11 ENCOUNTER — Other Ambulatory Visit: Payer: Medicare Other

## 2016-05-11 DIAGNOSIS — R931 Abnormal findings on diagnostic imaging of heart and coronary circulation: Secondary | ICD-10-CM | POA: Insufficient documentation

## 2016-05-11 DIAGNOSIS — R079 Chest pain, unspecified: Secondary | ICD-10-CM | POA: Diagnosis not present

## 2016-05-11 DIAGNOSIS — I510 Cardiac septal defect, acquired: Secondary | ICD-10-CM | POA: Diagnosis not present

## 2016-05-11 DIAGNOSIS — R0602 Shortness of breath: Secondary | ICD-10-CM

## 2016-05-11 MED ORDER — TECHNETIUM TC 99M TETROFOSMIN IV KIT
32.0600 | PACK | Freq: Once | INTRAVENOUS | Status: AC | PRN
  Administered 2016-05-11: 32.06 via INTRAVENOUS

## 2016-05-11 MED ORDER — REGADENOSON 0.4 MG/5ML IV SOLN
0.4000 mg | Freq: Once | INTRAVENOUS | Status: AC
  Administered 2016-05-11: 0.4 mg via INTRAVENOUS

## 2016-05-12 ENCOUNTER — Encounter
Admission: RE | Admit: 2016-05-12 | Discharge: 2016-05-12 | Disposition: A | Discharge status: Home / Self Care | Payer: Medicare Other | Source: Ambulatory Visit | Attending: Cardiology | Admitting: Cardiology

## 2016-05-12 DIAGNOSIS — R0602 Shortness of breath: Secondary | ICD-10-CM | POA: Diagnosis not present

## 2016-05-12 DIAGNOSIS — R931 Abnormal findings on diagnostic imaging of heart and coronary circulation: Secondary | ICD-10-CM | POA: Diagnosis not present

## 2016-05-12 DIAGNOSIS — R079 Chest pain, unspecified: Secondary | ICD-10-CM | POA: Diagnosis not present

## 2016-05-12 DIAGNOSIS — I510 Cardiac septal defect, acquired: Secondary | ICD-10-CM | POA: Diagnosis not present

## 2016-05-12 LAB — NM MYOCAR MULTI W/SPECT W/WALL MOTION / EF
CHL CUP RESTING HR STRESS: 110 {beats}/min
CSEPPHR: 131 {beats}/min
LV dias vol: 183 mL (ref 46–106)
LVSYSVOL: 110 mL
NUC STRESS TID: 1.23
Percent HR: 85 %
SDS: 1
SRS: 9
SSS: 7

## 2016-05-12 MED ORDER — TECHNETIUM TC 99M TETROFOSMIN IV KIT
31.3900 | PACK | Freq: Once | INTRAVENOUS | Status: AC | PRN
  Administered 2016-05-12: 31.39 via INTRAVENOUS

## 2016-05-16 ENCOUNTER — Other Ambulatory Visit: Payer: Self-pay | Admitting: Family Medicine

## 2016-05-16 ENCOUNTER — Other Ambulatory Visit: Payer: Self-pay | Admitting: Internal Medicine

## 2016-05-17 ENCOUNTER — Ambulatory Visit (INDEPENDENT_AMBULATORY_CARE_PROVIDER_SITE_OTHER): Payer: Medicare Other | Admitting: Physician Assistant

## 2016-05-17 ENCOUNTER — Telehealth: Payer: Self-pay | Admitting: Cardiology

## 2016-05-17 ENCOUNTER — Encounter: Payer: Self-pay | Admitting: Physician Assistant

## 2016-05-17 VITALS — BP 145/72 | HR 93 | Ht 64.0 in | Wt 372.0 lb

## 2016-05-17 DIAGNOSIS — I10 Essential (primary) hypertension: Secondary | ICD-10-CM

## 2016-05-17 DIAGNOSIS — I890 Lymphedema, not elsewhere classified: Secondary | ICD-10-CM

## 2016-05-17 DIAGNOSIS — R931 Abnormal findings on diagnostic imaging of heart and coronary circulation: Secondary | ICD-10-CM

## 2016-05-17 DIAGNOSIS — R0602 Shortness of breath: Secondary | ICD-10-CM

## 2016-05-17 DIAGNOSIS — E785 Hyperlipidemia, unspecified: Secondary | ICD-10-CM | POA: Diagnosis not present

## 2016-05-17 DIAGNOSIS — R9439 Abnormal result of other cardiovascular function study: Secondary | ICD-10-CM

## 2016-05-17 DIAGNOSIS — R000 Tachycardia, unspecified: Secondary | ICD-10-CM

## 2016-05-17 DIAGNOSIS — Z01812 Encounter for preprocedural laboratory examination: Secondary | ICD-10-CM

## 2016-05-17 NOTE — Telephone Encounter (Signed)
Attempted to notify pt that August 3 cath arrival time is 7:30am.  Left message on machine for patient to contact the office.

## 2016-05-17 NOTE — Telephone Encounter (Signed)
Ok to refill one time only.  Needs lab visit for further refills.

## 2016-05-17 NOTE — Telephone Encounter (Signed)
Last labs 04/2015

## 2016-05-17 NOTE — Patient Instructions (Addendum)
Medication Instructions:  Your physician recommends that you continue on your current medications as directed. Please refer to the Current Medication list given to you today.   Labwork: BMET, CBC, PT/INR after July 20 but before August 1  Testing/Procedures: A chest x-ray takes a picture of the organs and structures inside the chest, including the heart, lungs, and blood vessels. This test can show several things, including, whether the heart is enlarges; whether fluid is building up in the lungs; and whether pacemaker / defibrillator leads are still in place.  Your physician has requested that you have a cardiac catheterization. Cardiac catheterization is used to diagnose and/or treat various heart conditions. Doctors may recommend this procedure for a number of different reasons. The most common reason is to evaluate chest pain. Chest pain can be a symptom of coronary artery disease (CAD), and cardiac catheterization can show whether plaque is narrowing or blocking your heart's arteries. This procedure is also used to evaluate the valves, as well as measure the blood flow and oxygen levels in different parts of your heart. For further information please visit [email or web address]. Please follow instruction sheet, as given.  [facility] Cardiac Cath Instructions   You are scheduled for a Cardiac Cath on: Thursday, August 3  Please arrive at _______am on the day of your procedure  Please expect a call from our [facility] to pre-register you  Do not eat/drink anything after midnight  Someone will need to drive you home  It is recommended someone be with you for the first 24 hours after your procedure  Wear clothes that are easy to get on/off and wear slip on shoes if possible   Medications bring a current list of all medications with you  _xx__ Do not take these medications before your procedure Hold metformin 48 hours before and 48 hours after procedure. Do not take  morning dose of insulin the morning of your procedure. Hold glipizide the morning of your procedure.   Day of your procedure: [facility].  Free valet service is available.  After entering the [place] please check-in at the registration desk (1st desk on your right) to receive your armband. After receiving your armband someone will escort you to the cardiac cath/special procedures waiting area.  The usual length of stay after your procedure is about 2 to 3 hours.  This can vary.  If you have any questions, please call our office at [phone], or you may call the cardiac cath lab at [facility] directly at [phone]   Follow-Up: Your physician recommends that you schedule a follow-up appointment after cath with Dr. Yvone Neu   Any Other Special Instructions Will Be Listed Below (If Applicable).     If you need a refill on your cardiac medications before your next appointment, please call your pharmacy.  Angiogram An angiogram, also called angiography, is a procedure used to look at the blood vessels. In this procedure, dye is injected through a long, thin tube (catheter) into an artery. X-rays are then taken. The X-rays will show if there is a blockage or problem in a blood vessel.  LET [facility] CARE PROVIDER KNOW ABOUT:  Any allergies you have, including allergies to shellfish or contrast dye.   All medicines you are taking, including vitamins, herbs, eye drops, creams, and over-the-counter medicines.   Previous problems you or members of your family have had with the use of anesthetics.   Any blood disorders you have.   Previous  surgeries you have had.  Any previous kidney problems or failure you have had.  Medical conditions you have.   Possibility of pregnancy, if this applies. RISKS AND COMPLICATIONS Generally, an angiogram is a safe procedure. However, as with any procedure, problems can occur. Possible problems include:  Injury to the  blood vessels, including rupture or bleeding.  Infection or bruising at the catheter site.  Allergic reaction to the dye or contrast used.  Kidney damage from the dye or contrast used.  Blood clots that can lead to a stroke or heart attack. BEFORE THE PROCEDURE  Do not eat or drink after midnight on the night before the procedure, or as directed by your health care provider.   Ask your health care provider if you may drink enough water to take any needed medicines the morning of the procedure.  PROCEDURE  You may be given a medicine to help you relax (sedative) before and during the procedure. This medicine is given through an IV access tube that is inserted into one of your veins.   The area where the catheter will be inserted will be washed and shaved. This is usually done in the groin but may be done in the fold of your arm (near your elbow) or in the wrist.  A medicine will be given to numb the area where the catheter will be inserted (local anesthetic).  The catheter will be inserted with a guide wire into an artery. The catheter is guided by using a type of X-ray (fluoroscopy) to the blood vessel being examined.   Dye is then injected into the catheter, and X-rays are taken. The dye helps to show where any narrowing or blockages are located.  AFTER THE PROCEDURE   If the procedure is done through the leg, you will be kept in bed lying flat for several hours. You will be instructed to not bend or cross your legs.  The insertion site will be checked frequently.  The pulse in your feet or wrist will be checked frequently.  Additional blood tests, X-rays, and electrocardiography may be done.   You may need to stay in the hospital overnight for observation.    This information is not intended to replace advice given to you by your health care provider. Make sure you discuss any questions you have with your health care provider.   Document Released: 07/27/2005 Document  Revised: 11/07/2014 Document Reviewed: 03/20/2013 Elsevier Interactive Patient Education 2016 [place] After Refer to this sheet in the next few weeks. These instructions provide you with information about caring for yourself after your procedure. Your health care provider may also give you more specific instructions. Your treatment has been planned according to current medical practices, but problems sometimes occur. Call your health care provider if you have any problems or questions after your procedure. WHAT TO EXPECT AFTER THE PROCEDURE After your procedure, it is typical to have the following:  Bruising at the catheter insertion site that usually fades within 1-2 weeks.  Blood collecting in the tissue (hematoma) that may be painful to the touch. It should usually decrease in size and tenderness within 1-2 weeks. HOME CARE INSTRUCTIONS  Take medicines only as directed by your health care provider.  You may shower 24-48 hours after the procedure or as directed by your health care provider. Remove the bandage (dressing) and gently wash the site with plain soap and water. Pat the area dry with a clean towel. Do not rub the site,  because this may cause bleeding.  Do not take baths, swim, or use a hot tub until your health care provider approves.  Check your insertion site every day for redness, swelling, or drainage.  Do not apply powder or lotion to the site.  Do not lift over 10 lb (4.5 kg) for 5 days after your procedure or as directed by your health care provider.  Ask your health care provider when it is okay to:  Return to work or school.  Resume usual physical activities or sports.  Resume sexual activity.  Do not drive home if you are discharged the same day as the procedure. Have someone else drive you.  You may drive 24 hours after the procedure unless otherwise instructed by your health care provider.  Do not operate machinery or power tools for  24 hours after the procedure or as directed by your health care provider.  If your procedure was done as an outpatient procedure, which means that you went home the same day as your procedure, a responsible adult should be with you for the first 24 hours after you arrive home.  Keep all follow-up visits as directed by your health care provider. This is important. SEEK MEDICAL CARE IF:  You have a fever.  You have chills.  You have increased bleeding from the catheter insertion site. Hold pressure on the site. SEEK IMMEDIATE MEDICAL CARE IF:  You have unusual pain at the catheter insertion site.  You have redness, warmth, or swelling at the catheter insertion site.  You have drainage (other than a small amount of blood on the dressing) from the catheter insertion site.  The catheter insertion site is bleeding, and the bleeding does not stop after 30 minutes of holding steady pressure on the site.  The area near or just beyond the catheter insertion site becomes pale, cool, tingly, or numb.   This information is not intended to replace advice given to you by your health care provider. Make sure you discuss any questions you have with your health care provider.   Document Released: 05/05/2005 Document Revised: 11/07/2014 Document Reviewed: 03/20/2013 Elsevier Interactive Patient Education [employer].

## 2016-05-17 NOTE — Progress Notes (Signed)
Cardiology Office Note Date:  05/17/2016  Patient ID:  Theresa Huffman [birth year], MRN [id number] PCP:  Arnette Norris, MD  Cardiologist:  Dr. Yvone Neu, MD    Chief Complaint: Follow up of abnormal stress test  History of Present Illness: Theresa Huffman is a 70 y.o. female with history of HTN, HLD, morbid obesity, lymphedema, hypothyroidism, DM2, and chronic back pain who was recently seen by Dr. Yvone Neu, MD on 05/04/16 for increased SOB. At that time she reported increased SOB for at least the prior 8 months. Prior to her increased SOB she was able to ambulate with her crutches with mild SOB. However, over the prior 8 months her SOb has been progressively worsening to the point of moderate-severe intensity with ambulation in her house. She also mentioned some centralized chest pain that occured with exeriton. She underwent Lexiscan Myoview on 05/12/16 that showed a small defect of moderate severity in the mid anterior and apical location that was partially reversible at rest. Could not rule out ischemia. EF 42%. TID ratio was increased at 1.23 possibly consistent with balanced ischemia. Frequent PVCs were noted. She was notified of her results on 05/12/16. She comes in today to discuss LHC with Dr. Fletcher Anon, MD.   She continues to have SOB that has worsened over the past 1 month. No chest pain, palpitations, nausea, vomiting, or diaphoresis. Her SOB is only associated with exertion (walking to the restroom at home) at this time. When she is at rest or using her wheelchair she is asymptomatic. She is currently without SOB in her wheelchair.    Past Medical History  Diagnosis Date  . Hyperlipidemia   . Essential hypertension   . Low back pain   . Morbid obesity ([place])   . Diabetes mellitus type 2 in obese ([place])   . Hypothyroidism   . GERD (gastroesophageal reflux disease)   . Lymphedema   . Lumbosacral spondylosis without myelopathy   . Pain in limb   . Lumbago   . Facet syndrome, lumbar     . Lymphedema   . Primary localized osteoarthrosis, lower leg   . CAD (coronary artery disease)     a. Lexiscan 05/12/16: smal defect of moderate severity in the mid anterior and apical location opartially reversible at rest. EF 42%. TID ratio uincreased at 1.23 possibly c/w balanced ischemia. Frequent PVCs    Past Surgical History  Procedure Laterality Date  . Cholecystectomy    . Tubal ligation      Current Outpatient Prescriptions  Medication Sig Dispense Refill  . albuterol (PROAIR HFA) 108 (90 BASE) MCG/ACT inhaler Inhale 2 puffs into the lungs every 4 (four) hours as needed. 1 Inhaler 1  . amLODipine (NORVASC) 5 MG tablet TAKE 1 TABLET BY MOUTH DAILY 90 tablet 0  . aspirin EC 81 MG tablet Take 162 mg by mouth daily.    . baclofen (LIORESAL) 10 MG tablet TAKE 1 TABLET BY MOUTH THREE TIMES A DAY 90 each 2  . Calcium Carbonate-Vitamin D 600-400 MG-UNIT per tablet Take 1 tablet by mouth daily. 1200mg  once daily    . furosemide (LASIX) 40 MG tablet TAKE 1 TABLET BY MOUTH TWICE A DAY 180 tablet 0  . gabapentin (NEURONTIN) 300 MG capsule TAKE TWO CAPSULES BY MOUTH THREE TIMES DAILY 180 capsule 2  . glipiZIDE (GLUCOTROL) 10 MG tablet TAKE 1 TABLET BY MOUTH TWICE A DAY BEFORE A MEAL 60 tablet 0  . HYDROcodone-acetaminophen (NORCO) 10-325 MG tablet Take 1 tablet  by mouth 3 (three) times daily. As needed for back or leg pain 90 tablet 0  . ibuprofen (ADVIL,MOTRIN) 800 MG tablet TAKE 1 TABLET BY MOUTH 3 TIMES DAILY 90 tablet 3  . Insulin NPH, Human,, Isophane, (HUMULIN N) 100 UNIT/ML Kiwkpen Inject units 20 units before breakfast and 15 units before bedtime (Patient taking differently: Inject units 20 units before breakfast and 25 units before bedtime) 15 mL 2  . levothyroxine (SYNTHROID) 200 MCG tablet Take 1 tablet (200 mcg total) by mouth daily before breakfast. 90 tablet 1  . metFORMIN (GLUCOPHAGE) 1000 MG tablet TAKE 1 TABLET BY MOUTH TWICE A DAY 60 tablet 0  . morphine (MS CONTIN) 30 MG 12  hr tablet Take 1 tablet (30 mg total) by mouth every 12 (twelve) hours. 60 tablet 0  . potassium chloride SA (K-DUR,KLOR-CON) 20 MEQ tablet TAKE 1 TABLET BY MOUTH DAILY 90 tablet 2  . simvastatin (ZOCOR) 20 MG tablet Take 1 tablet (20 mg total) by mouth daily. LABS REQUIRED FOR ADDITIONAL REFILLS 30 tablet 0  . venlafaxine (EFFEXOR) 75 MG tablet Take three tablets by mouth every night at bedtime 90 tablet 3   No current facility-administered medications for this visit.    Allergies:   Ace inhibitors; Erythromycin; and Invokana   Social History:  The patient  reports that she has never smoked. She has never used smokeless tobacco. She reports that she does not drink alcohol or use illicit drugs.   Family History:  The patient's family history includes Cancer in her mother; Stroke in her sister.  ROS:   Review of Systems  Constitutional: Positive for malaise/fatigue. Negative for fever, chills, weight loss and diaphoresis.  HENT: Negative for congestion.   Eyes: Negative for discharge and redness.  Respiratory: Positive for shortness of breath. Negative for cough, sputum production and wheezing.   Cardiovascular: Negative for chest pain, palpitations, orthopnea, claudication, leg swelling and PND.  Gastrointestinal: Negative for heartburn, nausea, vomiting and abdominal pain.  Musculoskeletal: Negative for myalgias and falls.  Skin: Negative for rash.  Neurological: Positive for weakness. Negative for dizziness, tingling, tremors, sensory change, speech change, focal weakness and loss of consciousness.  Endo/Heme/Allergies: Does not bruise/bleed easily.  Psychiatric/Behavioral: Negative for substance abuse. The patient is not nervous/anxious.   All other systems reviewed and are negative.    PHYSICAL EXAM:  VS:  BP 145/72 mmHg  Pulse 93  Ht 5\' 4"  (1.626 m)  Wt 372 lb (168.738 kg)  BMI 63.82 kg/m2 BMI: Body mass index is 63.82 kg/(m^2).  Physical Exam  Constitutional: She is  oriented to person, place, and time. She appears well-developed and well-nourished.  HENT:  Head: Normocephalic and atraumatic.  Eyes: Right eye exhibits no discharge. Left eye exhibits no discharge.  Neck: Normal range of motion. No JVD present.  Cardiovascular: Normal rate, regular rhythm, S1 normal, S2 normal and normal heart sounds.  Exam reveals no distant heart sounds, no friction rub, no midsystolic click and no opening snap.   No murmur heard. Pulses:      Radial pulses are 2+ on the right side, and 2+ on the left side.  Pulmonary/Chest: Effort normal and breath sounds normal. No respiratory distress. She has no decreased breath sounds. She has no wheezes. She has no rales. She exhibits no tenderness.  Abdominal: Soft. She exhibits no distension. There is no tenderness.  Musculoskeletal: She exhibits edema.  Neurological: She is alert and oriented to person, place, and time.  Skin: Skin is warm  and dry. No cyanosis. Nails show no clubbing.  Psychiatric: She has a normal mood and affect. Her speech is normal and behavior is normal. Judgment and thought content normal.     EKG:  Was ordered and interpreted by me today. Shows NSR, 93 bpm, no acute st/t changes   Recent Labs: 04/13/2016: ALT 18; BUN 21; Creatinine, Ser 0.92; Hemoglobin 13.0; Platelets 308.0; Potassium 4.3; Sodium 140; TSH 2.32  No results found for requested labs within last 365 days.   CrCl cannot be calculated (Patient has no serum creatinine result on file.).   Wt Readings from Last 3 Encounters:  05/17/16 372 lb (168.738 kg)  05/04/16 382 lb 8 oz (173.501 kg)  04/13/16 382 lb 8 oz (173.501 kg)     Other studies reviewed: Additional studies/records reviewed today include: summarized above  ASSESSMENT AND PLAN:  1. Abnormal nuclear stress test/SOB/angina: Still notes SOB. Likely multifactorial including possible ischemia based on her abnormal stress test, super obesity, and possible OHS. Schedule for LHC  with Dr. Fletcher Anon, MD on 06/02/16, first case. Risks and benefits of cardiac catheterization have been discussed with the patient including risks of bleeding, bruising, infection, kidney damage, stroke, heart attack, and death. The patient understands these risks and is willing to proceed with the procedure. All questions have been answered and concerns listened to. Continue aspirin 81 mg daily.   2. HTN: Well controlled. Continue current medications.   3. HLD: Simvastatin.  4. IDDM: Continue insulin per PCP. Hold metformin pre-cath and post cath for 48 hours. Would ideally need to be an early case for cardiac cath given her insulin requirement.   5. Lymphedema: Stable. No longer wearing ACE wraps. She does not want to wear them any longer.   6. Super obesity/possibel OHS: She would benefit from cardiac rehab s/p cardiac catheterization.   Disposition: F/u with Dr. Yvone Neu, MD after cardiac cath  Current medicines are reviewed at length with the patient today.  The patient did not have any concerns regarding medicines.  Melvern Banker PA-C 05/17/2016 3:26 PM     Theresa Huffman [address] [place], [place] [address] [phone]

## 2016-05-18 NOTE — Telephone Encounter (Signed)
S/w pt to confirm 7:30 arrival Aug 3 for cardiac cath.  Pt agreeable w/plan w/no questions at this time.

## 2016-05-20 ENCOUNTER — Encounter: Payer: Medicare Other | Attending: Physical Medicine & Rehabilitation

## 2016-05-20 ENCOUNTER — Encounter: Payer: Self-pay | Admitting: Physical Medicine & Rehabilitation

## 2016-05-20 ENCOUNTER — Ambulatory Visit (HOSPITAL_BASED_OUTPATIENT_CLINIC_OR_DEPARTMENT_OTHER): Payer: Medicare Other | Admitting: Physical Medicine & Rehabilitation

## 2016-05-20 ENCOUNTER — Ambulatory Visit: Payer: Medicare Other | Admitting: Physical Medicine & Rehabilitation

## 2016-05-20 VITALS — BP 135/80 | HR 93 | Resp 17

## 2016-05-20 DIAGNOSIS — Z5181 Encounter for therapeutic drug level monitoring: Secondary | ICD-10-CM | POA: Insufficient documentation

## 2016-05-20 DIAGNOSIS — M5137 Other intervertebral disc degeneration, lumbosacral region: Secondary | ICD-10-CM

## 2016-05-20 DIAGNOSIS — G8929 Other chronic pain: Secondary | ICD-10-CM | POA: Insufficient documentation

## 2016-05-20 DIAGNOSIS — M75102 Unspecified rotator cuff tear or rupture of left shoulder, not specified as traumatic: Secondary | ICD-10-CM | POA: Diagnosis not present

## 2016-05-20 DIAGNOSIS — Z79899 Other long term (current) drug therapy: Secondary | ICD-10-CM | POA: Insufficient documentation

## 2016-05-20 DIAGNOSIS — M25562 Pain in left knee: Secondary | ICD-10-CM | POA: Diagnosis not present

## 2016-05-20 DIAGNOSIS — M7541 Impingement syndrome of right shoulder: Secondary | ICD-10-CM | POA: Diagnosis not present

## 2016-05-20 DIAGNOSIS — M25561 Pain in right knee: Secondary | ICD-10-CM | POA: Insufficient documentation

## 2016-05-20 DIAGNOSIS — M174 Other bilateral secondary osteoarthritis of knee: Secondary | ICD-10-CM

## 2016-05-20 MED ORDER — HYDROCODONE-ACETAMINOPHEN 10-325 MG PO TABS: 1.0000 | ORAL_TABLET | Freq: Three times a day (TID) | ORAL | Status: DC

## 2016-05-20 MED ORDER — MORPHINE SULFATE ER 30 MG PO TBCR: 30.0000 mg | EXTENDED_RELEASE_TABLET | Freq: Two times a day (BID) | ORAL | Status: DC

## 2016-05-20 NOTE — Patient Instructions (Signed)
Continue your exercises. Do them once or twice a day

## 2016-05-20 NOTE — Progress Notes (Signed)
Subjective:    Patient ID: Theresa Huffman, female    DOB: 1946-01-07, 70 y.o.   MRN: [id number]  HPI 70 year old morbidly obese, wheelchair-bound female with chronic shoulder pain, chronic knee pain as well as chronic low back pain. She states that she has had no real changes in her condition over time. She feels her shoulder pain is a little bit better than it was about a year ago. She does not have any night pain or limitations. When trying to dress herself. She does very limited ambulation approximately or the feet. Perhaps 10 times per day. She uses [place] crutches for this. Otherwise, she stays in a chair or recliner.  Interval medical history positive for shortness of breath. She underwent workup for pulmonary embolism which was negative. She is scheduled to go for cardiac catheterization in a couple weeks.  Pain Inventory Average Pain 7 Pain Right Now 7 My pain is burning and aching  In the last 24 hours, has pain interfered with the following? General activity 8 Relation with others 7 Enjoyment of life 8 What TIME of day is your pain at its worst? NA Sleep (in general) NA  Pain is worse with: walking, bending and standing Pain improves with: rest and medication Relief from Meds: 6  Mobility walk with assistance how many minutes can you walk? 10 ability to climb steps?  no do you drive?  yes use a wheelchair Do you have any goals in this area?  yes  Function disabled: date disabled NA I need assistance with the following:  meal prep, household duties and shopping Do you have any goals in this area?  yes  Neuro/Psych weakness numbness trouble walking depression  Prior Studies Any changes since last visit?  yes x-rays CT/MRI  Physicians involved in your care Any changes since last visit?  no   Family History  Problem Relation Age of Onset  . Cancer Mother     lung and colon  . Stroke Sister    Social History   Social History  . Marital  Status: Married    Spouse Name: N/A  . Number of Children: 1  . Years of Education: N/A   Occupational History  . Diability    Social History Main Topics  . Smoking status: Never Smoker   . Smokeless tobacco: Never Used  . Alcohol Use: No  . Drug Use: No  . Sexual Activity: Not Asked   Other Topics Concern  . None   Social History Narrative   Lives in [place], one adult child      Caffeine use: coffee daily in am   Past Surgical History  Procedure Laterality Date  . Cholecystectomy    . Tubal ligation     Past Medical History  Diagnosis Date  . Hyperlipidemia   . Essential hypertension   . Low back pain   . Morbid obesity ([place])   . Diabetes mellitus type 2 in obese ([place])   . Hypothyroidism   . GERD (gastroesophageal reflux disease)   . Lymphedema   . Lumbosacral spondylosis without myelopathy   . Pain in limb   . Lumbago   . Facet syndrome, lumbar   . Lymphedema   . Primary localized osteoarthrosis, lower leg   . CAD (coronary artery disease)     a. Lexiscan 05/12/16: smal defect of moderate severity in the mid anterior and apical location opartially reversible at rest. EF 42%. TID ratio uincreased at 1.23 possibly c/w balanced ischemia. Frequent PVCs  BP 135/80 mmHg  Pulse 93  Resp 17  SpO2 82%  Opioid Risk Score:   Fall Risk Score:  `1  Depression screen PHQ 2/9  Depression screen [facility] 2/9 04/20/2016 01/26/2016 11/24/2015 07/16/2015  Decreased Interest 0 2 0 0  Down, Depressed, Hopeless 0 1 1 1   PHQ - 2 Score 0 3 1 1   Altered sleeping - 3 - 1  Tired, decreased energy - 3 - 1  Change in appetite - 2 - 1  Feeling bad or failure about yourself  - 2 - 1  Trouble concentrating - 0 - 0  Moving slowly or fidgety/restless - 0 - 0  Suicidal thoughts - 0 - 0  PHQ-9 Score - 13 - 5  Difficult doing work/chores - Somewhat difficult - Somewhat difficult     Review of Systems  Musculoskeletal: Positive for gait problem.  Neurological: Positive for  weakness and numbness.  Psychiatric/Behavioral: Positive for dysphoric mood.  All other systems reviewed and are negative.      Objective:   Physical Exam  Constitutional: She is oriented to person, place, and time. She appears well-developed and well-nourished.  HENT:  Head: Normocephalic and atraumatic.  Eyes: Conjunctivae and EOM are normal. Pupils are equal, round, and reactive to light.  Neck: Normal range of motion.  Musculoskeletal:       Right knee: She exhibits decreased range of motion. No tenderness found.       Left knee: She exhibits decreased range of motion. No tenderness found.  Neurological: She is alert and oriented to person, place, and time.  Psychiatric: She has a normal mood and affect.  Nursing note and vitals reviewed.  Cervical range of motion is normal. She does have some pain with neck range of motion at the base of the cervical spine. Upper extremity strength 5/5 in the deltoid, biceps, triceps, grip Upper extremity range of motion is normal. Negative impingement sign Lower extremity strength difficult to assess secondary to range of motion restrictions due to massive lymph edema in the lower extremities. She has panniculitis behind each knee. She also has abdominal panniculus. She does have ankle dorsiflexion, plantar flexion. She can raise her heel off the wheelchair paddle.       Assessment & Plan:  1. Chronic low back pain, degenerative disc disease 2. Chronic end-stage osteoarthritis of the knee on the right and moderate degenerative changes on the left 3. Subacromial impingement syndrome as well as glenohumeral osteoarthritis, right greater than left shoulder. Line. 4. Chronic pain syndrome on chronic narcotic analgesics. No sign of opiate misuse. No side effects from medication such as constipation or sedation. No falls.  Continue opioid monitoring program. This consists of regular clinic visits, examinations, urine drug screen, pill counts as  well as use of [place] controlled substance reporting System. Last UDS 01/26/2016 was consistent  We discussed that the patient's total narcotic doses over 100 mg equivalence of morphine. If she feels she has inadequate pain relief, non-pharmaceutical treatment option should be explored, she is currently happy with her medication regimen  Morphine sulfate, extended release 30 mg twice a day Hydrocodone 10 mg 3 times per day

## 2016-05-23 ENCOUNTER — Other Ambulatory Visit: Payer: Self-pay | Admitting: Internal Medicine

## 2016-05-23 ENCOUNTER — Other Ambulatory Visit: Payer: Self-pay

## 2016-05-23 ENCOUNTER — Ambulatory Visit (INDEPENDENT_AMBULATORY_CARE_PROVIDER_SITE_OTHER): Payer: Medicare Other

## 2016-05-23 DIAGNOSIS — R0602 Shortness of breath: Secondary | ICD-10-CM

## 2016-05-23 DIAGNOSIS — R079 Chest pain, unspecified: Secondary | ICD-10-CM | POA: Diagnosis not present

## 2016-05-24 ENCOUNTER — Encounter: Payer: Self-pay | Admitting: *Deleted

## 2016-05-24 ENCOUNTER — Telehealth: Payer: Self-pay | Admitting: Cardiology

## 2016-05-24 DIAGNOSIS — Z01812 Encounter for preprocedural laboratory examination: Secondary | ICD-10-CM

## 2016-05-24 NOTE — Telephone Encounter (Signed)
Called patient regarding echo results and she states that she did not have any pre operative instructions for her procedure. Reviewed instructions with her and let her know that she received a print out with all instructions listed for her catheterization when she saw Christell Faith PA on 05/17/16. Reviewed those instructions in detail with her to include her arrival time of 7:30AM. Instructed her to go to hospital to have her labs done prior to her procedure. Also let her know that we did not need a chest xray per Dr. Fletcher Anon and Christell Faith PA since she recently had a CT done. She verbalized understanding of all instructions and had no further questions at this time. I changed the lab orders to be done at the hospital and canceled the xray that was ordered.

## 2016-05-27 ENCOUNTER — Other Ambulatory Visit
Admission: RE | Admit: 2016-05-27 | Discharge: 2016-05-27 | Disposition: A | Discharge status: Home / Self Care | Payer: Medicare Other | Source: Ambulatory Visit | Attending: Cardiology | Admitting: Cardiology

## 2016-05-27 DIAGNOSIS — Z6841 Body Mass Index (BMI) 40.0 and over, adult: Secondary | ICD-10-CM | POA: Insufficient documentation

## 2016-05-27 DIAGNOSIS — Z01812 Encounter for preprocedural laboratory examination: Secondary | ICD-10-CM | POA: Diagnosis not present

## 2016-05-27 DIAGNOSIS — I10 Essential (primary) hypertension: Secondary | ICD-10-CM | POA: Diagnosis not present

## 2016-05-27 DIAGNOSIS — R0602 Shortness of breath: Secondary | ICD-10-CM | POA: Diagnosis not present

## 2016-05-27 DIAGNOSIS — Z794 Long term (current) use of insulin: Secondary | ICD-10-CM | POA: Diagnosis not present

## 2016-05-27 DIAGNOSIS — R9439 Abnormal result of other cardiovascular function study: Secondary | ICD-10-CM | POA: Diagnosis not present

## 2016-05-27 DIAGNOSIS — E119 Type 2 diabetes mellitus without complications: Secondary | ICD-10-CM | POA: Insufficient documentation

## 2016-05-27 DIAGNOSIS — Z79899 Other long term (current) drug therapy: Secondary | ICD-10-CM | POA: Diagnosis not present

## 2016-05-27 DIAGNOSIS — E785 Hyperlipidemia, unspecified: Secondary | ICD-10-CM | POA: Insufficient documentation

## 2016-05-27 LAB — PROTIME-INR
INR: 0.98
Prothrombin Time: 13 seconds (ref 11.4–15.2)

## 2016-05-27 LAB — BASIC METABOLIC PANEL
ANION GAP: 7 (ref 5–15)
BUN: 15 mg/dL (ref 6–20)
CHLORIDE: 100 mmol/L — AB (ref 101–111)
CO2: 32 mmol/L (ref 22–32)
CREATININE: 0.94 mg/dL (ref 0.44–1.00)
Calcium: 9 mg/dL (ref 8.9–10.3)
GFR calc non Af Amer: >60 mL/min (ref >60)
Glucose, Bld: 133 mg/dL — ABNORMAL HIGH (ref 65–99)
POTASSIUM: 4.3 mmol/L (ref 3.5–5.1)
SODIUM: 139 mmol/L (ref 135–145)

## 2016-05-27 LAB — CBC
HCT: 39.3 % (ref 35.0–47.0)
HEMOGLOBIN: 13.1 g/dL (ref 12.0–16.0)
MCH: 28.1 pg (ref 26.0–34.0)
MCHC: 33.4 g/dL (ref 32.0–36.0)
MCV: 84.1 fL (ref 80.0–100.0)
PLATELETS: 253 10*3/uL (ref 150–440)
RBC: 4.67 MIL/uL (ref 3.80–5.20)
RDW: 15.8 % — ABNORMAL HIGH (ref 11.5–14.5)
WBC: 7.5 10*3/uL (ref 3.6–11.0)

## 2016-06-01 ENCOUNTER — Telehealth: Payer: Self-pay | Admitting: Cardiology

## 2016-06-01 ENCOUNTER — Other Ambulatory Visit: Payer: Self-pay | Admitting: *Deleted

## 2016-06-01 MED ORDER — MORPHINE SULFATE ER 30 MG PO TBCR: 30.0000 mg | EXTENDED_RELEASE_TABLET | Freq: Two times a day (BID) | ORAL | 0 refills | Status: DC

## 2016-06-01 MED ORDER — HYDROCODONE-ACETAMINOPHEN 10-325 MG PO TABS: 1.0000 | ORAL_TABLET | Freq: Three times a day (TID) | ORAL | 0 refills | Status: DC

## 2016-06-01 NOTE — Telephone Encounter (Signed)
Reviewed cath instructions w/pt who verbalized understanding. States last dose of metformin was 7/31. Pt agreeable w/plan w/no questions at this time.

## 2016-06-01 NOTE — Telephone Encounter (Signed)
Rx printed for Eunice to sign 

## 2016-06-02 ENCOUNTER — Ambulatory Visit
Admission: RE | Admit: 2016-06-02 | Discharge: 2016-06-02 | Disposition: A | Discharge status: Home / Self Care | Payer: Medicare Other | Source: Ambulatory Visit | Attending: Cardiovascular Disease | Admitting: Cardiovascular Disease

## 2016-06-02 ENCOUNTER — Encounter: Payer: Self-pay | Admitting: *Deleted

## 2016-06-02 ENCOUNTER — Encounter
Admission: RE | Disposition: A | Discharge status: Home / Self Care | Payer: Self-pay | Source: Ambulatory Visit | Attending: Cardiovascular Disease

## 2016-06-02 DIAGNOSIS — I890 Lymphedema, not elsewhere classified: Secondary | ICD-10-CM | POA: Insufficient documentation

## 2016-06-02 DIAGNOSIS — E119 Type 2 diabetes mellitus without complications: Secondary | ICD-10-CM | POA: Diagnosis not present

## 2016-06-02 DIAGNOSIS — Z9049 Acquired absence of other specified parts of digestive tract: Secondary | ICD-10-CM | POA: Insufficient documentation

## 2016-06-02 DIAGNOSIS — I25119 Atherosclerotic heart disease of native coronary artery with unspecified angina pectoris: Secondary | ICD-10-CM | POA: Diagnosis not present

## 2016-06-02 DIAGNOSIS — R531 Weakness: Secondary | ICD-10-CM | POA: Diagnosis not present

## 2016-06-02 DIAGNOSIS — E039 Hypothyroidism, unspecified: Secondary | ICD-10-CM | POA: Diagnosis not present

## 2016-06-02 DIAGNOSIS — Z79899 Other long term (current) drug therapy: Secondary | ICD-10-CM | POA: Insufficient documentation

## 2016-06-02 DIAGNOSIS — G8929 Other chronic pain: Secondary | ICD-10-CM | POA: Insufficient documentation

## 2016-06-02 DIAGNOSIS — Z7982 Long term (current) use of aspirin: Secondary | ICD-10-CM | POA: Diagnosis not present

## 2016-06-02 DIAGNOSIS — R5381 Other malaise: Secondary | ICD-10-CM | POA: Insufficient documentation

## 2016-06-02 DIAGNOSIS — R0602 Shortness of breath: Secondary | ICD-10-CM | POA: Insufficient documentation

## 2016-06-02 DIAGNOSIS — K219 Gastro-esophageal reflux disease without esophagitis: Secondary | ICD-10-CM | POA: Insufficient documentation

## 2016-06-02 DIAGNOSIS — M479 Spondylosis, unspecified: Secondary | ICD-10-CM | POA: Diagnosis not present

## 2016-06-02 DIAGNOSIS — Z794 Long term (current) use of insulin: Secondary | ICD-10-CM | POA: Insufficient documentation

## 2016-06-02 DIAGNOSIS — R931 Abnormal findings on diagnostic imaging of heart and coronary circulation: Secondary | ICD-10-CM

## 2016-06-02 DIAGNOSIS — R9439 Abnormal result of other cardiovascular function study: Secondary | ICD-10-CM | POA: Diagnosis not present

## 2016-06-02 DIAGNOSIS — E785 Hyperlipidemia, unspecified: Secondary | ICD-10-CM | POA: Diagnosis not present

## 2016-06-02 DIAGNOSIS — I493 Ventricular premature depolarization: Secondary | ICD-10-CM | POA: Diagnosis not present

## 2016-06-02 DIAGNOSIS — I10 Essential (primary) hypertension: Secondary | ICD-10-CM | POA: Diagnosis not present

## 2016-06-02 DIAGNOSIS — M549 Dorsalgia, unspecified: Secondary | ICD-10-CM | POA: Diagnosis not present

## 2016-06-02 DIAGNOSIS — M1991 Primary osteoarthritis, unspecified site: Secondary | ICD-10-CM | POA: Diagnosis not present

## 2016-06-02 DIAGNOSIS — I208 Other forms of angina pectoris: Secondary | ICD-10-CM | POA: Diagnosis not present

## 2016-06-02 DIAGNOSIS — Z6841 Body Mass Index (BMI) 40.0 and over, adult: Secondary | ICD-10-CM | POA: Insufficient documentation

## 2016-06-02 DIAGNOSIS — Z881 Allergy status to other antibiotic agents status: Secondary | ICD-10-CM | POA: Insufficient documentation

## 2016-06-02 DIAGNOSIS — I2089 Other forms of angina pectoris: Secondary | ICD-10-CM

## 2016-06-02 DIAGNOSIS — Z888 Allergy status to other drugs, medicaments and biological substances status: Secondary | ICD-10-CM | POA: Insufficient documentation

## 2016-06-02 HISTORY — PX: CARDIAC CATHETERIZATION: SHX172

## 2016-06-02 SURGERY — LEFT HEART CATH AND CORONARY ANGIOGRAPHY
Anesthesia: Moderate Sedation

## 2016-06-02 MED ORDER — SODIUM CHLORIDE 0.9 % IV SOLN
250.0000 mL | INTRAVENOUS | Status: DC | PRN
  Administered 2016-06-02: 1000 mL via INTRAVENOUS
  Administered 2016-06-02: 250 mL via INTRAVENOUS

## 2016-06-02 MED ORDER — VERAPAMIL HCL 2.5 MG/ML IV SOLN
INTRAVENOUS | Status: DC | PRN
  Administered 2016-06-02: 2.5 mg via INTRA_ARTERIAL

## 2016-06-02 MED ORDER — HEPARIN SODIUM (PORCINE) 1000 UNIT/ML IJ SOLN
INTRAMUSCULAR | Status: AC
  Filled 2016-06-02: qty 1

## 2016-06-02 MED ORDER — SODIUM CHLORIDE 0.9 % WEIGHT BASED INFUSION: 3.0000 mL/kg/h | INTRAVENOUS | Status: AC

## 2016-06-02 MED ORDER — SODIUM CHLORIDE 0.9 % IV SOLN: INTRAVENOUS | Status: DC

## 2016-06-02 MED ORDER — FENTANYL CITRATE (PF) 100 MCG/2ML IJ SOLN
INTRAMUSCULAR | Status: DC | PRN
  Administered 2016-06-02: 50 ug via INTRAVENOUS

## 2016-06-02 MED ORDER — SODIUM CHLORIDE 0.9% FLUSH: 3.0000 mL | INTRAVENOUS | Status: DC | PRN

## 2016-06-02 MED ORDER — SODIUM CHLORIDE 0.9% FLUSH: 3.0000 mL | Freq: Two times a day (BID) | INTRAVENOUS | Status: DC

## 2016-06-02 MED ORDER — HEPARIN SODIUM (PORCINE) 1000 UNIT/ML IJ SOLN
INTRAMUSCULAR | Status: DC | PRN
  Administered 2016-06-02: 6000 [IU] via INTRAVENOUS

## 2016-06-02 MED ORDER — HEPARIN (PORCINE) IN NACL 2-0.9 UNIT/ML-% IJ SOLN
INTRAMUSCULAR | Status: AC
  Filled 2016-06-02: qty 500

## 2016-06-02 MED ORDER — MIDAZOLAM HCL 2 MG/2ML IJ SOLN
INTRAMUSCULAR | Status: DC | PRN
  Administered 2016-06-02: 1 mg via INTRAVENOUS

## 2016-06-02 MED ORDER — FENTANYL CITRATE (PF) 100 MCG/2ML IJ SOLN
INTRAMUSCULAR | Status: AC
  Filled 2016-06-02: qty 2

## 2016-06-02 MED ORDER — VERAPAMIL HCL 2.5 MG/ML IV SOLN
INTRAVENOUS | Status: AC
  Filled 2016-06-02: qty 2

## 2016-06-02 MED ORDER — ASPIRIN 81 MG PO CHEW: 81.0000 mg | CHEWABLE_TABLET | ORAL | Status: DC

## 2016-06-02 MED ORDER — SODIUM CHLORIDE 0.9 % WEIGHT BASED INFUSION: 1.0000 mL/kg/h | INTRAVENOUS | Status: DC

## 2016-06-02 MED ORDER — MIDAZOLAM HCL 2 MG/2ML IJ SOLN
INTRAMUSCULAR | Status: AC
  Filled 2016-06-02: qty 2

## 2016-06-02 MED ORDER — IOPAMIDOL (ISOVUE-300) INJECTION 61%
INTRAVENOUS | Status: DC | PRN
  Administered 2016-06-02: 90 mL via INTRA_ARTERIAL

## 2016-06-02 SURGICAL SUPPLY — 10 items
CATH INFINITI 5 FR JL3.5 (CATHETERS) ×1 IMPLANT
CATH INFINITI 5FR ANG PIGTAIL (CATHETERS) ×1 IMPLANT
CATH OPTITORQUE JACKY 4.0 5F (CATHETERS) ×1 IMPLANT
DEVICE RAD COMP TR BAND LRG (VASCULAR PRODUCTS) ×1 IMPLANT
DEVICE RAD TR BAND REGULAR (VASCULAR PRODUCTS) IMPLANT
GLIDESHEATH SLEND SS 6F .021 (SHEATH) ×1 IMPLANT
KIT MANI 3VAL PERCEP (MISCELLANEOUS) ×2 IMPLANT
PACK CARDIAC CATH (CUSTOM PROCEDURE TRAY) ×2 IMPLANT
WIRE HITORQ VERSACORE ST 145CM (WIRE) ×1 IMPLANT
WIRE SAFE-T 1.5MM-J .035X260CM (WIRE) ×1 IMPLANT

## 2016-06-02 NOTE — Discharge Instructions (Signed)
Groin Insertion Instructions-If you lose feeling or develop tingling or pain in your leg or foot after the procedure, please walk around first.  If the discomfort does not improve , contact your physician and proceed to the nearest emergency room.  Loss of feeling in your leg might mean that a blockage has formed in the artery and this can be appropriately treated.  Limit your activity for the next two days after your procedure.  Avoid stooping, bending, heavy lifting or exertion as this may put pressure on the insertion site.  Resume normal activities in 48 hours.  You may shower after 24 hours but avoid excessive warm water and do not scrub the site.  Remove clear dressing in 48 hours.  If you have had a closure device inserted, do not soak in a tub bath or a hot tub for at least one week.  No driving for 48 hours after discharge.  After the procedure, check the insertion site occasionally.  If any oozing occurs or there is apparent swelling, firm pressure over the site will prevent a bruise from forming.  You can not hurt anything by pressing directly on the site.  The pressure stops the bleeding by allowing a small clot to form.  If the bleeding continues after the pressure has been applied for more than 15 minutes, call 911 or go to the nearest emergency room.    The x-ray dye causes you to pass a considerate amount of urine.  For this reason, you will be asked to drink plenty of liquids after the procedure to prevent dehydration.  You may resume you regular diet.  Avoid caffeine products.    For pain at the site of your procedure, take non-aspirin medicines such as Tylenol.  Medications: A. Hold Metformin for 48 hours if applicable.  B. Continue taking all your present medications at home unless your doctor prescribes any changes.  monitor right radial, if begins to bleed place to fingers of left hand to area holding for 10 minutes, if still bleeding come to er, or if discolored, or no feeling, come  to er to seek attention

## 2016-06-02 NOTE — Progress Notes (Signed)
Pt clinically stable post heart cath, although baseline pre cath o2 sats @ 84-88, pt stating not on cpap at home nor oxygen, no apparent distress. Dr Fletcher Anon out to speak with patient and husband with questions answered, ok for pt to be discharged home since this has been apparent ongoing issue, he will contact her physician to have further oxygen workup done, as he has told pt and husband, pt presently resting with resp's easy, no visible distress at all, no sob, no cp noted.

## 2016-06-02 NOTE — Interval H&P Note (Signed)
Cath Lab Visit (complete for each Cath Lab visit)  Clinical Evaluation Leading to the Procedure:   ACS: No.  Non-ACS:    Anginal Classification: CCS III  Anti-ischemic medical therapy: Minimal Therapy (1 class of medications)  Non-Invasive Test Results: High-risk stress test findings: cardiac mortality >3%/year  Prior CABG: No previous CABG      History and Physical Interval Note:  06/02/2016 8:56 AM  Theresa Huffman  has presented today for surgery, with the diagnosis of abnormal stress test/SOB  The various methods of treatment have been discussed with the patient and family. After consideration of risks, benefits and other options for treatment, the patient has consented to  Procedure(s): Left Heart Cath and Coronary Angiography (N/A) as a surgical intervention .  The patient's history has been reviewed, patient examined, no change in status, stable for surgery.  I have reviewed the patient's chart and labs.  Questions were answered to the patient's satisfaction.     Kathlyn [place]

## 2016-06-02 NOTE — H&P (View-Only) (Signed)
Cardiology Office Note Date:  05/17/2016  Patient ID:  Theresa, Huffman [birth year], MRN [id number] PCP:  Arnette Norris, MD  Cardiologist:  Dr. Yvone Neu, MD    Chief Complaint: Follow up of abnormal stress test  History of Present Illness: Theresa Huffman is a 70 y.o. female with history of HTN, HLD, morbid obesity, lymphedema, hypothyroidism, DM2, and chronic back pain who was recently seen by Dr. Yvone Neu, MD on 05/04/16 for increased SOB. At that time she reported increased SOB for at least the prior 8 months. Prior to her increased SOB she was able to ambulate with her crutches with mild SOB. However, over the prior 8 months her SOb has been progressively worsening to the point of moderate-severe intensity with ambulation in her house. She also mentioned some centralized chest pain that occured with exeriton. She underwent Lexiscan Myoview on 05/12/16 that showed a small defect of moderate severity in the mid anterior and apical location that was partially reversible at rest. Could not rule out ischemia. EF 42%. TID ratio was increased at 1.23 possibly consistent with balanced ischemia. Frequent PVCs were noted. She was notified of her results on 05/12/16. She comes in today to discuss LHC with Dr. Fletcher Anon, MD.   She continues to have SOB that has worsened over the past 1 month. No chest pain, palpitations, nausea, vomiting, or diaphoresis. Her SOB is only associated with exertion (walking to the restroom at home) at this time. When she is at rest or using her wheelchair she is asymptomatic. She is currently without SOB in her wheelchair.    Past Medical History  Diagnosis Date  . Hyperlipidemia   . Essential hypertension   . Low back pain   . Morbid obesity ([place])   . Diabetes mellitus type 2 in obese ([place])   . Hypothyroidism   . GERD (gastroesophageal reflux disease)   . Lymphedema   . Lumbosacral spondylosis without myelopathy   . Pain in limb   . Lumbago   . Facet syndrome, lumbar     . Lymphedema   . Primary localized osteoarthrosis, lower leg   . CAD (coronary artery disease)     a. Lexiscan 05/12/16: smal defect of moderate severity in the mid anterior and apical location opartially reversible at rest. EF 42%. TID ratio uincreased at 1.23 possibly c/w balanced ischemia. Frequent PVCs    Past Surgical History  Procedure Laterality Date  . Cholecystectomy    . Tubal ligation      Current Outpatient Prescriptions  Medication Sig Dispense Refill  . albuterol (PROAIR HFA) 108 (90 BASE) MCG/ACT inhaler Inhale 2 puffs into the lungs every 4 (four) hours as needed. 1 Inhaler 1  . amLODipine (NORVASC) 5 MG tablet TAKE 1 TABLET BY MOUTH DAILY 90 tablet 0  . aspirin EC 81 MG tablet Take 162 mg by mouth daily.    . baclofen (LIORESAL) 10 MG tablet TAKE 1 TABLET BY MOUTH THREE TIMES A DAY 90 each 2  . Calcium Carbonate-Vitamin D 600-400 MG-UNIT per tablet Take 1 tablet by mouth daily. 1200mg  once daily    . furosemide (LASIX) 40 MG tablet TAKE 1 TABLET BY MOUTH TWICE A DAY 180 tablet 0  . gabapentin (NEURONTIN) 300 MG capsule TAKE TWO CAPSULES BY MOUTH THREE TIMES DAILY 180 capsule 2  . glipiZIDE (GLUCOTROL) 10 MG tablet TAKE 1 TABLET BY MOUTH TWICE A DAY BEFORE A MEAL 60 tablet 0  . HYDROcodone-acetaminophen (NORCO) 10-325 MG tablet Take 1 tablet  by mouth 3 (three) times daily. As needed for back or leg pain 90 tablet 0  . ibuprofen (ADVIL,MOTRIN) 800 MG tablet TAKE 1 TABLET BY MOUTH 3 TIMES DAILY 90 tablet 3  . Insulin NPH, Human,, Isophane, (HUMULIN N) 100 UNIT/ML Kiwkpen Inject units 20 units before breakfast and 15 units before bedtime (Patient taking differently: Inject units 20 units before breakfast and 25 units before bedtime) 15 mL 2  . levothyroxine (SYNTHROID) 200 MCG tablet Take 1 tablet (200 mcg total) by mouth daily before breakfast. 90 tablet 1  . metFORMIN (GLUCOPHAGE) 1000 MG tablet TAKE 1 TABLET BY MOUTH TWICE A DAY 60 tablet 0  . morphine (MS CONTIN) 30 MG 12  hr tablet Take 1 tablet (30 mg total) by mouth every 12 (twelve) hours. 60 tablet 0  . potassium chloride SA (K-DUR,KLOR-CON) 20 MEQ tablet TAKE 1 TABLET BY MOUTH DAILY 90 tablet 2  . simvastatin (ZOCOR) 20 MG tablet Take 1 tablet (20 mg total) by mouth daily. LABS REQUIRED FOR ADDITIONAL REFILLS 30 tablet 0  . venlafaxine (EFFEXOR) 75 MG tablet Take three tablets by mouth every night at bedtime 90 tablet 3   No current facility-administered medications for this visit.    Allergies:   Ace inhibitors; Erythromycin; and Invokana   Social History:  The patient  reports that she has never smoked. She has never used smokeless tobacco. She reports that she does not drink alcohol or use illicit drugs.   Family History:  The patient's family history includes Cancer in her mother; Stroke in her sister.  ROS:   Review of Systems  Constitutional: Positive for malaise/fatigue. Negative for fever, chills, weight loss and diaphoresis.  HENT: Negative for congestion.   Eyes: Negative for discharge and redness.  Respiratory: Positive for shortness of breath. Negative for cough, sputum production and wheezing.   Cardiovascular: Negative for chest pain, palpitations, orthopnea, claudication, leg swelling and PND.  Gastrointestinal: Negative for heartburn, nausea, vomiting and abdominal pain.  Musculoskeletal: Negative for myalgias and falls.  Skin: Negative for rash.  Neurological: Positive for weakness. Negative for dizziness, tingling, tremors, sensory change, speech change, focal weakness and loss of consciousness.  Endo/Heme/Allergies: Does not bruise/bleed easily.  Psychiatric/Behavioral: Negative for substance abuse. The patient is not nervous/anxious.   All other systems reviewed and are negative.    PHYSICAL EXAM:  VS:  BP 145/72 mmHg  Pulse 93  Ht 5\' 4"  (1.626 m)  Wt 372 lb (168.738 kg)  BMI 63.82 kg/m2 BMI: Body mass index is 63.82 kg/(m^2).  Physical Exam  Constitutional: She is  oriented to person, place, and time. She appears well-developed and well-nourished.  HENT:  Head: Normocephalic and atraumatic.  Eyes: Right eye exhibits no discharge. Left eye exhibits no discharge.  Neck: Normal range of motion. No JVD present.  Cardiovascular: Normal rate, regular rhythm, S1 normal, S2 normal and normal heart sounds.  Exam reveals no distant heart sounds, no friction rub, no midsystolic click and no opening snap.   No murmur heard. Pulses:      Radial pulses are 2+ on the right side, and 2+ on the left side.  Pulmonary/Chest: Effort normal and breath sounds normal. No respiratory distress. She has no decreased breath sounds. She has no wheezes. She has no rales. She exhibits no tenderness.  Abdominal: Soft. She exhibits no distension. There is no tenderness.  Musculoskeletal: She exhibits edema.  Neurological: She is alert and oriented to person, place, and time.  Skin: Skin is warm  and dry. No cyanosis. Nails show no clubbing.  Psychiatric: She has a normal mood and affect. Her speech is normal and behavior is normal. Judgment and thought content normal.     EKG:  Was ordered and interpreted by me today. Shows NSR, 93 bpm, no acute st/t changes   Recent Labs: 04/13/2016: ALT 18; BUN 21; Creatinine, Ser 0.92; Hemoglobin 13.0; Platelets 308.0; Potassium 4.3; Sodium 140; TSH 2.32  No results found for requested labs within last 365 days.   CrCl cannot be calculated (Patient has no serum creatinine result on file.).   Wt Readings from Last 3 Encounters:  05/17/16 372 lb (168.738 kg)  05/04/16 382 lb 8 oz (173.501 kg)  04/13/16 382 lb 8 oz (173.501 kg)     Other studies reviewed: Additional studies/records reviewed today include: summarized above  ASSESSMENT AND PLAN:  1. Abnormal nuclear stress test/SOB/angina: Still notes SOB. Likely multifactorial including possible ischemia based on her abnormal stress test, super obesity, and possible OHS. Schedule for LHC  with Dr. Fletcher Anon, MD on 06/02/16, first case. Risks and benefits of cardiac catheterization have been discussed with the patient including risks of bleeding, bruising, infection, kidney damage, stroke, heart attack, and death. The patient understands these risks and is willing to proceed with the procedure. All questions have been answered and concerns listened to. Continue aspirin 81 mg daily.   2. HTN: Well controlled. Continue current medications.   3. HLD: Simvastatin.  4. IDDM: Continue insulin per PCP. Hold metformin pre-cath and post cath for 48 hours. Would ideally need to be an early case for cardiac cath given her insulin requirement.   5. Lymphedema: Stable. No longer wearing ACE wraps. She does not want to wear them any longer.   6. Super obesity/possibel OHS: She would benefit from cardiac rehab s/p cardiac catheterization.   Disposition: F/u with Dr. Yvone Neu, MD after cardiac cath  Current medicines are reviewed at length with the patient today.  The patient did not have any concerns regarding medicines.  Melvern Banker PA-C 05/17/2016 3:26 PM     [address] [place], [place] [address] [phone]

## 2016-06-02 NOTE — Progress Notes (Signed)
Pt remains clinically stable post heart cath, husband present, very supportive of wife, helping with her care, helped get ready for discharge,no complaints, right radial without bleeding nor hematoma, drsg dry and intact,resp's without distress. Up to w/c to void prior to discharge.

## 2016-06-08 ENCOUNTER — Encounter: Payer: Self-pay | Admitting: Cardiology

## 2016-06-08 ENCOUNTER — Ambulatory Visit (INDEPENDENT_AMBULATORY_CARE_PROVIDER_SITE_OTHER): Payer: Medicare Other | Admitting: Cardiology

## 2016-06-08 VITALS — BP 127/70 | HR 76 | Ht 64.0 in | Wt 372.0 lb

## 2016-06-08 DIAGNOSIS — I2510 Atherosclerotic heart disease of native coronary artery without angina pectoris: Secondary | ICD-10-CM | POA: Diagnosis not present

## 2016-06-08 DIAGNOSIS — E669 Obesity, unspecified: Secondary | ICD-10-CM | POA: Diagnosis not present

## 2016-06-08 DIAGNOSIS — E785 Hyperlipidemia, unspecified: Secondary | ICD-10-CM

## 2016-06-08 DIAGNOSIS — I10 Essential (primary) hypertension: Secondary | ICD-10-CM

## 2016-06-08 NOTE — Patient Instructions (Signed)
Follow-Up: Your physician wants you to follow-up in: 1 year with Dr. Yvone Neu. You will receive a reminder letter in the mail two months in advance. If you don't receive a letter, please call our office to schedule the follow-up appointment.  It was a pleasure seeing you today here in the office. Please do not hesitate to give [place] a call back if you have any further questions. [place], BSN

## 2016-06-08 NOTE — Progress Notes (Signed)
Cardiology Office Note   Date:  06/08/2016   ID:  Theresa Huffman, DOB 1946/09/19, MRN [id number]  Referring Doctor:  Arnette Norris, MD   Cardiologist:   Wende Bushy, MD   Reason for consultation:  Chief Complaint  Patient presents with  . Other    F/u cardiac cath, echo and myoview. Meds reviewed verbally with pt.      History of Present Illness: Theresa Huffman is a 70 y.o. female who presents for Follow-up after heart catheterization   She presented for evaluation of shortness of breath. Because of risk factors, a CTA of the chest was ordered to rule out PE. There was no evidence of any PE and therefore she proceeded with a stress test. The stress this turned out to be abnormal. With the proceeded with a definitive test of left heart catheterization. She was found to have nonobstructive coronary artery disease.  She continues to have shortness of breath the same as before. No chest pain, lightheadedness or passing out.  ROS:  Please see the history of present illness. Aside from mentioned under HPI, all other systems are reviewed and negative.     Past Medical History:  Diagnosis Date  . CAD (coronary artery disease)    a. Lexiscan 05/12/16: smal defect of moderate severity in the mid anterior and apical location opartially reversible at rest. EF 42%. TID ratio uincreased at 1.23 possibly c/w balanced ischemia. Frequent PVCs  . Diabetes mellitus type 2 in obese ([place])   . Essential hypertension   . Facet syndrome, lumbar   . GERD (gastroesophageal reflux disease)   . Hyperlipidemia   . Hypothyroidism   . Low back pain   . Lumbago   . Lumbosacral spondylosis without myelopathy   . Lymphedema   . Lymphedema   . Morbid obesity ([place])   . Pain in limb   . Primary localized osteoarthrosis, lower leg     Past Surgical History:  Procedure Laterality Date  . CARDIAC CATHETERIZATION N/A 06/02/2016   Procedure: Left Heart Cath and Coronary Angiography;  Surgeon:  [name], MD;  Location: [facility];  Service: Cardiovascular;  Laterality: N/A;  . CHOLECYSTECTOMY    . TUBAL LIGATION       reports that she has never smoked. She has never used smokeless tobacco. She reports that she does not drink alcohol or use drugs.   family history includes Cancer in her mother; Stroke in her sister.   Outpatient Medications Prior to Visit  Medication Sig Dispense Refill  . albuterol (PROAIR HFA) 108 (90 BASE) MCG/ACT inhaler Inhale 2 puffs into the lungs every 4 (four) hours as needed. (Patient taking differently: Inhale 2 puffs into the lungs every 4 (four) hours as needed for wheezing. ) 1 Inhaler 1  . amLODipine (NORVASC) 5 MG tablet TAKE 1 TABLET BY MOUTH DAILY (Patient taking differently: TAKE 5 MG BY MOUTH DAILY) 90 tablet 0  . aspirin EC 81 MG tablet Take 81 mg by mouth daily.     . baclofen (LIORESAL) 10 MG tablet TAKE 1 TABLET BY MOUTH THREE TIMES A DAY (Patient taking differently: TAKE 10 MG BY MOUTH THREE TIMES A DAY) 90 each 2  . Calcium Carbonate-Vitamin D 600-400 MG-UNIT per tablet Take 1 tablet by mouth daily. 1200mg  once daily    . furosemide (LASIX) 40 MG tablet TAKE 1 TABLET BY MOUTH TWICE A DAY (Patient taking differently: TAKE 40 MG BY MOUTH DAILY) 180 tablet 0  .  gabapentin (NEURONTIN) 300 MG capsule TAKE TWO CAPSULES BY MOUTH THREE TIMES DAILY (Patient taking differently: TAKE 600 mg BY MOUTH TWICE TIMES DAILY) 180 capsule 2  . glipiZIDE (GLUCOTROL) 10 MG tablet TAKE 1 TABLET BY MOUTH TWICE A DAY BEFORE A MEAL (Patient taking differently: TAKE 10 MG BY MOUTH TWICE A DAY BEFORE A MEAL) 60 tablet 0  . HYDROcodone-acetaminophen (NORCO) 10-325 MG tablet Take 1 tablet by mouth 3 (three) times daily. As needed for back or leg pain 90 tablet 0  . ibuprofen (ADVIL,MOTRIN) 800 MG tablet TAKE 1 TABLET BY MOUTH 3 TIMES DAILY (Patient taking differently: TAKE 800 mg BY MOUTH 3 TIMES DAILY AS NEEDED FOR PAIN) 90 tablet 3  . Insulin NPH,  Human,, Isophane, (HUMULIN N) 100 UNIT/ML Kiwkpen Inject units 20 units before breakfast and 15 units before bedtime (Patient taking differently: Inject units 25 units before breakfast and 20 units before bedtime) 15 mL 2  . levothyroxine (SYNTHROID) 200 MCG tablet Take 1 tablet (200 mcg total) by mouth daily before breakfast. 90 tablet 1  . metFORMIN (GLUCOPHAGE) 1000 MG tablet Take 1 tablet (1,000 mg total) by mouth 2 (two) times daily with a meal. 60 tablet 11  . morphine (MS CONTIN) 30 MG 12 hr tablet Take 1 tablet (30 mg total) by mouth every 12 (twelve) hours. 60 tablet 0  . potassium chloride SA (K-DUR,KLOR-CON) 20 MEQ tablet TAKE 1 TABLET BY MOUTH DAILY (Patient taking differently: TAKE 20 MEQ BY MOUTH DAILY) 90 tablet 2  . simvastatin (ZOCOR) 20 MG tablet Take 1 tablet (20 mg total) by mouth daily. LABS REQUIRED FOR ADDITIONAL REFILLS 30 tablet 0  . venlafaxine (EFFEXOR) 75 MG tablet Take three tablets by mouth every night at bedtime (Patient taking differently: Take 225 mg by mouth at bedtime. ) 90 tablet 3   No facility-administered medications prior to visit.      Allergies: Ace inhibitors; Erythromycin; Invokana [canagliflozin]; and Other    PHYSICAL EXAM: VS:  BP 127/70 (BP Location: Right Wrist, Patient Position: Sitting, Cuff Size: Large)   Pulse 76   Ht 5\' 4"  (1.626 m)   Wt (!) 372 lb (168.7 kg)   BMI 63.85 kg/m  , Body mass index is 63.85 kg/m. Wt Readings from Last 3 Encounters:  06/08/16 (!) 372 lb (168.7 kg)  06/02/16 (!) 375 lb (170.1 kg)  05/17/16 (!) 372 lb (168.7 kg)    GENERAL:  well developed, well nourished, Morbidly obese, not in acute distress HEENT: normocephalic, pink conjunctivae, anicteric sclerae, no xanthelasma, normal dentition, oropharynx clear NECK:  no neck vein engorgement, JVP normal, no hepatojugular reflux, carotid upstroke brisk and symmetric, no bruit, no thyromegaly, no lymphadenopathy LUNGS:  good respiratory effort, clear to auscultation  bilaterally CV:  PMI not displaced, no thrills, no lifts, S1 and S2 within normal limits, no palpable S3 or S4, no murmurs, no rubs, no gallops ABD:  Soft, nontender, nondistended, normoactive bowel sounds, no abdominal aortic bruit, no hepatomegaly, no splenomegaly MS: nontender back, no kyphosis, no scoliosis, no joint deformities EXT:  2+ DP/PT pulses, no edema, (+) lymph edema, no varicosities, no cyanosis, no clubbing, right radial artery +2 no bruit SKIN: warm, nondiaphoretic, normal turgor, no ulcers NEUROPSYCH: alert, oriented to person, place, and time, sensory/motor grossly intact, normal mood, appropriate affect  Recent Labs: 04/13/2016: ALT 18; TSH 2.32 05/27/2016: BUN 15; Creatinine, Ser 0.94; Hemoglobin 13.1; Platelets 253; Potassium 4.3; Sodium 139   Lipid Panel    Component Value Date/Time  CHOL 150 04/23/2015 1340   TRIG 177.0 (H) 04/23/2015 1340   HDL 47.10 04/23/2015 1340   CHOLHDL 3 04/23/2015 1340   VLDL 35.4 04/23/2015 1340   LDLCALC 68 04/23/2015 1340   LDLDIRECT 92.5 08/25/2014 1133     Other studies Reviewed:  EKG:  The ekg from 05/04/2016 was personally reviewed by me and it revealed sinus rhythm, 95 BPM. LVH.  Additional studies/ records that were reviewed personally reviewed by me today include:  Echo 08/28/2012 Left ventricle: The cavity size was normal. Wall thickness was normal. Systolic function was normal. The estimated ejection fraction was in the range of 50% to 55%. Regional wall motion abnormalities cannot be excluded. Doppler parameters are consistent with abnormal left ventricular relaxation (grade 1 diastolic dysfunction).  Nuclear stress is 05/11/2016:  Defect 1: There is a small defect of moderate severity present in the mid anterior and apical anterior location, partially reversible at rest. Cannot rule out ischemia.  Nuclear stress EF: 42%.  TID ratio is increased at 1.23. This may be consistent with balanced ischemia.  Frequent  PVCs noted.  LHC 06/02/2016:  Prox RCA lesion, 20 %stenosed.  The left ventricular systolic function is normal.  LV end diastolic pressure is normal.  The left ventricular ejection fraction is 55-65% by visual estimate.   1. Near normal coronary arteries with only 20% proximal RCA stenosis. 2. Normal LV systolic function with an ejection fraction of 55%. 3. Moderately elevated systemic pressure and moderately elevated left ventricular end-diastolic pressure.   ASSESSMENT AND PLAN:  Nonobstructive CAD Continue medical therapy. RV on aspirin 81 mg by mouth daily, statin therapy. LDL goal is less than 70. The following labs. LVEF is normal on multiple studies. Shortness of breath may be related to morbid obesity or pulmonary issues. Patient to follow-up with PCP. Patient unable to take ACE inhibitor due to angioedema.  Hypertension BP is well controlled. Continue monitoring BP. Continue current medical therapy and lifestyle changes.  Hyperlipidemia LDL goal is < 70, due to diabetes. PCP following labs. Continue current medical therapy and lifestyle changes.  Morbid obesity  Current medicines are reviewed at length with the patient today.  The patient does not have concerns regarding medicines.  Labs/ tests ordered today include:  No orders of the defined types were placed in this encounter.   I had a lengthy and detailed discussion with the patient regarding diagnoses, prognosis, diagnostic options, treatment options , and side effects of medications.   I counseled the patient on importance of lifestyle modification including heart healthy diet, regular physical activity .   Disposition:   FU with undersigned in one year  Signed, Wende Bushy, MD  06/08/2016 2:11 PM    [place]

## 2016-06-13 ENCOUNTER — Other Ambulatory Visit: Payer: Self-pay | Admitting: Family Medicine

## 2016-06-13 ENCOUNTER — Other Ambulatory Visit: Payer: Self-pay | Admitting: Internal Medicine

## 2016-06-15 ENCOUNTER — Ambulatory Visit (INDEPENDENT_AMBULATORY_CARE_PROVIDER_SITE_OTHER): Payer: Medicare Other | Admitting: Family Medicine

## 2016-06-15 ENCOUNTER — Encounter: Payer: Self-pay | Admitting: Family Medicine

## 2016-06-15 VITALS — BP 150/78 | HR 126 | Wt 381.0 lb

## 2016-06-15 DIAGNOSIS — I2510 Atherosclerotic heart disease of native coronary artery without angina pectoris: Secondary | ICD-10-CM

## 2016-06-15 DIAGNOSIS — R0602 Shortness of breath: Secondary | ICD-10-CM

## 2016-06-15 DIAGNOSIS — R0609 Other forms of dyspnea: Secondary | ICD-10-CM

## 2016-06-15 NOTE — Progress Notes (Signed)
Subjective:   Patient ID: Theresa Huffman, female    DOB: 1946-06-23, 70 y.o.   MRN: [id number]  Theresa Huffman is a pleasant 70 y.o. year old morbidly obese female with a h/o poorly controlled, DM, hypothyroidism who presents to clinic today with Breathing Problem on 06/15/2016  HPI:  DOE- progressing for past year. No CP.  O2 sats running low at pain management- often in the mid 80s. Recent negative thorough cardiac work up.  She is asking for referral to pulmonologist.  Lab Results  Component Value Date   WBC 7.5 05/27/2016   HGB 13.1 05/27/2016   HCT 39.3 05/27/2016   MCV 84.1 05/27/2016   PLT 253 05/27/2016       Current Outpatient Prescriptions on File Prior to Visit  Medication Sig Dispense Refill  . albuterol (PROAIR HFA) 108 (90 BASE) MCG/ACT inhaler Inhale 2 puffs into the lungs every 4 (four) hours as needed. (Patient taking differently: Inhale 2 puffs into the lungs every 4 (four) hours as needed for wheezing. ) 1 Inhaler 1  . amLODipine (NORVASC) 5 MG tablet TAKE 1 TABLET BY MOUTH DAILY (Patient taking differently: TAKE 5 MG BY MOUTH DAILY) 90 tablet 0  . aspirin EC 81 MG tablet Take 81 mg by mouth daily.     . baclofen (LIORESAL) 10 MG tablet TAKE 1 TABLET BY MOUTH THREE TIMES A DAY (Patient taking differently: TAKE 10 MG BY MOUTH THREE TIMES A DAY) 90 each 2  . Calcium Carbonate-Vitamin D 600-400 MG-UNIT per tablet Take 1 tablet by mouth daily. 1200mg  once daily    . furosemide (LASIX) 40 MG tablet TAKE 1 TABLET BY MOUTH TWICE A DAY (Patient taking differently: TAKE 40 MG BY MOUTH DAILY) 180 tablet 0  . gabapentin (NEURONTIN) 300 MG capsule TAKE TWO CAPSULES BY MOUTH THREE TIMES DAILY (Patient taking differently: TAKE 600 mg BY MOUTH TWICE TIMES DAILY) 180 capsule 2  . glipiZIDE (GLUCOTROL) 10 MG tablet TAKE 1 TABLET BY MOUTH TWICE A DAY BEFORE A MEAL 60 tablet 0  . HYDROcodone-acetaminophen (NORCO) 10-325 MG tablet Take 1 tablet by mouth 3 (three) times  daily. As needed for back or leg pain 90 tablet 0  . ibuprofen (ADVIL,MOTRIN) 800 MG tablet TAKE 1 TABLET BY MOUTH 3 TIMES DAILY (Patient taking differently: TAKE 800 mg BY MOUTH 3 TIMES DAILY AS NEEDED FOR PAIN) 90 tablet 3  . Insulin NPH, Human,, Isophane, (HUMULIN N) 100 UNIT/ML Kiwkpen Inject units 20 units before breakfast and 15 units before bedtime (Patient taking differently: Inject units 25 units before breakfast and 20 units before bedtime) 15 mL 2  . metFORMIN (GLUCOPHAGE) 1000 MG tablet Take 1 tablet (1,000 mg total) by mouth 2 (two) times daily with a meal. 60 tablet 11  . morphine (MS CONTIN) 30 MG 12 hr tablet Take 1 tablet (30 mg total) by mouth every 12 (twelve) hours. 60 tablet 0  . potassium chloride SA (K-DUR,KLOR-CON) 20 MEQ tablet TAKE 1 TABLET BY MOUTH DAILY (Patient taking differently: TAKE 20 MEQ BY MOUTH DAILY) 90 tablet 2  . simvastatin (ZOCOR) 20 MG tablet Take 1 tablet (20 mg total) by mouth daily. LABS REQUIRED FOR ADDITIONAL REFILLS 30 tablet 0  . SYNTHROID 200 MCG tablet TAKE 1 TABLET BY MOUTH DAILY BEFORE BREAKFAST 30 tablet 0  . venlafaxine (EFFEXOR) 75 MG tablet Take three tablets by mouth every night at bedtime (Patient taking differently: Take 225 mg by mouth at bedtime. ) 90 tablet 3  No current facility-administered medications on file prior to visit.     Allergies  Allergen Reactions  . Ace Inhibitors Other (See Comments)    Angioedema   . Erythromycin Nausea And Vomiting  . Invokana [Canagliflozin] Other (See Comments)    Yeast infection  . Other Rash and Other (See Comments)    Chalky drink    Past Medical History:  Diagnosis Date  . CAD (coronary artery disease)    a. Lexiscan 05/12/16: smal defect of moderate severity in the mid anterior and apical location opartially reversible at rest. EF 42%. TID ratio uincreased at 1.23 possibly c/w balanced ischemia. Frequent PVCs  . Diabetes mellitus type 2 in obese ([place])   . Essential hypertension   .  Facet syndrome, lumbar   . GERD (gastroesophageal reflux disease)   . Hyperlipidemia   . Hypothyroidism   . Low back pain   . Lumbago   . Lumbosacral spondylosis without myelopathy   . Lymphedema   . Lymphedema   . Morbid obesity ([place])   . Pain in limb   . Primary localized osteoarthrosis, lower leg     Past Surgical History:  Procedure Laterality Date  . CARDIAC CATHETERIZATION N/A 06/02/2016   Procedure: Left Heart Cath and Coronary Angiography;  Surgeon: [name], MD;  Location: [facility];  Service: Cardiovascular;  Laterality: N/A;  . CHOLECYSTECTOMY    . TUBAL LIGATION      Family History  Problem Relation Age of Onset  . Cancer Mother     lung and colon  . Stroke Sister     Social History   Social History  . Marital status: Married    Spouse name: N/A  . Number of children: 1  . Years of education: N/A   Occupational History  . Diability Unemployed   Social History Main Topics  . Smoking status: Never Smoker  . Smokeless tobacco: Never Used  . Alcohol use No  . Drug use: No  . Sexual activity: Not on file   Other Topics Concern  . Not on file   Social History Narrative   Lives in [place], one adult child      Caffeine use: coffee daily in am   The PMH, PSH, Social History, Family History, Medications, and allergies have been reviewed in [facility], and have been updated if relevant.    Review of Systems  Constitutional: Negative for chills.  HENT: Negative for congestion, postnasal drip and rhinorrhea.   Respiratory: Positive for shortness of breath. Negative for wheezing.   Cardiovascular: Negative.   Gastrointestinal: Negative for abdominal pain, anal bleeding, blood in stool, constipation, diarrhea, nausea, rectal pain and vomiting.  Endocrine: Negative.   Genitourinary: Negative.   Musculoskeletal: Negative.   Allergic/Immunologic: Negative.   Neurological: Negative.   Hematological: Negative.   All other systems reviewed  and are negative.      Objective:    BP (!) 150/78   Pulse (!) 126   Wt (!) 381 lb (172.8 kg)   SpO2 90%   BMI 65.40 kg/m    Physical Exam  Constitutional: She is oriented to person, place, and time. She appears well-developed and well-nourished. No distress.  Morbidly obese  HENT:  Head: Normocephalic and atraumatic.  Eyes: Conjunctivae are normal.  Neck: Normal range of motion.  Cardiovascular:  Tachycardia, resolved after she has been sitting in room for a few minutes  Pulmonary/Chest: Effort normal. No respiratory distress. She has no wheezes. She has no rales.  She exhibits no tenderness.  Abdominal: She exhibits no distension.  Difficult to assess due to body habitus  Musculoskeletal: Normal range of motion.  Neurological: She is alert and oriented to person, place, and time. No cranial nerve deficit.  Skin: Skin is warm and dry.  Psychiatric: She has a normal mood and affect. Her behavior is normal. Judgment and thought content normal.  Nursing note and vitals reviewed.         Assessment & Plan:   DOE (dyspnea on exertion) - Plan: Ambulatory referral to Pulmonology No Follow-up on file.

## 2016-06-15 NOTE — Progress Notes (Signed)
Pre visit review using our clinic review tool, if applicable. No additional management support is needed unless otherwise documented below in the visit note. 

## 2016-06-15 NOTE — Assessment & Plan Note (Signed)
Progressive and multifactorial.  Her weight is certainly a big contributing factor. Agree she likely has some underlying lung disease- at minimum obesity hypoventilation. Refer to pulmonary per pt request, I agree with referral.+

## 2016-06-15 NOTE — Patient Instructions (Signed)
Good to see you. Please stop by to see Allison on your way out. 

## 2016-06-16 ENCOUNTER — Other Ambulatory Visit: Payer: Self-pay | Admitting: Internal Medicine

## 2016-06-16 ENCOUNTER — Other Ambulatory Visit: Payer: Self-pay | Admitting: Family Medicine

## 2016-06-20 ENCOUNTER — Ambulatory Visit: Payer: Medicare Other | Admitting: Registered Nurse

## 2016-06-21 ENCOUNTER — Encounter: Payer: Self-pay | Admitting: Internal Medicine

## 2016-06-21 ENCOUNTER — Encounter: Payer: Self-pay | Admitting: *Deleted

## 2016-06-21 ENCOUNTER — Ambulatory Visit (INDEPENDENT_AMBULATORY_CARE_PROVIDER_SITE_OTHER): Payer: Medicare Other | Admitting: Internal Medicine

## 2016-06-21 VITALS — BP 132/70 | HR 90 | Ht 64.0 in | Wt 371.0 lb

## 2016-06-21 DIAGNOSIS — J449 Chronic obstructive pulmonary disease, unspecified: Secondary | ICD-10-CM | POA: Insufficient documentation

## 2016-06-21 DIAGNOSIS — G4719 Other hypersomnia: Secondary | ICD-10-CM

## 2016-06-21 DIAGNOSIS — I2510 Atherosclerotic heart disease of native coronary artery without angina pectoris: Secondary | ICD-10-CM

## 2016-06-21 MED ORDER — FLUTICASONE FUROATE-VILANTEROL 200-25 MCG/INH IN AEPB: 1.0000 | INHALATION_SPRAY | Freq: Every day | RESPIRATORY_TRACT | 0 refills | Status: AC

## 2016-06-21 MED ORDER — FLUTICASONE FUROATE-VILANTEROL 200-25 MCG/INH IN AEPB: 1.0000 | INHALATION_SPRAY | Freq: Every day | RESPIRATORY_TRACT | 5 refills | Status: DC

## 2016-06-21 NOTE — Addendum Note (Signed)
Addended by: Maryanna Shape A on: 06/21/2016 03:30 PM   Modules accepted: Orders

## 2016-06-21 NOTE — Patient Instructions (Signed)
Start BREO 200 daily Split night sleep study Check PFT's and ONO  Chronic Respiratory Failure Respiratory failure is when your lungs are not working well and your breathing (respiratory) system fails. When respiratory failure occurs, it is difficult for your lungs to get enough oxygen or get rid of carbon dioxide or both. Respiratory failure can be life threatening.  Respiratory failure can be acute or chronic. Acute respiratory failure is sudden, severe, and requires emergency medical treatment. Chronic respiratory failure is less severe, happens over time, and requires ongoing treatment.  CAUSES  Any problem affecting the heart or lungs can cause respiratory failure. Some of these causes may be:  Chronic bronchitis and emphysema (COPD).  Blood clot going to the lung (pulmonary embolism).  Having water in the lungs caused by heart failure, lung injury, or infection (pulmonary edema).  Collapsed lung (pneumothorax).  Pneumonia.  Pulmonary fibrosis.  Obesity.  Asthma.  Heart failure.  Any type of trauma to the chest that can make breathing difficult.  Nerve or muscle diseases making chest movements difficult. SYMPTOMS  Signs and symptoms of chronic respiratory failure include:  Shortness of breath (dyspnea) with or without activity.  Rapid, fast breathing (tachypnea).  Wheezing.  Fast heart rate.  Bluish color to the fingernail or toenail beds.  Confusion or drowsiness or both. DIAGNOSIS  Initial diagnosis requires a thorough history and a physical exam by your health care provider. Additional tests may include:  Chest X-ray.  CT scan of your lungs.  Ultrasound to check for blood clots.  Blood tests, such as an arterial blood gas test (ABG). This is a blood test that looks at the oxygen and carbon dioxide levels in your arterial blood.  Your vital signs will be taken. This includes your respiratory rate (how many times a minute you are breathing), oxygen  saturation (this measures the oxygen level in your blood), heart rate, and blood pressure. These numbers help your health care provider determine the next steps.  Electrocardiogram. TREATMENT  Treatment of chronic respiratory failure depends on the cause of the respiratory failure. Treatment can include the following:  Oxygen. Oxygen can be delivered through the following:  Nasal cannula. This is small tubing that goes in your nose to give you oxygen.  Face mask. A face mask covers your nose and mouth to give you oxygen.  Medicine. Different medicines can be given to help with breathing. These can include:  Nebulizers. Nebulizers deliver medicines to open the air passages (bronchodilators). These medicines help to open or relax the airways in the lungs so you can breathe better. They can also help loosen mucus from your lungs.  Diuretics. Diuretic medicines can help you breathe better by getting rid of extra fluid in your body.  Steroids. Steroid medicines can help decrease inflammation in your lungs.  Chest tube. If you have a collapsed lung (pneumothorax), a chest tube is placed to help reinflate the lung.  Noninvasive positive pressure ventilation (NPPV). This is a tight-fitting mask that goes over your nose and mouth. The mask has tubing that is attached to a machine. The machine blows air into the tubing, which helps to keep the tiny air sacs (alveoli) in your lungs open. This machine allows you to breathe on your own.  Ventilator. A ventilator is a breathing machine. When on a ventilator, a breathing tube is put into the lungs. A ventilator is used when you can no longer breathe well enough on your own. You may have low oxygen levels or  high carbon dioxide (CO2) levels in your blood. When you are on a ventilator, sedation and pain medicines are given to make you sleep so your lungs can heal. HOME CARE INSTRUCTIONS  Follow your health care provider's directions about medicines and  respiratory therapy.  Quit smoking if you smoke. SEEK MEDICAL CARE IF:  You have increasing shortness of breath and are less functional than you have been.  You have increased sputum, wheezing, coughing, or loss of energy.  You are on oxygen and are requiring more. SEEK IMMEDIATE MEDICAL CARE IF:  Your shortness of breath is significantly worse.  You are unable to say more than a few words without having to catch your breath.  You are much less functional. MAKE SURE YOU:  Understand these instructions.  Will watch your condition.  Will get help right away if you are not doing well or get worse.   This information is not intended to replace advice given to you by your health care provider. Make sure you discuss any questions you have with your health care provider.   Document Released: 10/17/2005 Document Revised: 07/08/2015 Document Reviewed: 08/15/2013 Elsevier Interactive Patient Education [employer].

## 2016-06-21 NOTE — Addendum Note (Signed)
Addended by: Oscar La R on: 06/21/2016 03:06 PM   Modules accepted: Orders

## 2016-06-21 NOTE — Progress Notes (Signed)
[facility] Consultation      Date: 06/21/2016,   MRN# [id number] Theresa Huffman 1946/08/22 Code Status:  Code Status History    Date Active Date Inactive Code Status Order ID Comments User Context   06/02/2016  9:49 AM 06/02/2016  3:20 PM Full Code [id number]  [name], MD Inpatient     Hosp day:@LENGTHOFSTAYDAYS @ Referring MD: @ATDPROV @     PCP:      AdmissionWeight: (!) 371 lb (168.3 kg)                 CurrentWeight: (!) 371 lb (168.3 kg) Theresa Huffman is a 70 y.o. old female seen in consultation for SOB at the request of Dr. Deborra Medina.     CHIEF COMPLAINT:   SOB   HISTORY OF PRESENT ILLNESS   70 yo morbidly obese white female seen today for SOB.  Has been going on for 8 months and has progressively worsened in the past 2 months Has worsened with exertion patient has associated intermittent wheezing with cough, also with associated headaches and dizziness. Upon further assessment, patient had dropped Oxygen sats to 84% with very mild exertion, patient immediately placed on oxygen  She was not actively in distress, patient has no signs of infection at this time Patient has had several bouts of bronchitis in the past Patient is nonsmoker, but has extensive second hand smoking history for approx 26  Years  CT chest 04/2016 shows MILD LAD ECHO 04/2016 shows grade 1 diastolic dysfunction  Patient also with excessive daytime sleepiness and excessive fatigue during the day    PAST MEDICAL HISTORY   Past Medical History:  Diagnosis Date  . CAD (coronary artery disease)    a. Lexiscan 05/12/16: smal defect of moderate severity in the mid anterior and apical location opartially reversible at rest. EF 42%. TID ratio uincreased at 1.23 possibly c/w balanced ischemia. Frequent PVCs  . Diabetes mellitus type 2 in obese ([place])   . Essential hypertension   . Facet syndrome, lumbar   . GERD (gastroesophageal reflux disease)   . Hyperlipidemia   .  Hypothyroidism   . Low back pain   . Lumbago   . Lumbosacral spondylosis without myelopathy   . Lymphedema   . Lymphedema   . Morbid obesity ([place])   . Pain in limb   . Primary localized osteoarthrosis, lower leg      SURGICAL HISTORY   Past Surgical History:  Procedure Laterality Date  . CARDIAC CATHETERIZATION N/A 06/02/2016   Procedure: Left Heart Cath and Coronary Angiography;  Surgeon: [name], MD;  Location: [facility];  Service: Cardiovascular;  Laterality: N/A;  . CHOLECYSTECTOMY    . TUBAL LIGATION       FAMILY HISTORY   Family History  Problem Relation Age of Onset  . Cancer Mother     lung and colon  . Stroke Sister      SOCIAL HISTORY   Social History  Substance Use Topics  . Smoking status: Never Smoker  . Smokeless tobacco: Never Used  . Alcohol use No     MEDICATIONS    Home Medication:  Current Outpatient Rx  . Order #: [id number] Class: Normal  . Order #: [id number] Class: Normal  . Order #: [id number] Class: Historical Med  . Order #: [id number] Class: Normal  . Order #: [id number] Class: Historical Med  . Order #: [id number] Class: Normal  . Order #: [id number] Class: Normal  . Order #: [id number] Class: Normal  . Order #:  [id number] Class: Print  . Order #: [id number] Class: Normal  . Order #: [id number] Class: Print  . Order #: [id number] Class: Normal  . Order #: [id number] Class: Print  . Order #: [id number] Class: Normal  . Order #: [id number] Class: Normal  . Order #: [id number] Class: Normal  . Order #: [id number] Class: Normal    Current Medication:  Current Outpatient Prescriptions:  .  albuterol (PROAIR HFA) 108 (90 BASE) MCG/ACT inhaler, Inhale 2 puffs into the lungs every 4 (four) hours as needed. (Patient taking differently: Inhale 2 puffs into the lungs every 4 (four) hours as needed for wheezing. ), Disp: 1 Inhaler, Rfl: 1 .  amLODipine (NORVASC) 5 MG tablet, TAKE 1 TABLET BY MOUTH DAILY (Patient taking differently: TAKE 5 MG BY  MOUTH DAILY), Disp: 90 tablet, Rfl: 0 .  aspirin EC 81 MG tablet, Take 81 mg by mouth daily. , Disp: , Rfl:  .  baclofen (LIORESAL) 10 MG tablet, TAKE 1 TABLET BY MOUTH THREE TIMES A DAY (Patient taking differently: TAKE 10 MG BY MOUTH THREE TIMES A DAY), Disp: 90 each, Rfl: 2 .  Calcium Carbonate-Vitamin D 600-400 MG-UNIT per tablet, Take 1 tablet by mouth daily. 1200mg  once daily, Disp: , Rfl:  .  furosemide (LASIX) 40 MG tablet, Take 1 tablet (40 mg total) by mouth 2 (two) times daily. Needs appointment for labs and a follow up., Disp: 60 tablet, Rfl: 0 .  gabapentin (NEURONTIN) 300 MG capsule, TAKE TWO CAPSULES BY MOUTH THREE TIMES DAILY (Patient taking differently: TAKE 600 mg BY MOUTH TWICE TIMES DAILY), Disp: 180 capsule, Rfl: 2 .  glipiZIDE (GLUCOTROL) 10 MG tablet, TAKE 1 TABLET BY MOUTH TWICE A DAY BEFORE A MEAL, Disp: 60 tablet, Rfl: 0 .  HYDROcodone-acetaminophen (NORCO) 10-325 MG tablet, Take 1 tablet by mouth 3 (three) times daily. As needed for back or leg pain, Disp: 90 tablet, Rfl: 0 .  ibuprofen (ADVIL,MOTRIN) 800 MG tablet, TAKE 1 TABLET BY MOUTH 3 TIMES DAILY (Patient taking differently: TAKE 800 mg BY MOUTH 3 TIMES DAILY AS NEEDED FOR PAIN), Disp: 90 tablet, Rfl: 3 .  Insulin NPH, Human,, Isophane, (HUMULIN N) 100 UNIT/ML Kiwkpen, Inject units 20 units before breakfast and 15 units before bedtime (Patient taking differently: Inject units 25 units before breakfast and 20 units before bedtime), Disp: 15 mL, Rfl: 2 .  metFORMIN (GLUCOPHAGE) 1000 MG tablet, Take 1 tablet (1,000 mg total) by mouth 2 (two) times daily with a meal., Disp: 60 tablet, Rfl: 11 .  morphine (MS CONTIN) 30 MG 12 hr tablet, Take 1 tablet (30 mg total) by mouth every 12 (twelve) hours., Disp: 60 tablet, Rfl: 0 .  potassium chloride SA (K-DUR,KLOR-CON) 20 MEQ tablet, TAKE 1 TABLET BY MOUTH DAILY (Patient taking differently: TAKE 20 MEQ BY MOUTH DAILY), Disp: 90 tablet, Rfl: 2 .  simvastatin (ZOCOR) 20 MG tablet,  Take 1 tablet (20 mg total) by mouth daily. Needs appointment for labs and a follow up for additional refills., Disp: 30 tablet, Rfl: 0 .  SYNTHROID 200 MCG tablet, TAKE 1 TABLET BY MOUTH DAILY BEFORE BREAKFAST *NEED APPOINTMENT FOR FURTHER REFILLS*, Disp: 30 tablet, Rfl: 0 .  venlafaxine (EFFEXOR) 75 MG tablet, Take three tablets by mouth every night at bedtime (Patient taking differently: Take 225 mg by mouth at bedtime. ), Disp: 90 tablet, Rfl: 3    ALLERGIES   Ace inhibitors; Erythromycin; Invokana [canagliflozin]; and Other     REVIEW OF SYSTEMS   Review of Systems  Constitutional: Positive for malaise/fatigue. Negative for  chills, diaphoresis, fever and weight loss.  HENT: Negative for congestion and hearing loss.   Eyes: Negative for blurred vision and double vision.  Respiratory: Positive for cough, shortness of breath and wheezing. Negative for hemoptysis and sputum production.   Cardiovascular: Positive for leg swelling. Negative for chest pain, palpitations and orthopnea.  Gastrointestinal: Negative for abdominal pain, heartburn, nausea and vomiting.  Genitourinary: Negative for dysuria and urgency.  Musculoskeletal: Negative for back pain, myalgias and neck pain.  Skin: Negative for rash.  Neurological: Negative for dizziness, tingling, tremors, weakness and headaches.  Endo/Heme/Allergies: Does not bruise/bleed easily.  Psychiatric/Behavioral: Negative for depression, substance abuse and suicidal ideas.  All other systems reviewed and are negative.    VS: BP 132/70 (BP Location: Left Arm, Cuff Size: Normal)   Pulse 90   Ht 5\' 4"  (1.626 m)   Wt (!) 371 lb (168.3 kg)   SpO2 92%   BMI 63.68 kg/m      PHYSICAL EXAM  Physical Exam  Constitutional: She is oriented to person, place, and time. She appears well-developed and well-nourished. No distress.  HENT:  Head: Normocephalic and atraumatic.  Mouth/Throat: No oropharyngeal exudate.  Eyes: EOM are normal.  Pupils are equal, round, and reactive to light. No scleral icterus.  Neck: Normal range of motion. Neck supple.  Cardiovascular: Normal rate, regular rhythm and normal heart sounds.   No murmur heard. Pulmonary/Chest: No stridor. No respiratory distress. She has no wheezes.  Abdominal: Soft. Bowel sounds are normal.  Musculoskeletal: Normal range of motion. She exhibits edema.  Neurological: She is alert and oriented to person, place, and time. No cranial nerve deficit.  Skin: Skin is warm. She is not diaphoretic.  Psychiatric: She has a normal mood and affect.         IMAGING   CT chest 04/2016 images reviewed 06/21/2016 B/l interstitial infiltrates with mild LAD    ASSESSMENT/PLAN   70 yo morbidly obese white female with SOB is now with newly dx of chronic hypoxic resp failure-etiology is multifactorial including her morbid obesity with probable underlying OSA/OHS with reactive airways disease probable COPD from second hand smoke exposure in the setting of Grade 1 Diastolic cardiac dysfunction  1.patient will need oxygen with exertion and as needed 2.patient will need Sleep study -split night 3.check ONO 4.check PFT's 5.will start BREO 200 daily 6.albuterol as needed 7.continue lasix as prescribed  Follow up after tests are completed  I have personally obtained a history, examined the patient, evaluated laboratory and independently reviewed imaging results, formulated the assessment and plan and placed orders.  The Patient requires high complexity decision making for assessment and support, frequent evaluation and titration of therapies, application of advanced monitoring technologies and extensive interpretation of multiple databases.    Patient/Family are satisfied with Plan of action and management. All questions answered  Corrin Parker, M.D.  [facility]  Medical Director [place] Director [facility] Cardio-Pulmonary  Department

## 2016-06-21 NOTE — Progress Notes (Signed)
Patient ID: Theresa Huffman, female   DOB: 1946/10/19, 70 y.o.   MRN: [id number] Patient seen in the office today and instructed on use of Breo Ellipta.  Patient expressed understanding and demonstrated technique.

## 2016-06-27 ENCOUNTER — Telehealth: Payer: Self-pay | Admitting: Internal Medicine

## 2016-06-27 NOTE — Telephone Encounter (Signed)
Spoke with Melissa with [facility], she is aware that patient should prefrom ONO on room air per DK (verbally) Nothing further needed.

## 2016-06-27 NOTE — Telephone Encounter (Signed)
Melissa with [facility]   We ordered an O and O  Need to know if she is to do it on oxygen or with room air Can take a verbal order.  Please advise.

## 2016-06-28 ENCOUNTER — Telehealth: Payer: Self-pay | Admitting: Physical Medicine & Rehabilitation

## 2016-06-28 MED ORDER — IBUPROFEN 800 MG PO TABS: 800.0000 mg | ORAL_TABLET | Freq: Three times a day (TID) | ORAL | 3 refills | Status: DC

## 2016-06-28 MED ORDER — BACLOFEN 10 MG PO TABS: 10.0000 mg | ORAL_TABLET | Freq: Three times a day (TID) | ORAL | 2 refills | Status: DC

## 2016-06-28 MED ORDER — GABAPENTIN 300 MG PO CAPS: 600.0000 mg | ORAL_CAPSULE | Freq: Three times a day (TID) | ORAL | 2 refills | Status: DC

## 2016-06-28 NOTE — Telephone Encounter (Signed)
Patient needs a refill on Baclofen, Gabapentin and Ibuprofen.  Please call patient when this is done.

## 2016-06-29 ENCOUNTER — Encounter: Payer: Medicare Other | Attending: Physical Medicine & Rehabilitation | Admitting: Registered Nurse

## 2016-06-29 ENCOUNTER — Encounter: Payer: Self-pay | Admitting: Registered Nurse

## 2016-06-29 VITALS — BP 140/77 | HR 84 | Resp 17

## 2016-06-29 DIAGNOSIS — M7541 Impingement syndrome of right shoulder: Secondary | ICD-10-CM | POA: Insufficient documentation

## 2016-06-29 DIAGNOSIS — G8929 Other chronic pain: Secondary | ICD-10-CM | POA: Insufficient documentation

## 2016-06-29 DIAGNOSIS — M25562 Pain in left knee: Secondary | ICD-10-CM | POA: Diagnosis not present

## 2016-06-29 DIAGNOSIS — M47817 Spondylosis without myelopathy or radiculopathy, lumbosacral region: Secondary | ICD-10-CM | POA: Diagnosis not present

## 2016-06-29 DIAGNOSIS — M25511 Pain in right shoulder: Secondary | ICD-10-CM

## 2016-06-29 DIAGNOSIS — M75102 Unspecified rotator cuff tear or rupture of left shoulder, not specified as traumatic: Secondary | ICD-10-CM | POA: Insufficient documentation

## 2016-06-29 DIAGNOSIS — M5137 Other intervertebral disc degeneration, lumbosacral region: Secondary | ICD-10-CM | POA: Diagnosis not present

## 2016-06-29 DIAGNOSIS — Z5181 Encounter for therapeutic drug level monitoring: Secondary | ICD-10-CM | POA: Diagnosis not present

## 2016-06-29 DIAGNOSIS — G894 Chronic pain syndrome: Secondary | ICD-10-CM

## 2016-06-29 DIAGNOSIS — M25512 Pain in left shoulder: Secondary | ICD-10-CM

## 2016-06-29 DIAGNOSIS — I2510 Atherosclerotic heart disease of native coronary artery without angina pectoris: Secondary | ICD-10-CM

## 2016-06-29 DIAGNOSIS — M25561 Pain in right knee: Secondary | ICD-10-CM | POA: Insufficient documentation

## 2016-06-29 DIAGNOSIS — Z79899 Other long term (current) drug therapy: Secondary | ICD-10-CM | POA: Insufficient documentation

## 2016-06-29 MED ORDER — MORPHINE SULFATE ER 30 MG PO TBCR: 30.0000 mg | EXTENDED_RELEASE_TABLET | Freq: Two times a day (BID) | ORAL | 0 refills | Status: DC

## 2016-06-29 MED ORDER — HYDROCODONE-ACETAMINOPHEN 10-325 MG PO TABS: 1.0000 | ORAL_TABLET | Freq: Three times a day (TID) | ORAL | 0 refills | Status: DC

## 2016-06-29 NOTE — Progress Notes (Signed)
Subjective:    Patient ID: Theresa Huffman, female    DOB: 01-26-1946, 70 y.o.   MRN: [id number]  HPI: Theresa Huffman is a 70 year old female who returns for follow up for chronic pain and medication refill. She states her pain is located in her bilateral shoulder's, lower back and bilateral knees. She rates her pain 8. Her current exercise regime is walking short distances using loftstrand crutches in her home and performing chair exercises.  [name] was referred to cardiology due to SOB and Desaturation, she seen Dr. Yvone Neu on 05/04/16. Had a cardiac work up, CTA negative, stress test was abnormal and she had a Left heart cathetrization performed.   Husband in room.  Pain Inventory Average Pain 8 Pain Right Now 8 My pain is burning and aching  In the last 24 hours, has pain interfered with the following? General activity 8 Relation with others 6 Enjoyment of life 8 What TIME of day is your pain at its worst? morning, evening  Sleep (in general) NA  Pain is worse with: NA Pain improves with: NA Relief from Meds: NA  Mobility walk with assistance how many minutes can you walk? 10 ability to climb steps?  no do you drive?  yes use a wheelchair Do you have any goals in this area?  yes  Function disabled: date disabled NA I need assistance with the following:  meal prep, household duties and shopping Do you have any goals in this area?  yes  Neuro/Psych weakness numbness trouble walking depression  Prior Studies Any changes since last visit?  no  Physicians involved in your care Any changes since last visit?  yes   Family History  Problem Relation Age of Onset  . Cancer Mother     lung and colon  . Stroke Sister    Social History   Social History  . Marital status: Married    Spouse name: N/A  . Number of children: 1  . Years of education: N/A   Occupational History  . Diability Unemployed   Social History Main Topics  . Smoking  status: Never Smoker  . Smokeless tobacco: Never Used  . Alcohol use No  . Drug use: No  . Sexual activity: Not Asked   Other Topics Concern  . None   Social History Narrative   Lives in [place], one adult child      Caffeine use: coffee daily in am   Past Surgical History:  Procedure Laterality Date  . CARDIAC CATHETERIZATION N/A 06/02/2016   Procedure: Left Heart Cath and Coronary Angiography;  Surgeon: [name], MD;  Location: [facility];  Service: Cardiovascular;  Laterality: N/A;  . CHOLECYSTECTOMY    . TUBAL LIGATION     Past Medical History:  Diagnosis Date  . CAD (coronary artery disease)    a. Lexiscan 05/12/16: smal defect of moderate severity in the mid anterior and apical location opartially reversible at rest. EF 42%. TID ratio uincreased at 1.23 possibly c/w balanced ischemia. Frequent PVCs  . Diabetes mellitus type 2 in obese ([place])   . Essential hypertension   . Facet syndrome, lumbar   . GERD (gastroesophageal reflux disease)   . Hyperlipidemia   . Hypothyroidism   . Low back pain   . Lumbago   . Lumbosacral spondylosis without myelopathy   . Lymphedema   . Lymphedema   . Morbid obesity ([place])   . Pain in limb   . Primary  localized osteoarthrosis, lower leg    BP 140/77   Pulse 84   Resp 17   SpO2 95%   Opioid Risk Score:   Fall Risk Score:  `1  Depression screen PHQ 2/9  Depression screen [facility] 2/9 04/20/2016 01/26/2016 11/24/2015 07/16/2015  Decreased Interest 0 2 0 0  Down, Depressed, Hopeless 0 1 1 1   PHQ - 2 Score 0 3 1 1   Altered sleeping - 3 - 1  Tired, decreased energy - 3 - 1  Change in appetite - 2 - 1  Feeling bad or failure about yourself  - 2 - 1  Trouble concentrating - 0 - 0  Moving slowly or fidgety/restless - 0 - 0  Suicidal thoughts - 0 - 0  PHQ-9 Score - 13 - 5  Difficult doing work/chores - Somewhat difficult - Somewhat difficult  Some recent data might be hidden    Review of Systems  Musculoskeletal:  Positive for gait problem.  Neurological: Positive for weakness and numbness.  Psychiatric/Behavioral: Positive for dysphoric mood.       Objective:   Physical Exam  Constitutional: She is oriented to person, place, and time. She appears well-developed and well-nourished.  HENT:  Head: Normocephalic and atraumatic.  Neck: Normal range of motion. Neck supple.  Cardiovascular: Normal rate and regular rhythm.   Pulmonary/Chest: Effort normal and breath sounds normal.  Continuous Oxygen @ 2 liters nasal cannula  Musculoskeletal:  Normal Muscle Bulk and Muscle Testing Reveals:  Upper Extremities: Full ROM and Muscle Strength 5/5 Lumbar Paraspinal Tenderness: L-4-L-5 Lower Extremities: Full ROM and Muscle Strength 5/5  Arrived in Motorized Wheelchair  Neurological: She is alert and oriented to person, place, and time.  Skin: Skin is warm and dry.  Psychiatric: She has a normal mood and affect.  Nursing note and vitals reviewed.         Assessment & Plan:  1. Lumbar pain chronic, no radiculopathy: Lumbar degenerative disc L4-5 and L5-S1.  Refilled: HYDRcodone 10/325mg  one tablet TID #90.  MS CONTIN 30 mg one every 12 hours #60.  We will continue the opioid monitoring program,this consists of regular clinic visits, examinations, urine drug screen, pill counts as well as use of [place] Controlled Substance reporting System. Continue Gabapentin. 2. Bilateral knee osteoarthritis: Continue with Exercise and Heat therapy. Continue current medication regime.  3. Morbid Obesity: Continue with Diabetic Diet Regime and Exercise Regimen. 4. Muscle Spasms: Continue Baclofen  20 minutes of face to face patient care time was spent during this visit. All questions were encouraged and answered.   F/U in 1 month

## 2016-07-11 ENCOUNTER — Other Ambulatory Visit: Payer: Self-pay | Admitting: Family Medicine

## 2016-07-11 ENCOUNTER — Other Ambulatory Visit: Payer: Self-pay | Admitting: Internal Medicine

## 2016-07-14 ENCOUNTER — Telehealth: Payer: Self-pay

## 2016-07-14 ENCOUNTER — Telehealth: Payer: Self-pay | Admitting: Internal Medicine

## 2016-07-14 NOTE — Telephone Encounter (Signed)
Patient ask if she can get enough med until her next visit. glipiZIDE (GLUCOTROL) 10 MG tablet  [employer], [place] [phone] (Phone) [phone] (Fax)

## 2016-07-14 NOTE — Telephone Encounter (Signed)
Pt already has appt to see Dr Deborra Medina on 07/18/16 at 2:30 for med refills/ pt wants to know if will have lab done. Advised pt can fast except for water or black coffee for 3-4 hours prior to appt; pt understands she can have breakfast. Pt voiced understanding.

## 2016-07-18 ENCOUNTER — Other Ambulatory Visit: Payer: Self-pay | Admitting: Family Medicine

## 2016-07-18 ENCOUNTER — Ambulatory Visit: Payer: Medicare Other | Admitting: Family Medicine

## 2016-07-18 ENCOUNTER — Other Ambulatory Visit: Payer: Self-pay | Admitting: Internal Medicine

## 2016-07-19 NOTE — Telephone Encounter (Signed)
Pt has been notified multiple times OV is required but has not RTO

## 2016-07-20 ENCOUNTER — Telehealth: Payer: Self-pay | Admitting: Family Medicine

## 2016-07-20 ENCOUNTER — Ambulatory Visit: Payer: Medicare Other | Admitting: Family Medicine

## 2016-07-20 DIAGNOSIS — Z0289 Encounter for other administrative examinations: Secondary | ICD-10-CM

## 2016-07-20 NOTE — Telephone Encounter (Signed)
Patient did not come in for their appointment today for med refill Please let me know if patient needs to be contacted immediately for follow up or no follow up needed.

## 2016-07-22 ENCOUNTER — Other Ambulatory Visit: Payer: Self-pay

## 2016-07-22 MED ORDER — SIMVASTATIN 20 MG PO TABS: 20.0000 mg | ORAL_TABLET | Freq: Every day | ORAL | 0 refills | Status: DC

## 2016-07-22 NOTE — Telephone Encounter (Signed)
Pt requesting refill simvastatin to [facility]; had lipid labs 04/2015 and has appt with Dr Deborra Medina 07/25/16.pt will keep appt on Mon. Refill done.

## 2016-07-25 ENCOUNTER — Encounter: Payer: Self-pay | Admitting: Family Medicine

## 2016-07-25 ENCOUNTER — Ambulatory Visit (INDEPENDENT_AMBULATORY_CARE_PROVIDER_SITE_OTHER): Payer: Medicare Other | Admitting: Family Medicine

## 2016-07-25 VITALS — BP 172/76 | HR 116 | Wt 378.0 lb

## 2016-07-25 DIAGNOSIS — Z23 Encounter for immunization: Secondary | ICD-10-CM | POA: Diagnosis not present

## 2016-07-25 DIAGNOSIS — E1140 Type 2 diabetes mellitus with diabetic neuropathy, unspecified: Secondary | ICD-10-CM | POA: Diagnosis not present

## 2016-07-25 DIAGNOSIS — E785 Hyperlipidemia, unspecified: Secondary | ICD-10-CM

## 2016-07-25 DIAGNOSIS — IMO0002 Reserved for concepts with insufficient information to code with codable children: Secondary | ICD-10-CM

## 2016-07-25 DIAGNOSIS — I2510 Atherosclerotic heart disease of native coronary artery without angina pectoris: Secondary | ICD-10-CM

## 2016-07-25 DIAGNOSIS — E039 Hypothyroidism, unspecified: Secondary | ICD-10-CM | POA: Diagnosis not present

## 2016-07-25 DIAGNOSIS — E1165 Type 2 diabetes mellitus with hyperglycemia: Secondary | ICD-10-CM

## 2016-07-25 MED ORDER — PNEUMOCOCCAL 13-VAL CONJ VACC IM SUSP
0.5000 mL | INTRAMUSCULAR | Status: AC
  Administered 2016-07-25: 0.5 mL via INTRAMUSCULAR

## 2016-07-25 MED ORDER — SIMVASTATIN 20 MG PO TABS: 20.0000 mg | ORAL_TABLET | Freq: Every day | ORAL | 3 refills | Status: DC

## 2016-07-25 NOTE — Assessment & Plan Note (Signed)
Followed by endo. No changes made today. 

## 2016-07-25 NOTE — Progress Notes (Signed)
Subjective:   Patient ID: Theresa Huffman, female    DOB: 1946-01-23, 70 y.o.   MRN: [id number]  Theresa Huffman is a pleasant 70 y.o. year old morbidly obese female with a h/o morbid obesity, DM, hypothyroidism who presents to clinic today with Follow-up and Medication Refill on 07/25/2016  HPI:  DM- improved control now that she is being compliant with endo recommendations ( seeing Dr. Cruzita Lederer).  Taking glucotrol 10 mg twice daily and Metformin  1000 mg twice daily and now on insulin as well.  Lab Results  Component Value Date   HGBA1C 6.1 12/15/2015   Hypothyroidism- takes synthroid 175 mcg daily.  Lab Results  Component Value Date   TSH 2.32 04/13/2016   Denies any other symptoms of hypo or hyperthyroidism.  HTN- currently taking Norvasc 5 mg daily. Blood pressure very elevated today. Rushed to get here and did not take her rxs.  No HA, blurred vision, CP or SOB. BP Readings from Last 3 Encounters:  07/25/16 (!) 172/76  06/29/16 140/77  06/21/16 132/70   HLD- due for labs.  Compliant with statin. Lab Results  Component Value Date   CHOL 150 04/23/2015   HDL 47.10 04/23/2015   LDLCALC 68 04/23/2015   LDLDIRECT 92.5 08/25/2014   TRIG 177.0 (H) 04/23/2015   CHOLHDL 3 04/23/2015     Lab Results  Component Value Date   CREATININE 0.94 05/27/2016    Current Outpatient Prescriptions on File Prior to Visit  Medication Sig Dispense Refill  . albuterol (PROAIR HFA) 108 (90 BASE) MCG/ACT inhaler Inhale 2 puffs into the lungs every 4 (four) hours as needed. (Patient taking differently: Inhale 2 puffs into the lungs every 4 (four) hours as needed for wheezing. ) 1 Inhaler 1  . amLODipine (NORVASC) 5 MG tablet TAKE 1 TABLET BY MOUTH DAILY (Patient taking differently: TAKE 5 MG BY MOUTH DAILY) 90 tablet 0  . aspirin EC 81 MG tablet Take 81 mg by mouth daily.     . baclofen (LIORESAL) 10 MG tablet Take 1 tablet (10 mg total) by mouth 3 (three) times daily. 90 each 2  .  Calcium Carbonate-Vitamin D 600-400 MG-UNIT per tablet Take 1 tablet by mouth daily. 1200mg  once daily    . fluticasone furoate-vilanterol (BREO ELLIPTA) 200-25 MCG/INH AEPB Inhale 1 puff into the lungs daily. 60 each 5  . furosemide (LASIX) 40 MG tablet Take 1 tablet (40 mg total) by mouth 2 (two) times daily. Needs appointment for labs and a follow up. 60 tablet 0  . gabapentin (NEURONTIN) 300 MG capsule Take 2 capsules (600 mg total) by mouth 3 (three) times daily. 180 capsule 2  . glipiZIDE (GLUCOTROL) 10 MG tablet TAKE 1 TABLET BY MOUTH TWICE DAILY BEFORE A MEAL *NEED APPOINTMENT FOR FURTHER REFILLS* 60 tablet 0  . HYDROcodone-acetaminophen (NORCO) 10-325 MG tablet Take 1 tablet by mouth 3 (three) times daily. As needed for back or leg pain 90 tablet 0  . ibuprofen (ADVIL,MOTRIN) 800 MG tablet Take 1 tablet (800 mg total) by mouth 3 (three) times daily. 90 tablet 3  . Insulin NPH, Human,, Isophane, (HUMULIN N) 100 UNIT/ML Kiwkpen Inject units 20 units before breakfast and 15 units before bedtime (Patient taking differently: Inject units 25 units before breakfast and 20 units before bedtime) 15 mL 2  . metFORMIN (GLUCOPHAGE) 1000 MG tablet Take 1 tablet (1,000 mg total) by mouth 2 (two) times daily with a meal. 60 tablet 11  . morphine (MS CONTIN)  30 MG 12 hr tablet Take 1 tablet (30 mg total) by mouth every 12 (twelve) hours. 60 tablet 0  . potassium chloride SA (K-DUR,KLOR-CON) 20 MEQ tablet TAKE 1 TABLET BY MOUTH DAILY (Patient taking differently: TAKE 20 MEQ BY MOUTH DAILY) 90 tablet 2  . SYNTHROID 200 MCG tablet TAKE 1 TABLET BY MOUTH DAILY BEFORE BREAKFAST *NEED APPOINTMENT FOR FURTHER REFILLS* 30 tablet 0  . venlafaxine (EFFEXOR) 75 MG tablet Take three tablets by mouth every night at bedtime (Patient taking differently: Take 225 mg by mouth at bedtime. ) 90 tablet 3   No current facility-administered medications on file prior to visit.     Allergies  Allergen Reactions  . Ace  Inhibitors Other (See Comments)    Angioedema   . Erythromycin Nausea And Vomiting  . Invokana [Canagliflozin] Other (See Comments)    Yeast infection  . Other Rash and Other (See Comments)    Chalky drink    Past Medical History:  Diagnosis Date  . CAD (coronary artery disease)    a. Lexiscan 05/12/16: smal defect of moderate severity in the mid anterior and apical location opartially reversible at rest. EF 42%. TID ratio uincreased at 1.23 possibly c/w balanced ischemia. Frequent PVCs  . Diabetes mellitus type 2 in obese ([place])   . Essential hypertension   . Facet syndrome, lumbar   . GERD (gastroesophageal reflux disease)   . Hyperlipidemia   . Hypothyroidism   . Low back pain   . Lumbago   . Lumbosacral spondylosis without myelopathy   . Lymphedema   . Lymphedema   . Morbid obesity ([place])   . Pain in limb   . Primary localized osteoarthrosis, lower leg     Past Surgical History:  Procedure Laterality Date  . CARDIAC CATHETERIZATION N/A 06/02/2016   Procedure: Left Heart Cath and Coronary Angiography;  Surgeon: [name], MD;  Location: [facility];  Service: Cardiovascular;  Laterality: N/A;  . CHOLECYSTECTOMY    . TUBAL LIGATION      Family History  Problem Relation Age of Onset  . Cancer Mother     lung and colon  . Stroke Sister     Social History   Social History  . Marital status: Married    Spouse name: N/A  . Number of children: 1  . Years of education: N/A   Occupational History  . Diability Unemployed   Social History Main Topics  . Smoking status: Never Smoker  . Smokeless tobacco: Never Used  . Alcohol use No  . Drug use: No  . Sexual activity: Not on file   Other Topics Concern  . Not on file   Social History Narrative   Lives in [place], one adult child      Caffeine use: coffee daily in am   The PMH, PSH, Social History, Family History, Medications, and allergies have been reviewed in [facility], and have been updated  if relevant.    Review of Systems  Constitutional: Negative for chills.  HENT: Negative for congestion, postnasal drip and rhinorrhea.   Respiratory: Positive for shortness of breath. Negative for wheezing.   Cardiovascular: Negative.   Gastrointestinal: Negative for abdominal pain, anal bleeding, blood in stool, constipation, diarrhea, nausea, rectal pain and vomiting.  Endocrine: Negative.   Genitourinary: Negative.   Musculoskeletal: Negative.   Allergic/Immunologic: Negative.   Neurological: Negative.   Hematological: Negative.   All other systems reviewed and are negative.      Objective:  BP (!) 172/76   Pulse (!) 116   Wt (!) 378 lb (171.5 kg)   SpO2 90%   BMI 64.88 kg/m    Physical Exam  Constitutional: She is oriented to person, place, and time. She appears well-developed and well-nourished. No distress.  Morbidly obese  HENT:  Head: Normocephalic and atraumatic.  Eyes: Conjunctivae are normal.  Neck: Normal range of motion.  Cardiovascular:  Tachycardia, resolved after she has been sitting in room for a few minutes  Pulmonary/Chest: Effort normal. No respiratory distress. She has no wheezes. She has no rales. She exhibits no tenderness.  Abdominal: She exhibits no distension.  Difficult to assess due to body habitus  Musculoskeletal: Normal range of motion.  Neurological: She is alert and oriented to person, place, and time. No cranial nerve deficit.  Skin: Skin is warm and dry.  Psychiatric: She has a normal mood and affect. Her behavior is normal. Judgment and thought content normal.  Nursing note and vitals reviewed.         Assessment & Plan:   Uncontrolled type 2 diabetes mellitus with diabetic neuropathy, without long-term current use of insulin ([place]) - Plan: CANCELED: Hemoglobin A1c  Hypothyroidism, unspecified hypothyroidism type  HLD (hyperlipidemia) - Plan: Lipid panel  Morbid obesity, unspecified obesity type ([place])  Need for  prophylactic vaccination against Streptococcus pneumoniae (pneumococcus) - Plan: pneumococcal 13-valent conjugate vaccine (PREVNAR 13) injection 0.5 mL  Need for influenza vaccination - Plan: Flu Vaccine QUAD 36+ mos PF IM (Fluarix & Fluzone Quad PF) No Follow-up on file.

## 2016-07-25 NOTE — Assessment & Plan Note (Signed)
Followed by endo.  

## 2016-07-25 NOTE — Progress Notes (Signed)
Pre visit review using our clinic review tool, if applicable. No additional management support is needed unless otherwise documented below in the visit note. 

## 2016-07-25 NOTE — Assessment & Plan Note (Signed)
Due for labs. eRx sent today.

## 2016-07-27 ENCOUNTER — Encounter: Payer: Self-pay | Admitting: Registered Nurse

## 2016-07-27 ENCOUNTER — Encounter: Payer: Medicare Other | Attending: Physical Medicine & Rehabilitation | Admitting: Registered Nurse

## 2016-07-27 VITALS — BP 137/64 | HR 84

## 2016-07-27 DIAGNOSIS — Z79899 Other long term (current) drug therapy: Secondary | ICD-10-CM | POA: Diagnosis not present

## 2016-07-27 DIAGNOSIS — M25562 Pain in left knee: Secondary | ICD-10-CM | POA: Insufficient documentation

## 2016-07-27 DIAGNOSIS — M7541 Impingement syndrome of right shoulder: Secondary | ICD-10-CM | POA: Insufficient documentation

## 2016-07-27 DIAGNOSIS — M47817 Spondylosis without myelopathy or radiculopathy, lumbosacral region: Secondary | ICD-10-CM

## 2016-07-27 DIAGNOSIS — M25561 Pain in right knee: Secondary | ICD-10-CM | POA: Diagnosis not present

## 2016-07-27 DIAGNOSIS — M75102 Unspecified rotator cuff tear or rupture of left shoulder, not specified as traumatic: Secondary | ICD-10-CM | POA: Insufficient documentation

## 2016-07-27 DIAGNOSIS — G894 Chronic pain syndrome: Secondary | ICD-10-CM

## 2016-07-27 DIAGNOSIS — Z5181 Encounter for therapeutic drug level monitoring: Secondary | ICD-10-CM | POA: Diagnosis not present

## 2016-07-27 DIAGNOSIS — G8929 Other chronic pain: Secondary | ICD-10-CM | POA: Diagnosis not present

## 2016-07-27 DIAGNOSIS — I2510 Atherosclerotic heart disease of native coronary artery without angina pectoris: Secondary | ICD-10-CM

## 2016-07-27 MED ORDER — HYDROCODONE-ACETAMINOPHEN 10-325 MG PO TABS: 1.0000 | ORAL_TABLET | Freq: Three times a day (TID) | ORAL | 0 refills | Status: DC

## 2016-07-27 MED ORDER — MORPHINE SULFATE ER 30 MG PO TBCR: 30.0000 mg | EXTENDED_RELEASE_TABLET | Freq: Two times a day (BID) | ORAL | 0 refills | Status: DC

## 2016-07-27 NOTE — Progress Notes (Signed)
Subjective:    Patient ID: Theresa Huffman, female    DOB: Feb 15, 1946, 70 y.o.   MRN: [id number]  HPI: [name] is a 70 year old female who returns for follow up for chronic pain and medication refill. She states her pain is located in her right shoulder and lower back and right knee. She rates her pain 8. Her current exercise regime is walking short distances using loftstrand crutches in her home and performing chair exercises.  Husband in room.  Pain Inventory Average Pain 8 Pain Right Now 8 My pain is burning and aching  In the last 24 hours, has pain interfered with the following? General activity 9 Relation with others 7 Enjoyment of life 7 What TIME of day is your pain at its worst? morning and evening Sleep (in general) Poor  Pain is worse with: walking, bending and standing Pain improves with: rest and medication Relief from Meds: n/a  Mobility walk with assistance how many minutes can you walk? 0 ability to climb steps?  no do you drive?  yes use a wheelchair transfers alone Do you have any goals in this area?  yes  Function disabled: date disabled n/a I need assistance with the following:  meal prep, household duties and shopping Do you have any goals in this area?  yes  Neuro/Psych weakness numbness trouble walking depression  Prior Studies Any changes since last visit?  no  Physicians involved in your care Any changes since last visit?  no   Family History  Problem Relation Age of Onset  . Cancer Mother     lung and colon  . Stroke Sister    Social History   Social History  . Marital status: Married    Spouse name: N/A  . Number of children: 1  . Years of education: N/A   Occupational History  . Diability Unemployed   Social History Main Topics  . Smoking status: Never Smoker  . Smokeless tobacco: Never Used  . Alcohol use No  . Drug use: No  . Sexual activity: Not on file   Other Topics Concern  . Not on  file   Social History Narrative   Lives in [place], one adult child      Caffeine use: coffee daily in am   Past Surgical History:  Procedure Laterality Date  . CARDIAC CATHETERIZATION N/A 06/02/2016   Procedure: Left Heart Cath and Coronary Angiography;  Surgeon: [name], MD;  Location: [facility];  Service: Cardiovascular;  Laterality: N/A;  . CHOLECYSTECTOMY    . TUBAL LIGATION     Past Medical History:  Diagnosis Date  . CAD (coronary artery disease)    a. Lexiscan 05/12/16: smal defect of moderate severity in the mid anterior and apical location opartially reversible at rest. EF 42%. TID ratio uincreased at 1.23 possibly c/w balanced ischemia. Frequent PVCs  . Diabetes mellitus type 2 in obese ([place])   . Essential hypertension   . Facet syndrome, lumbar   . GERD (gastroesophageal reflux disease)   . Hyperlipidemia   . Hypothyroidism   . Low back pain   . Lumbago   . Lumbosacral spondylosis without myelopathy   . Lymphedema   . Lymphedema   . Morbid obesity ([place])   . Pain in limb   . Primary localized osteoarthrosis, lower leg    There were no vitals taken for this visit.  Opioid Risk Score:   Fall Risk Score:  `1  Depression screen PHQ 2/9  Depression screen [facility] 2/9 04/20/2016 01/26/2016 11/24/2015 07/16/2015  Decreased Interest 0 2 0 0  Down, Depressed, Hopeless 0 1 1 1   PHQ - 2 Score 0 3 1 1   Altered sleeping - 3 - 1  Tired, decreased energy - 3 - 1  Change in appetite - 2 - 1  Feeling bad or failure about yourself  - 2 - 1  Trouble concentrating - 0 - 0  Moving slowly or fidgety/restless - 0 - 0  Suicidal thoughts - 0 - 0  PHQ-9 Score - 13 - 5  Difficult doing work/chores - Somewhat difficult - Somewhat difficult  Some recent data might be hidden     Review of Systems  Musculoskeletal: Positive for gait problem.  Neurological: Positive for weakness and numbness.  Psychiatric/Behavioral: Positive for dysphoric mood.         Objective:   Physical Exam  Constitutional: She is oriented to person, place, and time. She appears well-developed and well-nourished.  Morbid Obesity  HENT:  Head: Normocephalic and atraumatic.  Neck: Normal range of motion. Neck supple.  Cardiovascular: Normal rate and regular rhythm.   Pulmonary/Chest: Effort normal and breath sounds normal.  Musculoskeletal:  Normal Muscle Bulk and Muscle Testing Reveals: Upper Extremities: Full ROM and Muscle Strength 5/5 Back without spinal tenderness Lower Extremities: Full ROM and Muscle Strength 5/5 Arrived in Motorized Wheelchair  Neurological: She is alert and oriented to person, place, and time.  Skin: Skin is warm and dry.  Psychiatric: She has a normal mood and affect.  Nursing note and vitals reviewed.         Assessment & Plan:  1. Lumbar pain chronic, no radiculopathy: Lumbar degenerative disc L4-5 and L5-S1.  Refilled: HYDRcodone 10/325mg  one tablet TID #90.  MS CONTIN 30 mg one every 12 hours #60.  We will continue the opioid monitoring program,this consists of regular clinic visits, examinations, urine drug screen, pill counts as well as use of [place] Controlled Substance reporting System. Continue Gabapentin. 2. Bilateral knee osteoarthritis: Continue with Exercise and Heat therapy. Continue current medication regime.  3. Morbid Obesity: Continue with Diabetic Diet Regime and Exercise Regimen. 4. Muscle Spasms: Continue Baclofen  20 minutes of face to face patient care time was spent during this visit. All questions were encouraged and answered.   F/U in 1 month

## 2016-07-28 NOTE — Addendum Note (Signed)
Addended by: Geryl Rankins D on: 07/28/2016 02:24 PM   Modules accepted: Orders

## 2016-08-02 ENCOUNTER — Other Ambulatory Visit: Payer: Self-pay | Admitting: Family Medicine

## 2016-08-04 LAB — 6-ACETYLMORPHINE,TOXASSURE ADD
6-ACETYLMORPHINE: NEGATIVE
6-ACETYLMORPHINE: NOT DETECTED ng/mg{creat}

## 2016-08-04 LAB — TOXASSURE SELECT,+ANTIDEPR,UR

## 2016-08-05 NOTE — Progress Notes (Signed)
Urine drug screen for this encounter is consistent for prescribed medication 

## 2016-08-10 ENCOUNTER — Ambulatory Visit: Payer: Medicare Other | Attending: Internal Medicine

## 2016-08-10 DIAGNOSIS — G4733 Obstructive sleep apnea (adult) (pediatric): Secondary | ICD-10-CM | POA: Diagnosis not present

## 2016-08-15 ENCOUNTER — Telehealth: Payer: Self-pay | Admitting: *Deleted

## 2016-08-15 ENCOUNTER — Other Ambulatory Visit: Payer: Self-pay | Admitting: Internal Medicine

## 2016-08-15 ENCOUNTER — Other Ambulatory Visit: Payer: Self-pay | Admitting: Family Medicine

## 2016-08-15 DIAGNOSIS — G4733 Obstructive sleep apnea (adult) (pediatric): Secondary | ICD-10-CM

## 2016-08-15 NOTE — Telephone Encounter (Signed)
-----   Message from Laverle Hobby, MD sent at 08/12/2016  3:57 PM EDT ----- Regarding: Sleep study Sleep study positive for severe OSA.  Recommend Sleep study with CPAP titration. IF IN-LAB NOT COVERED -- can perform CPAP auto-titration study and follow up with overnight oxymetry on CPAP to check oxygenation.

## 2016-08-15 NOTE — Telephone Encounter (Signed)
Pt informed of sleep study results. Order placed for titration study.

## 2016-08-15 NOTE — Telephone Encounter (Signed)
LMOVM for pt to return call for sleep study results.

## 2016-08-16 ENCOUNTER — Other Ambulatory Visit: Payer: Self-pay | Admitting: Internal Medicine

## 2016-08-16 NOTE — Telephone Encounter (Signed)
Pt has not had CMP since 04/2015

## 2016-08-29 ENCOUNTER — Encounter: Payer: Self-pay | Admitting: Physical Medicine & Rehabilitation

## 2016-08-29 ENCOUNTER — Ambulatory Visit (HOSPITAL_BASED_OUTPATIENT_CLINIC_OR_DEPARTMENT_OTHER): Payer: Medicare Other | Admitting: Physical Medicine & Rehabilitation

## 2016-08-29 ENCOUNTER — Encounter: Payer: Medicare Other | Admitting: Registered Nurse

## 2016-08-29 ENCOUNTER — Encounter: Payer: Medicare Other | Attending: Physical Medicine & Rehabilitation

## 2016-08-29 VITALS — BP 122/55 | HR 85 | Resp 14

## 2016-08-29 DIAGNOSIS — M25562 Pain in left knee: Secondary | ICD-10-CM | POA: Diagnosis not present

## 2016-08-29 DIAGNOSIS — Z5181 Encounter for therapeutic drug level monitoring: Secondary | ICD-10-CM | POA: Insufficient documentation

## 2016-08-29 DIAGNOSIS — IMO0002 Reserved for concepts with insufficient information to code with codable children: Secondary | ICD-10-CM

## 2016-08-29 DIAGNOSIS — I2510 Atherosclerotic heart disease of native coronary artery without angina pectoris: Secondary | ICD-10-CM

## 2016-08-29 DIAGNOSIS — M25561 Pain in right knee: Secondary | ICD-10-CM | POA: Insufficient documentation

## 2016-08-29 DIAGNOSIS — M7541 Impingement syndrome of right shoulder: Secondary | ICD-10-CM | POA: Insufficient documentation

## 2016-08-29 DIAGNOSIS — M75102 Unspecified rotator cuff tear or rupture of left shoulder, not specified as traumatic: Secondary | ICD-10-CM | POA: Insufficient documentation

## 2016-08-29 DIAGNOSIS — M7542 Impingement syndrome of left shoulder: Secondary | ICD-10-CM

## 2016-08-29 DIAGNOSIS — Z79899 Other long term (current) drug therapy: Secondary | ICD-10-CM | POA: Insufficient documentation

## 2016-08-29 DIAGNOSIS — E1140 Type 2 diabetes mellitus with diabetic neuropathy, unspecified: Secondary | ICD-10-CM | POA: Diagnosis not present

## 2016-08-29 DIAGNOSIS — G8929 Other chronic pain: Secondary | ICD-10-CM

## 2016-08-29 DIAGNOSIS — E1165 Type 2 diabetes mellitus with hyperglycemia: Secondary | ICD-10-CM

## 2016-08-29 MED ORDER — MORPHINE SULFATE ER 30 MG PO TBCR: 30.0000 mg | EXTENDED_RELEASE_TABLET | Freq: Two times a day (BID) | ORAL | 0 refills | Status: DC

## 2016-08-29 MED ORDER — HYDROCODONE-ACETAMINOPHEN 10-325 MG PO TABS: 1.0000 | ORAL_TABLET | Freq: Three times a day (TID) | ORAL | 0 refills | Status: DC

## 2016-08-29 NOTE — Patient Instructions (Signed)
Have sent a dietary consultation to [facility]

## 2016-08-29 NOTE — Progress Notes (Signed)
Subjective:    Patient ID: Theresa Huffman, female    DOB: Feb 25, 1946, 70 y.o.   MRN: [id number]  HPI Patient is still independent with dressing and bathing. She uses forearm crutches at home during the day. She uses the motorized wheelchair only outside the home. She ambulates very infrequently in the home. Mainly to get in and out of the bathroom.  Has been diagnosed with COPD, multifactorial respiratory failure and is on chronic oxygen at the current time. She had a heart catheterization since I last saw the patient and significant coronary artery disease was identified.  Main complaints remain knee pain as well as shoulder pain.  Pain Inventory Average Pain 7 Pain Right Now 7 My pain is burning and aching  In the last 24 hours, has pain interfered with the following? General activity 7 Relation with others 7 Enjoyment of life 7 What TIME of day is your pain at its worst? morning, daytime, evening Sleep (in general) Poor  Pain is worse with: walking, bending and standing Pain improves with: rest and medication Relief from Meds: no selection  Mobility walk with assistance use a walker ability to climb steps?  no do you drive?  yes use a wheelchair transfers alone  Function disabled: date disabled . I need assistance with the following:  meal prep, household duties and shopping Do you have any goals in this area?  yes  Neuro/Psych weakness numbness trouble walking depression  Prior Studies Any changes since last visit?  no  Physicians involved in your care Any changes since last visit?  no   Family History  Problem Relation Age of Onset  . Cancer Mother     lung and colon  . Stroke Sister    Social History   Social History  . Marital status: Married    Spouse name: N/A  . Number of children: 1  . Years of education: N/A   Occupational History  . Diability Unemployed   Social History Main Topics  . Smoking status: Never Smoker  . Smokeless  tobacco: Never Used  . Alcohol use No  . Drug use: No  . Sexual activity: Not Asked   Other Topics Concern  . None   Social History Narrative   Lives in [place], one adult child      Caffeine use: coffee daily in am   Past Surgical History:  Procedure Laterality Date  . CARDIAC CATHETERIZATION N/A 06/02/2016   Procedure: Left Heart Cath and Coronary Angiography;  Surgeon: [name], MD;  Location: [facility];  Service: Cardiovascular;  Laterality: N/A;  . CHOLECYSTECTOMY    . TUBAL LIGATION     Past Medical History:  Diagnosis Date  . CAD (coronary artery disease)    a. Lexiscan 05/12/16: smal defect of moderate severity in the mid anterior and apical location opartially reversible at rest. EF 42%. TID ratio uincreased at 1.23 possibly c/w balanced ischemia. Frequent PVCs  . Diabetes mellitus type 2 in obese ([place])   . Essential hypertension   . Facet syndrome, lumbar   . GERD (gastroesophageal reflux disease)   . Hyperlipidemia   . Hypothyroidism   . Low back pain   . Lumbago   . Lumbosacral spondylosis without myelopathy   . Lymphedema   . Lymphedema   . Morbid obesity ([place])   . Pain in limb   . Primary localized osteoarthrosis, lower leg    BP (!) 122/55 (BP Location: Right Wrist, Patient Position: Sitting, Cuff  Size: Large)   Pulse 85   Resp 14   SpO2 92%   Opioid Risk Score:   Fall Risk Score:  `1  Depression screen PHQ 2/9  Depression screen [facility] 2/9 04/20/2016 01/26/2016 11/24/2015 07/16/2015  Decreased Interest 0 2 0 0  Down, Depressed, Hopeless 0 1 1 1   PHQ - 2 Score 0 3 1 1   Altered sleeping - 3 - 1  Tired, decreased energy - 3 - 1  Change in appetite - 2 - 1  Feeling bad or failure about yourself  - 2 - 1  Trouble concentrating - 0 - 0  Moving slowly or fidgety/restless - 0 - 0  Suicidal thoughts - 0 - 0  PHQ-9 Score - 13 - 5  Difficult doing work/chores - Somewhat difficult - Somewhat difficult  Some recent data might be hidden     Review of Systems  HENT: Negative.   Eyes: Negative.   Respiratory: Negative.   Cardiovascular: Negative.   Gastrointestinal: Negative.   Endocrine: Negative.   Genitourinary: Negative.   Musculoskeletal: Positive for gait problem.  Skin: Negative.   Allergic/Immunologic: Negative.   Neurological: Positive for weakness and numbness.  Hematological: Negative.   Psychiatric/Behavioral: Positive for dysphoric mood.  All other systems reviewed and are negative.      Objective:   Physical Exam  Constitutional: She is oriented to person, place, and time. She appears well-developed.  Morbid Obesity  HENT:  Head: Normocephalic and atraumatic.  Eyes: Conjunctivae and EOM are normal. Pupils are equal, round, and reactive to light.  Neck: Normal range of motion.  Pulmonary/Chest: Effort normal. No respiratory distress.  Musculoskeletal:  No pain with knee range of motion bilaterally. No tenderness over the joint lines or over the patellar tendon.  Neurological: She is alert and oriented to person, place, and time.  Psychiatric: She has a normal mood and affect.    Intact sensation to pp in feet and hands 4/5 strength in bilateral hip flexor, knee extensor, ankle dorsiflexors Sensation intact pinprick, bilateral lower extremities. Mood and affect are appropriate Negative shoulder impingement on examination    Assessment & Plan:  1. Chronic pain syndrome with bilateral end-stage osteoarthritis of the knees Continue current dose of hydrocodone 10 mg 3 times a day Continue opioid monitoring program. This consists of regular clinic visits, examinations, urine drug screen, pill counts as well as use of [place] controlled substance reporting System. UDS 07/27/16 reviewed and consistent Return to clinic one month with nurse practitioner 2. Chronic shoulder pain, however, has full range of motion and good upper extremity function

## 2016-08-30 ENCOUNTER — Ambulatory Visit (INDEPENDENT_AMBULATORY_CARE_PROVIDER_SITE_OTHER): Payer: Medicare Other | Admitting: Internal Medicine

## 2016-08-30 ENCOUNTER — Encounter: Payer: Self-pay | Admitting: Internal Medicine

## 2016-08-30 VITALS — BP 140/82 | HR 101 | Wt 384.0 lb

## 2016-08-30 DIAGNOSIS — I2510 Atherosclerotic heart disease of native coronary artery without angina pectoris: Secondary | ICD-10-CM | POA: Diagnosis not present

## 2016-08-30 DIAGNOSIS — E039 Hypothyroidism, unspecified: Secondary | ICD-10-CM

## 2016-08-30 DIAGNOSIS — IMO0002 Reserved for concepts with insufficient information to code with codable children: Secondary | ICD-10-CM

## 2016-08-30 DIAGNOSIS — Z794 Long term (current) use of insulin: Secondary | ICD-10-CM | POA: Diagnosis not present

## 2016-08-30 DIAGNOSIS — E1140 Type 2 diabetes mellitus with diabetic neuropathy, unspecified: Secondary | ICD-10-CM | POA: Diagnosis not present

## 2016-08-30 DIAGNOSIS — E1165 Type 2 diabetes mellitus with hyperglycemia: Secondary | ICD-10-CM | POA: Diagnosis not present

## 2016-08-30 LAB — POCT GLYCOSYLATED HEMOGLOBIN (HGB A1C): Hemoglobin A1C: 6.5

## 2016-08-30 LAB — LIPID PANEL
CHOL/HDL RATIO: 3
Cholesterol: 159 mg/dL (ref 0–200)
HDL: 61.5 mg/dL (ref >39.00)
LDL CALC: 69 mg/dL (ref 0–99)
NONHDL: 97.41
TRIGLYCERIDES: 143 mg/dL (ref 0.0–149.0)
VLDL: 28.6 mg/dL (ref 0.0–40.0)

## 2016-08-30 MED ORDER — GLIPIZIDE 10 MG PO TABS: ORAL_TABLET | ORAL | 3 refills | Status: DC

## 2016-08-30 NOTE — Progress Notes (Signed)
Patient ID: Theresa Huffman, female   DOB: 1946/06/06, 70 y.o.   MRN: [id number]  HPI: Theresa Huffman is a 70 y.o.-year-old female, returning for f/u for DM2, dx 2010, insulin-dependent, uncontrolled, with complications (Peripheral neuropathy, mild CKD) and hypothyroidism. Last visit 8 mo ago.   DM2: Last hemoglobin A1c was: Lab Results  Component Value Date   HGBA1C 6.1 12/15/2015   HGBA1C 9.2 (H) 03/10/2015   HGBA1C 8.8 (H) 10/02/2014  Prev. HbA1C was 8.5% in 04/2011. She believes her HbA1c increased due to getting back to eating sweats.   Pt is on a regimen of: - Metformin 1000 bid - Glipizide 5 bid - NPH insulin - 25 units in am  - 15-20 units at bedtime She was on Invokana 300 mg -  she had run out of it >> restarted >> developed a yeast inf >> stopped it 08/26/2014 We started Cycloset - 1.6 mg >> ran out of it and did not refill We had to stop Januvia 100 - expensive. She is in the doughnut hole by the end of the year.   Pt checks her sugars once a day and they are: - am: 118, 144-181, 204 >> 140-187 >> 176, 194-352 >> 87, 100--146 >> 79, 83, 94-145 >> 96, 94-143, 165, 227 - before lunch: 208 >> 186 >> n/c - before dinner: 125-159 >> 114-163 >> n/c >> 97-150 >> 103-122 >> 104 - 2h after dinner: 225 >> 139-189 >> n/c - bedtime: 115, 138, 141 >> n/c >> 142-187 >> n/c >> 93-182, 206 if eats out  >> 127-186 >> 85, 104-158 - nighttime: 153 >> n/c No lows. Lowest sugar was 87 >> 79 >> 76; she has hypoglycemia awareness at 80.  Highest sugar was low 200s >> 227.  She was considering GBP, but is afraid of the sx.   - has mild CKD, last BUN/creatinine:  Lab Results  Component Value Date   BUN 15 05/27/2016   CREATININE 0.94 05/27/2016  Has h/o angioedema - ACEI stopped - last set of lipids: Lab Results  Component Value Date   CHOL 150 04/23/2015   HDL 47.10 04/23/2015   LDLCALC 68 04/23/2015   LDLDIRECT 92.5 08/25/2014   TRIG 177.0 (H) 04/23/2015   CHOLHDL 3  04/23/2015  She is on Zocor. She takes ASA 81. - last eye exam was in 04/20/2015. No DR.  - + numbness and tingling in her feet - improved   Pt also has a history of HTN, Hypothyroidism, vit D def., HL, morbid obesity, lymphedema, vv insuff., GERD, lumbago, chronic bilateral knee pain. No h/o UTIs.  Hypothyroidism: - dx 2001 - on Synthroid DAW 175 >> 200 mcg daily  - At 10 am - Eats 1 h later - With water - + calcium + D at night - no MVI, PPI  - reviewed TFTs: Lab Results  Component Value Date   TSH 2.32 04/13/2016   TSH 4.27 12/15/2015   TSH 3.57 04/23/2015   TSH 5.83 (H) 03/10/2015   TSH 2.08 04/18/2013   FREET4 1.20 12/15/2015   FREET4 1.19 04/23/2015   FREET4 1.09 03/10/2015   FREET4 1.37 02/13/2012   I reviewed pt's medications, allergies, PMH, social hx, family hx, and changes were documented in the history of present illness. Otherwise, unchanged from my initial visit note.  ROS: Constitutional: + weight gain, no fatigue, no subjective hyperthermia/hypothermia Eyes: no blurry vision, no xerophthalmia ENT: no sore throat, no nodules palpated in throat, no dysphagia/odynophagia, no hoarseness  Cardiovascular: no CP/+SOB /palpitations/+ leg swelling Respiratory: no cough/+SOB Gastrointestinal: no N/V/D/C, hair loss Musculoskeletal: no muscle/ joint aches Skin: no rashes  PE: BP 140/82 (BP Location: Left Arm, Patient Position: Sitting)   Pulse (!) 101   Wt (!) 384 lb (174.2 kg)   SpO2 95% Comment: 2L  BMI 65.91 kg/m  Body mass index is 65.91 kg/m.  Wt Readings from Last 3 Encounters:  08/30/16 (!) 384 lb (174.2 kg)  07/25/16 (!) 378 lb (171.5 kg)  06/21/16 (!) 371 lb (168.3 kg)   Constitutional: obesity grade 3, in NAD Eyes: PERRLA, EOMI, no exophthalmos ENT: moist mucous membranes, no thyromegaly, no cervical lymphadenopathy Cardiovascular: tachycardia, RR, No MRG Respiratory: CTA B Gastrointestinal: abdomen soft, NT, ND, BS+ Musculoskeletal: strength  intact in all 4 Skin: moist, warm, no rashes Severe lymphedema bilateral legs, but improved  ASSESSMENT: 1. DM2, insulin-dependent, uncontrolled, with complications - PN, on Neurontin  2. Hypothyroidism - On d.a.w. Synthroid    PLAN:  1. Patient with long-standing, Uncontrolled diabetes, with better control at last visit after previously starting NPH insulin. She again returns after a long absence. Sugars are stable, higher after dietary indiscretions. She occasionally skips insulin if sugars <130. I advised her to skip the dose only if CBG <100, OTH take 1/2 dose. - I advised her to:  Patient Instructions  Please continue: - Metformin 1000 mg 2x a day - Glipizide 10 mg 2x a day - NPH (Insulin N): - 25 units in am  - 20 units at bedtime  Please return in 3 months with your sugar log.   - advised her to continue checking her sugars at different times of the day - check 2 times a day, rotating checkso - Needs a new eye exam >> advised to schedule. - will check a Lipid level today - will check a new HbA1c today >> 6.5% (still good) - Return to clinic in 3 months with sugar log    2. Hypothyroidism - Reviewed the latest thyroid labs: normal 4 months ago - After we increased her levothyroxine dose to 200 at last visit, 8 months ago - discussed about taking the thyroid hormone every day, with water, >30 minutes before breakfast, separated by >4 hours from acid reflux medications, calcium, iron, multivitamins. She is taking it correctly - continue 200 mcg of Synthroid daily  3. Obesity grade 3 - She gained back the weight issue lost at the beginning of the year - discussed the need to start losing weight again  Philemon Kingdom, MD PhD [facility]

## 2016-08-30 NOTE — Patient Instructions (Addendum)
Please continue: - Metformin 1000 mg 2x a day - Glipizide 10 mg 2x a day - NPH (Insulin N): - 25 units in am  - 20 units at bedtime  Please return in 3 months with your sugar log.   Please stop at the lab.

## 2016-08-30 NOTE — Addendum Note (Signed)
Addended by: Caprice Beaver T on: 08/30/2016 01:38 PM   Modules accepted: Orders

## 2016-09-06 ENCOUNTER — Ambulatory Visit: Payer: Medicare Other | Attending: Internal Medicine

## 2016-09-06 DIAGNOSIS — G4733 Obstructive sleep apnea (adult) (pediatric): Secondary | ICD-10-CM | POA: Diagnosis not present

## 2016-09-12 DIAGNOSIS — G4733 Obstructive sleep apnea (adult) (pediatric): Secondary | ICD-10-CM | POA: Diagnosis not present

## 2016-09-14 ENCOUNTER — Telehealth: Payer: Self-pay | Admitting: *Deleted

## 2016-09-14 DIAGNOSIS — G4733 Obstructive sleep apnea (adult) (pediatric): Secondary | ICD-10-CM

## 2016-09-14 NOTE — Telephone Encounter (Signed)
Pt informed order will be placed to setup for a CPAP. Order placed. Nothing further needed.

## 2016-09-14 NOTE — Telephone Encounter (Signed)
-----   Message from Laverle Hobby, MD sent at 09/12/2016  1:06 PM EST ----- Titration study completed: Pt needs CPAP @ 11 with 1L of oxygen.

## 2016-09-19 ENCOUNTER — Ambulatory Visit: Payer: Medicare Other | Admitting: Internal Medicine

## 2016-09-21 ENCOUNTER — Telehealth: Payer: Self-pay | Admitting: Family Medicine

## 2016-09-21 NOTE — Telephone Encounter (Signed)
Spoke to pt. She has a lot of other things going on right and not able to schedule AWV/OV 30. Pt will call office back when she is ready.

## 2016-09-26 ENCOUNTER — Encounter: Payer: Self-pay | Admitting: Dietician

## 2016-09-26 ENCOUNTER — Encounter: Payer: Medicare Other | Attending: Family Medicine | Admitting: Dietician

## 2016-09-26 VITALS — Ht 64.0 in | Wt 372.0 lb

## 2016-09-26 DIAGNOSIS — G4730 Sleep apnea, unspecified: Secondary | ICD-10-CM | POA: Diagnosis not present

## 2016-09-26 DIAGNOSIS — E119 Type 2 diabetes mellitus without complications: Secondary | ICD-10-CM | POA: Diagnosis not present

## 2016-09-26 DIAGNOSIS — E785 Hyperlipidemia, unspecified: Secondary | ICD-10-CM | POA: Diagnosis not present

## 2016-09-26 DIAGNOSIS — I10 Essential (primary) hypertension: Secondary | ICD-10-CM | POA: Diagnosis not present

## 2016-09-26 DIAGNOSIS — E669 Obesity, unspecified: Secondary | ICD-10-CM | POA: Diagnosis not present

## 2016-09-26 DIAGNOSIS — E039 Hypothyroidism, unspecified: Secondary | ICD-10-CM | POA: Insufficient documentation

## 2016-09-26 DIAGNOSIS — Z713 Dietary counseling and surveillance: Secondary | ICD-10-CM | POA: Insufficient documentation

## 2016-09-26 DIAGNOSIS — IMO0001 Reserved for inherently not codable concepts without codable children: Secondary | ICD-10-CM

## 2016-09-26 NOTE — Progress Notes (Signed)
Medical Nutrition Therapy: Visit start time: [id number]  end time: 1450  Assessment:  Diagnosis: obesity Past medical history: Diabetes, HTN, HLD, sleep apnea, hypothyroidism  Psychosocial issues/ stress concerns: Patient reports moderate stress level Preferred learning method:  . Visual  Current weight: 372 (patient reported)  Height: 5'4" Medications, supplements: reviewed list in chart with patient  Progress and evaluation: Has tried seveal weight loss programs in the past including physicians wt loss and weight watchers, with only short term success.  She reports her weight has been stable for the past several months. She weighs monthly at the pain management clinic.   Physical activity: some chair exercises daily 10 minutes  Dietary Intake:  Usual eating pattern includes 2 meals and 2-3 snacks per day. Dining out frequency: 3 meals per week.  Breakfast: pancakes or oatmeal with toast; 12pm Snack: none Lunch: usually none, occasionally sandwich Snack: none Supper: roast or chicken with vegetables, often takeout pizza or burgers.  Snack: chips, ice cream other "junk" foods late at night.  Beverages: water, diet lemonade occasionally, rarely sweet tea  Nutrition Care Education: Topics covered: weight management Basic nutrition: basic food groups, appropriate nutrient balance, appropriate meal and snack schedule, general nutrition guidelines    Weight control: Guidance for 1400kcal meal plan for weight loss; importance of low fat and low sugar choices, controlling food portions and snacking, strategies for controlling appetite, benefits of tracking food intake; appropriate rate of weight loss.  Advanced nutrition: dining out Diabetes: appropriate meal and snack schedule, appropriate carb intake and balance  Nutritional Diagnosis:  [place]-3.3 Overweight/obesity As related to excess calories, inactivity, hypothyroid.  As evidenced by patient report.  Intervention: Instruction as noted  above.   Set goals with patient input.    Discussed possibility of needing weight loss medication or surgery to reach goal weight.   Education Materials given:  . Food lists/ Planning A Balanced Meal . Sample meal pattern/ menus . Goals/ instructions  Learner/ who was taught:  . Patient  . Spouse/ partner  Level of understanding: Marland Kitchen Verbalizes/ demonstrates competency  Demonstrated degree of understanding via:   Teach back Learning barriers: . None  Willingness to learn/ readiness for change: . Eager, change in progress  Monitoring and Evaluation:  Dietary intake, exercise, and body weight      follow up: 11/07/16

## 2016-09-26 NOTE — Patient Instructions (Signed)
   Start keeping a diary of everything you eat and drink, even just small amount or bite of something.   Plan to have a snack or lite meal such as a small sandwich about 12am. Make sure to include a protein food to help with fulness and stabilize blood sugar. Examples: 2-4Tbsp nuts or 1 cheese stick with fruit, small portion cheerios with nuts, Greek yogurt with frozen fruit.   Pre-portion snack foods; avoid eating from large container.

## 2016-10-05 ENCOUNTER — Encounter: Payer: Self-pay | Admitting: Internal Medicine

## 2016-10-05 ENCOUNTER — Ambulatory Visit (INDEPENDENT_AMBULATORY_CARE_PROVIDER_SITE_OTHER): Payer: Medicare Other | Admitting: Internal Medicine

## 2016-10-05 VITALS — BP 138/82 | HR 111 | Wt 380.0 lb

## 2016-10-05 DIAGNOSIS — I2510 Atherosclerotic heart disease of native coronary artery without angina pectoris: Secondary | ICD-10-CM

## 2016-10-05 DIAGNOSIS — J449 Chronic obstructive pulmonary disease, unspecified: Secondary | ICD-10-CM

## 2016-10-05 MED ORDER — BUDESONIDE 0.5 MG/2ML IN SUSP: 0.5000 mg | Freq: Two times a day (BID) | RESPIRATORY_TRACT | 12 refills | Status: DC

## 2016-10-05 NOTE — Progress Notes (Signed)
[facility] Consultation      Date: 10/05/2016,   MRN# [id number] Theresa Huffman [birth year] Code Status:  Code Status History    Date Active Date Inactive Code Status Order ID Comments User Context   06/02/2016  9:49 AM 06/02/2016  3:20 PM Full Code [id number]  Theresa Hampshire, MD Inpatient     Hosp day:@LENGTHOFSTAYDAYS @ Referring MD: @ATDPROV @     PCP:      AdmissionWeight: (!) 380 lb (172.4 kg)                 CurrentWeight: (!) 380 lb (172.4 kg) Theresa Huffman is a 70 y.o. old female seen in consultation for SOB at the request of Dr. Deborra Medina.     CHIEF COMPLAINT:   SOB   HISTORY OF PRESENT ILLNESS   70 yo morbidly obese white female seen today for follow SOB.   HER LAST OV-Has been going on for 8 months and has progressively worsened in the past 2 months Has worsened with exertion patient has associated intermittent wheezing with cough, also with associated headaches and dizziness. Upon further assessment, patient had dropped Oxygen sats to 84% with very mild exertion, patient immediately placed on oxygen    CURRENT OV-She was not actively in distress, patient has no signs of infection at this time Patient is nonsmoker, but has extensive second hand smoking history for approx 26  Years Started BREO last visit and she does not have the resp capacity to inihale CT chest 04/2016 shows MILD LAD ECHO 04/2016 shows grade 1 diastolic dysfunction SLeep study confrims sleep apnea     MEDICATIONS    Home Medication:  Current Outpatient Rx  . Order #: [id number] Class: Normal  . Order #: [id number] Class: Historical Med  . Order #: [id number] Class: Normal  . Order #: [id number] Class: Historical Med  . Order #: [id number] Class: Normal  . Order #: [id number] Class: Normal  . Order #: [id number] Class: Normal  . Order #: [id number] Class: Normal  . Order #: [id number] Class: Print  . Order #: [id number] Class: Normal  . Order #: [id number] Class: Print  . Order  #: [id number] Class: Normal  . Order #: [id number] Class: Print  . Order #: [id number] Class: Normal  . Order #: [id number] Class: Normal  . Order #: [id number] Class: Normal  . Order #: [id number] Class: Normal    Current Medication:  Current Outpatient Prescriptions:  .  amLODipine (NORVASC) 5 MG tablet, TAKE 1 TABLET BY MOUTH DAILY, Disp: 90 tablet, Rfl: 1 .  aspirin EC 81 MG tablet, Take 81 mg by mouth daily. , Disp: , Rfl:  .  baclofen (LIORESAL) 10 MG tablet, Take 1 tablet (10 mg total) by mouth 3 (three) times daily., Disp: 90 each, Rfl: 2 .  Calcium Carbonate-Vitamin D 600-400 MG-UNIT per tablet, Take 1 tablet by mouth daily. 1200mg  once daily, Disp: , Rfl:  .  fluticasone furoate-vilanterol (BREO ELLIPTA) 200-25 MCG/INH AEPB, Inhale 1 puff into the lungs daily., Disp: 60 each, Rfl: 5 .  furosemide (LASIX) 40 MG tablet, TAKE 1 TABLET BY MOUTH TWICE A DAY **NEED TO MAKE APPT FOR LABS AND AFOLLOW-UP!**, Disp: 60 tablet, Rfl: 0 .  gabapentin (NEURONTIN) 300 MG capsule, Take 2 capsules (600 mg total) by mouth 3 (three) times daily., Disp: 180 capsule, Rfl: 2 .  glipiZIDE (GLUCOTROL) 10 MG tablet, TAKE 1 TABLET BY MOUTH TWICE DAILY BEFORE A MEAL, Disp: 180 tablet, Rfl: 3 .  HYDROcodone-acetaminophen (NORCO) 10-325 MG tablet, Take 1 tablet by mouth 3 (three) times daily. As needed  for back or leg pain, Disp: 90 tablet, Rfl: 0 .  ibuprofen (ADVIL,MOTRIN) 800 MG tablet, Take 1 tablet (800 mg total) by mouth 3 (three) times daily., Disp: 90 tablet, Rfl: 3 .  Insulin NPH, Human,, Isophane, (HUMULIN N) 100 UNIT/ML Kiwkpen, Inject units 20 units before breakfast and 15 units before bedtime (Patient taking differently: Inject units 25 units before breakfast and 20 units before bedtime), Disp: 15 mL, Rfl: 2 .  metFORMIN (GLUCOPHAGE) 1000 MG tablet, Take 1 tablet (1,000 mg total) by mouth 2 (two) times daily with a meal., Disp: 60 tablet, Rfl: 11 .  morphine (MS CONTIN) 30 MG 12 hr tablet, Take 1 tablet (30 mg  total) by mouth every 12 (twelve) hours., Disp: 60 tablet, Rfl: 0 .  potassium chloride SA (K-DUR,KLOR-CON) 20 MEQ tablet, TAKE 1 TABLET BY MOUTH DAILY, Disp: 90 tablet, Rfl: 1 .  simvastatin (ZOCOR) 20 MG tablet, Take 1 tablet (20 mg total) by mouth daily. Needs appointment for labs and a follow up for additional refills., Disp: 30 tablet, Rfl: 3 .  SYNTHROID 200 MCG tablet, TAKE ONE TABLET BY MOUTH EVERY MORNING BEFORE BREAKFAST *NEED APPT FOR FURTHER REFILLS!*, Disp: 30 tablet, Rfl: 3 .  venlafaxine (EFFEXOR) 75 MG tablet, Take three tablets by mouth every night at bedtime (Patient taking differently: Take 225 mg by mouth at bedtime. ), Disp: 90 tablet, Rfl: 3    ALLERGIES   Ace inhibitors; Erythromycin; Invokana [canagliflozin]; and Other     REVIEW OF SYSTEMS   Review of Systems  Constitutional: Positive for malaise/fatigue. Negative for chills, diaphoresis, fever and weight loss.  HENT: Negative for congestion and hearing loss.   Eyes: Negative for blurred vision and double vision.  Respiratory: Positive for shortness of breath. Negative for cough, hemoptysis, sputum production and wheezing.   Cardiovascular: Positive for leg swelling. Negative for chest pain, palpitations and orthopnea.  Gastrointestinal: Negative for abdominal pain, heartburn, nausea and vomiting.  Genitourinary: Negative for dysuria.  Skin: Negative for rash.  Neurological: Negative for weakness and headaches.  All other systems reviewed and are negative.    VS: BP 138/82 (BP Location: Right Arm, Cuff Size: Normal)   Pulse (!) 111   Wt (!) 380 lb (172.4 kg)   SpO2 91%   BMI 65.23 kg/m      PHYSICAL EXAM  Physical Exam  Constitutional: She is oriented to person, place, and time. She appears well-developed and well-nourished. No distress.  Eyes: No scleral icterus.  Neck: Neck supple.  Cardiovascular: Normal rate, regular rhythm and normal heart sounds.   No murmur heard. Pulmonary/Chest: Effort  normal and breath sounds normal. No stridor. No respiratory distress. She has no wheezes.  Musculoskeletal: Normal range of motion. She exhibits edema.  Neurological: She is alert and oriented to person, place, and time. No cranial nerve deficit.  Skin: Skin is warm. She is not diaphoretic.  Psychiatric: She has a normal mood and affect.         IMAGING   CT chest 04/2016 images reviewed 10/05/2016 B/l interstitial infiltrates with mild LAD    ASSESSMENT/PLAN   70 yo morbidly obese white female with SOB is now with newly dx of chronic hypoxic resp failure-etiology is multifactorial including her morbid obesity with probable underlying OSA/OHS with reactive airways disease probable COPD from second hand smoke exposure in the setting of Grade 1 Diastolic cardiac dysfunction  1.patient will need oxygen with exertion and as needed 2.sleep study confirms OSA-willstart therapy next week 3.stop  breo and start Pulmicort Neb therapy 4.albuterol as needed 5.continue lasix as prescribed  Follow up in 3 months    The Patient requires high complexity decision making for assessment and support, frequent evaluation and titration of therapies, application of advanced monitoring technologies and extensive interpretation of multiple databases.    Patient/Family are satisfied with Plan of action and management. All questions answered  Corrin Parker, M.D.  [facility]  Medical Director [facility] Director [facility] Cardio-Pulmonary Department

## 2016-10-05 NOTE — Patient Instructions (Signed)
STOP BREO START PULMICORT NEBS

## 2016-10-07 ENCOUNTER — Other Ambulatory Visit: Payer: Self-pay

## 2016-10-07 ENCOUNTER — Telehealth: Payer: Self-pay | Admitting: Internal Medicine

## 2016-10-07 MED ORDER — BUDESONIDE 0.5 MG/2ML IN SUSP: 0.5000 mg | Freq: Two times a day (BID) | RESPIRATORY_TRACT | 12 refills | Status: DC

## 2016-10-07 NOTE — Telephone Encounter (Signed)
A previous rx was sent to a different pharmacy but because of the pt. Insurance she had to have this filled at [facility] with a dx code on it. The new rx was sent and pharmacy was made aware. The pt.  Was made aware as well

## 2016-10-07 NOTE — Telephone Encounter (Signed)
[facility] [place] [facility] can [facility] in a new prescription Budesonide along With ICD 10 dx code  Pt would like to change pharmacies  Please advise.

## 2016-10-07 NOTE — Telephone Encounter (Signed)
Nothing further is needed at this time.  

## 2016-10-10 ENCOUNTER — Other Ambulatory Visit: Payer: Self-pay | Admitting: Family Medicine

## 2016-10-11 ENCOUNTER — Ambulatory Visit: Payer: Medicare Other | Admitting: Physical Medicine & Rehabilitation

## 2016-10-13 ENCOUNTER — Encounter: Payer: Medicare Other | Attending: Physical Medicine & Rehabilitation | Admitting: Registered Nurse

## 2016-10-13 ENCOUNTER — Encounter: Payer: Self-pay | Admitting: Registered Nurse

## 2016-10-13 VITALS — BP 134/76 | HR 89 | Resp 14

## 2016-10-13 DIAGNOSIS — M25562 Pain in left knee: Secondary | ICD-10-CM

## 2016-10-13 DIAGNOSIS — G8929 Other chronic pain: Secondary | ICD-10-CM

## 2016-10-13 DIAGNOSIS — M75102 Unspecified rotator cuff tear or rupture of left shoulder, not specified as traumatic: Secondary | ICD-10-CM | POA: Insufficient documentation

## 2016-10-13 DIAGNOSIS — I2510 Atherosclerotic heart disease of native coronary artery without angina pectoris: Secondary | ICD-10-CM

## 2016-10-13 DIAGNOSIS — Z5181 Encounter for therapeutic drug level monitoring: Secondary | ICD-10-CM

## 2016-10-13 DIAGNOSIS — M7542 Impingement syndrome of left shoulder: Secondary | ICD-10-CM

## 2016-10-13 DIAGNOSIS — G894 Chronic pain syndrome: Secondary | ICD-10-CM

## 2016-10-13 DIAGNOSIS — Z79899 Other long term (current) drug therapy: Secondary | ICD-10-CM

## 2016-10-13 DIAGNOSIS — M25561 Pain in right knee: Secondary | ICD-10-CM | POA: Diagnosis not present

## 2016-10-13 DIAGNOSIS — M47817 Spondylosis without myelopathy or radiculopathy, lumbosacral region: Secondary | ICD-10-CM | POA: Diagnosis not present

## 2016-10-13 DIAGNOSIS — M7541 Impingement syndrome of right shoulder: Secondary | ICD-10-CM | POA: Insufficient documentation

## 2016-10-13 MED ORDER — MORPHINE SULFATE ER 30 MG PO TBCR: 30.0000 mg | EXTENDED_RELEASE_TABLET | Freq: Two times a day (BID) | ORAL | 0 refills | Status: DC

## 2016-10-13 MED ORDER — HYDROCODONE-ACETAMINOPHEN 10-325 MG PO TABS: 1.0000 | ORAL_TABLET | Freq: Three times a day (TID) | ORAL | 0 refills | Status: DC

## 2016-10-13 NOTE — Progress Notes (Signed)
Subjective:    Patient ID: Theresa Huffman, female    DOB: [birth year], 70 y.o.   MRN: [id number]  HPI:  Theresa Huffman is a 70 year old female who returns for follow up appointment for chronic pain and medication refill. She states her pain is located in her right shoulder, lower back and rightknee. She rates her pain 7. Her current exercise regime is walking short distances using loftstrand crutches in her home and performing chair exercises.  Husband in room.   Pain Inventory Average Pain 7 Pain Right Now 7 My pain is burning and aching  In the last 24 hours, has pain interfered with the following? General activity 8 Relation with others 5 Enjoyment of life 7 What TIME of day is your pain at its worst? morning, evening  Sleep (in general) Poor  Pain is worse with: sitting and inactivity Pain improves with: medication Relief from Meds: 7  Mobility walk with assistance how many minutes can you walk? 10 ability to climb steps?  no do you drive?  yes use a wheelchair transfers alone Do you have any goals in this area?  yes  Function disabled: date disabled . I need assistance with the following:  meal prep, household duties and shopping Do you have any goals in this area?  yes  Neuro/Psych weakness trouble walking depression  Prior Studies Any changes since last visit?  no  Physicians involved in your care Any changes since last visit?  no   Family History  Problem Relation Age of Onset  . Cancer Mother     lung and colon  . Stroke Sister    Social History   Social History  . Marital status: Married    Spouse name: N/A  . Number of children: 1  . Years of education: N/A   Occupational History  . Diability Unemployed   Social History Main Topics  . Smoking status: Never Smoker  . Smokeless tobacco: Never Used  . Alcohol use No  . Drug use: No  . Sexual activity: Not Asked   Other Topics Concern  . None   Social History  Narrative   Lives in [place], one adult child      Caffeine use: coffee daily in am   Past Surgical History:  Procedure Laterality Date  . CARDIAC CATHETERIZATION N/A 06/02/2016   Procedure: Left Heart Cath and Coronary Angiography;  Surgeon: [name], MD;  Location: [facility];  Service: Cardiovascular;  Laterality: N/A;  . CHOLECYSTECTOMY    . TUBAL LIGATION     Past Medical History:  Diagnosis Date  . CAD (coronary artery disease)    a. Lexiscan 05/12/16: smal defect of moderate severity in the mid anterior and apical location opartially reversible at rest. EF 42%. TID ratio uincreased at 1.23 possibly c/w balanced ischemia. Frequent PVCs  . Diabetes mellitus type 2 in obese ([place])   . Essential hypertension   . Facet syndrome, lumbar   . GERD (gastroesophageal reflux disease)   . Hyperlipidemia   . Hypothyroidism   . Low back pain   . Lumbago   . Lumbosacral spondylosis without myelopathy   . Lymphedema   . Lymphedema   . Morbid obesity ([place])   . Pain in limb   . Primary localized osteoarthrosis, lower leg    BP 134/76   Pulse 89   Resp 14   SpO2 90%   Opioid Risk Score:   Fall Risk Score:  `1  Depression screen PHQ 2/9  Depression screen [facility] 2/9 09/26/2016 04/20/2016 01/26/2016 11/24/2015 07/16/2015  Decreased Interest 0 0 2 0 0  Down, Depressed, Hopeless 3 0 1 1 1   PHQ - 2 Score 3 0 3 1 1   Altered sleeping 0 - 3 - 1  Tired, decreased energy 3 - 3 - 1  Change in appetite 3 - 2 - 1  Feeling bad or failure about yourself  2 - 2 - 1  Trouble concentrating 0 - 0 - 0  Moving slowly or fidgety/restless 3 - 0 - 0  Suicidal thoughts 0 - 0 - 0  PHQ-9 Score 14 - 13 - 5  Difficult doing work/chores Not difficult at all - Somewhat difficult - Somewhat difficult  Some recent data might be hidden    Review of Systems  HENT: Negative.   Eyes: Negative.   Respiratory: Positive for apnea, cough, shortness of breath and wheezing.   Cardiovascular:  Positive for leg swelling.  Endocrine:       Diabetic  Genitourinary: Negative.   Musculoskeletal: Positive for arthralgias, back pain, gait problem, joint swelling, myalgias, neck pain and neck stiffness.  Skin: Negative.   Neurological: Positive for weakness.  Hematological: Negative.   Psychiatric/Behavioral: Positive for dysphoric mood.       Objective:   Physical Exam  Constitutional: She is oriented to person, place, and time. She appears well-developed and well-nourished.  HENT:  Head: Normocephalic and atraumatic.  Neck: Normal range of motion. Neck supple.  Cardiovascular: Normal rate and regular rhythm.   Pulmonary/Chest: Effort normal and breath sounds normal.  Continuous Oxygen 1 Liter nasal cannula  Musculoskeletal:  Normal Muscle Bulk and Muscle Testing Reveals:  Upper Extremities: Full ROM and Muscle Strength 5/5 Lumbar Paraspinal Tenderness: L-3-L-5 Lower Extremities: Full ROM and Muscle Strength 5/5 Arrived in Motorized Wheelchair  Neurological: She is alert and oriented to person, place, and time.  Skin: Skin is warm and dry.  Psychiatric: She has a normal mood and affect.  Nursing note and vitals reviewed.         Assessment & Plan:  1. Lumbar pain chronic, no radiculopathy: Lumbar degenerative disc L4-5 and L5-S1.  Refilled: HYDRcodone 10/325mg  one tablet TID #90.  MS CONTIN 30 mg one every 12 hours #60.  We will continue the opioid monitoring program,this consists of regular clinic visits, examinations, urine drug screen, pill counts as well as use of [place] Controlled Substance reporting System. Continue Gabapentin. 2. Bilateral knee osteoarthritis: Continue with Exercise and Heat therapy. Continue current medication regime.  3. Morbid Obesity: Continue with Diabetic Diet Regime and Exercise Regimen. 4. Muscle Spasms: Continue Baclofen  20 minutes of face to face patient care time was spent during this visit. All questions were  encouraged and answered.   F/U in 1 month

## 2016-10-14 ENCOUNTER — Encounter: Payer: Self-pay | Admitting: Internal Medicine

## 2016-10-14 ENCOUNTER — Ambulatory Visit (INDEPENDENT_AMBULATORY_CARE_PROVIDER_SITE_OTHER): Payer: Medicare Other | Admitting: Internal Medicine

## 2016-10-14 VITALS — BP 138/76 | HR 106 | Temp 98.1°F | Wt 376.0 lb

## 2016-10-14 DIAGNOSIS — J441 Chronic obstructive pulmonary disease with (acute) exacerbation: Secondary | ICD-10-CM

## 2016-10-14 DIAGNOSIS — I2510 Atherosclerotic heart disease of native coronary artery without angina pectoris: Secondary | ICD-10-CM | POA: Diagnosis not present

## 2016-10-14 MED ORDER — AZITHROMYCIN 250 MG PO TABS: ORAL_TABLET | ORAL | 0 refills | Status: DC

## 2016-10-14 NOTE — Progress Notes (Signed)
HPI  Pt presents to the clinic today with c/o runny nose, sore throat and cough. This started 1 week ago. She is blowing clear mucous out of her nose. She denies difficulty swallowing. The cough is productive of green mucous. She has run fevers up to 100.0. She has not taken any OTC meds but has been using her nebulizer machine without relief. She has a history of DM 2, last A1C 6.5%, COPD, but she has not been using her inhalers. She is on continuous oxygen and reports her oxygen levels have been fine.  She is UTD with flu and pneumonia vaccines. She has had sick contacts.  Review of Systems        Past Medical History:  Diagnosis Date  . CAD (coronary artery disease)    a. Lexiscan 05/12/16: smal defect of moderate severity in the mid anterior and apical location opartially reversible at rest. EF 42%. TID ratio uincreased at 1.23 possibly c/w balanced ischemia. Frequent PVCs  . Diabetes mellitus type 2 in obese ([place])   . Essential hypertension   . Facet syndrome, lumbar   . GERD (gastroesophageal reflux disease)   . Hyperlipidemia   . Hypothyroidism   . Low back pain   . Lumbago   . Lumbosacral spondylosis without myelopathy   . Lymphedema   . Lymphedema   . Morbid obesity ([place])   . Pain in limb   . Primary localized osteoarthrosis, lower leg     Family History  Problem Relation Age of Onset  . Cancer Mother     lung and colon  . Stroke Sister     Social History   Social History  . Marital status: Married    Spouse name: N/A  . Number of children: 1  . Years of education: N/A   Occupational History  . Diability Unemployed   Social History Main Topics  . Smoking status: Never Smoker  . Smokeless tobacco: Never Used  . Alcohol use No  . Drug use: No  . Sexual activity: Not on file   Other Topics Concern  . Not on file   Social History Narrative   Lives in [place], one adult child      Caffeine use: coffee daily in am    Allergies  Allergen Reactions   . Ace Inhibitors Other (See Comments)    Angioedema   . Erythromycin Nausea And Vomiting  . Invokana [Canagliflozin] Other (See Comments)    Yeast infection  . Other Rash and Other (See Comments)    Chalky drink     Constitutional: Positive fatigue and fever. Denies headache or abrupt weight changes.  HEENT:  Positive runny nose, sore throat. Denies eye redness, eye pain, pressure behind the eyes, facial pain, nasal congestion, ear pain, ringing in the ears, wax buildup, or bloody nose. Respiratory: Positive cough and shortness of breath. Denies difficulty breathing.  Cardiovascular: Denies chest pain, chest tightness, palpitations or swelling in the hands or feet.   No other specific complaints in a complete review of systems (except as listed in HPI above).  Objective:   BP 138/76   Pulse (!) 106   Temp 98.1 F (36.7 C) (Oral)   Wt (!) 376 lb (170.6 kg)   SpO2 97%   BMI 64.54 kg/m   Wt Readings from Last 3 Encounters:  10/14/16 (!) 376 lb (170.6 kg)  10/05/16 (!) 380 lb (172.4 kg)  09/26/16 (!) 372 lb (168.7 kg)     General: Appears her stated age,  chronically ill appearing, in NAD. HEENT: Head: normal shape and size; Throat/Mouth: Teeth present, mucosa erythematous and moist, no exudate noted, no lesions or ulcerations noted.  Neck: No cervical lymphadenopathy.  Cardiovascular: Normal rate and rhythm. Pulmonary/Chest: Normal effort and fine rhonchi noted in bilateral bases. No respiratory distress. No wheezes, rales noted.       Assessment & Plan:   COPD exacerbation:  Get some rest and drink plenty of water Do salt water gargles for the sore throat eRx for Azithromax x 5 days Continue Pulmicort nebs for now Delsym as needed for cough  RTC as needed or if symptoms persist.   Webb Silversmith, NP

## 2016-10-14 NOTE — Patient Instructions (Signed)
Cough, Adult Introduction A cough helps to clear your throat and lungs. A cough may last only 2-3 weeks (acute), or it may last longer than 8 weeks (chronic). Many different things can cause a cough. A cough may be a sign of an illness or another medical condition. Follow these instructions at home:  Pay attention to any changes in your cough.  Take medicines only as told by your doctor.  If you were prescribed an antibiotic medicine, take it as told by your doctor. Do not stop taking it even if you start to feel better.  Talk with your doctor before you try using a cough medicine.  Drink enough fluid to keep your pee (urine) clear or pale yellow.  If the air is dry, use a cold steam vaporizer or humidifier in your home.  Stay away from things that make you cough at work or at home.  If your cough is worse at night, try using extra pillows to raise your head up higher while you sleep.  Do not smoke, and try not to be around smoke. If you need help quitting, ask your doctor.  Do not have caffeine.  Do not drink alcohol.  Rest as needed. Contact a doctor if:  You have new problems (symptoms).  You cough up yellow fluid (pus).  Your cough does not get better after 2-3 weeks, or your cough gets worse.  Medicine does not help your cough and you are not sleeping well.  You have pain that gets worse or pain that is not helped with medicine.  You have a fever.  You are losing weight and you do not know why.  You have night sweats. Get help right away if:  You cough up blood.  You have trouble breathing.  Your heartbeat is very fast. This information is not intended to replace advice given to you by your health care provider. Make sure you discuss any questions you have with your health care provider. Document Released: 06/30/2011 Document Revised: 03/24/2016 Document Reviewed: 12/24/2014  2017 Elsevier  

## 2016-11-02 ENCOUNTER — Other Ambulatory Visit: Payer: Self-pay | Admitting: Physical Medicine & Rehabilitation

## 2016-11-07 ENCOUNTER — Ambulatory Visit: Payer: Medicare Other | Admitting: Dietician

## 2016-11-09 ENCOUNTER — Encounter: Payer: Self-pay | Admitting: Dietician

## 2016-11-09 NOTE — Progress Notes (Signed)
Called patient to reschedule appointment which she missed on 11/07/16. She states she is unable to reschedule right now as she has "too much going on", but will reschedule at a later time if able. Sent discharge letter to MD.

## 2016-11-10 ENCOUNTER — Encounter: Payer: Medicare Other | Attending: Physical Medicine & Rehabilitation | Admitting: Registered Nurse

## 2016-11-10 ENCOUNTER — Encounter: Payer: Self-pay | Admitting: Registered Nurse

## 2016-11-10 VITALS — BP 144/81 | HR 88

## 2016-11-10 DIAGNOSIS — M25561 Pain in right knee: Secondary | ICD-10-CM | POA: Diagnosis not present

## 2016-11-10 DIAGNOSIS — M47817 Spondylosis without myelopathy or radiculopathy, lumbosacral region: Secondary | ICD-10-CM

## 2016-11-10 DIAGNOSIS — M7541 Impingement syndrome of right shoulder: Secondary | ICD-10-CM | POA: Insufficient documentation

## 2016-11-10 DIAGNOSIS — M25562 Pain in left knee: Secondary | ICD-10-CM | POA: Diagnosis not present

## 2016-11-10 DIAGNOSIS — G8929 Other chronic pain: Secondary | ICD-10-CM | POA: Diagnosis not present

## 2016-11-10 DIAGNOSIS — Z5181 Encounter for therapeutic drug level monitoring: Secondary | ICD-10-CM | POA: Insufficient documentation

## 2016-11-10 DIAGNOSIS — M25511 Pain in right shoulder: Secondary | ICD-10-CM

## 2016-11-10 DIAGNOSIS — G894 Chronic pain syndrome: Secondary | ICD-10-CM | POA: Diagnosis not present

## 2016-11-10 DIAGNOSIS — M75102 Unspecified rotator cuff tear or rupture of left shoulder, not specified as traumatic: Secondary | ICD-10-CM | POA: Insufficient documentation

## 2016-11-10 DIAGNOSIS — Z79899 Other long term (current) drug therapy: Secondary | ICD-10-CM | POA: Insufficient documentation

## 2016-11-10 MED ORDER — HYDROCODONE-ACETAMINOPHEN 10-325 MG PO TABS: 1.0000 | ORAL_TABLET | Freq: Three times a day (TID) | ORAL | 0 refills | Status: DC

## 2016-11-10 MED ORDER — MORPHINE SULFATE ER 30 MG PO TBCR: 30.0000 mg | EXTENDED_RELEASE_TABLET | Freq: Two times a day (BID) | ORAL | 0 refills | Status: DC

## 2016-11-10 NOTE — Progress Notes (Signed)
Subjective:    Patient ID: Theresa Huffman, female    DOB: [birth year], 71 y.o.   MRN: [id number]  HPI: [name] is a 71 year old female who returns for follow up appointment for chronic pain and medication refill. She states her pain is located in her right shoulder, lower back and rightknee. She rates her pain 7. Her current exercise regime is walking short distances using loftstrand crutches in her home and performing chair exercises.  Husband in room.  Pain Inventory Average Pain 7 Pain Right Now 7 My pain is burning and aching  In the last 24 hours, has pain interfered with the following? General activity 8 Relation with others 6 Enjoyment of life 7 What TIME of day is your pain at its worst? morning Sleep (in general) .  Pain is worse with: . Pain improves with: . Relief from Meds: .  Mobility walk with assistance ability to climb steps?  no do you drive?  yes use a wheelchair transfers alone  Function disabled: date disabled . I need assistance with the following:  meal prep, household duties and shopping  Neuro/Psych weakness trouble walking depression  Prior Studies Any changes since last visit?  no  Physicians involved in your care Any changes since last visit?  no   Family History  Problem Relation Age of Onset  . Cancer Mother     lung and colon  . Stroke Sister    Social History   Social History  . Marital status: Married    Spouse name: N/A  . Number of children: 1  . Years of education: N/A   Occupational History  . Diability Unemployed   Social History Main Topics  . Smoking status: Never Smoker  . Smokeless tobacco: Never Used  . Alcohol use No  . Drug use: No  . Sexual activity: Not on file   Other Topics Concern  . Not on file   Social History Narrative   Lives in [place], one adult child      Caffeine use: coffee daily in am   Past Surgical History:  Procedure Laterality Date  . CARDIAC  CATHETERIZATION N/A 06/02/2016   Procedure: Left Heart Cath and Coronary Angiography;  Surgeon: [name], MD;  Location: [facility];  Service: Cardiovascular;  Laterality: N/A;  . CHOLECYSTECTOMY    . TUBAL LIGATION     Past Medical History:  Diagnosis Date  . CAD (coronary artery disease)    a. Lexiscan 05/12/16: smal defect of moderate severity in the mid anterior and apical location opartially reversible at rest. EF 42%. TID ratio uincreased at 1.23 possibly c/w balanced ischemia. Frequent PVCs  . Diabetes mellitus type 2 in obese ([place])   . Essential hypertension   . Facet syndrome, lumbar   . GERD (gastroesophageal reflux disease)   . Hyperlipidemia   . Hypothyroidism   . Low back pain   . Lumbago   . Lumbosacral spondylosis without myelopathy   . Lymphedema   . Lymphedema   . Morbid obesity ([place])   . Pain in limb   . Primary localized osteoarthrosis, lower leg    There were no vitals taken for this visit.  Opioid Risk Score:   Fall Risk Score:  `1  Depression screen PHQ 2/9  Depression screen [facility] 2/9 09/26/2016 04/20/2016 01/26/2016 11/24/2015 07/16/2015  Decreased Interest 0 0 2 0 0  Down, Depressed, Hopeless 3 0 1 1 1   PHQ - 2 Score  3 0 3 1 1   Altered sleeping 0 - 3 - 1  Tired, decreased energy 3 - 3 - 1  Change in appetite 3 - 2 - 1  Feeling bad or failure about yourself  2 - 2 - 1  Trouble concentrating 0 - 0 - 0  Moving slowly or fidgety/restless 3 - 0 - 0  Suicidal thoughts 0 - 0 - 0  PHQ-9 Score 14 - 13 - 5  Difficult doing work/chores Not difficult at all - Somewhat difficult - Somewhat difficult  Some recent data might be hidden   Review of Systems  HENT: Negative.   Eyes: Negative.   Respiratory: Negative.   Cardiovascular: Negative.   Gastrointestinal: Negative.   Endocrine: Negative.   Genitourinary: Negative.   Musculoskeletal: Positive for gait problem and joint swelling.  Skin: Negative.   Allergic/Immunologic: Negative.     Neurological: Positive for weakness.  Hematological: Negative.   Psychiatric/Behavioral: Positive for dysphoric mood.  All other systems reviewed and are negative.      Objective:   Physical Exam  Constitutional: She is oriented to person, place, and time. She appears well-developed and well-nourished.  HENT:  Head: Normocephalic and atraumatic.  Neck: Normal range of motion. Neck supple.  Cardiovascular: Normal rate and regular rhythm.   Pulmonary/Chest: Effort normal and breath sounds normal.  Continuous Oxygen at 2 liters nasal cannula  Musculoskeletal:  Normal Muscle Bulk and Muscle Testing Reveals: Upper Extremities: Full ROM and Muscle Strength 5/5 Back without spinal tenderness noted Lower Extremities: Full ROM and Muscle Strength 5/5 Arrived in Motorized wheelchair  Neurological: She is alert and oriented to person, place, and time.  Skin: Skin is warm and dry.  Psychiatric: She has a normal mood and affect.  Nursing note and vitals reviewed.         Assessment & Plan:  1. Lumbar pain chronic, no radiculopathy: Lumbar degenerative disc L4-5 and L5-S1.  Refilled: HYDRcodone 10/325mg  one tablet TID #90.  MS CONTIN 30 mg one every 12 hours #60.  We will continue the opioid monitoring program,this consists of regular clinic visits, examinations, urine drug screen, pill counts as well as use of [place] Controlled Substance reporting System. Continue Gabapentin. 2. Bilateral knee osteoarthritis: Continue with Exercise and Heat therapy. Continue current medication regime.  3. Morbid Obesity: Continue with Diabetic Diet Regime and Exercise Regimen. 4. Muscle Spasms: Continue Baclofen  20 minutes of face to face patient care time was spent during this visit. All questions were encouraged and answered.   F/U in 1 month

## 2016-11-19 ENCOUNTER — Encounter: Payer: Self-pay | Admitting: Internal Medicine

## 2016-11-19 ENCOUNTER — Other Ambulatory Visit: Payer: Self-pay | Admitting: Family Medicine

## 2016-11-19 DIAGNOSIS — J9620 Acute and chronic respiratory failure, unspecified whether with hypoxia or hypercapnia: Secondary | ICD-10-CM | POA: Diagnosis not present

## 2016-11-19 DIAGNOSIS — J449 Chronic obstructive pulmonary disease, unspecified: Secondary | ICD-10-CM

## 2016-11-24 ENCOUNTER — Ambulatory Visit (INDEPENDENT_AMBULATORY_CARE_PROVIDER_SITE_OTHER): Payer: Medicare Other | Admitting: Family Medicine

## 2016-11-24 ENCOUNTER — Encounter: Payer: Self-pay | Admitting: Family Medicine

## 2016-11-24 VITALS — BP 174/82 | HR 131 | Temp 98.2°F | Wt 378.5 lb

## 2016-11-24 DIAGNOSIS — J441 Chronic obstructive pulmonary disease with (acute) exacerbation: Secondary | ICD-10-CM

## 2016-11-24 MED ORDER — AZITHROMYCIN 250 MG PO TABS: ORAL_TABLET | ORAL | 0 refills | Status: DC

## 2016-11-24 NOTE — Assessment & Plan Note (Signed)
New- with transiently elevated BP.  She is asymptomatic and this tends to occur when she exerts herself by walking here today- she is essentially sedentary. Zpack, continue nebs and inhalers. Monitor BP at home. The patient indicates understanding of these issues and agrees with the plan.

## 2016-11-24 NOTE — Addendum Note (Signed)
Addended by: Jearld Fenton on: 11/24/2016 01:27 PM   Modules accepted: Orders

## 2016-11-24 NOTE — Progress Notes (Addendum)
Subjective:   Patient ID: KACEY KAMADA, female    DOB: 1946-08-20, 71 y.o.   MRN: [id number]  EEVA BRANDT is a pleasant 71 y.o. year old female who presents to clinic today with Cough and Nasal Congestion  on 11/24/2016  HPI:  Presents to clinic with c/o runny nose, sore throat and cough that has progressively worsened over the past week.   The cough is productive of green mucous. She has run fevers up to 100.0.   She has not taken any OTC meds but has been using her nebulizer machine without relief.   She is on continuous oxygen and reports she had to increase her O2 to 3 L because she was feeling SOB last night.    She is UTD with flu and pneumonia vaccines. She has had sick contacts.  Current Outpatient Prescriptions on File Prior to Visit  Medication Sig Dispense Refill  . amLODipine (NORVASC) 5 MG tablet TAKE 1 TABLET BY MOUTH DAILY 90 tablet 1  . aspirin EC 81 MG tablet Take 81 mg by mouth daily.     . baclofen (LIORESAL) 10 MG tablet TAKE ONE (1) TABLET BY MOUTH THREE (3) TIMES EACH DAY 90 each 1  . budesonide (PULMICORT) 0.5 MG/2ML nebulizer solution Take 2 mLs (0.5 mg total) by nebulization 2 (two) times daily. 120 mL 12  . Calcium Carbonate-Vitamin D 600-400 MG-UNIT per tablet Take 1 tablet by mouth daily. 1200mg  once daily    . furosemide (LASIX) 40 MG tablet TAKE 1 TABLET BY MOUTH TWICE A DAY *NEEDOFFICE VISIT FOR LABS AND FOLLOW UP* 60 tablet 0  . gabapentin (NEURONTIN) 300 MG capsule TAKE TWO CAPSULES BY MOUTH 3 TIMES DAILY 180 capsule 1  . glipiZIDE (GLUCOTROL) 10 MG tablet TAKE 1 TABLET BY MOUTH TWICE DAILY BEFORE A MEAL 180 tablet 3  . HYDROcodone-acetaminophen (NORCO) 10-325 MG tablet Take 1 tablet by mouth 3 (three) times daily. As needed for back or leg pain 90 tablet 0  . ibuprofen (ADVIL,MOTRIN) 800 MG tablet Take 1 tablet (800 mg total) by mouth 3 (three) times daily. 90 tablet 3  . Insulin NPH, Human,, Isophane, (HUMULIN N) 100 UNIT/ML Kiwkpen  Inject units 20 units before breakfast and 15 units before bedtime (Patient taking differently: Inject units 25 units before breakfast and 20 units before bedtime) 15 mL 2  . metFORMIN (GLUCOPHAGE) 1000 MG tablet Take 1 tablet (1,000 mg total) by mouth 2 (two) times daily with a meal. 60 tablet 11  . morphine (MS CONTIN) 30 MG 12 hr tablet Take 1 tablet (30 mg total) by mouth every 12 (twelve) hours. 60 tablet 0  . potassium chloride SA (K-DUR,KLOR-CON) 20 MEQ tablet TAKE 1 TABLET BY MOUTH DAILY 90 tablet 1  . simvastatin (ZOCOR) 20 MG tablet Take 1 tablet (20 mg total) by mouth daily. Needs appointment for labs and a follow up for additional refills. 30 tablet 3  . SYNTHROID 200 MCG tablet TAKE ONE TABLET BY MOUTH EVERY MORNING BEFORE BREAKFAST *NEED APPT FOR FURTHER REFILLS!* 30 tablet 3  . venlafaxine (EFFEXOR) 75 MG tablet TAKE THREE TABLETS BY MOUTH EVERY NIGHT AT BEDTIME 90 tablet 1   No current facility-administered medications on file prior to visit.     Allergies  Allergen Reactions  . Ace Inhibitors Other (See Comments)    Angioedema   . Erythromycin Nausea And Vomiting  . Invokana [Canagliflozin] Other (See Comments)    Yeast infection  . Other Rash and Other (See  Comments)    Chalky drink    Past Medical History:  Diagnosis Date  . CAD (coronary artery disease)    a. Lexiscan 05/12/16: smal defect of moderate severity in the mid anterior and apical location opartially reversible at rest. EF 42%. TID ratio uincreased at 1.23 possibly c/w balanced ischemia. Frequent PVCs  . Diabetes mellitus type 2 in obese ([place])   . Essential hypertension   . Facet syndrome, lumbar   . GERD (gastroesophageal reflux disease)   . Hyperlipidemia   . Hypothyroidism   . Low back pain   . Lumbago   . Lumbosacral spondylosis without myelopathy   . Lymphedema   . Lymphedema   . Morbid obesity ([place])   . Pain in limb   . Primary localized osteoarthrosis, lower leg     Past Surgical History:   Procedure Laterality Date  . CARDIAC CATHETERIZATION N/A 06/02/2016   Procedure: Left Heart Cath and Coronary Angiography;  Surgeon: [name], MD;  Location: [facility];  Service: Cardiovascular;  Laterality: N/A;  . CHOLECYSTECTOMY    . TUBAL LIGATION      Family History  Problem Relation Age of Onset  . Cancer Mother     lung and colon  . Stroke Sister     Social History   Social History  . Marital status: Married    Spouse name: N/A  . Number of children: 1  . Years of education: N/A   Occupational History  . Diability Unemployed   Social History Main Topics  . Smoking status: Never Smoker  . Smokeless tobacco: Never Used  . Alcohol use No  . Drug use: No  . Sexual activity: Not on file   Other Topics Concern  . Not on file   Social History Narrative   Lives in [place], one adult child      Caffeine use: coffee daily in am   The PMH, PSH, Social History, Family History, Medications, and allergies have been reviewed in [facility], and have been updated if relevant.  Review of Systems  Constitutional: Positive for fatigue and fever.  HENT: Positive for congestion.   Respiratory: Positive for cough.   Cardiovascular: Negative.   Gastrointestinal: Negative.   Musculoskeletal: Negative.   Allergic/Immunologic: Negative.   Neurological: Negative.   Hematological: Negative.   Psychiatric/Behavioral: Negative.   All other systems reviewed and are negative.      Objective:    BP (!) 174/82   Pulse (!) 131   Temp 98.2 F (36.8 C) (Oral)   Wt (!) 378 lb 8 oz (171.7 kg)   SpO2 95%   BMI 64.97 kg/m  BP Readings from Last 3 Encounters:  11/24/16 (!) 174/82  11/10/16 (!) 144/81  10/14/16 138/76     Physical Exam  Constitutional: She is oriented to person, place, and time. She appears well-developed.  Morbidly obese, chronically ill appearing on O2 per [place]  HENT:  Head: Normocephalic.  Eyes: Pupils are equal, round, and reactive to  light.  Pulmonary/Chest: She has wheezes. She has no rales. She exhibits no tenderness.  Musculoskeletal: Normal range of motion.  Neurological: She is alert and oriented to person, place, and time. No cranial nerve deficit.  Nursing note and vitals reviewed.         Assessment & Plan:   Chronic obstructive pulmonary disease with acute exacerbation ([place]) No Follow-up on file.

## 2016-11-25 ENCOUNTER — Other Ambulatory Visit: Payer: Self-pay | Admitting: *Deleted

## 2016-11-25 DIAGNOSIS — J449 Chronic obstructive pulmonary disease, unspecified: Secondary | ICD-10-CM

## 2016-11-29 ENCOUNTER — Ambulatory Visit (INDEPENDENT_AMBULATORY_CARE_PROVIDER_SITE_OTHER): Payer: Medicare Other | Admitting: Internal Medicine

## 2016-11-29 ENCOUNTER — Encounter: Payer: Self-pay | Admitting: Internal Medicine

## 2016-11-29 VITALS — BP 138/82 | HR 99 | Wt 360.0 lb

## 2016-11-29 DIAGNOSIS — IMO0002 Reserved for concepts with insufficient information to code with codable children: Secondary | ICD-10-CM

## 2016-11-29 DIAGNOSIS — E1165 Type 2 diabetes mellitus with hyperglycemia: Secondary | ICD-10-CM | POA: Diagnosis not present

## 2016-11-29 DIAGNOSIS — E1140 Type 2 diabetes mellitus with diabetic neuropathy, unspecified: Secondary | ICD-10-CM

## 2016-11-29 DIAGNOSIS — Z794 Long term (current) use of insulin: Secondary | ICD-10-CM | POA: Diagnosis not present

## 2016-11-29 DIAGNOSIS — E039 Hypothyroidism, unspecified: Secondary | ICD-10-CM

## 2016-11-29 LAB — POCT GLYCOSYLATED HEMOGLOBIN (HGB A1C): Hemoglobin A1C: 6

## 2016-11-29 MED ORDER — GLIPIZIDE 10 MG PO TABS: ORAL_TABLET | ORAL | 3 refills | Status: AC

## 2016-11-29 NOTE — Patient Instructions (Addendum)
Please continue: - Metformin 1000 mg 2x a day - NPH (Insulin N): - 25 units in am  - 20 units at bedtime  Please try to take 1/2 of the Glipizide dose before b'fast and dinner (5 mg).  Please return in 3 months with your sugar log.

## 2016-11-29 NOTE — Addendum Note (Signed)
Addended by: Caprice Beaver T on: 11/29/2016 02:48 PM   Modules accepted: Orders

## 2016-11-29 NOTE — Progress Notes (Signed)
Patient ID: Theresa Huffman, female   DOB: [birth year], 71 y.o.   MRN: [id number]  HPI: Theresa Huffman is a 71 y.o.-year-old female, returning for f/u for DM2, dx 2010, insulin-dependent, uncontrolled, with complications (Peripheral neuropathy, mild CKD) and hypothyroidism. Last visit 3 mo ago.   Sugars were slightly higher over the Holidays, now starting to improve.  DM2: Last hemoglobin A1c was: Lab Results  Component Value Date   HGBA1C 6.5 08/30/2016   HGBA1C 6.1 12/15/2015   HGBA1C 9.2 (H) 03/10/2015  Prev. HbA1C was 8.5% in 04/2011. She believes her HbA1c increased due to getting back to eating sweats.   Pt is on a regimen of: - Metformin 1000 bid - Glipizide 5 >> 10 mg bid - NPH insulin - 25 units in am  - 15-20 units at bedtime She was on Invokana 300 mg -  she had run out of it >> restarted >> developed a yeast inf >> stopped it 08/26/2014 We started Cycloset - 1.6 mg >> ran out of it and did not refill We had to stop Januvia 100 - expensive. She is in the doughnut hole by the end of the year.   Pt checks her sugars once a day and they are: - am:  176, 194-352 >> 87, 100--146 >> 79, 83, 94-145 >> 96, 94-143, 165, 227 >> 95-140 - before lunch: 208 >> 186 >> n/c - before dinner: 125-159 >> 114-163 >> n/c >> 97-150 >> 103-122 >> 104 >> 130 - 2h after dinner: 225 >> 139-189 >> n/c - bedtime: 142-187 >> n/c >> 93-182, 206 if eats out  >> 127-186 >> 85, 104-158 >> 74, 89-160, 202 - nighttime: 153 >> n/c No lows. Lowest sugar was 87 >> 79 >> 76 >> 74; she has hypoglycemia awareness at 80.  Highest sugar was low 200s >> 227 > 202.  - has mild CKD, last BUN/creatinine:  Lab Results  Component Value Date   BUN 15 05/27/2016   CREATININE 0.94 05/27/2016  Has h/o angioedema - ACEI stopped - last set of lipids: Lab Results  Component Value Date   CHOL 159 08/30/2016   HDL 61.50 08/30/2016   LDLCALC 69 08/30/2016   LDLDIRECT 92.5 08/25/2014   TRIG 143.0 08/30/2016    CHOLHDL 3 08/30/2016  She is on Zocor. She takes ASA 81. - last eye exam was in 04/20/2015. No DR.  - + numbness and tingling in her feet - improved   Pt also has a history of HTN, Hypothyroidism, vit D def., HL, morbid obesity, lymphedema, vv insuff., GERD, lumbago, chronic bilateral knee pain. No h/o UTIs. She was considering GBP, but is afraid of the sx.   Hypothyroidism: - dx 2001 - on Synthroid DAW 175 >> 200 mcg daily  - At 8:30 am - Eats 12 pm - With water - + calcium + D at night - no MVI, PPI  - reviewed TFTs: Lab Results  Component Value Date   TSH 2.32 04/13/2016   TSH 4.27 12/15/2015   TSH 3.57 04/23/2015   TSH 5.83 (H) 03/10/2015   TSH 2.08 04/18/2013   FREET4 1.20 12/15/2015   FREET4 1.19 04/23/2015   FREET4 1.09 03/10/2015   FREET4 1.37 02/13/2012   I reviewed pt's medications, allergies, PMH, social hx, family hx, and changes were documented in the history of present illness. Otherwise, unchanged from my initial visit note.  ROS: Constitutional: no weight gain, no fatigue, no subjective hyperthermia/hypothermia Eyes: no blurry vision, no xerophthalmia ENT:  no sore throat, no nodules palpated in throat, no dysphagia/odynophagia, no hoarseness Cardiovascular: no CP/+SOB - on O2/palpitations/+ leg swelling Respiratory: no cough/+SOB Gastrointestinal: no N/V/D/C, hair loss Musculoskeletal: no muscle/ joint aches Skin: no rashes  PE: BP 138/82 (BP Location: Left Arm, Patient Position: Sitting)   Pulse 99   Wt (!) 360 lb (163.3 kg)   SpO2 95% Comment: 3L  BMI 61.79 kg/m  Body mass index is 61.79 kg/m.  Wt Readings from Last 3 Encounters:  11/29/16 (!) 360 lb (163.3 kg)  11/24/16 (!) 378 lb 8 oz (171.7 kg)  10/14/16 (!) 376 lb (170.6 kg)   Constitutional: obesity grade 3, in NAD, + oxygen 2 LPM Eyes: PERRLA, EOMI, no exophthalmos ENT: moist mucous membranes, no thyromegaly, no cervical lymphadenopathy Cardiovascular: tachycardia, RR, No  MRG Respiratory: CTA B Gastrointestinal: abdomen soft, NT, ND, BS+ Musculoskeletal: strength intact in all 4 Skin: moist, warm, no rashes Severe lymphedema bilateral legs, but improved  ASSESSMENT: 1. DM2, insulin-dependent, uncontrolled, with complications - PN, on Neurontin  2. Hypothyroidism - On d.a.w. Synthroid    PLAN:  1. Patient with long-standing, uncontrolled diabetes, with better control after starting NPH insulin. Sugars are stable, but has occasional hyperglycemia after dietary indiscretions. She occasionally skips insulin if sugars are at goal. We discussed about decreasing glipizide from 10 to 5 mg before meals and I hope we can stop at next visit. - I advised her to:  Patient Instructions  Please continue: - Metformin 1000 mg 2x a day - NPH (Insulin N): - 25 units in am  - 20 units at bedtime  Please try to take 1/2 of the Glipizide dose before b'fast and dinner (5 mg).  Please return in 3 months with your sugar log.   - advised her to continue checking her sugars at different times of the day - check 2 times a day, rotating checkso - Needs a new eye exam >> again advised to schedule. - will check a new HbA1c today >> 6.0% (great!) - Return to clinic in 3 months with sugar log    2. Hypothyroidism - Reviewed the latest thyroid labs: normal 4 months ago - After we increased her levothyroxine dose to 200 at last visit, 8 months ago - discussed about taking the thyroid hormone every day, with water, >30 minutes before breakfast, separated by >4 hours from acid reflux medications, calcium, iron, multivitamins. She is taking it correctly - continue 200 mcg of Synthroid daily  3. Obesity grade 3 - ? If the 18 lbs weight loss in last 5 days is real... She thinks the scale is wrong.  Philemon Kingdom, MD PhD [facility]

## 2016-12-01 ENCOUNTER — Other Ambulatory Visit: Payer: Self-pay | Admitting: Physical Medicine & Rehabilitation

## 2016-12-02 ENCOUNTER — Encounter: Payer: Self-pay | Admitting: Internal Medicine

## 2016-12-08 ENCOUNTER — Encounter: Payer: Self-pay | Admitting: Internal Medicine

## 2016-12-08 DIAGNOSIS — J9610 Chronic respiratory failure, unspecified whether with hypoxia or hypercapnia: Secondary | ICD-10-CM | POA: Diagnosis not present

## 2016-12-08 DIAGNOSIS — J449 Chronic obstructive pulmonary disease, unspecified: Secondary | ICD-10-CM | POA: Diagnosis not present

## 2016-12-12 ENCOUNTER — Other Ambulatory Visit: Payer: Self-pay | Admitting: Family Medicine

## 2016-12-12 ENCOUNTER — Other Ambulatory Visit: Payer: Self-pay | Admitting: Internal Medicine

## 2016-12-12 ENCOUNTER — Telehealth: Payer: Self-pay | Admitting: *Deleted

## 2016-12-12 DIAGNOSIS — J449 Chronic obstructive pulmonary disease, unspecified: Secondary | ICD-10-CM

## 2016-12-12 NOTE — Telephone Encounter (Signed)
Pt is aware she needs 2L O2 with her CPAP. Order placed to inform [facility].

## 2016-12-13 ENCOUNTER — Encounter: Payer: Medicare Other | Attending: Physical Medicine & Rehabilitation | Admitting: Registered Nurse

## 2016-12-13 ENCOUNTER — Encounter: Payer: Self-pay | Admitting: Registered Nurse

## 2016-12-13 ENCOUNTER — Telehealth: Payer: Self-pay | Admitting: Registered Nurse

## 2016-12-13 VITALS — BP 146/81 | HR 72

## 2016-12-13 DIAGNOSIS — M25511 Pain in right shoulder: Secondary | ICD-10-CM | POA: Diagnosis not present

## 2016-12-13 DIAGNOSIS — Z79899 Other long term (current) drug therapy: Secondary | ICD-10-CM | POA: Diagnosis not present

## 2016-12-13 DIAGNOSIS — M25561 Pain in right knee: Secondary | ICD-10-CM

## 2016-12-13 DIAGNOSIS — Z5181 Encounter for therapeutic drug level monitoring: Secondary | ICD-10-CM | POA: Diagnosis not present

## 2016-12-13 DIAGNOSIS — G894 Chronic pain syndrome: Secondary | ICD-10-CM | POA: Diagnosis not present

## 2016-12-13 DIAGNOSIS — M75102 Unspecified rotator cuff tear or rupture of left shoulder, not specified as traumatic: Secondary | ICD-10-CM | POA: Diagnosis not present

## 2016-12-13 DIAGNOSIS — M47817 Spondylosis without myelopathy or radiculopathy, lumbosacral region: Secondary | ICD-10-CM | POA: Diagnosis not present

## 2016-12-13 DIAGNOSIS — G8929 Other chronic pain: Secondary | ICD-10-CM | POA: Diagnosis not present

## 2016-12-13 DIAGNOSIS — M7541 Impingement syndrome of right shoulder: Secondary | ICD-10-CM | POA: Diagnosis not present

## 2016-12-13 DIAGNOSIS — M25562 Pain in left knee: Secondary | ICD-10-CM | POA: Diagnosis not present

## 2016-12-13 MED ORDER — HYDROCODONE-ACETAMINOPHEN 10-325 MG PO TABS: 1.0000 | ORAL_TABLET | Freq: Three times a day (TID) | ORAL | 0 refills | Status: DC

## 2016-12-13 MED ORDER — MORPHINE SULFATE ER 30 MG PO TBCR: 30.0000 mg | EXTENDED_RELEASE_TABLET | Freq: Two times a day (BID) | ORAL | 0 refills | Status: DC

## 2016-12-13 NOTE — Telephone Encounter (Signed)
On 12/13/2016 the [place] was reviewed no conflict was seen on the [place] with multiple prescribers. Ms. Wempe has a signed narcotic contract with our office. If there were any discrepancies this would have been reported to her physician.

## 2016-12-13 NOTE — Progress Notes (Signed)
Subjective:    Patient ID: Theresa Huffman, female    DOB: 1946-09-18, 71 y.o.   MRN: [id number]  HPI: [name] is a 71year old female who returns for follow up appointmentfor chronic pain and medication refill. She states her pain is located in her right shoulder, lower back and rightknee. Also states she has generalized pain all over.She rates her pain 7. Her current exercise regime is walking short distances using loftstrand crutches in her home and performing chair exercises.  Husband in room.   Pain Inventory Average Pain 7 Pain Right Now 7 My pain is burning and aching  In the last 24 hours, has pain interfered with the following? General activity 8 Relation with others 6 Enjoyment of life 7 What TIME of day is your pain at its worst? morning, daytime  Sleep (in general) Fair  Pain is worse with: walking, bending, sitting and standing Pain improves with: rest and medication Relief from Meds: 5  Mobility walk with assistance ability to climb steps?  no do you drive?  yes use a wheelchair transfers alone  Function disabled: date disabled . I need assistance with the following:  meal prep, household duties and shopping  Neuro/Psych weakness numbness trouble walking depression  Prior Studies Any changes since last visit?  no  Physicians involved in your care Any changes since last visit?  no   Family History  Problem Relation Age of Onset  . Cancer Mother     lung and colon  . Stroke Sister    Social History   Social History  . Marital status: Married    Spouse name: N/A  . Number of children: 1  . Years of education: N/A   Occupational History  . Diability Unemployed   Social History Main Topics  . Smoking status: Never Smoker  . Smokeless tobacco: Never Used  . Alcohol use No  . Drug use: No  . Sexual activity: Not Asked   Other Topics Concern  . None   Social History Narrative   Lives in [place], one  adult child      Caffeine use: coffee daily in am   Past Surgical History:  Procedure Laterality Date  . CARDIAC CATHETERIZATION N/A 06/02/2016   Procedure: Left Heart Cath and Coronary Angiography;  Surgeon: [name], MD;  Location: [facility];  Service: Cardiovascular;  Laterality: N/A;  . CHOLECYSTECTOMY    . TUBAL LIGATION     Past Medical History:  Diagnosis Date  . CAD (coronary artery disease)    a. Lexiscan 05/12/16: smal defect of moderate severity in the mid anterior and apical location opartially reversible at rest. EF 42%. TID ratio uincreased at 1.23 possibly c/w balanced ischemia. Frequent PVCs  . Diabetes mellitus type 2 in obese ([place])   . Essential hypertension   . Facet syndrome, lumbar   . GERD (gastroesophageal reflux disease)   . Hyperlipidemia   . Hypothyroidism   . Low back pain   . Lumbago   . Lumbosacral spondylosis without myelopathy   . Lymphedema   . Lymphedema   . Morbid obesity ([place])   . Pain in limb   . Primary localized osteoarthrosis, lower leg    BP (!) 146/81   Pulse 72   SpO2 95%   Opioid Risk Score:   Fall Risk Score:  `1  Depression screen PHQ 2/9  Depression screen [facility] 2/9 09/26/2016 04/20/2016 01/26/2016 11/24/2015 07/16/2015  Decreased Interest 0 0 2  0 0  Down, Depressed, Hopeless 3 0 1 1 1   PHQ - 2 Score 3 0 3 1 1   Altered sleeping 0 - 3 - 1  Tired, decreased energy 3 - 3 - 1  Change in appetite 3 - 2 - 1  Feeling bad or failure about yourself  2 - 2 - 1  Trouble concentrating 0 - 0 - 0  Moving slowly or fidgety/restless 3 - 0 - 0  Suicidal thoughts 0 - 0 - 0  PHQ-9 Score 14 - 13 - 5  Difficult doing work/chores Not difficult at all - Somewhat difficult - Somewhat difficult  Some recent data might be hidden    Review of Systems  HENT: Negative.   Eyes: Negative.   Respiratory: Negative.   Cardiovascular: Negative.   Gastrointestinal: Negative.   Endocrine: Negative.   Genitourinary: Negative.     Musculoskeletal: Positive for arthralgias, back pain, gait problem, myalgias and neck pain.  Skin: Negative.   Allergic/Immunologic: Negative.   Neurological: Positive for weakness and numbness.  Psychiatric/Behavioral: Positive for dysphoric mood.  All other systems reviewed and are negative.      Objective:   Physical Exam  Constitutional: She is oriented to person, place, and time. She appears well-developed and well-nourished.  HENT:  Head: Normocephalic and atraumatic.  Neck: Normal range of motion. Neck supple.  Cardiovascular: Normal rate and regular rhythm.   Pulmonary/Chest: Effort normal and breath sounds normal.  Musculoskeletal:  Normal Muscle Bulk and Muscle Testing Reveals: Upper Extremities: Full ROM and Muscle Strength 5/5 Right AC Joint Tenderness Lumbar Paraspinal Tenderness: L-4-L-5 Lower Extremities: Full ROM and Muscle Strength 5/5 Arrived in Motorized Wheelchair  Neurological: She is alert and oriented to person, place, and time.  Skin: Skin is warm and dry.  Psychiatric: She has a normal mood and affect.  Nursing note and vitals reviewed.         Assessment & Plan:  1. Lumbar pain chronic, no radiculopathy: Lumbar degenerative disc L4-5 and L5-S1. 12/13/2016 Refilled: HYDRcodone 10/325mg  one tablet TID #90.  MS CONTIN 30 mg one every 12 hours #60.  We will continue the opioid monitoring program,this consists of regular clinic visits, examinations, urine drug screen, pill counts as well as use of [place] Controlled Substance reporting System. Continue Gabapentin. 2. Bilateral knee osteoarthritis: Continue with Exercise and Heat therapy. Continue current medication regime. 12/13/2016 3. Morbid Obesity: Continue with Diabetic Diet Regime and Exercise Regimen. 12/13/2016 4. Muscle Spasms: Continue Baclofen. 12/13/2016  20 minutes of face to face patient care time was spent during this visit. All questions were encouraged and  answered.  F/U in 1 month

## 2016-12-27 ENCOUNTER — Encounter: Payer: Self-pay | Admitting: Internal Medicine

## 2016-12-28 ENCOUNTER — Encounter: Payer: Self-pay | Admitting: *Deleted

## 2016-12-28 ENCOUNTER — Telehealth: Payer: Self-pay | Admitting: *Deleted

## 2016-12-28 NOTE — Telephone Encounter (Signed)
PT sent in the following message via MyChart. Please advise.   Appointment Request From: Theresa Huffman    With Provider: Arnette Norris, MD [facility]    Preferred Date Range: Any    Preferred Times: Any    Reason: To address the following health maintenance concerns.  Hepatitis C Screening  Tetanus/Tdap  Colonoscopy  Urine Microalbumin    Comments:

## 2016-12-28 NOTE — Telephone Encounter (Signed)
Please schedule an OV to discuss.

## 2017-01-01 ENCOUNTER — Encounter: Payer: Self-pay | Admitting: Internal Medicine

## 2017-01-02 ENCOUNTER — Ambulatory Visit: Payer: Medicare Other | Admitting: Internal Medicine

## 2017-01-03 ENCOUNTER — Ambulatory Visit: Payer: Medicare Other | Admitting: Family Medicine

## 2017-01-10 ENCOUNTER — Encounter: Payer: Self-pay | Admitting: Registered Nurse

## 2017-01-10 ENCOUNTER — Encounter: Payer: Medicare Other | Attending: Physical Medicine & Rehabilitation | Admitting: Registered Nurse

## 2017-01-10 VITALS — BP 146/74 | HR 91

## 2017-01-10 DIAGNOSIS — M47817 Spondylosis without myelopathy or radiculopathy, lumbosacral region: Secondary | ICD-10-CM | POA: Diagnosis not present

## 2017-01-10 DIAGNOSIS — Z79899 Other long term (current) drug therapy: Secondary | ICD-10-CM | POA: Diagnosis not present

## 2017-01-10 DIAGNOSIS — G894 Chronic pain syndrome: Secondary | ICD-10-CM | POA: Diagnosis not present

## 2017-01-10 DIAGNOSIS — Z5181 Encounter for therapeutic drug level monitoring: Secondary | ICD-10-CM | POA: Diagnosis not present

## 2017-01-10 DIAGNOSIS — G8929 Other chronic pain: Secondary | ICD-10-CM

## 2017-01-10 DIAGNOSIS — M7541 Impingement syndrome of right shoulder: Secondary | ICD-10-CM | POA: Diagnosis not present

## 2017-01-10 DIAGNOSIS — M25561 Pain in right knee: Secondary | ICD-10-CM | POA: Diagnosis not present

## 2017-01-10 DIAGNOSIS — M25562 Pain in left knee: Secondary | ICD-10-CM | POA: Insufficient documentation

## 2017-01-10 DIAGNOSIS — M75102 Unspecified rotator cuff tear or rupture of left shoulder, not specified as traumatic: Secondary | ICD-10-CM | POA: Insufficient documentation

## 2017-01-10 MED ORDER — HYDROCODONE-ACETAMINOPHEN 10-325 MG PO TABS: 1.0000 | ORAL_TABLET | Freq: Three times a day (TID) | ORAL | 0 refills | Status: DC

## 2017-01-10 MED ORDER — MORPHINE SULFATE ER 30 MG PO TBCR: 30.0000 mg | EXTENDED_RELEASE_TABLET | Freq: Two times a day (BID) | ORAL | 0 refills | Status: DC

## 2017-01-10 NOTE — Progress Notes (Signed)
Subjective:    Patient ID: Theresa Huffman, female    DOB: [birth year], 71 y.o.   MRN: [id number]  HPI:  Theresa Huffman is a 71year old female who returns for follow up appointmentfor chronic pain and medication refill. She states her pain is located in her  lower back and rightknee. She rates her pain 8. Her current exercise regime is walking short distances using loftstrand crutches in her home and performing chair exercises.  Husband in room.   Pain Inventory Average Pain 8 Pain Right Now 8 My pain is burning and aching  In the last 24 hours, has pain interfered with the following? General activity 9 Relation with others 6 Enjoyment of life 8 What TIME of day is your pain at its worst? morning Sleep (in general) Poor  Pain is worse with: walking, bending, standing and some activites Pain improves with: rest Relief from Meds: .  Mobility walk with assistance ability to climb steps?  no do you drive?  yes  Function disabled: date disabled . I need assistance with the following:  meal prep, household duties and shopping  Neuro/Psych weakness numbness depression  Prior Studies Any changes since last visit?  no  Physicians involved in your care Any changes since last visit?  no   Family History  Problem Relation Age of Onset  . Cancer Mother     lung and colon  . Stroke Sister    Social History   Social History  . Marital status: Married    Spouse name: N/A  . Number of children: 1  . Years of education: N/A   Occupational History  . Diability Unemployed   Social History Main Topics  . Smoking status: Never Smoker  . Smokeless tobacco: Never Used  . Alcohol use No  . Drug use: No  . Sexual activity: Not on file   Other Topics Concern  . Not on file   Social History Narrative   Lives in [place], one adult child      Caffeine use: coffee daily in am   Past Surgical History:  Procedure Laterality Date  . CARDIAC  CATHETERIZATION N/A 06/02/2016   Procedure: Left Heart Cath and Coronary Angiography;  Surgeon: [name], MD;  Location: [facility];  Service: Cardiovascular;  Laterality: N/A;  . CHOLECYSTECTOMY    . TUBAL LIGATION     Past Medical History:  Diagnosis Date  . CAD (coronary artery disease)    a. Lexiscan 05/12/16: smal defect of moderate severity in the mid anterior and apical location opartially reversible at rest. EF 42%. TID ratio uincreased at 1.23 possibly c/w balanced ischemia. Frequent PVCs  . Diabetes mellitus type 2 in obese ([place])   . Essential hypertension   . Facet syndrome, lumbar   . GERD (gastroesophageal reflux disease)   . Hyperlipidemia   . Hypothyroidism   . Low back pain   . Lumbago   . Lumbosacral spondylosis without myelopathy   . Lymphedema   . Lymphedema   . Morbid obesity ([place])   . Pain in limb   . Primary localized osteoarthrosis, lower leg    There were no vitals taken for this visit.  Opioid Risk Score:   Fall Risk Score:  `1  Depression screen PHQ 2/9  Depression screen [facility] 2/9 09/26/2016 04/20/2016 01/26/2016 11/24/2015 07/16/2015  Decreased Interest 0 0 2 0 0  Down, Depressed, Hopeless 3 0 1 1 1   PHQ - 2 Score 3 0  3 1 1   Altered sleeping 0 - 3 - 1  Tired, decreased energy 3 - 3 - 1  Change in appetite 3 - 2 - 1  Feeling bad or failure about yourself  2 - 2 - 1  Trouble concentrating 0 - 0 - 0  Moving slowly or fidgety/restless 3 - 0 - 0  Suicidal thoughts 0 - 0 - 0  PHQ-9 Score 14 - 13 - 5  Difficult doing work/chores Not difficult at all - Somewhat difficult - Somewhat difficult  Some recent data might be hidden     Review of Systems  Constitutional: Negative.   HENT: Negative.   Eyes: Negative.   Respiratory: Negative.   Cardiovascular: Negative.   Gastrointestinal: Negative.   Endocrine: Negative.   Genitourinary: Negative.   Musculoskeletal: Negative.   Skin: Negative.   Allergic/Immunologic: Negative.     Neurological: Negative.   Hematological: Negative.   Psychiatric/Behavioral: Negative.        Objective:   Physical Exam  Constitutional: She is oriented to person, place, and time. She appears well-developed and well-nourished.  HENT:  Head: Normocephalic and atraumatic.  Neck: Normal range of motion. Neck supple.  Cardiovascular: Normal rate and regular rhythm.   Pulmonary/Chest: Effort normal and breath sounds normal.  Musculoskeletal:  Normal Muscle Bulk and Muscle Testing Reveals: Upper Extremities: Full ROM and Muscle Strength 5/5 Lumbar Paraspinal Tenderness: L-3-L-5 Lower Extremities: Full ROM and Muscle Strength 5/5 Right Lower extremity Flexion Produces Pain into Patella Arriives in her motorized wheelchair   Neurological: She is alert and oriented to person, place, and time.  Skin: Skin is warm and dry.  Psychiatric: She has a normal mood and affect.  Nursing note and vitals reviewed.         Assessment & Plan:  1. Lumbar pain chronic, no radiculopathy: Lumbar degenerative disc L4-5 and L5-S1. 01/10/2017 Refilled: HYDRcodone 10/325mg  one tablet TID #90.  MS CONTIN 30 mg one every 12 hours #60.  We will continue the opioid monitoring program,this consists of regular clinic visits, examinations, urine drug screen, pill counts as well as use of [place] Controlled Substance reporting System. Continue Gabapentin. 2. Bilateral knee osteoarthritis: Continue with Exercise and Heat therapy. Continue current medication regime. 01/10/2017 3. Morbid Obesity: Continue with Diabetic Diet Regime and Exercise Regimen. 01/10/2017 4. Muscle Spasms: Continue Baclofen. 01/10/2017  20 minutes of face to face patient care time was spent during this visit. All questions were encouraged and answered.   F/U in 1 month

## 2017-01-11 ENCOUNTER — Ambulatory Visit: Payer: Medicare Other | Admitting: Registered Nurse

## 2017-01-11 DIAGNOSIS — E119 Type 2 diabetes mellitus without complications: Secondary | ICD-10-CM | POA: Diagnosis not present

## 2017-01-23 ENCOUNTER — Other Ambulatory Visit: Payer: Self-pay | Admitting: Family Medicine

## 2017-01-23 DIAGNOSIS — E1165 Type 2 diabetes mellitus with hyperglycemia: Secondary | ICD-10-CM

## 2017-01-23 DIAGNOSIS — E039 Hypothyroidism, unspecified: Secondary | ICD-10-CM

## 2017-01-23 DIAGNOSIS — Z1159 Encounter for screening for other viral diseases: Secondary | ICD-10-CM

## 2017-01-23 DIAGNOSIS — E785 Hyperlipidemia, unspecified: Secondary | ICD-10-CM

## 2017-01-23 DIAGNOSIS — IMO0002 Reserved for concepts with insufficient information to code with codable children: Secondary | ICD-10-CM

## 2017-01-23 DIAGNOSIS — Z794 Long term (current) use of insulin: Secondary | ICD-10-CM

## 2017-01-23 DIAGNOSIS — E1140 Type 2 diabetes mellitus with diabetic neuropathy, unspecified: Secondary | ICD-10-CM

## 2017-01-24 ENCOUNTER — Other Ambulatory Visit: Payer: Self-pay | Admitting: Physical Medicine & Rehabilitation

## 2017-01-24 ENCOUNTER — Other Ambulatory Visit: Payer: Self-pay | Admitting: Family Medicine

## 2017-01-24 ENCOUNTER — Ambulatory Visit (INDEPENDENT_AMBULATORY_CARE_PROVIDER_SITE_OTHER): Payer: Medicare Other

## 2017-01-24 VITALS — BP 130/80 | HR 95 | Temp 97.9°F | Ht 64.0 in | Wt 394.5 lb

## 2017-01-24 DIAGNOSIS — E039 Hypothyroidism, unspecified: Secondary | ICD-10-CM | POA: Diagnosis not present

## 2017-01-24 DIAGNOSIS — Z0000 Encounter for general adult medical examination without abnormal findings: Secondary | ICD-10-CM | POA: Diagnosis not present

## 2017-01-24 DIAGNOSIS — Z794 Long term (current) use of insulin: Secondary | ICD-10-CM

## 2017-01-24 DIAGNOSIS — E785 Hyperlipidemia, unspecified: Secondary | ICD-10-CM | POA: Diagnosis not present

## 2017-01-24 DIAGNOSIS — Z1159 Encounter for screening for other viral diseases: Secondary | ICD-10-CM

## 2017-01-24 DIAGNOSIS — E1140 Type 2 diabetes mellitus with diabetic neuropathy, unspecified: Secondary | ICD-10-CM

## 2017-01-24 DIAGNOSIS — E1165 Type 2 diabetes mellitus with hyperglycemia: Secondary | ICD-10-CM

## 2017-01-24 DIAGNOSIS — IMO0002 Reserved for concepts with insufficient information to code with codable children: Secondary | ICD-10-CM

## 2017-01-24 LAB — CBC WITH DIFFERENTIAL/PLATELET
BASOS PCT: 0.5 % (ref 0.0–3.0)
Basophils Absolute: 0.1 10*3/uL (ref 0.0–0.1)
EOS ABS: 0.2 10*3/uL (ref 0.0–0.7)
Eosinophils Relative: 2.1 % (ref 0.0–5.0)
HEMATOCRIT: 37.7 % (ref 36.0–46.0)
HEMOGLOBIN: 12 g/dL (ref 12.0–15.0)
LYMPHS PCT: 24.8 % (ref 12.0–46.0)
Lymphs Abs: 2.8 10*3/uL (ref 0.7–4.0)
MCHC: 32 g/dL (ref 30.0–36.0)
MCV: 87.2 fl (ref 78.0–100.0)
Monocytes Absolute: 0.5 10*3/uL (ref 0.1–1.0)
Monocytes Relative: 4.6 % (ref 3.0–12.0)
Neutro Abs: 7.6 10*3/uL (ref 1.4–7.7)
Neutrophils Relative %: 68 % (ref 43.0–77.0)
Platelets: 324 10*3/uL (ref 150.0–400.0)
RBC: 4.32 Mil/uL (ref 3.87–5.11)
RDW: 14.4 % (ref 11.5–15.5)
WBC: 11.3 10*3/uL — AB (ref 4.0–10.5)

## 2017-01-24 LAB — MICROALBUMIN / CREATININE URINE RATIO
CREATININE, U: 42.3 mg/dL
MICROALB/CREAT RATIO: 1.7 mg/g (ref 0.0–30.0)

## 2017-01-24 LAB — COMPREHENSIVE METABOLIC PANEL
ALBUMIN: 4.2 g/dL (ref 3.5–5.2)
ALK PHOS: 104 U/L (ref 39–117)
ALT: 24 U/L (ref 0–35)
AST: 27 U/L (ref 0–37)
BILIRUBIN TOTAL: 0.3 mg/dL (ref 0.2–1.2)
BUN: 17 mg/dL (ref 6–23)
CHLORIDE: 99 meq/L (ref 96–112)
CO2: 28 mEq/L (ref 19–32)
CREATININE: 0.81 mg/dL (ref 0.40–1.20)
Calcium: 9.8 mg/dL (ref 8.4–10.5)
GFR: 74.66 mL/min (ref >60.00)
Glucose, Bld: 138 mg/dL — ABNORMAL HIGH (ref 70–99)
Potassium: 4.2 mEq/L (ref 3.5–5.1)
Sodium: 140 mEq/L (ref 135–145)
TOTAL PROTEIN: 8.2 g/dL (ref 6.0–8.3)

## 2017-01-24 LAB — LIPID PANEL
CHOLESTEROL: 175 mg/dL (ref 0–200)
HDL: 69.4 mg/dL (ref >39.00)
LDL Cholesterol: 81 mg/dL (ref 0–99)
NonHDL: 105.57
TRIGLYCERIDES: 122 mg/dL (ref 0.0–149.0)
Total CHOL/HDL Ratio: 3
VLDL: 24.4 mg/dL (ref 0.0–40.0)

## 2017-01-24 LAB — HEMOGLOBIN A1C: HEMOGLOBIN A1C: 6.9 % — AB (ref 4.6–6.5)

## 2017-01-24 LAB — TSH: TSH: 0.75 u[IU]/mL (ref 0.35–4.50)

## 2017-01-24 NOTE — Progress Notes (Signed)
Pre visit review using our clinic review tool, if applicable. No additional management support is needed unless otherwise documented below in the visit note. 

## 2017-01-24 NOTE — Progress Notes (Signed)
PCP notes:   Health maintenance:  Foot exam - pt will complete at CPE; per pt, endocrinologist does not do foot exams  Bone density - pt is unable to complete screening  Mammogram - pt needs assessment by PCP prior to ordering this screening; discussed sitting mammogram with pt and she is agreeable; [facility] can do sitting mammogram per Mariam  Colon cancer screening - pt will do Cologuard  Tetanus - postponed/insurance  Urine microalbumin - completed  Hep C screening - completed  Eye exam - per pt, completed in March 2018  Abnormal screenings:   Hearing - failed  Patient concerns:   None  Nurse concerns:  None  Next PCP appt:   01/30/17 @ 1515

## 2017-01-24 NOTE — Patient Instructions (Signed)
Theresa Huffman , Thank you for taking time to come for your Medicare Wellness Visit. I appreciate your ongoing commitment to your health goals. Please review the following plan we discussed and let me know if I can assist you in the future.   These are the goals we discussed: Goals    . Eat more fruits and vegetables          Starting 01/30/2017, I will attempt to eat at least 4-5 servings of fresh fruits and vegetables.       This is a list of the screening recommended for you and due dates:  Health Maintenance  Topic Date Due  . Complete foot exam   01/30/2017*  . DEXA scan (bone density measurement)  01/24/2018*  . Mammogram  01/25/2019*  . Cologuard (Stool DNA test)  01/25/2020*  . Tetanus Vaccine  01/25/2027*  . Hemoglobin A1C  05/29/2017  . Pneumonia vaccines (2 of 2 - PPSV23) 07/25/2017  . Eye exam for diabetics  01/09/2018  . Urine Protein Check  01/24/2018  . Flu Shot  Completed  .  Hepatitis C: One time screening is recommended by Center for Disease Control  (CDC) for  adults born from [birth year] through 1963.   Completed  *Topic was postponed. The date shown is not the original due date.   Preventive Care for Adults  A healthy lifestyle and preventive care can promote health and wellness. Preventive health guidelines for adults include the following key practices.  . A routine yearly physical is a good way to check with your health care provider about your health and preventive screening. It is a chance to share any concerns and updates on your health and to receive a thorough exam.  . Visit your dentist for a routine exam and preventive care every 6 months. Brush your teeth twice a day and floss once a day. Good oral hygiene prevents tooth decay and gum disease.  . The frequency of eye exams is based on your age, health, family medical history, use  of contact lenses, and other factors. Follow your health care provider's ecommendations for frequency of eye exams.  . Eat a  healthy diet. Foods like vegetables, fruits, whole grains, low-fat dairy products, and lean protein foods contain the nutrients you need without too many calories. Decrease your intake of foods high in solid fats, added sugars, and salt. Eat the right amount of calories for you. Get information about a proper diet from your health care provider, if necessary.  . Regular physical exercise is one of the most important things you can do for your health. Most adults should get at least 150 minutes of moderate-intensity exercise (any activity that increases your heart rate and causes you to sweat) each week. In addition, most adults need muscle-strengthening exercises on 2 or more days a week.  Silver Sneakers may be a benefit available to you. To determine eligibility, you may visit the website: www.silversneakers.com or contact program at [phone] Mon-Fri between 8AM-8PM.   . Maintain a healthy weight. The body mass index (BMI) is a screening tool to identify possible weight problems. It provides an estimate of body fat based on height and weight. Your health care provider can find your BMI and can help you achieve or maintain a healthy weight.   For adults 20 years and older: ? A BMI below 18.5 is considered underweight. ? A BMI of 18.5 to 24.9 is normal. ? A BMI of 25 to 29.9 is considered overweight. ?  A BMI of 30 and above is considered obese.   . Maintain normal blood lipids and cholesterol levels by exercising and minimizing your intake of saturated fat. Eat a balanced diet with plenty of fruit and vegetables. Blood tests for lipids and cholesterol should begin at age 2 and be repeated every 5 years. If your lipid or cholesterol levels are high, you are over 50, or you are at high risk for heart disease, you may need your cholesterol levels checked more frequently. Ongoing high lipid and cholesterol levels should be treated with medicines if diet and exercise are not working.  . If you  smoke, find out from your health care provider how to quit. If you do not use tobacco, please do not start.  . If you choose to drink alcohol, please do not consume more than 2 drinks per day. One drink is considered to be 12 ounces (355 mL) of beer, 5 ounces (148 mL) of wine, or 1.5 ounces (44 mL) of liquor.  . If you are 10-41 years old, ask your health care provider if you should take aspirin to prevent strokes.  . Use sunscreen. Apply sunscreen liberally and repeatedly throughout the day. You should seek shade when your shadow is shorter than you. Protect yourself by wearing long sleeves, pants, a wide-brimmed hat, and sunglasses year round, whenever you are outdoors.  . Once a month, do a whole body skin exam, using a mirror to look at the skin on your back. Tell your health care provider of new moles, moles that have irregular borders, moles that are larger than a pencil eraser, or moles that have changed in shape or color.

## 2017-01-24 NOTE — Progress Notes (Signed)
Subjective:   Theresa Huffman is a 71 y.o. female who presents for Medicare Annual (Initial) preventive examination.  Review of Systems:  N/A Cardiac Risk Factors include: advanced age (>56men, >1 women);obesity (BMI >30kg/m2);diabetes mellitus;dyslipidemia;hypertension     Objective:     Vitals: BP 130/80 (BP Location: Right Wrist, Patient Position: Sitting, Cuff Size: Normal)   Pulse 95   Temp 97.9 F (36.6 C) (Oral)   Ht 5\' 4"  (1.626 m)   Wt (!) 394 lb 8 oz (178.9 kg)   SpO2 97%   BMI 67.72 kg/m   Body mass index is 67.72 kg/m.   Tobacco History  Smoking Status  . Never Smoker  Smokeless Tobacco  . Never Used     Counseling given: No   Past Medical History:  Diagnosis Date  . CAD (coronary artery disease)    a. Lexiscan 05/12/16: smal defect of moderate severity in the mid anterior and apical location opartially reversible at rest. EF 42%. TID ratio uincreased at 1.23 possibly c/w balanced ischemia. Frequent PVCs  . Diabetes mellitus type 2 in obese ([place])   . Essential hypertension   . Facet syndrome, lumbar   . GERD (gastroesophageal reflux disease)   . Hyperlipidemia   . Hypothyroidism   . Low back pain   . Lumbago   . Lumbosacral spondylosis without myelopathy   . Lymphedema   . Lymphedema   . Morbid obesity ([place])   . Pain in limb   . Primary localized osteoarthrosis, lower leg    Past Surgical History:  Procedure Laterality Date  . CARDIAC CATHETERIZATION N/A 06/02/2016   Procedure: Left Heart Cath and Coronary Angiography;  Surgeon: [name], MD;  Location: [facility];  Service: Cardiovascular;  Laterality: N/A;  . CHOLECYSTECTOMY    . TUBAL LIGATION     Family History  Problem Relation Age of Onset  . Cancer Mother     lung and colon  . Stroke Sister    History  Sexual Activity  . Sexual activity: Not on file    Outpatient Encounter Prescriptions as of 01/24/2017  Medication Sig  . amLODipine (NORVASC) 5 MG tablet  TAKE 1 TABLET BY MOUTH DAILY  . aspirin EC 81 MG tablet Take 81 mg by mouth daily.   . baclofen (LIORESAL) 10 MG tablet TAKE ONE (1) TABLET BY MOUTH THREE (3) TIMES EACH DAY  . budesonide (PULMICORT) 0.5 MG/2ML nebulizer solution Take 2 mLs (0.5 mg total) by nebulization 2 (two) times daily.  . Calcium Carbonate-Vitamin D 600-400 MG-UNIT per tablet Take 1 tablet by mouth daily. 1200mg  once daily  . furosemide (LASIX) 40 MG tablet Take 1 tablet (40 mg total) by mouth 2 (two) times daily.  Marland Kitchen gabapentin (NEURONTIN) 300 MG capsule TAKE TWO CAPSULES BY MOUTH THREE TIMES DAILY  . glipiZIDE (GLUCOTROL) 10 MG tablet TAKE 1/2 TABLET BY MOUTH TWICE DAILY BEFORE A MEAL  . HYDROcodone-acetaminophen (NORCO) 10-325 MG tablet Take 1 tablet by mouth 3 (three) times daily. As needed for back or leg pain  . ibuprofen (ADVIL,MOTRIN) 800 MG tablet TAKE ONE (1) TABLET BY MOUTH THREE (3) TIMES EACH DAY  . Insulin NPH, Human,, Isophane, (HUMULIN N) 100 UNIT/ML Kiwkpen Inject units 20 units before breakfast and 15 units before bedtime (Patient taking differently: Inject units 25 units before breakfast and 20 units before bedtime)  . metFORMIN (GLUCOPHAGE) 1000 MG tablet Take 1 tablet (1,000 mg total) by mouth 2 (two) times daily with a meal.  .  morphine (MS CONTIN) 30 MG 12 hr tablet Take 1 tablet (30 mg total) by mouth every 12 (twelve) hours.  . potassium chloride SA (K-DUR,KLOR-CON) 20 MEQ tablet TAKE 1 TABLET BY MOUTH DAILY  . simvastatin (ZOCOR) 20 MG tablet Take 1 tablet (20 mg total) by mouth daily.  Marland Kitchen SYNTHROID 200 MCG tablet TAKE ONE TABLET BY MOUTH EVERY MORNING BEFORE BREAKFAST *KEEP/MAKE APPOINTMENT FOR FURTHER REFILLS*  . venlafaxine (EFFEXOR) 75 MG tablet TAKE THREE TABLETS BY MOUTH EVERY NIGHT AT BEDTIME  . [DISCONTINUED] BREO ELLIPTA 200-25 MCG/INH AEPB    No facility-administered encounter medications on file as of 01/24/2017.     Activities of Daily Living In your present state of health, do you have  any difficulty performing the following activities: 01/24/2017  Hearing? Y  Vision? N  Difficulty concentrating or making decisions? N  Walking or climbing stairs? Y  Dressing or bathing? N  Doing errands, shopping? N  Preparing Food and eating ? N  Using the Toilet? N  In the past six months, have you accidently leaked urine? N  Do you have problems with loss of bowel control? N  Managing your Medications? N  Managing your Finances? N  Housekeeping or managing your Housekeeping? Y  Some recent data might be hidden    Patient Care Team: Lucille Passy, MD as PCP - General Flora Lipps, MD as Consulting Physician (Pulmonary Disease) Philemon Kingdom, MD as Consulting Physician (Internal Medicine) Charlett Blake, MD as Consulting Physician (Physical Medicine and Rehabilitation)    Assessment:     Hearing Screening   125Hz  250Hz  500Hz  1000Hz  2000Hz  3000Hz  4000Hz  6000Hz  8000Hz   Right ear:   0 0 0  0    Left ear:   40 0 0  0    Vision Screening Comments: Last vision exam in March 2018   Exercise Activities and Dietary recommendations Current Exercise Habits: The patient does not participate in regular exercise at present, Exercise limited by: orthopedic condition(s)  Goals    . Eat more fruits and vegetables          Starting 01/30/2017, I will attempt to eat at least 4-5 servings of fresh fruits and vegetables.      Fall Risk Fall Risk  01/24/2017 12/13/2016 11/10/2016 10/13/2016 09/26/2016  Falls in the past year? No No No Exclusion - non ambulatory No  Risk for fall due to : - - - - Impaired mobility   Depression Screen PHQ 2/9 Scores 01/24/2017 09/26/2016 04/20/2016 01/26/2016  PHQ - 2 Score 0 3 0 3  PHQ- 9 Score - 14 - 13  Exception Documentation - - - -  Not completed - - - -     Cognitive Function MMSE - Mini Mental State Exam 01/24/2017  Orientation to time 5  Orientation to Place 5  Registration 3  Attention/ Calculation 0  Recall 3  Language- name 2 objects 0   Language- repeat 1  Language- follow 3 step command 3  Language- read & follow direction 0  Write a sentence 0  Copy design 0  Total score 20     PLEASE NOTE: A Mini-Cog screen was completed. Maximum score is 20. A value of 0 denotes this part of Folstein MMSE was not completed or the patient failed this part of the Mini-Cog screening.   Mini-Cog Screening Orientation to Time - Max 5 pts Orientation to Place - Max 5 pts Registration - Max 3 pts Recall - Max 3 pts Language Repeat -  Max 1 pts Language Follow 3 Step Command - Max 3 pts     Results for orders placed or performed in visit on 08/22/13  Stool culture     Status: None   Collection Time: 08/23/13  2:05 PM  Result Value Ref Range Status   Organism ID, Bacteria No Salmonella,Shigella,Campylobacter,Yersinia,or  Final   Organism ID, Bacteria No E.coli [id number]:[id number] isolated.  Final  Clostridium difficile EIA     Status: None   Collection Time: 08/23/13  2:05 PM  Result Value Ref Range Status   CDIFTX NEGATIVE  Final  Giardia/cryptosporidium (EIA)     Status: None   Collection Time: 08/23/13  2:05 PM  Result Value Ref Range Status   Giardia Screen (EIA) NEGATIVE  Final   Cryptosporidium Screen (EIA) NEGATIVE  Final    Immunization History  Administered Date(s) Administered  . Influenza Split 07/27/2011  . Influenza Whole 09/18/2009, 09/01/2010  . Influenza,inj,Quad PF,36+ Mos 07/10/2013, 07/31/2014, 10/16/2015, 07/25/2016  . Pneumococcal Conjugate-13 07/25/2016  . Pneumococcal Polysaccharide-23 01/25/2010   Screening Tests Health Maintenance  Topic Date Due  . FOOT EXAM  01/30/2017 (Originally 02/14/2015)  . DEXA SCAN  01/24/2018 (Originally 09/27/2013)  . MAMMOGRAM  01/25/2019 (Originally 05/12/2010)  . Fecal DNA (Cologuard)  01/25/2020 (Originally 110/17/201999)  . TETANUS/TDAP  01/25/2027 (Originally 110/17/201968)  . HEMOGLOBIN A1C  05/29/2017  . PNA vac Low Risk Adult (2 of 2 - PPSV23) 07/25/2017  . OPHTHALMOLOGY EXAM   01/09/2018  . URINE MICROALBUMIN  01/24/2018  . INFLUENZA VACCINE  Completed  . Hepatitis C Screening  Completed      Plan:    I have personally reviewed and addressed the Medicare Annual Wellness questionnaire and have noted the following in the patient's chart:  A. Medical and social history B. Use of alcohol, tobacco or illicit drugs  C. Current medications and supplements D. Functional ability and status E.  Nutritional status F.  Physical activity G. Advance directives H. List of other physicians I.  Hospitalizations, surgeries, and ER visits in previous 12 months J.  [place] to include hearing, vision, cognitive, depression L. Referrals and appointments - none  In addition, I have reviewed and discussed with patient certain preventive protocols, quality metrics, and best practice recommendations. A written personalized care plan for preventive services as well as general preventive health recommendations were provided to patient.  See attached scanned questionnaire for additional information.   Signed,   Lindell Noe, MHA, BS, LPN Health Coach

## 2017-01-25 LAB — HEPATITIS C ANTIBODY: HCV Ab: NEGATIVE

## 2017-01-25 NOTE — Progress Notes (Signed)
Medical screening examination/treatment/procedure(s) were performed by registered nurse and as supervising non-physician practitioner, I was immediately available for consultation/collaboration.   Tajana Crotteau, NP  

## 2017-01-30 ENCOUNTER — Other Ambulatory Visit: Payer: Self-pay | Admitting: Physical Medicine & Rehabilitation

## 2017-01-30 ENCOUNTER — Ambulatory Visit (INDEPENDENT_AMBULATORY_CARE_PROVIDER_SITE_OTHER): Payer: Medicare Other | Admitting: Family Medicine

## 2017-01-30 VITALS — BP 144/84 | HR 127 | Wt 394.0 lb

## 2017-01-30 DIAGNOSIS — E039 Hypothyroidism, unspecified: Secondary | ICD-10-CM | POA: Diagnosis not present

## 2017-01-30 DIAGNOSIS — I10 Essential (primary) hypertension: Secondary | ICD-10-CM

## 2017-01-30 DIAGNOSIS — M25561 Pain in right knee: Secondary | ICD-10-CM

## 2017-01-30 DIAGNOSIS — E1140 Type 2 diabetes mellitus with diabetic neuropathy, unspecified: Secondary | ICD-10-CM

## 2017-01-30 DIAGNOSIS — E1165 Type 2 diabetes mellitus with hyperglycemia: Secondary | ICD-10-CM | POA: Diagnosis not present

## 2017-01-30 DIAGNOSIS — E785 Hyperlipidemia, unspecified: Secondary | ICD-10-CM | POA: Diagnosis not present

## 2017-01-30 DIAGNOSIS — G8929 Other chronic pain: Secondary | ICD-10-CM

## 2017-01-30 DIAGNOSIS — M25562 Pain in left knee: Secondary | ICD-10-CM

## 2017-01-30 DIAGNOSIS — Z794 Long term (current) use of insulin: Secondary | ICD-10-CM

## 2017-01-30 DIAGNOSIS — IMO0002 Reserved for concepts with insufficient information to code with codable children: Secondary | ICD-10-CM

## 2017-01-30 NOTE — Assessment & Plan Note (Signed)
Reasonable control. No changes made. 

## 2017-01-30 NOTE — Progress Notes (Signed)
Subjective:   Patient ID: Theresa Huffman, female    DOB: 02-10-1946, 71 y.o.   MRN: [id number]  Theresa Huffman is a pleasant 71 y.o. year old morbidly obese female with a h/o morbid obesity, DM, hypothyroidism who presents to clinic today with Follow-up on 01/30/2017  HPI:  Medicare wellness visit with Candis Musa, RN on 01/24/17.  Note reviewed.  On narcotics through pain management.  Last seen on 01/10/17.  DM- improved control now that she is being compliant with endo recommendations ( seeing Dr. Cruzita Lederer).  Taking glucotrol 10 mg twice daily and Metformin  1000 mg twice daily and now on insulin as well.  Lab Results  Component Value Date   HGBA1C 6.9 (H) 01/24/2017   Hypothyroidism- takes synthroid 175 mcg daily.  Lab Results  Component Value Date   TSH 0.75 01/24/2017   Denies any other symptoms of hypo or hyperthyroidism.  HTN- currently taking Norvasc 5 mg daily. Blood pressure very elevated today. Rushed to get here and did not take her rxs.  No HA, blurred vision, CP or SOB. BP Readings from Last 3 Encounters:  01/30/17 (!) 144/84  01/24/17 130/80  01/10/17 (!) 146/74   HLD- due for labs.  Compliant with statin. Lab Results  Component Value Date   CHOL 175 01/24/2017   HDL 69.40 01/24/2017   LDLCALC 81 01/24/2017   LDLDIRECT 92.5 08/25/2014   TRIG 122.0 01/24/2017   CHOLHDL 3 01/24/2017     Lab Results  Component Value Date   CREATININE 0.81 01/24/2017    Current Outpatient Prescriptions on File Prior to Visit  Medication Sig Dispense Refill  . amLODipine (NORVASC) 5 MG tablet TAKE 1 TABLET BY MOUTH DAILY 90 tablet 0  . aspirin EC 81 MG tablet Take 81 mg by mouth daily.     . baclofen (LIORESAL) 10 MG tablet TAKE ONE (1) TABLET BY MOUTH THREE (3) TIMES EACH DAY 90 each 1  . budesonide (PULMICORT) 0.5 MG/2ML nebulizer solution Take 2 mLs (0.5 mg total) by nebulization 2 (two) times daily. 120 mL 12  . Calcium Carbonate-Vitamin D 600-400  MG-UNIT per tablet Take 1 tablet by mouth daily. 1200mg  once daily    . furosemide (LASIX) 40 MG tablet Take 1 tablet (40 mg total) by mouth 2 (two) times daily. 180 tablet 0  . gabapentin (NEURONTIN) 300 MG capsule TAKE TWO CAPSULES BY MOUTH THREE TIMES DAILY 180 capsule 1  . glipiZIDE (GLUCOTROL) 10 MG tablet TAKE 1/2 TABLET BY MOUTH TWICE DAILY BEFORE A MEAL 180 tablet 3  . HYDROcodone-acetaminophen (NORCO) 10-325 MG tablet Take 1 tablet by mouth 3 (three) times daily. As needed for back or leg pain 90 tablet 0  . ibuprofen (ADVIL,MOTRIN) 800 MG tablet TAKE ONE (1) TABLET BY MOUTH THREE (3) TIMES EACH DAY 90 tablet 6  . Insulin NPH, Human,, Isophane, (HUMULIN N) 100 UNIT/ML Kiwkpen Inject units 20 units before breakfast and 15 units before bedtime (Patient taking differently: Inject units 25 units before breakfast and 20 units before bedtime) 15 mL 2  . metFORMIN (GLUCOPHAGE) 1000 MG tablet Take 1 tablet (1,000 mg total) by mouth 2 (two) times daily with a meal. 60 tablet 11  . morphine (MS CONTIN) 30 MG 12 hr tablet Take 1 tablet (30 mg total) by mouth every 12 (twelve) hours. 60 tablet 0  . potassium chloride SA (K-DUR,KLOR-CON) 20 MEQ tablet TAKE 1 TABLET BY MOUTH DAILY 90 tablet 0  . simvastatin (ZOCOR) 20 MG tablet  Take 1 tablet (20 mg total) by mouth daily. 90 tablet 1  . SYNTHROID 200 MCG tablet TAKE ONE TABLET BY MOUTH EVERY MORNING BEFORE BREAKFAST *KEEP/MAKE APPOINTMENT FOR FURTHER REFILLS* 30 tablet 3  . venlafaxine (EFFEXOR) 75 MG tablet TAKE THREE TABLETS BY MOUTH EVERY NIGHT AT BEDTIME 90 tablet 1   No current facility-administered medications on file prior to visit.     Allergies  Allergen Reactions  . Ace Inhibitors Other (See Comments)    Angioedema   . Erythromycin Nausea And Vomiting  . Invokana [Canagliflozin] Other (See Comments)    Yeast infection  . Other Rash and Other (See Comments)    Chalky drink    Past Medical History:  Diagnosis Date  . CAD (coronary  artery disease)    a. Lexiscan 05/12/16: smal defect of moderate severity in the mid anterior and apical location opartially reversible at rest. EF 42%. TID ratio uincreased at 1.23 possibly c/w balanced ischemia. Frequent PVCs  . Diabetes mellitus type 2 in obese ([place])   . Essential hypertension   . Facet syndrome, lumbar   . GERD (gastroesophageal reflux disease)   . Hyperlipidemia   . Hypothyroidism   . Low back pain   . Lumbago   . Lumbosacral spondylosis without myelopathy   . Lymphedema   . Lymphedema   . Morbid obesity ([place])   . Pain in limb   . Primary localized osteoarthrosis, lower leg     Past Surgical History:  Procedure Laterality Date  . CARDIAC CATHETERIZATION N/A 06/02/2016   Procedure: Left Heart Cath and Coronary Angiography;  Surgeon: [name], MD;  Location: [facility];  Service: Cardiovascular;  Laterality: N/A;  . CHOLECYSTECTOMY    . TUBAL LIGATION      Family History  Problem Relation Age of Onset  . Cancer Mother     lung and colon  . Stroke Sister     Social History   Social History  . Marital status: Married    Spouse name: N/A  . Number of children: 1  . Years of education: N/A   Occupational History  . Diability Unemployed   Social History Main Topics  . Smoking status: Never Smoker  . Smokeless tobacco: Never Used  . Alcohol use No  . Drug use: No  . Sexual activity: Not on file   Other Topics Concern  . Not on file   Social History Narrative   Lives in [place], one adult child      Caffeine use: coffee daily in am   The PMH, PSH, Social History, Family History, Medications, and allergies have been reviewed in [facility], and have been updated if relevant.    Review of Systems  Constitutional: Negative for chills.  HENT: Negative for congestion, postnasal drip and rhinorrhea.   Respiratory: Positive for shortness of breath. Negative for wheezing.   Cardiovascular: Negative.   Gastrointestinal: Negative for  abdominal pain, anal bleeding, blood in stool, constipation, diarrhea, nausea, rectal pain and vomiting.  Endocrine: Negative.   Genitourinary: Negative.   Musculoskeletal: Negative.   Allergic/Immunologic: Negative.   Neurological: Negative.   Hematological: Negative.   All other systems reviewed and are negative.      Objective:    BP (!) 144/84   Pulse (!) 127 Comment: VERY EXERTED AMBULATING & ON O2  Wt (!) 394 lb (178.7 kg)   SpO2 96% Comment: ON 4L O2  BMI 67.63 kg/m   Wt Readings from Last 3 Encounters:  01/30/17 (!) 394 lb (178.7 kg)  01/24/17 (!) 394 lb 8 oz (178.9 kg)  11/29/16 (!) 360 lb (163.3 kg)     Physical Exam  Constitutional: She is oriented to person, place, and time. She appears well-developed and well-nourished. No distress.  Morbidly obese  HENT:  Head: Normocephalic and atraumatic.  Eyes: Conjunctivae are normal.  Neck: Normal range of motion.  Cardiovascular:  Tachycardia, resolved after she has been sitting in room for a few minutes  Pulmonary/Chest: Effort normal. No respiratory distress. She has no wheezes. She has no rales. She exhibits no tenderness.  Abdominal: She exhibits no distension.  Difficult to assess due to body habitus  Musculoskeletal: Normal range of motion.  Neurological: She is alert and oriented to person, place, and time. No cranial nerve deficit.  Skin: Skin is warm and dry.  Psychiatric: She has a normal mood and affect. Her behavior is normal. Judgment and thought content normal.  Nursing note and vitals reviewed.         Assessment & Plan:   Essential hypertension  Uncontrolled type 2 diabetes mellitus with diabetic neuropathy, with long-term current use of insulin (HCC)  Hypothyroidism, unspecified type  Hyperlipidemia, unspecified hyperlipidemia type  Bilateral chronic knee pain No Follow-up on file.

## 2017-01-30 NOTE — Assessment & Plan Note (Signed)
Deteriorated and she is aware that this is a major contributing factor to her health problems.  Has declined bariatric surgery.

## 2017-01-30 NOTE — Assessment & Plan Note (Signed)
Deteriorated some but still at goal much improved from where she was last year. Continue current rxs. No changes made.

## 2017-01-30 NOTE — Assessment & Plan Note (Signed)
Euthyroid. NO changes made to rxs.

## 2017-01-31 ENCOUNTER — Other Ambulatory Visit: Payer: Self-pay | Admitting: Family Medicine

## 2017-01-31 DIAGNOSIS — J209 Acute bronchitis, unspecified: Secondary | ICD-10-CM

## 2017-02-03 IMAGING — CR DG SHOULDER 1V*R*
1 series · 1 of 1 positions shown · non-contrast
Comparison: None.

CLINICAL DATA: Bilateral shoulder pain, no injury

EXAM:
RIGHT SHOULDER - 1 VIEW

[w shoulder ap external right]
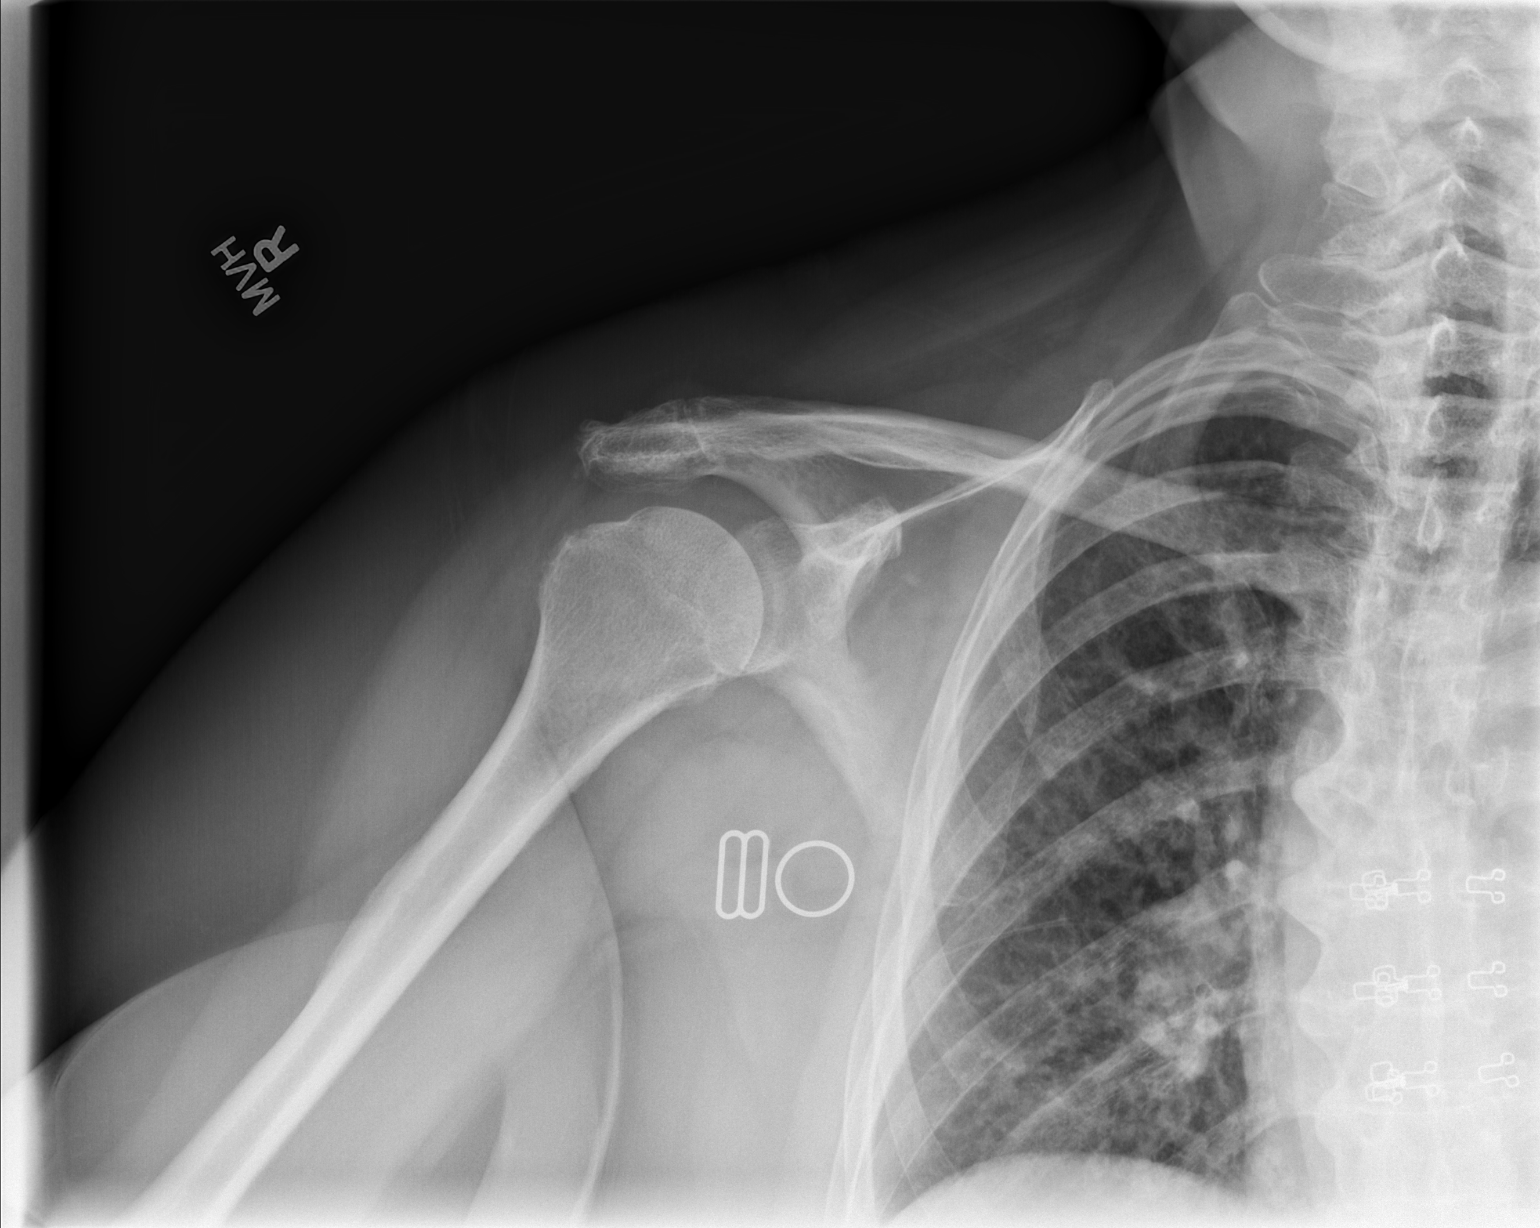

[1 of 1 positions shown; findings below may reference images not displayed]

FINDINGS: A single view of the right shoulder shows moderate degenerative
joint disease of the right shoulder with some loss of glenohumeral
joint space and spurring. Small acromial spurs are present which
could result in a degree of impingement. No acute abnormality is
seen.
IMPRESSION: Moderate degenerative joint disease of the right shoulder. No
fracture.

## 2017-02-09 ENCOUNTER — Encounter: Payer: Medicare Other | Attending: Physical Medicine & Rehabilitation

## 2017-02-09 ENCOUNTER — Encounter: Payer: Self-pay | Admitting: Physical Medicine & Rehabilitation

## 2017-02-09 ENCOUNTER — Ambulatory Visit (HOSPITAL_BASED_OUTPATIENT_CLINIC_OR_DEPARTMENT_OTHER): Payer: Medicare Other | Admitting: Physical Medicine & Rehabilitation

## 2017-02-09 VITALS — BP 154/74 | HR 82

## 2017-02-09 DIAGNOSIS — G8929 Other chronic pain: Secondary | ICD-10-CM | POA: Insufficient documentation

## 2017-02-09 DIAGNOSIS — M7541 Impingement syndrome of right shoulder: Secondary | ICD-10-CM | POA: Insufficient documentation

## 2017-02-09 DIAGNOSIS — M25562 Pain in left knee: Secondary | ICD-10-CM | POA: Diagnosis not present

## 2017-02-09 DIAGNOSIS — M75102 Unspecified rotator cuff tear or rupture of left shoulder, not specified as traumatic: Secondary | ICD-10-CM | POA: Insufficient documentation

## 2017-02-09 DIAGNOSIS — M25561 Pain in right knee: Secondary | ICD-10-CM

## 2017-02-09 DIAGNOSIS — Z79899 Other long term (current) drug therapy: Secondary | ICD-10-CM

## 2017-02-09 DIAGNOSIS — G894 Chronic pain syndrome: Secondary | ICD-10-CM

## 2017-02-09 DIAGNOSIS — Z5181 Encounter for therapeutic drug level monitoring: Secondary | ICD-10-CM | POA: Insufficient documentation

## 2017-02-09 MED ORDER — HYDROCODONE-ACETAMINOPHEN 10-325 MG PO TABS: 1.0000 | ORAL_TABLET | Freq: Three times a day (TID) | ORAL | 0 refills | Status: DC

## 2017-02-09 MED ORDER — MORPHINE SULFATE ER 30 MG PO TBCR: 30.0000 mg | EXTENDED_RELEASE_TABLET | Freq: Two times a day (BID) | ORAL | 0 refills | Status: DC

## 2017-02-09 NOTE — Patient Instructions (Signed)
We discussed that as you age , metabolism slows down so doses of opioids can be reduced.  I would recommend reducing hydrocodone to 7.5mg 

## 2017-02-09 NOTE — Progress Notes (Signed)
Subjective:    Patient ID: Theresa Huffman, female    DOB: Aug 28, 1946, 71 y.o.   MRN: [id number]  HPI Uses [place] crutches at home Uses CPAP at night No constipation problems, no laxatives Uses crutches to transfer to wean wheelchair and other surfaces, does not use the wheelchair at home Pain Inventory Average Pain 7 Pain Right Now 7 My pain is burning and aching  In the last 24 hours, has pain interfered with the following? General activity 8 Relation with others 5 Enjoyment of life 7 What TIME of day is your pain at its worst? monring and night Sleep (in general) Fair  Pain is worse with: walking, bending and standing Pain improves with: rest and medication Relief from Meds: 5  Mobility walk with assistance ability to climb steps?  no do you drive?  yes use a wheelchair transfers alone  Function disabled: date disabled . I need assistance with the following:  meal prep, household duties and shopping  Neuro/Psych weakness trouble walking depression  Prior Studies Any changes since last visit?  no  Physicians involved in your care Any changes since last visit?  no   Family History  Problem Relation Age of Onset  . Cancer Mother     lung and colon  . Stroke Sister    Social History   Social History  . Marital status: Married    Spouse name: N/A  . Number of children: 1  . Years of education: N/A   Occupational History  . Diability Unemployed   Social History Main Topics  . Smoking status: Never Smoker  . Smokeless tobacco: Never Used  . Alcohol use No  . Drug use: No  . Sexual activity: Not on file   Other Topics Concern  . Not on file   Social History Narrative   Lives in [place], one adult child      Caffeine use: coffee daily in am   Past Surgical History:  Procedure Laterality Date  . CARDIAC CATHETERIZATION N/A 06/02/2016   Procedure: Left Heart Cath and Coronary Angiography;  Surgeon: [name], MD;   Location: [facility];  Service: Cardiovascular;  Laterality: N/A;  . CHOLECYSTECTOMY    . TUBAL LIGATION     Past Medical History:  Diagnosis Date  . CAD (coronary artery disease)    a. Lexiscan 05/12/16: smal defect of moderate severity in the mid anterior and apical location opartially reversible at rest. EF 42%. TID ratio uincreased at 1.23 possibly c/w balanced ischemia. Frequent PVCs  . Diabetes mellitus type 2 in obese ([place])   . Essential hypertension   . Facet syndrome, lumbar   . GERD (gastroesophageal reflux disease)   . Hyperlipidemia   . Hypothyroidism   . Low back pain   . Lumbago   . Lumbosacral spondylosis without myelopathy   . Lymphedema   . Lymphedema   . Morbid obesity ([place])   . Pain in limb   . Primary localized osteoarthrosis, lower leg    There were no vitals taken for this visit.  Opioid Risk Score:   Fall Risk Score:  `1  Depression screen PHQ 2/9  Depression screen [facility] 2/9 01/24/2017 09/26/2016 04/20/2016 01/26/2016 11/24/2015 07/16/2015  Decreased Interest 0 0 0 2 0 0  Down, Depressed, Hopeless 0 3 0 1 1 1   PHQ - 2 Score 0 3 0 3 1 1   Altered sleeping - 0 - 3 - 1  Tired, decreased energy - 3 - 3 -  1  Change in appetite - 3 - 2 - 1  Feeling bad or failure about yourself  - 2 - 2 - 1  Trouble concentrating - 0 - 0 - 0  Moving slowly or fidgety/restless - 3 - 0 - 0  Suicidal thoughts - 0 - 0 - 0  PHQ-9 Score - 14 - 13 - 5  Difficult doing work/chores - Not difficult at all - Somewhat difficult - Somewhat difficult  Some recent data might be hidden    Review of Systems  Constitutional: Negative.   HENT: Negative.   Eyes: Negative.   Respiratory: Negative.   Cardiovascular: Negative.   Gastrointestinal: Negative.   Endocrine: Negative.   Genitourinary: Negative.   Musculoskeletal: Negative.   Skin: Negative.   Allergic/Immunologic: Negative.   Neurological: Negative.   Hematological: Negative.   Psychiatric/Behavioral: Negative.     All other systems reviewed and are negative.      Objective:   Physical Exam  Constitutional: She is oriented to person, place, and time. She appears well-developed and well-nourished.  HENT:  Head: Normocephalic and atraumatic.  Eyes: Conjunctivae and EOM are normal. Pupils are equal, round, and reactive to light.  Neck: Normal range of motion.  Cardiovascular: Normal rate, regular rhythm and normal heart sounds.   Pulmonary/Chest: Effort normal and breath sounds normal. No respiratory distress. She has no wheezes.  Abdominal: Soft. Bowel sounds are normal. She exhibits no distension.  Musculoskeletal:       Right shoulder: She exhibits normal range of motion, no tenderness, no bony tenderness and no deformity.       Left shoulder: She exhibits deformity. She exhibits normal range of motion, no tenderness and no bony tenderness.  Neurological: She is alert and oriented to person, place, and time. Gait abnormal.  Motor strength is at least 4 plus bilateral knee extensors and ankle dorsiflexors, hip flexion, testing limited by body habitus  Upper extremity 5/5 deltoid, biceps, triceps, grip  Psychiatric: She has a normal mood and affect. Her behavior is normal. Judgment and thought content normal.  Nursing note and vitals reviewed.  Morbidly obese female wearing oxygen in a wheelchair         Assessment & Plan:  1.  Chronic OA bilateral knees non operative due to BMI Had good effect from prior knee injection, not tried for several years Will need US guidance due to obesity, right side more symptomatic than left.  MME 105mg /day- discussed reducing Hydrocodone to 7.5mg  after injection, goal is to reduce overall morphine to < 90 MME Discussed aging and drug metabolism, discussed that due to slowing metabolism. She may not need same doses of medications as in years past  MSContin 30mg  BID Hydrocodone 10mg  TID  The [place] control substances reporting system was reviewed.  There currently is no evidence of multiple prescribers for opiates and other controlled substances. Patients on chronic opioids will have this reviewed at minimum every 3 months.  Continue opioid monitoring program. This consists of regular clinic visits, examinations, urine drug screen, pill counts as well as use of [place] controlled substance reporting System. Last UDS 07/27/2016, repeat today

## 2017-02-10 ENCOUNTER — Ambulatory Visit: Payer: Medicare Other

## 2017-02-10 ENCOUNTER — Encounter: Payer: Self-pay | Admitting: Internal Medicine

## 2017-02-10 ENCOUNTER — Ambulatory Visit (INDEPENDENT_AMBULATORY_CARE_PROVIDER_SITE_OTHER): Payer: Medicare Other | Admitting: Internal Medicine

## 2017-02-10 ENCOUNTER — Ambulatory Visit: Payer: Medicare Other | Admitting: Physical Medicine & Rehabilitation

## 2017-02-10 VITALS — BP 132/72 | HR 85

## 2017-02-10 DIAGNOSIS — G4733 Obstructive sleep apnea (adult) (pediatric): Secondary | ICD-10-CM | POA: Diagnosis not present

## 2017-02-10 DIAGNOSIS — J449 Chronic obstructive pulmonary disease, unspecified: Secondary | ICD-10-CM | POA: Diagnosis not present

## 2017-02-10 NOTE — Patient Instructions (Signed)
Change CPAP pressure to 8 CM h20

## 2017-02-10 NOTE — Progress Notes (Signed)
[facility] Consultation      Date: 02/10/2017,   MRN# [id number] Theresa Huffman 04-Dec-1945 Code Status:  Code Status History    Date Active Date Inactive Code Status Order ID Comments User Context   06/02/2016  9:49 AM 06/02/2016  3:20 PM Full Code [id number]  [name], MD Inpatient     Hosp day:@LENGTHOFSTAYDAYS @ Referring MD: @ATDPROV @     PCP:      Admission                  Current  Theresa Huffman is a 71 y.o. old female seen in consultation for SOB at the request of Dr. Deborra Medina.     CHIEF COMPLAINT:   Follow SOB/follow up OSA   HISTORY OF PRESENT ILLNESS   71 yo morbidly obese white female seen today for follow up OSA   first OV  Has been going on for 8 months and has progressively worsened in the past 2 months Has worsened with exertion patient has associated intermittent wheezing with cough, also with associated headaches and dizziness. Upon further assessment, patient had dropped Oxygen sats to 84% with very mild exertion, patient immediately placed on oxygen    Previous  OV-She was not actively in distress, patient has no signs of infection at this time Patient is nonsmoker, but has extensive second hand smoking history for approx 26  Years Started BREO last visit and she does not have the resp capacity to inihale CT chest 04/2016 shows MILD LAD ECHO 04/2016 shows grade 1 diastolic dysfunction SLeep study confrims sleep apnea   CURRENT OV 4/13 Doing really well with OSA therapy 100% compliance AHI 0.2 No leaks Patient claims to much pressure is blowing out RT ear No signs of infection at this time On chronic oxygen therapy Current on chornic o  MEDICATIONS    Home Medication:  Current Outpatient Rx  . Order #: [id number] Class: Normal  . Order #: [id number] Class: Historical Med  . Order #: [id number] Class: Normal  . Order #: [id number] Class: Normal  . Order #: [id number] Class: Historical Med  . Order #: [id number] Class:  Normal  . Order #: [id number] Class: Normal  . Order #: [id number] Class: No Print  . Order #: [id number] Class: Print  . Order #: [id number] Class: Normal  . Order #: [id number] Class: Print  . Order #: [id number] Class: Normal  . Order #: [id number] Class: Print  . Order #: [id number] Class: Normal  . Order #: [id number] Class: Normal  . Order #: [id number] Class: Normal  . Order #: [id number] Class: Normal  . Order #: [id number] Class: Normal    Current Medication:  Current Outpatient Prescriptions:  .  amLODipine (NORVASC) 5 MG tablet, TAKE 1 TABLET BY MOUTH DAILY, Disp: 90 tablet, Rfl: 0 .  aspirin EC 81 MG tablet, Take 81 mg by mouth daily. , Disp: , Rfl:  .  baclofen (LIORESAL) 10 MG tablet, TAKE ONE (1) TABLET BY MOUTH THREE (3) TIMES EACH DAY, Disp: 90 each, Rfl: 2 .  budesonide (PULMICORT) 0.5 MG/2ML nebulizer solution, Take 2 mLs (0.5 mg total) by nebulization 2 (two) times daily., Disp: 120 mL, Rfl: 12 .  Calcium Carbonate-Vitamin D 600-400 MG-UNIT per tablet, Take 1 tablet by mouth daily. 1200mg  once daily, Disp: , Rfl:  .  furosemide (LASIX) 40 MG tablet, Take 1 tablet (40 mg total) by mouth 2 (two) times daily., Disp: 180 tablet, Rfl: 0 .  gabapentin (NEURONTIN) 300 MG capsule, TAKE TWO CAPSULES BY MOUTH THREE TIMES DAILY, Disp: 180 capsule, Rfl: 1 .  glipiZIDE (GLUCOTROL) 10 MG tablet, TAKE 1/2 TABLET BY MOUTH TWICE DAILY BEFORE A MEAL, Disp: 180 tablet, Rfl: 3 .  HYDROcodone-acetaminophen (NORCO) 10-325 MG tablet, Take 1 tablet by mouth 3 (three) times daily. As needed for back or leg pain, Disp: 90 tablet, Rfl: 0 .  ibuprofen (ADVIL,MOTRIN) 800 MG tablet, TAKE ONE (1) TABLET BY MOUTH THREE (3) TIMES EACH DAY, Disp: 90 tablet, Rfl: 6 .  Insulin NPH, Human,, Isophane, (HUMULIN N) 100 UNIT/ML Kiwkpen, Inject units 20 units before breakfast and 15 units before bedtime (Patient taking differently: Inject units 25 units before breakfast and 20 units before bedtime), Disp: 15 mL, Rfl: 2 .  metFORMIN  (GLUCOPHAGE) 1000 MG tablet, Take 1 tablet (1,000 mg total) by mouth 2 (two) times daily with a meal., Disp: 60 tablet, Rfl: 11 .  morphine (MS CONTIN) 30 MG 12 hr tablet, Take 1 tablet (30 mg total) by mouth every 12 (twelve) hours., Disp: 60 tablet, Rfl: 0 .  potassium chloride SA (K-DUR,KLOR-CON) 20 MEQ tablet, TAKE 1 TABLET BY MOUTH DAILY, Disp: 90 tablet, Rfl: 0 .  PROAIR HFA 108 (90 Base) MCG/ACT inhaler, INHALE 2 PUFFS INTO THE LUNGS EVERY 4 HOURS AS NEEDED, Disp: 8.5 g, Rfl: 3 .  simvastatin (ZOCOR) 20 MG tablet, Take 1 tablet (20 mg total) by mouth daily., Disp: 90 tablet, Rfl: 1 .  SYNTHROID 200 MCG tablet, TAKE ONE TABLET BY MOUTH EVERY MORNING BEFORE BREAKFAST *KEEP/MAKE APPOINTMENT FOR FURTHER REFILLS*, Disp: 30 tablet, Rfl: 3 .  venlafaxine (EFFEXOR) 75 MG tablet, TAKE THREE TABLETS BY MOUTH EVERY NIGHT AT BEDTIME, Disp: 90 tablet, Rfl: 1    ALLERGIES   Ace inhibitors; Erythromycin; Invokana [canagliflozin]; and Other     REVIEW OF SYSTEMS   Review of Systems  Constitutional: Negative for chills, diaphoresis, fever, malaise/fatigue and weight loss.  HENT: Negative for congestion and hearing loss.   Eyes: Negative for blurred vision and double vision.  Respiratory: Negative for cough, hemoptysis, sputum production, shortness of breath and wheezing.   Cardiovascular: Positive for leg swelling. Negative for chest pain, palpitations and orthopnea.  Gastrointestinal: Negative for abdominal pain, heartburn, nausea and vomiting.  Skin: Negative for rash.  Neurological: Negative for weakness.  All other systems reviewed and are negative.   BP 132/72 (BP Location: Left Arm, Cuff Size: Normal)   Pulse 85   SpO2 99%    PHYSICAL EXAM  Physical Exam  Constitutional: She is oriented to person, place, and time. She appears well-developed and well-nourished. No distress.  Eyes: No scleral icterus.  Neck: Neck supple.  Cardiovascular: Normal rate, regular rhythm and normal heart  sounds.   No murmur heard. Pulmonary/Chest: Effort normal and breath sounds normal. No stridor. No respiratory distress. She has no wheezes.  Musculoskeletal: Normal range of motion. She exhibits edema.  Neurological: She is alert and oriented to person, place, and time. No cranial nerve deficit.  Skin: Skin is warm. She is not diaphoretic.  Psychiatric: She has a normal mood and affect.      ASSESSMENT/PLAN   71 yo morbidly obese white female with SOB is now with dx of chronic hypoxic resp failure-etiology is multifactorial including her morbid obesity with underlying OSA/OHS with reactive airways disease probable COPD from second hand smoke exposure in the setting of Grade 1 Diastolic cardiac dysfunction  1. oxygen with exertion and as needed 2.continue CPAP therapy and change to  8 cm h20 for OSA therapy(current pressure is 11 cm h2o) 3.continue  Pulmicort Neb  therapy 4.albuterol as needed 5.continue lasix as prescribed  Follow up in 3 months     Patient/Family are satisfied with Plan of action and management. All questions answered  Corrin Parker, M.D.  [facility]  Medical Director [place] Director [facility] Department

## 2017-02-14 LAB — 6-ACETYLMORPHINE,TOXASSURE ADD
6-ACETYLMORPHINE: NEGATIVE
6-acetylmorphine: NOT DETECTED ng/mg creat

## 2017-02-14 LAB — TOXASSURE SELECT,+ANTIDEPR,UR

## 2017-02-16 ENCOUNTER — Telehealth: Payer: Self-pay | Admitting: *Deleted

## 2017-02-16 NOTE — Telephone Encounter (Signed)
Urine drug screen for this encounter is consistent for prescribed medication. They have listed hydrocodone as unexpected in error as she is being prescribed hydrocodone.

## 2017-02-24 ENCOUNTER — Encounter: Payer: Self-pay | Admitting: Internal Medicine

## 2017-02-24 ENCOUNTER — Ambulatory Visit (INDEPENDENT_AMBULATORY_CARE_PROVIDER_SITE_OTHER): Payer: Medicare Other | Admitting: Internal Medicine

## 2017-02-24 VITALS — BP 162/61 | HR 91 | Resp 14 | Wt 394.0 lb

## 2017-02-24 DIAGNOSIS — Z794 Long term (current) use of insulin: Secondary | ICD-10-CM | POA: Diagnosis not present

## 2017-02-24 DIAGNOSIS — E1165 Type 2 diabetes mellitus with hyperglycemia: Secondary | ICD-10-CM | POA: Diagnosis not present

## 2017-02-24 DIAGNOSIS — E1140 Type 2 diabetes mellitus with diabetic neuropathy, unspecified: Secondary | ICD-10-CM

## 2017-02-24 DIAGNOSIS — IMO0002 Reserved for concepts with insufficient information to code with codable children: Secondary | ICD-10-CM

## 2017-02-24 NOTE — Progress Notes (Addendum)
Patient ID: Theresa Huffman, female   DOB: May 05, 1946, 71 y.o.   MRN: [id number]  HPI: Theresa Huffman is a 71 y.o.-year-old female, returning for f/u for DM2, dx 2010, insulin-dependent, uncontrolled, with complications (Peripheral neuropathy, mild CKD) and hypothyroidism., dx 2010, insulin-dependent, uncontrolled, with complications (Peripheral neuropathy, mild CKD) and hypothyroidism. Last visit 3 mo ago.   DM2: Last hemoglobin A1c was: Lab Results  Component Value Date   HGBA1C 6.9 (H) 01/24/2017   HGBA1C 6.0 11/29/2016   HGBA1C 6.5 08/30/2016  Prev. HbA1C was 8.5% in 04/2011. She believes her HbA1c increased due to getting back to eating sweats.   Pt is on a regimen of: - Metformin 1000 mg 2x a day - NPH (Insulin N): - 25 units in am  - 20 units at bedtime - Glipizide dose before b'fast and dinner (10 mg). She was on Invokana 300 mg -  she had run out of it >> restarted >> developed a yeast inf >> stopped it 08/26/2014 We started Cycloset - 1.6 mg >> ran out of it and did not refill We had to stop Januvia 100 - expensive. She is in the doughnut hole by the end of the year.   Pt checks her sugars once a day and they are: - am:  176, 194-352 >> 87, 100--146 >> 79, 83, 94-145 >> 96, 94-143, 165, 227 >> 95-140 >> 80-166, 217 - before lunch: 208 >> 186 >> n/c - before dinner: 125-159 >> 114-163 >> n/c >> 97-150 >> 103-122 >> 104 >> 130 >> 79-136 - 2h after dinner: 225 >> 139-189 >> n/c - bedtime: 142-187 >> n/c >> 93-182, 206 if eats out  >> 127-186 >> 85, 104-158 >> 74, 89-160, 202 >> 77-208, 265 - nighttime: 153 >> n/c >> 68, 104, 108 No lows. Lowest sugar was 87 >> 79 >> 76 >> 74 >> 68; she has hypoglycemia awareness at 80.  Highest sugar was low 200s >> 227 > 202 >> 265.  - has mild CKD, last BUN/creatinine:  Lab Results  Component Value Date   BUN 17 01/24/2017   CREATININE 0.81 01/24/2017  Has h/o angioedema - ACEI stopped - last set of lipids: Lab Results  Component Value Date   CHOL 175 01/24/2017   HDL 69.40 01/24/2017   LDLCALC 81 01/24/2017   LDLDIRECT 92.5 08/25/2014    TRIG 122.0 01/24/2017   CHOLHDL 3 01/24/2017  She is on Zocor. She takes ASA 81. - last eye exam was in 12/2016. No DR.  - + numbness and tingling in her feet - improved   Pt also has a history of HTN, Hypothyroidism, vit D def., HL, morbid obesity, lymphedema, vv insuff., GERD, lumbago, chronic bilateral knee pain. No h/o UTIs. She was considering GBP, but is afraid of the sx.   Hypothyroidism: - dx 2001 - on Synthroid DAW 175 >> 200 mcg daily: - in am - fasting - at least 30 min from b'fast - no Fe, MVI, PPIs - + Ca at night - not on Biotin  - reviewed TFTs: Lab Results  Component Value Date   TSH 0.75 01/24/2017   TSH 2.32 04/13/2016   TSH 4.27 12/15/2015   TSH 3.57 04/23/2015   TSH 5.83 (H) 03/10/2015   FREET4 1.20 12/15/2015   FREET4 1.19 04/23/2015   FREET4 1.09 03/10/2015   FREET4 1.37 02/13/2012   I reviewed pt's medications, allergies, PMH, social hx, family hx, and changes were documented in the history of present illness. Otherwise, unchanged from my initial visit note.  ROS: Constitutional: no weight  gain/no weight loss, no fatigue, no subjective hyperthermia, no subjective hypothermia Eyes: no blurry vision, no xerophthalmia ENT: no sore throat, no nodules palpated in throat, no dysphagia, no odynophagia, no hoarseness Cardiovascular: no CP/+ SOB/no palpitations/+ leg swelling Respiratory: no cough/+ SOB/no wheezing Gastrointestinal: no N/no V/no D/no C/no acid reflux Musculoskeletal: + muscle aches/+ joint aches Skin: no rashes, + hair loss Neurological: no tremors/no numbness/no tingling/no dizziness  PE: BP (!) 162/61   Pulse 91   Resp 14   Wt (!) 394 lb (178.7 kg)   SpO2 94%   BMI 67.63 kg/m  Body mass index is 67.63 kg/m.  Wt Readings from Last 3 Encounters:  07/13/17 (!) 380 lb (172.4 kg)  02/24/17 (!) 394 lb (178.7 kg)  01/30/17 (!) 394 lb (178.7 kg)   Constitutional: obesity grade 3, in NAD, + oxygen 2 LPM Eyes: PERRLA, EOMI, no  exophthalmos ENT: moist mucous membranes, no thyromegaly, no cervical lymphadenopathy Cardiovascular: RRR, + 1/6 SEM, no RG, + LE edema (lymphedema) Respiratory: CTA B Gastrointestinal: abdomen soft, NT, ND, BS+ Musculoskeletal: no deformities, strength intact in all 4 Skin: moist, warm, no rashes Neurological: no tremor with outstretched hands, DTR normal in all 4  ASSESSMENT: 1. DM2, insulin-dependent, uncontrolled, with complications - PN, on Neurontin  2. Hypothyroidism - On d.a.w. Synthroid    PLAN:  1. Patient with long-standing, uncontrolled diabetes, with initially better control after starting NPH insulin, now higher sugars 2/2 dietary indiscretions - snacks at night. Strongly advised her to decrease/stop these. OTW, since she has hyperglycemic spikes only >> no change in regimen needed. She is on max dose of Glipizide 2x a day >> advised her to let me know if she has any lows >> will need to decrease the dose. - I advised her to:  Patient Instructions  Please continue: - Metformin 1000 mg 2x a day - NPH (Insulin N): - 25 units in am  - 20 units at bedtime - Glipizide 10 mg bfore b'fast and dinner  Please return in 3 months with your sugar log.   - advised her to continue checking her sugars at different times of the day - check 2x a day, rotating checks - Needs a new eye exam >> she is UTD - Last HbA1c >> 6.9% (higher) - Return to clinic in 3 mo with sugar log  2. Hypothyroidism - Reviewed the latest thyroid labs:normal 1 months ago - on levothyroxine 200 mcg daily - discussed about taking the thyroid hormone every day, with water, >30 minutes before breakfast, separated by >4 hours from acid reflux medications, calcium, iron, multivitamins. She is taking it correctly - continue 200 mcg of Synthroid daily  Philemon Kingdom, MD PhD [facility] Endocrinology

## 2017-02-24 NOTE — Patient Instructions (Addendum)
Please continue: - Metformin 1000 mg 2x a day - NPH (Insulin N): - 25 units in am  - 20 units at bedtime - Glipizide 10 mg bfore b'fast and dinner  Please return in 3 months with your sugar log.

## 2017-03-06 ENCOUNTER — Other Ambulatory Visit: Payer: Self-pay | Admitting: Family Medicine

## 2017-03-06 DIAGNOSIS — Z1211 Encounter for screening for malignant neoplasm of colon: Secondary | ICD-10-CM | POA: Diagnosis not present

## 2017-03-06 DIAGNOSIS — Z1212 Encounter for screening for malignant neoplasm of rectum: Secondary | ICD-10-CM | POA: Diagnosis not present

## 2017-03-07 ENCOUNTER — Encounter: Payer: Self-pay | Admitting: Family Medicine

## 2017-03-09 ENCOUNTER — Encounter: Payer: Medicare Other | Attending: Physical Medicine & Rehabilitation

## 2017-03-09 ENCOUNTER — Ambulatory Visit (HOSPITAL_BASED_OUTPATIENT_CLINIC_OR_DEPARTMENT_OTHER): Payer: Medicare Other | Admitting: Physical Medicine & Rehabilitation

## 2017-03-09 ENCOUNTER — Encounter: Payer: Self-pay | Admitting: Physical Medicine & Rehabilitation

## 2017-03-09 VITALS — BP 145/75 | HR 85

## 2017-03-09 DIAGNOSIS — M7541 Impingement syndrome of right shoulder: Secondary | ICD-10-CM | POA: Insufficient documentation

## 2017-03-09 DIAGNOSIS — Z79899 Other long term (current) drug therapy: Secondary | ICD-10-CM | POA: Diagnosis not present

## 2017-03-09 DIAGNOSIS — G8929 Other chronic pain: Secondary | ICD-10-CM | POA: Diagnosis not present

## 2017-03-09 DIAGNOSIS — M25561 Pain in right knee: Secondary | ICD-10-CM | POA: Insufficient documentation

## 2017-03-09 DIAGNOSIS — M25562 Pain in left knee: Secondary | ICD-10-CM | POA: Insufficient documentation

## 2017-03-09 DIAGNOSIS — M174 Other bilateral secondary osteoarthritis of knee: Secondary | ICD-10-CM

## 2017-03-09 DIAGNOSIS — Z5181 Encounter for therapeutic drug level monitoring: Secondary | ICD-10-CM | POA: Insufficient documentation

## 2017-03-09 DIAGNOSIS — M75102 Unspecified rotator cuff tear or rupture of left shoulder, not specified as traumatic: Secondary | ICD-10-CM | POA: Insufficient documentation

## 2017-03-09 MED ORDER — MORPHINE SULFATE ER 30 MG PO TBCR: 30.0000 mg | EXTENDED_RELEASE_TABLET | Freq: Two times a day (BID) | ORAL | 0 refills | Status: DC

## 2017-03-09 MED ORDER — HYDROCODONE-ACETAMINOPHEN 7.5-325 MG PO TABS: 1.0000 | ORAL_TABLET | Freq: Three times a day (TID) | ORAL | 0 refills | Status: DC

## 2017-03-09 NOTE — Patient Instructions (Addendum)
The right knee pain will improve after the injection, but may take one to 2 days. You are on a slightly lower dose of hydrocodone since pain will reduce after injection. Also goal is to achieve less than 90 mg of morphine equivalent per day to improve safety profile of chronic narcotic  We can repeat right knee injection in 3 months

## 2017-03-09 NOTE — Progress Notes (Signed)
RIGHT Knee injection ultrasound guidance used due to morbid obesity  Indication:Right Knee pain due to end stage osteoarthritis not relieved by medication management and other conservative care.  Informed consent was obtained after describing risks and benefits of the procedure with the patient, this includes bleeding, bruising, infection and medication side effects. The patient wishes to proceed and has given written consent. The patient was placed in a recumbent position. The medial aspect of the knee was marked and prepped with Betadine and alcohol. It was then entered with a 25-gauge 1-1/2 inch needle and 1 mL of 1% lidocaine was injected into the skin and subcutaneous tissue. Then another 22g 80 mm ECHO bloc needle was inserted into the knee joint. After negative draw back for blood, a solution containing one ML of 6mg  per mL betamethasone and 3 mL of 1% lidocaine were injected. The patient tolerated the procedure well. Post procedure instructions were given.

## 2017-03-13 LAB — COLOGUARD

## 2017-03-22 ENCOUNTER — Telehealth: Payer: Self-pay

## 2017-03-22 NOTE — Telephone Encounter (Signed)
Pt was notified of cologuard results, pt verbalized understanding.

## 2017-04-08 ENCOUNTER — Encounter: Payer: Self-pay | Admitting: Internal Medicine

## 2017-04-16 ENCOUNTER — Other Ambulatory Visit: Payer: Self-pay | Admitting: Internal Medicine

## 2017-04-17 ENCOUNTER — Encounter: Payer: Self-pay | Admitting: Registered Nurse

## 2017-04-17 ENCOUNTER — Encounter: Payer: Medicare Other | Attending: Physical Medicine & Rehabilitation | Admitting: Registered Nurse

## 2017-04-17 ENCOUNTER — Telehealth: Payer: Self-pay | Admitting: Registered Nurse

## 2017-04-17 VITALS — BP 137/59 | HR 95

## 2017-04-17 DIAGNOSIS — G8929 Other chronic pain: Secondary | ICD-10-CM | POA: Diagnosis not present

## 2017-04-17 DIAGNOSIS — Z79899 Other long term (current) drug therapy: Secondary | ICD-10-CM | POA: Diagnosis not present

## 2017-04-17 DIAGNOSIS — Z5181 Encounter for therapeutic drug level monitoring: Secondary | ICD-10-CM | POA: Insufficient documentation

## 2017-04-17 DIAGNOSIS — M47817 Spondylosis without myelopathy or radiculopathy, lumbosacral region: Secondary | ICD-10-CM

## 2017-04-17 DIAGNOSIS — M25511 Pain in right shoulder: Secondary | ICD-10-CM

## 2017-04-17 DIAGNOSIS — M25561 Pain in right knee: Secondary | ICD-10-CM | POA: Diagnosis not present

## 2017-04-17 DIAGNOSIS — M25562 Pain in left knee: Secondary | ICD-10-CM | POA: Insufficient documentation

## 2017-04-17 DIAGNOSIS — G894 Chronic pain syndrome: Secondary | ICD-10-CM

## 2017-04-17 DIAGNOSIS — M7541 Impingement syndrome of right shoulder: Secondary | ICD-10-CM | POA: Insufficient documentation

## 2017-04-17 DIAGNOSIS — M75102 Unspecified rotator cuff tear or rupture of left shoulder, not specified as traumatic: Secondary | ICD-10-CM | POA: Insufficient documentation

## 2017-04-17 MED ORDER — MORPHINE SULFATE ER 30 MG PO TBCR: 30.0000 mg | EXTENDED_RELEASE_TABLET | Freq: Two times a day (BID) | ORAL | 0 refills | Status: DC

## 2017-04-17 MED ORDER — HYDROCODONE-ACETAMINOPHEN 7.5-325 MG PO TABS: 1.0000 | ORAL_TABLET | Freq: Three times a day (TID) | ORAL | 0 refills | Status: DC

## 2017-04-17 NOTE — Telephone Encounter (Signed)
On 04/17/2017 the  Theresa Huffman was reviewed no conflict was seen on the [place] with multiple prescribers. [name] has a signed narcotic contract with our office. If there were any discrepancies this would have been reported to her physician.

## 2017-04-17 NOTE — Progress Notes (Signed)
Subjective:    Patient ID: Theresa Huffman, female    DOB: December 20, 1945, 71 y.o.   MRN: [id number]  HPI: Theresa Huffman is a 71year old female who returns for follow up appointmentfor chronic pain and medication refill. She states her pain is located in her  lower back and rightknee. She rates her pain 7. Her current exercise regime is walking short distances using loftstrand crutches in her home and performing chair exercises.  S/P Right Knee Injection with relief noted.  Husband in room.  Last UDS was performed on 02/09/2017 it was consistent.    Pain Inventory Average Pain 7 Pain Right Now 7 My pain is burning and aching  In the last 24 hours, has pain interfered with the following? General activity 8 Relation with others 6 Enjoyment of life 7 What TIME of day is your pain at its worst? morning Sleep (in general) Poor  Pain is worse with: walking, bending, standing and some activites Pain improves with: rest and medication Relief from Meds: 7  Mobility use a cane how many minutes can you walk? 10 ability to climb steps?  no do you drive?  yes use a wheelchair transfers alone  Function disabled: date disabled . I need assistance with the following:  meal prep, household duties and shopping  Neuro/Psych weakness numbness trouble walking depression  Prior Studies Any changes since last visit?  no  Physicians involved in your care Any changes since last visit?  no   Family History  Problem Relation Age of Onset  . Cancer Mother        lung and colon  . Stroke Sister    Social History   Social History  . Marital status: Married    Spouse name: N/A  . Number of children: 1  . Years of education: N/A   Occupational History  . Diability Unemployed   Social History Main Topics  . Smoking status: Never Smoker  . Smokeless tobacco: Never Used  . Alcohol use No  . Drug use: No  . Sexual activity: Not Asked   Other Topics Concern    . None   Social History Narrative   Lives in [place], one adult child      Caffeine use: coffee daily in am   Past Surgical History:  Procedure Laterality Date  . CARDIAC CATHETERIZATION N/A 06/02/2016   Procedure: Left Heart Cath and Coronary Angiography;  Surgeon: [name], MD;  Location: [facility];  Service: Cardiovascular;  Laterality: N/A;  . CHOLECYSTECTOMY    . TUBAL LIGATION     Past Medical History:  Diagnosis Date  . CAD (coronary artery disease)    a. Lexiscan 05/12/16: smal defect of moderate severity in the mid anterior and apical location opartially reversible at rest. EF 42%. TID ratio uincreased at 1.23 possibly c/w balanced ischemia. Frequent PVCs  . Diabetes mellitus type 2 in obese ([place])   . Essential hypertension   . Facet syndrome, lumbar ([place])   . GERD (gastroesophageal reflux disease)   . Hyperlipidemia   . Hypothyroidism   . Low back pain   . Lumbago   . Lumbosacral spondylosis without myelopathy   . Lymphedema   . Lymphedema   . Morbid obesity ([place])   . Pain in limb   . Primary localized osteoarthrosis, lower leg    BP (!) 137/59   Pulse 95   SpO2 94% Comment: 2 L [place]  Opioid Risk Score:   Fall  Risk Score:  `1  Depression screen PHQ 2/9  Depression screen [facility] 2/9 04/17/2017 01/24/2017 09/26/2016 04/20/2016 01/26/2016 11/24/2015 07/16/2015  Decreased Interest 1 0 0 0 2 0 0  Down, Depressed, Hopeless 1 0 3 0 1 1 1   PHQ - 2 Score 2 0 3 0 3 1 1   Altered sleeping - - 0 - 3 - 1  Tired, decreased energy - - 3 - 3 - 1  Change in appetite - - 3 - 2 - 1  Feeling bad or failure about yourself  - - 2 - 2 - 1  Trouble concentrating - - 0 - 0 - 0  Moving slowly or fidgety/restless - - 3 - 0 - 0  Suicidal thoughts - - 0 - 0 - 0  PHQ-9 Score - - 14 - 13 - 5  Difficult doing work/chores - - Not difficult at all - Somewhat difficult - Somewhat difficult  Some recent data might be hidden   Review of Systems  Constitutional: Negative.    HENT: Negative.   Eyes: Negative.   Respiratory: Negative.   Cardiovascular: Negative.   Gastrointestinal: Negative.   Endocrine: Negative.   Genitourinary: Negative.   Musculoskeletal: Positive for gait problem.  Skin: Negative.   Allergic/Immunologic: Negative.   Neurological: Positive for numbness.  Hematological: Negative.   Psychiatric/Behavioral: Positive for dysphoric mood.  All other systems reviewed and are negative.      Objective:   Physical Exam  Constitutional: She is oriented to person, place, and time. She appears well-developed and well-nourished.  HENT:  Head: Normocephalic and atraumatic.  Neck: Normal range of motion. Neck supple.  Cardiovascular: Normal rate and regular rhythm.   Pulmonary/Chest: Effort normal and breath sounds normal.  Musculoskeletal:  Normal Muscle Bulk and Muscle Testing Reveals: Upper Extremities: Full ROM and Muscle Strength 5/5 Lumbar Paraspinal Tenderness: L-3-L-5 Lower Extremities: Full ROM and Muscle Strength 5/5 Arrived in wheelchair  Neurological: She is alert and oriented to person, place, and time.  Skin: Skin is warm and dry.  Psychiatric: She has a normal mood and affect.  Nursing note and vitals reviewed.         Assessment & Plan:  1. Lumbar pain chronic, no radiculopathy: Lumbar degenerative disc L4-5 and L5-S1. 04/17/2017 Refilled: HYDRcodone 7.5/325mg  one tablet TID #90.  MS CONTIN 30 mg one every 12 hours #60.  We will continue the opioid monitoring program,this consists of regular clinic visits, examinations, urine drug screen, pill counts as well as use of [place] Controlled Substance reporting System. Continue Gabapentin. 2. Bilateral knee osteoarthritis: S/P Right Knee Injection with relief noted. Continue with Exercise and Heat therapy. Continue current medication regime. 04/17/2017 3. Morbid Obesity: Continue with Diabetic Diet Regime and Exercise Regimen. 04/17/2017 4. Muscle Spasms:  Continue Baclofen. 04/17/2017  20 minutes of face to face patient care time was spent during this visit. All questions were encouraged and answered.   F/U in 1 month

## 2017-04-24 ENCOUNTER — Other Ambulatory Visit: Payer: Self-pay | Admitting: Registered Nurse

## 2017-04-25 ENCOUNTER — Other Ambulatory Visit: Payer: Self-pay | Admitting: *Deleted

## 2017-04-25 MED ORDER — AMLODIPINE BESYLATE 5 MG PO TABS: 5.0000 mg | ORAL_TABLET | Freq: Every day | ORAL | 2 refills | Status: AC

## 2017-04-25 MED ORDER — POTASSIUM CHLORIDE CRYS ER 20 MEQ PO TBCR: 20.0000 meq | EXTENDED_RELEASE_TABLET | Freq: Every day | ORAL | 2 refills | Status: AC

## 2017-05-18 ENCOUNTER — Ambulatory Visit: Payer: Medicare Other | Admitting: Registered Nurse

## 2017-05-18 ENCOUNTER — Encounter: Payer: Medicare Other | Attending: Physical Medicine & Rehabilitation | Admitting: Registered Nurse

## 2017-05-18 ENCOUNTER — Encounter: Payer: Self-pay | Admitting: Registered Nurse

## 2017-05-18 VITALS — BP 156/80 | HR 88

## 2017-05-18 DIAGNOSIS — G894 Chronic pain syndrome: Secondary | ICD-10-CM | POA: Diagnosis not present

## 2017-05-18 DIAGNOSIS — Z5181 Encounter for therapeutic drug level monitoring: Secondary | ICD-10-CM

## 2017-05-18 DIAGNOSIS — M25561 Pain in right knee: Secondary | ICD-10-CM | POA: Diagnosis not present

## 2017-05-18 DIAGNOSIS — M75102 Unspecified rotator cuff tear or rupture of left shoulder, not specified as traumatic: Secondary | ICD-10-CM | POA: Insufficient documentation

## 2017-05-18 DIAGNOSIS — M47817 Spondylosis without myelopathy or radiculopathy, lumbosacral region: Secondary | ICD-10-CM

## 2017-05-18 DIAGNOSIS — M25562 Pain in left knee: Secondary | ICD-10-CM | POA: Insufficient documentation

## 2017-05-18 DIAGNOSIS — M25511 Pain in right shoulder: Secondary | ICD-10-CM

## 2017-05-18 DIAGNOSIS — M7541 Impingement syndrome of right shoulder: Secondary | ICD-10-CM | POA: Insufficient documentation

## 2017-05-18 DIAGNOSIS — Z79899 Other long term (current) drug therapy: Secondary | ICD-10-CM | POA: Diagnosis not present

## 2017-05-18 DIAGNOSIS — M25512 Pain in left shoulder: Secondary | ICD-10-CM | POA: Diagnosis not present

## 2017-05-18 DIAGNOSIS — G8929 Other chronic pain: Secondary | ICD-10-CM | POA: Diagnosis not present

## 2017-05-18 MED ORDER — HYDROCODONE-ACETAMINOPHEN 7.5-325 MG PO TABS: 1.0000 | ORAL_TABLET | Freq: Three times a day (TID) | ORAL | 0 refills | Status: DC

## 2017-05-18 MED ORDER — MORPHINE SULFATE ER 30 MG PO TBCR: 30.0000 mg | EXTENDED_RELEASE_TABLET | Freq: Two times a day (BID) | ORAL | 0 refills | Status: DC

## 2017-05-18 NOTE — Progress Notes (Signed)
Subjective:    Patient ID: Theresa Huffman, female    DOB: Jan 03, 1946, 71 y.o.   MRN: [id number]  HPI: [name] is a 71year old female who returns for follow up appointmentfor chronic pain and medication refill. She states her pain is located in her bilateral shoulders R.L,lower back and rightknee. She rates her pain 7. Her current exercise regime is walking short distances using loftstrand crutches in her home and performing chair exercises daily.  Urine drug screen performed on 02/09/2017, it was consistent.   Pain Inventory Average Pain 7 Pain Right Now 7 My pain is burning and aching  In the last 24 hours, has pain interfered with the following? General activity 0 Relation with others 0 Enjoyment of life 0 What TIME of day is your pain at its worst? morning, evening Sleep (in general) Poor  Pain is worse with: walking, bending and standing Pain improves with: rest and medication Relief from Meds: 5  Mobility use a walker how many minutes can you walk? 10 ability to climb steps?  no do you drive?  yes use a wheelchair Do you have any goals in this area?  yes  Function disabled: date disabled n/a I need assistance with the following:  meal prep, household duties and shopping Do you have any goals in this area?  yes  Neuro/Psych weakness numbness depression  Prior Studies Any changes since last visit?  no  Physicians involved in your care Any changes since last visit?  no   Family History  Problem Relation Age of Onset  . Cancer Mother        lung and colon  . Stroke Sister    Social History   Social History  . Marital status: Married    Spouse name: N/A  . Number of children: 1  . Years of education: N/A   Occupational History  . Diability Unemployed   Social History Main Topics  . Smoking status: Never Smoker  . Smokeless tobacco: Never Used  . Alcohol use No  . Drug use: No  . Sexual activity: Not on file   Other  Topics Concern  . Not on file   Social History Narrative   Lives in [place], one adult child      Caffeine use: coffee daily in am   Past Surgical History:  Procedure Laterality Date  . CARDIAC CATHETERIZATION N/A 06/02/2016   Procedure: Left Heart Cath and Coronary Angiography;  Surgeon: [name], MD;  Location: [facility];  Service: Cardiovascular;  Laterality: N/A;  . CHOLECYSTECTOMY    . TUBAL LIGATION     Past Medical History:  Diagnosis Date  . CAD (coronary artery disease)    a. Lexiscan 05/12/16: smal defect of moderate severity in the mid anterior and apical location opartially reversible at rest. EF 42%. TID ratio uincreased at 1.23 possibly c/w balanced ischemia. Frequent PVCs  . Diabetes mellitus type 2 in obese ([place])   . Essential hypertension   . Facet syndrome, lumbar ([place])   . GERD (gastroesophageal reflux disease)   . Hyperlipidemia   . Hypothyroidism   . Low back pain   . Lumbago   . Lumbosacral spondylosis without myelopathy   . Lymphedema   . Lymphedema   . Morbid obesity ([place])   . Pain in limb   . Primary localized osteoarthrosis, lower leg    There were no vitals taken for this visit.  Opioid Risk Score:   Fall Risk  Score:  `1  Depression screen PHQ 2/9  Depression screen [facility] 2/9 04/17/2017 01/24/2017 09/26/2016 04/20/2016 01/26/2016 11/24/2015 07/16/2015  Decreased Interest 1 0 0 0 2 0 0  Down, Depressed, Hopeless 1 0 3 0 1 1 1   PHQ - 2 Score 2 0 3 0 3 1 1   Altered sleeping - - 0 - 3 - 1  Tired, decreased energy - - 3 - 3 - 1  Change in appetite - - 3 - 2 - 1  Feeling bad or failure about yourself  - - 2 - 2 - 1  Trouble concentrating - - 0 - 0 - 0  Moving slowly or fidgety/restless - - 3 - 0 - 0  Suicidal thoughts - - 0 - 0 - 0  PHQ-9 Score - - 14 - 13 - 5  Difficult doing work/chores - - Not difficult at all - Somewhat difficult - Somewhat difficult  Some recent data might be hidden     Review of Systems  Neurological:  Positive for weakness and numbness.  Psychiatric/Behavioral: Positive for dysphoric mood.  All other systems reviewed and are negative.      Objective:   Physical Exam  Constitutional: She is oriented to person, place, and time. She appears well-developed and well-nourished.  HENT:  Head: Normocephalic and atraumatic.  Neck: Normal range of motion. Neck supple.  Cardiovascular: Normal rate and regular rhythm.   Pulmonary/Chest: Effort normal and breath sounds normal.  Musculoskeletal:  Normal Muscle Bulk and Muscle Testing Reveals: Upper Extremities: Full ROM and Muscle Strength 5/5 Bilateral AC Joint Tenderness Lumbar Paraspinal Tenderness: L-3-L-5 Lower Extremities: Full ROM and Muscle Strength 5/5 Arrived via wheelchair   Neurological: She is alert and oriented to person, place, and time.  Skin: Skin is warm and dry.  Nursing note and vitals reviewed.         Assessment & Plan:  1. Lumbar pain chronic, no radiculopathy:Lumbar degenerative disc L4-5 and L5-S1. Continue Gabapentin: 05/18/2017 : Refilled: HYDRcodone 7.5/325mg  one tablet TID #90.  MS CONTIN 30 mg one every 12 hours #60.  We will continue the opioid monitoring program,this consists of regular clinic visits, examinations, urine drug screen, pill counts as well as use of [place] Controlled Substance reporting System. 2. Bilateral knee osteoarthritis: S/P Right Knee Injection with relief noted on 03/09/2017. Continue with Exercise and Heat therapy. Continue current medication regime. 05/18/2017 3. Morbid Obesity: Continue with Diabetic Diet Regime and Exercise Regimen. 05/18/2017 4. Muscle Spasms: Continue Baclofen. 05/18/2017  20 minutes of face to face patient care time was spent during this visit. All questions were encouraged and answered.  F/U in 1 month

## 2017-05-21 ENCOUNTER — Other Ambulatory Visit: Payer: Self-pay | Admitting: Internal Medicine

## 2017-05-24 ENCOUNTER — Ambulatory Visit: Payer: Medicare Other | Admitting: Registered Nurse

## 2017-06-04 ENCOUNTER — Other Ambulatory Visit: Payer: Self-pay | Admitting: Physical Medicine & Rehabilitation

## 2017-06-04 ENCOUNTER — Other Ambulatory Visit: Payer: Self-pay | Admitting: Family Medicine

## 2017-06-13 ENCOUNTER — Ambulatory Visit: Payer: Medicare Other | Admitting: Internal Medicine

## 2017-06-14 ENCOUNTER — Encounter: Payer: Self-pay | Admitting: Registered Nurse

## 2017-06-14 ENCOUNTER — Encounter: Payer: Medicare Other | Attending: Physical Medicine & Rehabilitation | Admitting: Registered Nurse

## 2017-06-14 VITALS — BP 149/79 | HR 98

## 2017-06-14 DIAGNOSIS — M7541 Impingement syndrome of right shoulder: Secondary | ICD-10-CM | POA: Insufficient documentation

## 2017-06-14 DIAGNOSIS — M25511 Pain in right shoulder: Secondary | ICD-10-CM | POA: Diagnosis not present

## 2017-06-14 DIAGNOSIS — Z79899 Other long term (current) drug therapy: Secondary | ICD-10-CM

## 2017-06-14 DIAGNOSIS — M25562 Pain in left knee: Secondary | ICD-10-CM | POA: Insufficient documentation

## 2017-06-14 DIAGNOSIS — M47817 Spondylosis without myelopathy or radiculopathy, lumbosacral region: Secondary | ICD-10-CM

## 2017-06-14 DIAGNOSIS — Z5181 Encounter for therapeutic drug level monitoring: Secondary | ICD-10-CM | POA: Insufficient documentation

## 2017-06-14 DIAGNOSIS — G8929 Other chronic pain: Secondary | ICD-10-CM | POA: Diagnosis not present

## 2017-06-14 DIAGNOSIS — M75102 Unspecified rotator cuff tear or rupture of left shoulder, not specified as traumatic: Secondary | ICD-10-CM | POA: Diagnosis not present

## 2017-06-14 DIAGNOSIS — M25561 Pain in right knee: Secondary | ICD-10-CM | POA: Diagnosis not present

## 2017-06-14 DIAGNOSIS — G894 Chronic pain syndrome: Secondary | ICD-10-CM

## 2017-06-14 MED ORDER — MORPHINE SULFATE ER 30 MG PO TBCR: 30.0000 mg | EXTENDED_RELEASE_TABLET | Freq: Two times a day (BID) | ORAL | 0 refills | Status: DC

## 2017-06-14 MED ORDER — HYDROCODONE-ACETAMINOPHEN 7.5-325 MG PO TABS: 1.0000 | ORAL_TABLET | Freq: Three times a day (TID) | ORAL | 0 refills | Status: DC

## 2017-06-14 NOTE — Progress Notes (Signed)
Subjective:    Patient ID: Theresa Huffman, female    DOB: February 04, 1946, 71 y.o.   MRN: [id number]  HPI: [name] is a 71year old female who returns for follow up appointmentfor chronic pain and medication refill. She states her pain is located in her right shoulder,lower back and rightknee. She rates her pain 7. Her current exercise regime is walking short distances using loftstrand crutches in her home and performing chair exercises daily.  Last urine drug screen was performed on 02/09/2017, it was consistent.   Pain Inventory Average Pain 7 Pain Right Now 7 My pain is burning and aching  In the last 24 hours, has pain interfered with the following? General activity 8 Relation with others 6 Enjoyment of life 6 What TIME of day is your pain at its worst? varies Sleep (in general) NA  Pain is worse with: some activites Pain improves with: medication Relief from Meds: na  Mobility walk with assistance use a wheelchair needs help with transfers  Function disabled: date disabled . I need assistance with the following:  meal prep, household duties and shopping  Neuro/Psych weakness numbness trouble walking depression  Prior Studies Any changes since last visit?  no  Physicians involved in your care Any changes since last visit?  no   Family History  Problem Relation Age of Onset  . Cancer Mother        lung and colon  . Stroke Sister    Social History   Social History  . Marital status: Married    Spouse name: N/A  . Number of children: 1  . Years of education: N/A   Occupational History  . Diability Unemployed   Social History Main Topics  . Smoking status: Never Smoker  . Smokeless tobacco: Never Used  . Alcohol use No  . Drug use: No  . Sexual activity: Not Asked   Other Topics Concern  . None   Social History Narrative   Lives in [place], one adult child      Caffeine use: coffee daily in am   Past Surgical  History:  Procedure Laterality Date  . CARDIAC CATHETERIZATION N/A 06/02/2016   Procedure: Left Heart Cath and Coronary Angiography;  Surgeon: [name], MD;  Location: [facility];  Service: Cardiovascular;  Laterality: N/A;  . CHOLECYSTECTOMY    . TUBAL LIGATION     Past Medical History:  Diagnosis Date  . CAD (coronary artery disease)    a. Lexiscan 05/12/16: smal defect of moderate severity in the mid anterior and apical location opartially reversible at rest. EF 42%. TID ratio uincreased at 1.23 possibly c/w balanced ischemia. Frequent PVCs  . Diabetes mellitus type 2 in obese ([place])   . Essential hypertension   . Facet syndrome, lumbar ([place])   . GERD (gastroesophageal reflux disease)   . Hyperlipidemia   . Hypothyroidism   . Low back pain   . Lumbago   . Lumbosacral spondylosis without myelopathy   . Lymphedema   . Lymphedema   . Morbid obesity ([place])   . Pain in limb   . Primary localized osteoarthrosis, lower leg    BP (!) 149/79   Pulse 98   SpO2 94% Comment: 2L [place]  Opioid Risk Score:  0 Fall Risk Score:  `1  Depression screen PHQ 2/9  Depression screen [facility] 2/9 06/14/2017 04/17/2017 01/24/2017 09/26/2016 04/20/2016 01/26/2016 11/24/2015  Decreased Interest 1 1 0 0 0 2 0  Down, Depressed,  Hopeless 1 1 0 3 0 1 1  PHQ - 2 Score 2 2 0 3 0 3 1  Altered sleeping - - - 0 - 3 -  Tired, decreased energy - - - 3 - 3 -  Change in appetite - - - 3 - 2 -  Feeling bad or failure about yourself  - - - 2 - 2 -  Trouble concentrating - - - 0 - 0 -  Moving slowly or fidgety/restless - - - 3 - 0 -  Suicidal thoughts - - - 0 - 0 -  PHQ-9 Score - - - 14 - 13 -  Difficult doing work/chores - - - Not difficult at all - Somewhat difficult -  Some recent data might be hidden    Review of Systems  HENT: Negative.   Eyes: Negative.   Respiratory: Negative.   Cardiovascular: Negative.   Gastrointestinal: Negative.   Endocrine: Negative.   Genitourinary: Negative.     Musculoskeletal: Positive for gait problem.  Skin: Negative.   Allergic/Immunologic: Negative.   Neurological: Positive for weakness and numbness.  Hematological: Negative.   Psychiatric/Behavioral: Positive for dysphoric mood.  All other systems reviewed and are negative.      Objective:   Physical Exam  Constitutional: She is oriented to person, place, and time. She appears well-developed and well-nourished.  HENT:  Head: Normocephalic and atraumatic.  Neck: Normal range of motion. Neck supple.  Cardiovascular: Normal rate and regular rhythm.   Pulmonary/Chest: Effort normal and breath sounds normal.  Musculoskeletal:  Normal Muscle Bulk and Muscle Testing Reveals: Upper Extremities: Full ROM and Muscle Strength 5/5 Right AC Joint Tenderness Lumbar Paraspinal Tenderness: L-4-L-5 Lower Extremities: Decreased ROM and Muscle Strength 5/5 Arises from Table Slowly using [place] Crutches Antalgic gait    Neurological: She is alert and oriented to person, place, and time.  Skin: Skin is warm and dry.  Psychiatric: She has a normal mood and affect.  Nursing note and vitals reviewed.         Assessment & Plan:  1. Lumbar pain chronic, no radiculopathy:Lumbar degenerative disc L4-5 and L5-S1. Continue Gabapentin: 06/14/2017 : Refilled: HYDRcodone 7.5/325mg  one tablet TID #90.  MS CONTIN 30 mg one every 12 hours #60.  We will continue the opioid monitoring program,this consists of regular clinic visits, examinations, urine drug screen, pill counts as well as use of [place] Controlled Substance reporting System. 2. Bilateral knee osteoarthritis: S/P Right Knee Injection with relief noted on 03/09/2017. Continue with Exercise and Heat therapy. Continue current medication regime. 06/14/2017 3. Morbid Obesity: Continue with Diabetic Diet Regime and Exercise Regimen. 06/14/2017 4. Muscle Spasms: Continue Baclofen. 06/14/2017  15 minutes of face to face patient care  time was spent during this visit. All questions were encouraged and answered.  F/U in 1 month

## 2017-07-09 ENCOUNTER — Other Ambulatory Visit: Payer: Self-pay | Admitting: Internal Medicine

## 2017-07-13 ENCOUNTER — Encounter: Payer: Self-pay | Admitting: Internal Medicine

## 2017-07-13 ENCOUNTER — Ambulatory Visit (INDEPENDENT_AMBULATORY_CARE_PROVIDER_SITE_OTHER): Payer: Medicare Other | Admitting: Internal Medicine

## 2017-07-13 VITALS — BP 142/78 | HR 100 | Ht 64.0 in | Wt 380.0 lb

## 2017-07-13 DIAGNOSIS — G4733 Obstructive sleep apnea (adult) (pediatric): Secondary | ICD-10-CM

## 2017-07-13 DIAGNOSIS — J9611 Chronic respiratory failure with hypoxia: Secondary | ICD-10-CM | POA: Diagnosis not present

## 2017-07-13 NOTE — Patient Instructions (Signed)
Continue CPAP therapy Continue oxygen therapy Continue inhaler therapy as prescribed

## 2017-07-13 NOTE — Progress Notes (Signed)
[facility] Consultation      Date: 07/13/2017,   MRN# [id number] Theresa Huffman 03-04-1946 Code Status:  Code Status History    Date Active Date Inactive Code Status Order ID Comments User Context   06/02/2016  9:49 AM 06/02/2016  3:20 PM Full Code [id number]  [name], MD Inpatient         AdmissionWeight: (!) 380 lb (172.4 kg)                 CurrentWeight: (!) 380 lb (172.4 kg) Theresa Huffman is a 71 y.o. old female seen in consultation for SOB at the request of Dr. Deborra Medina.     CHIEF COMPLAINT:   Follow SOB/follow up OSA   HISTORY OF PRESENT ILLNESS   71 yo morbidly obese white female seen today for follow up OSA   first OV  Has been going on for 8 months and has progressively worsened in the past 2 months Has worsened with exertion patient has associated intermittent wheezing with cough, also with associated headaches and dizziness. Upon further assessment, patient had dropped Oxygen sats to 84% with very mild exertion, patient immediately placed on oxygen    OV 12/17-She was not actively in distress, patient has no signs of infection at this time Patient is nonsmoker, but has extensive second hand smoking history for approx 26  Years Started BREO last visit and she does not have the resp capacity to inihale CT chest 04/2016 shows MILD LAD ECHO 04/2016 shows grade 1 diastolic dysfunction SLeep study confrims sleep apnea   OV 4/13 Doing really well with OSA therapy 100% compliance AHI 0.2 No leaks Patient claims to much pressure is blowing out RT ear No signs of infection at this time On chronic oxygen therapy  CURRENT OV Compliance report 97% days 97% >4 hrs per night AHI 0.2 No acute issues Pulmicort nebs helping her breathing No acute signs of infection at this time. No acute signs of cardiac or respiratory  decompensation Current on chornic o  Current Medication:  Current Outpatient Prescriptions:  .  amLODipine  (NORVASC) 5 MG tablet, Take 1 tablet (5 mg total) by mouth daily., Disp: 90 tablet, Rfl: 2 .  aspirin EC 81 MG tablet, Take 81 mg by mouth daily. , Disp: , Rfl:  .  baclofen (LIORESAL) 10 MG tablet, TAKE ONE (1) TABLET BY MOUTH THREE (3) TIMES EACH DAY, Disp: 90 each, Rfl: 2 .  budesonide (PULMICORT) 0.5 MG/2ML nebulizer solution, Take 2 mLs (0.5 mg total) by nebulization 2 (two) times daily., Disp: 120 mL, Rfl: 12 .  Calcium Carbonate-Vitamin D 600-400 MG-UNIT per tablet, Take 1 tablet by mouth daily. 1200mg  once daily, Disp: , Rfl:  .  furosemide (LASIX) 40 MG tablet, TAKE 1 TABLET BY MOUTH TWICE A DAY, Disp: 180 tablet, Rfl: 3 .  gabapentin (NEURONTIN) 300 MG capsule, TAKE TWO CAPSULES BY MOUTH THREE TIMES DAILY, Disp: 540 capsule, Rfl: 1 .  glipiZIDE (GLUCOTROL) 10 MG tablet, TAKE 1/2 TABLET BY MOUTH TWICE DAILY BEFORE A MEAL, Disp: 180 tablet, Rfl: 3 .  HYDROcodone-acetaminophen (NORCO) 7.5-325 MG tablet, Take 1 tablet by mouth 3 (three) times daily., Disp: 90 tablet, Rfl: 0 .  ibuprofen (ADVIL,MOTRIN) 800 MG tablet, TAKE ONE (1) TABLET BY MOUTH THREE (3) TIMES EACH DAY, Disp: 90 tablet, Rfl: 6 .  Insulin NPH, Human,, Isophane, (HUMULIN N) 100 UNIT/ML Kiwkpen, Inject units 20 units before breakfast and 15 units before bedtime (Patient taking differently: Inject  units 25 units before breakfast and 20 units before bedtime), Disp: 15 mL, Rfl: 2 .  metFORMIN (GLUCOPHAGE) 1000 MG tablet, TAKE 1 TABLET BY MOUTH TWICE A DAY WITH A MEAL, Disp: 60 tablet, Rfl: 2 .  morphine (MS CONTIN) 30 MG 12 hr tablet, Take 1 tablet (30 mg total) by mouth every 12 (twelve) hours., Disp: 60 tablet, Rfl: 0 .  potassium chloride SA (K-DUR,KLOR-CON) 20 MEQ tablet, Take 1 tablet (20 mEq total) by mouth daily., Disp: 90 tablet, Rfl: 2 .  PROAIR HFA 108 (90 Base) MCG/ACT inhaler, INHALE 2 PUFFS INTO THE LUNGS EVERY 4 HOURS AS NEEDED, Disp: 8.5 g, Rfl: 3 .  simvastatin (ZOCOR) 20 MG tablet, TAKE 1 TABLET BY MOUTH DAILY, Disp:  90 tablet, Rfl: 3 .  SYNTHROID 200 MCG tablet, TAKE ONE TABLET BY MOUTH EVERY MORNING BEFORE BREAKFAST, Disp: 30 tablet, Rfl: 3 .  venlafaxine (EFFEXOR) 75 MG tablet, TAKE THREE TABLETS BY MOUTH EVERY NIGHT AT BEDTIME, Disp: 90 tablet, Rfl: 3    ALLERGIES   Ace inhibitors; Erythromycin; Invokana [canagliflozin]; and Other     REVIEW OF SYSTEMS   Review of Systems  Constitutional: Negative for chills, diaphoresis, fever, malaise/fatigue and weight loss.  HENT: Negative for congestion and hearing loss.   Eyes: Negative for blurred vision and double vision.  Respiratory: Negative for cough, hemoptysis, sputum production, shortness of breath and wheezing.   Cardiovascular: Positive for leg swelling. Negative for chest pain, palpitations and orthopnea.  Gastrointestinal: Negative for abdominal pain, heartburn, nausea and vomiting.  Skin: Negative for rash.  Neurological: Negative for weakness.  All other systems reviewed and are negative.   Ht 5\' 4"  (1.626 m)   Wt (!) 380 lb (172.4 kg)   BMI 65.23 kg/m   Ht 5\' 4"  (1.626 m)   Wt (!) 380 lb (172.4 kg)   BMI 65.23 kg/m     PHYSICAL EXAM  Physical Exam  Constitutional: She is oriented to person, place, and time. She appears well-developed and well-nourished. No distress.  Eyes: No scleral icterus.  Neck: Neck supple.  Cardiovascular: Normal rate, regular rhythm and normal heart sounds.   No murmur heard. Pulmonary/Chest: Effort normal and breath sounds normal. No stridor. No respiratory distress. She has no wheezes.  Musculoskeletal: Normal range of motion. She exhibits edema.  Neurological: She is alert and oriented to person, place, and time. No cranial nerve deficit.  Skin: Skin is warm. She is not diaphoretic.  Psychiatric: She has a normal mood and affect.      ASSESSMENT/PLAN   71 yo morbidly obese white female with SOB is now with dx of chronic hypoxic resp failure-etiology is multifactorial including her morbid  obesity with underlying OSA/OHS with reactive airways disease probable COPD from second hand smoke exposure in the setting of Grade 1 Diastolic cardiac dysfunction  #1 Chronic shortness of breath and dyspnea on exertion related to underlying hypoxemia, morbid obesity and sleep apnea.  #2. Chronic hypoxic respiratory failure, continue oxygen as prescribed.  #3. Underlying sleep apnea. Continue current management and prescription of her CPAP continue CPAP therapy and change to  8 cm h20 for OSA therapy(current pressure is 11 cm h2o  #4. Probable underlying COPD from secondhand smoke exposure. Continue Pulmicort nebulizer therapy. Continue albuterol as needed  #5. Chronic grade 1 diastolic cardiac dysfunction. Continue Lasix as prescribed.  follow-up with cardiology    Follow up in 6 months Patient/Family are satisfied with Plan of action and management. All questions answered  Corrin Parker, M.D.  [facility]  Medical Director [facility] Director [facility] Cardio-Pulmonary Department

## 2017-07-17 ENCOUNTER — Encounter: Payer: Self-pay | Admitting: Registered Nurse

## 2017-07-17 ENCOUNTER — Encounter: Payer: Medicare Other | Attending: Physical Medicine & Rehabilitation | Admitting: Registered Nurse

## 2017-07-17 VITALS — BP 152/81 | HR 97

## 2017-07-17 DIAGNOSIS — M47817 Spondylosis without myelopathy or radiculopathy, lumbosacral region: Secondary | ICD-10-CM | POA: Diagnosis not present

## 2017-07-17 DIAGNOSIS — M7541 Impingement syndrome of right shoulder: Secondary | ICD-10-CM | POA: Insufficient documentation

## 2017-07-17 DIAGNOSIS — M25562 Pain in left knee: Secondary | ICD-10-CM | POA: Diagnosis not present

## 2017-07-17 DIAGNOSIS — M25511 Pain in right shoulder: Secondary | ICD-10-CM | POA: Diagnosis not present

## 2017-07-17 DIAGNOSIS — M75102 Unspecified rotator cuff tear or rupture of left shoulder, not specified as traumatic: Secondary | ICD-10-CM | POA: Diagnosis not present

## 2017-07-17 DIAGNOSIS — G894 Chronic pain syndrome: Secondary | ICD-10-CM | POA: Diagnosis not present

## 2017-07-17 DIAGNOSIS — M25561 Pain in right knee: Secondary | ICD-10-CM | POA: Diagnosis not present

## 2017-07-17 DIAGNOSIS — Z79899 Other long term (current) drug therapy: Secondary | ICD-10-CM

## 2017-07-17 DIAGNOSIS — G8929 Other chronic pain: Secondary | ICD-10-CM | POA: Diagnosis not present

## 2017-07-17 DIAGNOSIS — Z5181 Encounter for therapeutic drug level monitoring: Secondary | ICD-10-CM

## 2017-07-17 MED ORDER — MORPHINE SULFATE ER 30 MG PO TBCR: 30.0000 mg | EXTENDED_RELEASE_TABLET | Freq: Two times a day (BID) | ORAL | 0 refills | Status: DC

## 2017-07-17 MED ORDER — HYDROCODONE-ACETAMINOPHEN 7.5-325 MG PO TABS: 1.0000 | ORAL_TABLET | Freq: Three times a day (TID) | ORAL | 0 refills | Status: DC

## 2017-07-17 NOTE — Progress Notes (Signed)
Subjective:    Patient ID: Theresa Huffman, female    DOB: 29-Mar-1946, 71 y.o.   MRN: [id number]  HPI: [name] is a 71year old female who returns for follow up appointmentfor chronic pain and medication refill. She states her pain is located in her right shoulder,lower back and rightknee. She rates her pain 7. Her current exercise regime is walking short distances using loftstrand crutches in her home and performing chair exercises daily.  Husband inn room.  Last urine drug screen was performed on 02/09/2017, it was consistent.   Pain Inventory Average Pain 7 Pain Right Now 7 My pain is burning and aching  In the last 24 hours, has pain interfered with the following? General activity 8 Relation with others 8 Enjoyment of life 6 What TIME of day is your pain at its worst? varies Sleep (in general) NA  Pain is worse with: walking, bending, standing and some activites Pain improves with: rest and medication Relief from Meds: 6  Mobility walk with assistance use a cane ability to climb steps?  no do you drive?  yes use a wheelchair needs help with transfers  Function disabled: date disabled . I need assistance with the following:  meal prep, household duties and shopping  Neuro/Psych weakness numbness trouble walking depression  Prior Studies Any changes since last visit?  no  Physicians involved in your care Any changes since last visit?  no   Family History  Problem Relation Age of Onset  . Cancer Mother        lung and colon  . Stroke Sister    Social History   Social History  . Marital status: Married    Spouse name: N/A  . Number of children: 1  . Years of education: N/A   Occupational History  . Diability Unemployed   Social History Main Topics  . Smoking status: Never Smoker  . Smokeless tobacco: Never Used  . Alcohol use No  . Drug use: No  . Sexual activity: Not Asked   Other Topics Concern  . None   Social  History Narrative   Lives in [place], one adult child      Caffeine use: coffee daily in am   Past Surgical History:  Procedure Laterality Date  . CARDIAC CATHETERIZATION N/A 06/02/2016   Procedure: Left Heart Cath and Coronary Angiography;  Surgeon: [name], MD;  Location: [place];  Service: Cardiovascular;  Laterality: N/A;  . CHOLECYSTECTOMY    . TUBAL LIGATION     Past Medical History:  Diagnosis Date  . CAD (coronary artery disease)    a. Lexiscan 05/12/16: smal defect of moderate severity in the mid anterior and apical location opartially reversible at rest. EF 42%. TID ratio uincreased at 1.23 possibly c/w balanced ischemia. Frequent PVCs  . Diabetes mellitus type 2 in obese ([place])   . Essential hypertension   . Facet syndrome, lumbar ([place])   . GERD (gastroesophageal reflux disease)   . Hyperlipidemia   . Hypothyroidism   . Low back pain   . Lumbago   . Lumbosacral spondylosis without myelopathy   . Lymphedema   . Lymphedema   . Morbid obesity ([place])   . Pain in limb   . Primary localized osteoarthrosis, lower leg    BP (!) 152/81   Pulse 97   SpO2 94%   Opioid Risk Score:  0 Fall Risk Score:  `1  Depression screen PHQ 2/9  Depression screen  [facility] 2/9 07/17/2017 06/14/2017 04/17/2017 01/24/2017 09/26/2016 04/20/2016 01/26/2016  Decreased Interest 1 1 1  0 0 0 2  Down, Depressed, Hopeless 1 1 1  0 3 0 1  PHQ - 2 Score 2 2 2  0 3 0 3  Altered sleeping - - - - 0 - 3  Tired, decreased energy - - - - 3 - 3  Change in appetite - - - - 3 - 2  Feeling bad or failure about yourself  - - - - 2 - 2  Trouble concentrating - - - - 0 - 0  Moving slowly or fidgety/restless - - - - 3 - 0  Suicidal thoughts - - - - 0 - 0  PHQ-9 Score - - - - 14 - 13  Difficult doing work/chores - - - - Not difficult at all - Somewhat difficult  Some recent data might be hidden    Review of Systems  HENT: Negative.   Eyes: Negative.   Respiratory: Negative.     Cardiovascular: Negative.   Gastrointestinal: Negative.   Endocrine: Negative.   Genitourinary: Negative.   Musculoskeletal: Positive for gait problem.  Skin: Negative.   Allergic/Immunologic: Negative.   Neurological: Positive for weakness and numbness.  Hematological: Negative.   Psychiatric/Behavioral: Positive for dysphoric mood.  All other systems reviewed and are negative.      Objective:   Physical Exam  Constitutional: She is oriented to person, place, and time. She appears well-developed and well-nourished.  HENT:  Head: Normocephalic and atraumatic.  Neck: Normal range of motion. Neck supple.  Cardiovascular: Normal rate and regular rhythm.   Pulmonary/Chest: Effort normal and breath sounds normal.  Musculoskeletal:  Normal Muscle Bulk and Muscle Testing Reveals: Upper Extremities:Full  ROM and Muscle Strength 5/5 Right AC Joint Tenderness Thoracic Paraspinal Tenderness: T-1-T-3 Mainly Right Side Lumbar Paraspinal Tenderness: L-3-L-5 Lower Extremities: Decreased ROM and Muscle Strength 5/5 Arrived in Motorized Wheelchair    Neurological: She is alert and oriented to person, place, and time.  Skin: Skin is warm and dry.  Psychiatric: She has a normal mood and affect.  Nursing note and vitals reviewed.         Assessment & Plan:  1. Lumbar pain chronic, no radiculopathy:Lumbar degenerative disc L4-5 and L5-S1. Continue Gabapentin: 07/17/2017 : Refilled: HYDRcodone 7.5/325mg  one tablet TID #90.  MS CONTIN 30 mg one every 12 hours #60.  We will continue the opioid monitoring program,this consists of regular clinic visits, examinations, urine drug screen, pill counts as well as use of [place] Controlled Substance reporting System. 2. Bilateral knee osteoarthritis: S/P Right Knee Injection on 03/09/2017 with relief noted. Continue with Exercise and Heat therapy. Continue current medication regime. 07/17/2017 3. Morbid Obesity: Continue with Diabetic  Diet Regime and Exercise Regimen. 07/17/2017 4. Muscle Spasms: Continue Baclofen. 07/17/2017  20 minutes of face to face patient care time was spent during this visit. All questions were encouraged and answered.   F/U in 1 month

## 2017-07-18 ENCOUNTER — Telehealth: Payer: Self-pay | Admitting: Registered Nurse

## 2017-07-18 NOTE — Telephone Encounter (Signed)
On 07/18/2017 the [place] was reviewed no conflict was seen on the [place] with multiple prescribers. Theresa Huffman has a signed narcotic contract with our office. If there were any discrepancies this would have been reported toher physician.

## 2017-08-01 ENCOUNTER — Telehealth: Payer: Self-pay | Admitting: Internal Medicine

## 2017-08-01 NOTE — Telephone Encounter (Signed)
[employer] calling regarding paperwork faxed for PT

## 2017-08-01 NOTE — Telephone Encounter (Signed)
Rx pending physician signature, once signed we will fax.

## 2017-08-02 NOTE — Telephone Encounter (Signed)
Called [employer] rep and let them know this as well.

## 2017-08-04 ENCOUNTER — Ambulatory Visit (INDEPENDENT_AMBULATORY_CARE_PROVIDER_SITE_OTHER): Payer: Medicare Other | Admitting: Internal Medicine

## 2017-08-04 ENCOUNTER — Encounter: Payer: Self-pay | Admitting: Internal Medicine

## 2017-08-04 VITALS — BP 128/82 | HR 106

## 2017-08-04 DIAGNOSIS — Z23 Encounter for immunization: Secondary | ICD-10-CM | POA: Diagnosis not present

## 2017-08-04 DIAGNOSIS — IMO0002 Reserved for concepts with insufficient information to code with codable children: Secondary | ICD-10-CM

## 2017-08-04 DIAGNOSIS — E1165 Type 2 diabetes mellitus with hyperglycemia: Secondary | ICD-10-CM

## 2017-08-04 DIAGNOSIS — E039 Hypothyroidism, unspecified: Secondary | ICD-10-CM

## 2017-08-04 DIAGNOSIS — E1149 Type 2 diabetes mellitus with other diabetic neurological complication: Secondary | ICD-10-CM | POA: Diagnosis not present

## 2017-08-04 LAB — POCT GLYCOSYLATED HEMOGLOBIN (HGB A1C): HEMOGLOBIN A1C: 6.5

## 2017-08-04 NOTE — Addendum Note (Signed)
Addended by: Caprice Beaver T on: 08/04/2017 04:53 PM   Modules accepted: Orders

## 2017-08-04 NOTE — Progress Notes (Signed)
Patient ID: Theresa Huffman, female   DOB: Jan 07, 1946, 71 y.o.   MRN: [id number]  HPI: Theresa Huffman is a 71 y.o.-year-old female, returning for f/u for DM2, dx 2010, insulin-dependent, uncontrolled, with complications (Peripheral neuropathy, mild CKD) and hypothyroidism. Last visit 5 mo ago.   DM2: Last hemoglobin A1c was: Lab Results  Component Value Date   HGBA1C 6.9 (H) 01/24/2017   HGBA1C 6.0 11/29/2016   HGBA1C 6.5 08/30/2016  Prev. HbA1C was 8.5% in 04/2011. She believes her HbA1c increased due to getting back to eating sweats.   Pt is on a regimen of: - Metformin 1000 mg 2x a day - NPH: - 25 units in am  - 20 units at bedtime - Glipizide 10 mg before b'fast and dinner (5 mg for a period of time but increased since) She was on Invokana 300 mg -  she had run out of it >> restarted >> developed a yeast inf >> stopped it 08/26/2014 We started Cycloset - 1.6 mg >> ran out of it and did not refill We had to stop Januvia 100 - expensive. She is in the doughnut hole by the end of the year.   Pt checks her sugars 1x a day: - am:  96, 94-143, 165, 227 >> 95-140 >> 80-166, 217 >> 88-133, 174 - before lunch: 208 >> 186 >> n/c - before dinner:  103-122 >> 104 >> 130 >> 79-136 >> 114 - 2h after dinner: 225 >> 139-189 >> n/c - bedtime: 85, 104-158 >> 74, 89-160, 202 >> 77-208, 265 >> 109-174, 215 - nighttime: 153 >> n/c >> 68, 104, 108 >> 67-137 Lowest sugar was 68 >> 67; she has hypoglycemia awareness at 80s.  Highest sugar was 265 >> 216  - + mild CKD, last BUN/creatinine:  Lab Results  Component Value Date   BUN 17 01/24/2017   CREATININE 0.81 01/24/2017  Has h/o angioedema - ACEI stopped - last set of lipids: Lab Results  Component Value Date   CHOL 175 01/24/2017   HDL 69.40 01/24/2017   LDLCALC 81 01/24/2017   LDLDIRECT 92.5 08/25/2014   TRIG 122.0 01/24/2017   CHOLHDL 3 01/24/2017  She is on Zocor. She is on ASA 81. - last eye exam was in 12/2016 >> No DR -  she has numbness and tingling in her feet - improved   Pt also has a history of HTN, Hypothyroidism, vit D def., HL, morbid obesity, lymphedema, vv insuff., GERD, lumbago, chronic bilateral knee pain.  She was considering GBP, but afraid of poss. SEs.  Hypothyroidism: - dx 2001  Pt is on Synthroid 200 mcg daily, taken: - in am - fasting - at least 30 min from b'fast - no Fe, MVI, PPIs - + calcium at night - not on Biotin  - reviewed TFTs: Lab Results  Component Value Date   TSH 0.75 01/24/2017   TSH 2.32 04/13/2016   TSH 4.27 12/15/2015   TSH 3.57 04/23/2015   TSH 5.83 (H) 03/10/2015   FREET4 1.20 12/15/2015   FREET4 1.19 04/23/2015   FREET4 1.09 03/10/2015   FREET4 1.37 02/13/2012   ROS: Constitutional: no weight gain/no weight loss, + fatigue, no subjective hyperthermia, no subjective hypothermia Eyes: no blurry vision, no xerophthalmia ENT: no sore throat, no nodules palpated in throat, no dysphagia, no odynophagia, no hoarseness Cardiovascular: no CP/no SOB/no palpitations/no leg swelling Respiratory: no cough/no SOB/no wheezing Gastrointestinal: no N/no V/no D/no C/no acid reflux Musculoskeletal: no muscle aches/no joint aches Skin:  no rashes, no hair loss Neurological: no tremors/no numbness/no tingling/no dizziness  I reviewed pt's medications, allergies, PMH, social hx, family hx, and changes were documented in the history of present illness. Otherwise, unchanged from my initial visit note.  PE: BP 128/82 (BP Location: Left Arm, Patient Position: Sitting)   Pulse (!) 106   SpO2 95% Comment: 2L There is no height or weight on file to calculate BMI.  Wt Readings from Last 3 Encounters:  07/13/17 (!) 380 lb (172.4 kg)  02/24/17 (!) 394 lb (178.7 kg)  01/30/17 (!) 394 lb (178.7 kg)   Constitutional: obese, in NAD, on O2 Eyes: PERRLA, EOMI, no exophthalmos ENT: moist mucous membranes, no thyromegaly, no cervical lymphadenopathy Cardiovascular: RRR at the end  of the appt, No MRG, + lymphedema B Respiratory: CTA B Gastrointestinal: abdomen soft, NT, ND, BS+ Musculoskeletal: no deformities, strength intact in all 4 Skin: moist, warm, no rashes, + peau d'orange on legs - nonerythematous Neurological: no tremor with outstretched hands, DTR normal in all 4  ASSESSMENT: 1. DM2, insulin-dependent, uncontrolled, with complications - PN, on Neurontin  2. Hypothyroidism - On d.a.w. Synthroid    PLAN:  1. Patient with long-standing, fairly well controlled diabetes, improved since last visit. She has had some lows during the night since last visit and I advised her to decrease her glipizide to 5 mg before dinner. She did so, however, sugars started to increase and she had to go up on the dose again. She is currently taking 10 mg twice a day. Upon questioning, she is not taking the glipizide before meals, but at 9 AM and 9 PM. She is eating the first meal of the day at 12 PM and is eating dinner earlier than 9 PM. I strongly advised her to move the glipizide 15-30 minutes before the first and last meals of the day but also to decrease the dose to 5 mg twice a day to avoid low blood sugars. She has had some 60s during the night in the last month. - I advised her to:  Patient Instructions  Please continue: - Metformin 1000 mg with meals - NPH 25 units in am and 20 units at bedtime  Please decrease: - Glipizide to 5 mg before first and last meal of the day (you can use 10 mg for a particularly large meal of if you have dessert)  Please return in 3-4 months with your sugar log.   - today, HbA1c is 6.5% (better!) - continue checking sugars at different times of the day - check 1x a day, rotating checks - advised for yearly eye exams >> she is UTD - We'll give her the flu shot today.  - Return to clinic in 3-4 mo with sugar log   2. Hypothyroidism - latest thyroid labs reviewed with pt >> normal  - she continues on LT4 200 mcg daily - pt feels good on  this dose. - we discussed about taking the thyroid hormone every day, with water, >30 minutes before breakfast, separated by >4 hours from acid reflux medications, calcium, iron, multivitamins. Pt. is taking it correctly - will check thyroid tests at next visit   Philemon Kingdom, MD PhD [facility]

## 2017-08-04 NOTE — Patient Instructions (Addendum)
Please continue: - Metformin 1000 mg with meals - NPH 25 units in am and 20 units at bedtime  Please decrease: - Glipizide to 5 mg before first and last meal of the day (you can use 10 mg for a particularly large meal of if you have dessert)  Please return in 3-4 months with your sugar log.

## 2017-08-11 ENCOUNTER — Encounter: Payer: Self-pay | Admitting: Registered Nurse

## 2017-08-11 ENCOUNTER — Encounter: Payer: Medicare Other | Attending: Physical Medicine & Rehabilitation | Admitting: Registered Nurse

## 2017-08-11 VITALS — BP 145/77 | HR 91

## 2017-08-11 DIAGNOSIS — Z79899 Other long term (current) drug therapy: Secondary | ICD-10-CM | POA: Insufficient documentation

## 2017-08-11 DIAGNOSIS — G8929 Other chronic pain: Secondary | ICD-10-CM

## 2017-08-11 DIAGNOSIS — M25562 Pain in left knee: Secondary | ICD-10-CM | POA: Insufficient documentation

## 2017-08-11 DIAGNOSIS — M47817 Spondylosis without myelopathy or radiculopathy, lumbosacral region: Secondary | ICD-10-CM

## 2017-08-11 DIAGNOSIS — Z5181 Encounter for therapeutic drug level monitoring: Secondary | ICD-10-CM | POA: Insufficient documentation

## 2017-08-11 DIAGNOSIS — M25512 Pain in left shoulder: Secondary | ICD-10-CM | POA: Diagnosis not present

## 2017-08-11 DIAGNOSIS — M25561 Pain in right knee: Secondary | ICD-10-CM | POA: Diagnosis not present

## 2017-08-11 DIAGNOSIS — M25511 Pain in right shoulder: Secondary | ICD-10-CM

## 2017-08-11 DIAGNOSIS — G894 Chronic pain syndrome: Secondary | ICD-10-CM | POA: Diagnosis not present

## 2017-08-11 DIAGNOSIS — M75102 Unspecified rotator cuff tear or rupture of left shoulder, not specified as traumatic: Secondary | ICD-10-CM | POA: Diagnosis not present

## 2017-08-11 DIAGNOSIS — M7541 Impingement syndrome of right shoulder: Secondary | ICD-10-CM | POA: Insufficient documentation

## 2017-08-11 MED ORDER — MORPHINE SULFATE ER 30 MG PO TBCR: 30.0000 mg | EXTENDED_RELEASE_TABLET | Freq: Two times a day (BID) | ORAL | 0 refills | Status: DC

## 2017-08-11 MED ORDER — HYDROCODONE-ACETAMINOPHEN 7.5-325 MG PO TABS: 1.0000 | ORAL_TABLET | Freq: Three times a day (TID) | ORAL | 0 refills | Status: DC

## 2017-08-11 NOTE — Progress Notes (Signed)
Subjective:    Patient ID: Theresa Huffman, female    DOB: Nov 04, 1945, 71 y.o.   MRN: [id number]  HPI: Theresa Huffman is a 71year old female who returns for follow up appointmentfor chronic pain and medication refill. She states her pain is located in her bilateral  shoulders,lower back and bilateral knee pain. She rates her pain 7. Her current exercise regime is walking short distances using loftstrand crutches in her home and performing chair exercises daily.  Theresa Huffman equivalent is 82.50 MME  Husband in room.  Last urine drug screen was performed on 02/09/2017, it was consistent.   Pain Inventory Average Pain 7 Pain Right Now 7 My pain is burning and aching  In the last 24 hours, has pain interfered with the following? General activity 7 Relation with others 7 Enjoyment of life 7 What TIME of day is your pain at its worst? varies daytime and evening Sleep (in general) Fair  Pain is worse with: walking, bending and standing Pain improves with: rest and medication Relief from Meds: 6  Mobility ability to climb steps?  no do you drive?  yes use a wheelchair needs help with transfers  Function disabled: date disabled . I need assistance with the following:  meal prep, household duties and shopping  Neuro/Psych weakness numbness trouble walking depression  Prior Studies Any changes since last visit?  no  Physicians involved in your care Any changes since last visit?  no   Family History  Problem Relation Age of Onset  . Cancer Mother        lung and colon  . Stroke Sister    Social History   Social History  . Marital status: Married    Spouse name: N/A  . Number of children: 1  . Years of education: N/A   Occupational History  . Diability Unemployed   Social History Main Topics  . Smoking status: Never Smoker  . Smokeless tobacco: Never Used  . Alcohol use No  . Drug use: No  . Sexual activity: Not on file   Other  Topics Concern  . Not on file   Social History Narrative   Lives in [place], one adult child      Caffeine use: coffee daily in am   Past Surgical History:  Procedure Laterality Date  . CARDIAC CATHETERIZATION N/A 06/02/2016   Procedure: Left Heart Cath and Coronary Angiography;  Surgeon: [name], MD;  Location: [facility];  Service: Cardiovascular;  Laterality: N/A;  . CHOLECYSTECTOMY    . TUBAL LIGATION     Past Medical History:  Diagnosis Date  . CAD (coronary artery disease)    a. Lexiscan 05/12/16: smal defect of moderate severity in the mid anterior and apical location opartially reversible at rest. EF 42%. TID ratio uincreased at 1.23 possibly c/w balanced ischemia. Frequent PVCs  . Diabetes mellitus type 2 in obese ([place])   . Essential hypertension   . Facet syndrome, lumbar   . GERD (gastroesophageal reflux disease)   . Hyperlipidemia   . Hypothyroidism   . Low back pain   . Lumbago   . Lumbosacral spondylosis without myelopathy   . Lymphedema   . Lymphedema   . Morbid obesity ([place])   . Pain in limb   . Primary localized osteoarthrosis, lower leg    There were no vitals taken for this visit.  Opioid Risk Score:  0 Fall Risk Score:  `1  Depression screen PHQ  2/9  Depression screen PHQ 2/9 08/11/2017 07/17/2017 06/14/2017 04/17/2017 01/24/2017 09/26/2016 04/20/2016  Decreased Interest 1 1 1 1  0 0 0  Down, Depressed, Hopeless 1 1 1 1  0 3 0  PHQ - 2 Score 2 2 2 2  0 3 0  Altered sleeping - - - - - 0 -  Tired, decreased energy - - - - - 3 -  Change in appetite - - - - - 3 -  Feeling bad or failure about yourself  - - - - - 2 -  Trouble concentrating - - - - - 0 -  Moving slowly or fidgety/restless - - - - - 3 -  Suicidal thoughts - - - - - 0 -  PHQ-9 Score - - - - - 14 -  Difficult doing work/chores - - - - - Not difficult at all -  Some recent data might be hidden    Review of Systems  HENT: Negative.   Eyes: Negative.   Respiratory:  Negative.   Cardiovascular: Negative.   Gastrointestinal: Negative.   Endocrine: Negative.   Genitourinary: Negative.   Musculoskeletal: Positive for gait problem.  Skin: Negative.   Allergic/Immunologic: Negative.   Neurological: Positive for weakness and numbness.  Hematological: Negative.   Psychiatric/Behavioral: Positive for dysphoric mood.  All other systems reviewed and are negative.      Objective:   Physical Exam  Constitutional: She is oriented to person, place, and time. She appears well-developed and well-nourished.  HENT:  Head: Normocephalic and atraumatic.  Neck: Normal range of motion. Neck supple.  Cardiovascular: Normal rate and regular rhythm.   Pulmonary/Chest: Effort normal and breath sounds normal.  Continuous Oxygen @ 2 liters nasal cannula  Musculoskeletal:  Normal Muscle Bulk and Muscle Testing Reveals: Upper Extremities:Full  ROM and Muscle Strength 5/5 Bilateral AC Joint Tenderness Thoracic Paraspinal Tenderness: T-7-T-9 Lumbar Paraspinal Tenderness: L-3-L-5 Lower Extremities: Full  ROM and Muscle Strength 5/5 Arrived in Motorized Wheelchair    Neurological: She is alert and oriented to person, place, and time.  Skin: Skin is warm and dry.  Psychiatric: She has a normal mood and affect.  Nursing note and vitals reviewed.         Assessment & Plan:  1. Lumbar pain chronic, no radiculopathy:Lumbar degenerative disc L4-5 and L5-S1. Continue Gabapentin: 08/11/2017 : Refilled: HYDRcodone 7.5/325mg  one tablet TID #90.  MS CONTIN 30 mg one every 12 hours #60.  We will continue the opioid monitoring program,this consists of regular clinic visits, examinations, urine drug screen, pill counts as well as use of [place] Controlled Substance reporting System. 2. Bilateral knee osteoarthritis: S/P Right Knee Injection on 03/09/2017 with relief noted. Continue with Exercise and Heat therapy. Continue current medication regime. 08/11/2017 3.  Morbid Obesity: Continue with Diabetic Diet Regime and Exercise Regimen. 08/11/2017 4. Muscle Spasms: Continue Baclofen. 08/11/2017  20 minutes of face to face patient care time was spent during this visit. All questions were encouraged and answered.  F/U in 1 month

## 2017-08-20 ENCOUNTER — Other Ambulatory Visit: Payer: Self-pay | Admitting: Internal Medicine

## 2017-08-21 ENCOUNTER — Encounter: Payer: Self-pay | Admitting: Physician Assistant

## 2017-08-21 ENCOUNTER — Ambulatory Visit (INDEPENDENT_AMBULATORY_CARE_PROVIDER_SITE_OTHER): Payer: Medicare Other | Admitting: Physician Assistant

## 2017-08-21 ENCOUNTER — Ambulatory Visit (INDEPENDENT_AMBULATORY_CARE_PROVIDER_SITE_OTHER): Payer: Medicare Other

## 2017-08-21 VITALS — BP 140/70 | HR 93 | Ht 64.0 in | Wt 380.0 lb

## 2017-08-21 DIAGNOSIS — I890 Lymphedema, not elsewhere classified: Secondary | ICD-10-CM | POA: Diagnosis not present

## 2017-08-21 DIAGNOSIS — I10 Essential (primary) hypertension: Secondary | ICD-10-CM

## 2017-08-21 DIAGNOSIS — I493 Ventricular premature depolarization: Secondary | ICD-10-CM

## 2017-08-21 DIAGNOSIS — R5383 Other fatigue: Secondary | ICD-10-CM

## 2017-08-21 DIAGNOSIS — R002 Palpitations: Secondary | ICD-10-CM

## 2017-08-21 DIAGNOSIS — I2510 Atherosclerotic heart disease of native coronary artery without angina pectoris: Secondary | ICD-10-CM

## 2017-08-21 DIAGNOSIS — J9611 Chronic respiratory failure with hypoxia: Secondary | ICD-10-CM

## 2017-08-21 MED ORDER — ISOSORBIDE MONONITRATE ER 30 MG PO TB24: 30.0000 mg | ORAL_TABLET | Freq: Every day | ORAL | 3 refills | Status: AC

## 2017-08-21 NOTE — Progress Notes (Signed)
Cardiology Office Note Date:  08/21/2017  Patient ID:  Theresa Huffman, Theresa Huffman January 19, 1946, MRN [id number] PCP:  Lucille Passy, MD  Cardiologist:  Formerly Dr. Yvone Neu    Chief Complaint: 15-month follow up, notes chest discomfort and palpitations   History of Present Illness: Theresa Huffman is a 71 y.o. female with history of nonobstructive CAD by [facility] 05/2016 as detailed below, frequent PVCs, diastolic dysfunction, chronic hypoxic respiratory failure that is multifactorial including morbid obesity, OHS/OSA with reactive airway disease and probable COPD from second hand smoke, diastolic dysfunction, angioedema associated with ACEi, DM2, HTN, HLD, hypothyroidism, OSA on CPAP, chronic low back pain, GERD, and lymphedema who presents for follow up of CAD and palpitations.  Previously followed by Dr, Yvone Neu, MD for evaluation of SOB. Because of her risk factors, she underwent CTA PE protocol in 04/2016 that was negative for PE. This was followed by Lexiscan Myoview in 04/2016 that showed a small defect of moderate severity in the mid anterior and apical anterior location that was partially reversible at rest. Unable to rule out ischemia. TID ratio was increased at 1.23, possible consistent with balanced ischemia. EF 42%. Frequent PVCs were noted. TTE 04/2016 showed an EF of 55-60%, normal wall motion, Gr1DD, normal RV systolic function, PASP normal. Because of the abnormal stress test she underwent LHC on 06/02/2016 that showed near normal coronary arteries with only 20% proximal RCA stenosis. Normal LV systolic function with an EF of 55% and moderately elevated systemtic pressure and moderately elevated LVEDP. Her stress test was a false positive and continued treatment of risk factors was advised. She was last seen on 06/08/2016, and denied and cheast pain or SOB. It was felt her SOB was non-cardiac and follow up with PCP was advised. She was seen by pulmonary who ordered PFTs that showed and sleep study that  was positive for OSA.   She comes in today and is overall doing well. She does note intermittent chest discomfort occurring about every 4-5 days and lasting 2-3 minutes before self resolving. Discomfort initially began in July or August, 2018. Not associated with exertion. No associated symptoms. Feels like reflux. She has also noted intermittent palpitations occurring every 1-2 days, lasting 5-10 minutes before self resolving. Feels "fatigued" after palpitations. No associated symptoms. Never worn outpatient cardiac monitoring before. BP controlled at home. SOB stable and well controlled on 2 L oxygen via nasal cannula. Watches salt intake.    Past Medical History:  Diagnosis Date  . Chronic respiratory failure with hypoxia ([facility])    a. followed by pulmonary  . Coronary artery disease, non-occlusive    a. Lexiscan 05/12/16: smal defect of moderate severity in the mid anterior and apical location partially reversible at rest. EF 42%. TID ratio uincreased at 1.23 possibly c/w balanced ischemia. Frequent PVCs; b. LHC 05/2016: near nl with the exception of pRCA 20%, EF 55%  . Diabetes mellitus type 2 in obese ([place])   . Diastolic dysfunction    a. TTE 04/2016: EF 55-60%, nl WM, Gr1DD, nl RV sys fxn, PASP nl  . Essential hypertension   . Facet syndrome, lumbar   . GERD (gastroesophageal reflux disease)   . Hyperlipidemia   . Hypothyroidism   . Lumbosacral spondylosis without myelopathy   . Lymphedema   . Morbid obesity ([place])   . Primary localized osteoarthrosis, lower leg   . Sleep apnea     Past Surgical History:  Procedure Laterality Date  . CARDIAC CATHETERIZATION N/A 06/02/2016  Procedure: Left Heart Cath and Coronary Angiography;  Surgeon: [name], MD;  Location: [facility];  Service: Cardiovascular;  Laterality: N/A;  . CHOLECYSTECTOMY    . TUBAL LIGATION      Current Meds  Medication Sig  . amLODipine (NORVASC) 5 MG tablet Take 1 tablet (5 mg total) by mouth daily.   Marland Kitchen aspirin EC 81 MG tablet Take 81 mg by mouth daily.   . baclofen (LIORESAL) 10 MG tablet TAKE ONE (1) TABLET BY MOUTH THREE (3) TIMES EACH DAY  . budesonide (PULMICORT) 0.5 MG/2ML nebulizer solution Take 2 mLs (0.5 mg total) by nebulization 2 (two) times daily.  . Calcium Carbonate-Vitamin D 600-400 MG-UNIT per tablet Take 1 tablet by mouth daily. 1200mg  once daily  . furosemide (LASIX) 40 MG tablet TAKE 1 TABLET BY MOUTH TWICE A DAY  . gabapentin (NEURONTIN) 300 MG capsule TAKE TWO CAPSULES BY MOUTH THREE TIMES DAILY  . glipiZIDE (GLUCOTROL) 10 MG tablet TAKE 1/2 TABLET BY MOUTH TWICE DAILY BEFORE A MEAL  . HYDROcodone-acetaminophen (NORCO) 7.5-325 MG tablet Take 1 tablet by mouth 3 (three) times daily.  Marland Kitchen ibuprofen (ADVIL,MOTRIN) 800 MG tablet TAKE ONE (1) TABLET BY MOUTH THREE (3) TIMES EACH DAY  . Insulin NPH, Human,, Isophane, (HUMULIN N) 100 UNIT/ML Kiwkpen Inject units 20 units before breakfast and 15 units before bedtime (Patient taking differently: Inject units 25 units before breakfast and 20 units before bedtime)  . metFORMIN (GLUCOPHAGE) 1000 MG tablet TAKE 1 TABLET BY MOUTH TWICE A DAY WITH A MEAL.  Marland Kitchen morphine (MS CONTIN) 30 MG 12 hr tablet Take 1 tablet (30 mg total) by mouth every 12 (twelve) hours.  . potassium chloride SA (K-DUR,KLOR-CON) 20 MEQ tablet Take 1 tablet (20 mEq total) by mouth daily.  Marland Kitchen PROAIR HFA 108 (90 Base) MCG/ACT inhaler INHALE 2 PUFFS INTO THE LUNGS EVERY 4 HOURS AS NEEDED  . simvastatin (ZOCOR) 20 MG tablet TAKE 1 TABLET BY MOUTH DAILY  . SYNTHROID 200 MCG tablet TAKE ONE TABLET BY MOUTH EVERY MORNING BEFORE BREAKFAST  . venlafaxine (EFFEXOR) 75 MG tablet TAKE THREE TABLETS BY MOUTH EVERY NIGHT AT BEDTIME    Allergies:   Ace inhibitors; Erythromycin; Invokana [canagliflozin]; and Other   Social History:  The patient  reports that she has never smoked. She has never used smokeless tobacco. She reports that she does not drink alcohol or use drugs.    Family History:  The patient's family history includes Cancer in her mother; Stroke in her sister.  ROS:   Review of Systems  Constitutional: Positive for malaise/fatigue. Negative for chills, diaphoresis, fever and weight loss.  HENT: Negative for congestion.   Eyes: Negative for discharge and redness.  Respiratory: Positive for shortness of breath. Negative for cough, hemoptysis, sputum production and wheezing.        Stable dyspnea   Cardiovascular: Positive for chest pain, palpitations and leg swelling. Negative for orthopnea, claudication and PND.       Chest pain and palpitations do not occur simultaneously   Gastrointestinal: Positive for heartburn. Negative for abdominal pain, blood in stool, melena, nausea and vomiting.  Genitourinary: Negative for hematuria.  Musculoskeletal: Negative for falls and myalgias.  Skin: Negative for rash.  Neurological: Negative for dizziness, tingling, tremors, sensory change, speech change, focal weakness, loss of consciousness and weakness.  Endo/Heme/Allergies: Does not bruise/bleed easily.  Psychiatric/Behavioral: Negative for substance abuse. The patient is not nervous/anxious.   All other systems reviewed and are negative.  PHYSICAL EXAM:  VS:  BP 140/70 (BP Location: Right Arm, Patient Position: Sitting, Cuff Size: Large)   Pulse 93   Ht 5\' 4"  (1.626 m)   Wt (!) 380 lb (172.4 kg)   SpO2 97%   BMI 65.23 kg/m  BMI: Body mass index is 65.23 kg/m.  Physical Exam  Constitutional: She is oriented to person, place, and time. She appears well-developed and well-nourished.  HENT:  Head: Normocephalic and atraumatic.  Eyes: Right eye exhibits no discharge. Left eye exhibits no discharge.  Neck: Normal range of motion. No JVD present.  Cardiovascular: Normal rate, regular rhythm, S1 normal, S2 normal and normal heart sounds.  Exam reveals no distant heart sounds, no friction rub, no midsystolic click and no opening snap.   No murmur  heard. Pulmonary/Chest: Effort normal and breath sounds normal. No respiratory distress. She has no decreased breath sounds. She has no wheezes. She has no rales. She exhibits no tenderness.  Abdominal: Soft. She exhibits no distension. There is no tenderness.  Musculoskeletal: She exhibits edema.  Trace to 1+ dependent bilateral ankle edema  Neurological: She is alert and oriented to person, place, and time.  Skin: Skin is warm and dry. No cyanosis. Nails show no clubbing.  Psychiatric: She has a normal mood and affect. Her speech is normal and behavior is normal. Judgment and thought content normal.     EKG:  Was ordered and interpreted by me today. Shows NSR, 93 bpm, rare PVC, LVH with early repolarization, low voltage precordial leads, no acute st/t changes   Recent Labs: 01/24/2017: ALT 24; BUN 17; Creatinine, Ser 0.81; Hemoglobin 12.0; Platelets 324.0; Potassium 4.2; Sodium 140; TSH 0.75  01/24/2017: Cholesterol 175; HDL 69.40; LDL Cholesterol 81; Total CHOL/HDL Ratio 3; Triglycerides 122.0; VLDL 24.4   CrCl cannot be calculated (Patient's most recent lab result is older than the maximum 21 days allowed.).   Wt Readings from Last 3 Encounters:  08/21/17 (!) 380 lb (172.4 kg)  07/13/17 (!) 380 lb (172.4 kg)  02/24/17 (!) 394 lb (178.7 kg)     Other studies reviewed: Additional studies/records reviewed today include: summarized above  ASSESSMENT AND PLAN:  1. Nonobstructive CAD: Notes intermittent chest discomfort at rest about every 4-5 days that will last ~ 2-3 minutes and self resolve. No associated symptoms. Add Imdur 30 mg daily. Recent nonobstructive LHC as above. Optimize medical therapy. No plans for non-invasive or invasive ischemic evaluation at this time.   2. Diastolic dysfunction: She does not appear volume overloaded at this time. Continue Lasix. Consider addition of spironolactone at follow up. CHF education.   3. Frequent PVCs: Notes palpitations every 1-2 days,  lasting 5-10 minutes with self resolution. No associated symptoms. Occur at rest. Feels "fatigued" after episode of palpitations. No prior outpatient cardiac monitoring. Normal thyroid function dating back to at least 2016. Potassium at goal 12/2016. Check magnesium, bmet, and cbc. Schedule 7-day Zio monitor. Recent nonobstructive LHC as above, no plans for repeat at this time. Would continue to avoid beta blockers given reactive airway disease and respiratory failure. If noted to have increased ectopy, consider Cardizem in place of amlodipine (has tolerated 5 mg without many issues from a LE swelling standpoint).   4. Chronic respiratory failure with hypoxia: Stable. Followed by pulmonology. Felt to be multifactorial including reactive airway disease with probable COPD from 2nd hand smoke exposure, morbid obesity with PSA/OHS and diastolic dysfunction.   5. HTN: Blood pressure reasonably well controlled today. Add Imdur as above.  Continue amlodipine.   6. HLD: Simvastatin. Followed by PCP. Consider changing to high-intensity statin.   7. Morbid obesity: Weight loss advised. May benefit from pulmonary rehab.   8. Lymphedema: Related to body habitus. Elevate legs when able. Uncertain if she would tolerate compression stockings.   Disposition: F/u with me in 4 weeks.   Current medicines are reviewed at length with the patient today.  The patient did not have any concerns regarding medicines.  Melvern Banker PA-C 08/21/2017 2:10 PM     [address] [place], [place] [address] [phone]

## 2017-08-21 NOTE — Patient Instructions (Signed)
Medication Instructions:  Your physician has recommended you make the following change in your medication:  START taking imdur 30mg  once daily   Labwork: BMET, CBC, magnesium   Testing/Procedures: Zio monitor  Follow-Up: Your physician recommends that you schedule a follow-up appointment in: 1 month with Christell Faith, PA-C   Any Other Special Instructions Will Be Listed Below (If Applicable).     If you need a refill on your cardiac medications before your next appointment, please call your pharmacy.

## 2017-08-22 ENCOUNTER — Other Ambulatory Visit: Payer: Self-pay

## 2017-08-22 LAB — BASIC METABOLIC PANEL
BUN/Creatinine Ratio: 18 (ref 12–28)
BUN: 14 mg/dL (ref 8–27)
CALCIUM: 9.7 mg/dL (ref 8.7–10.3)
CHLORIDE: 98 mmol/L (ref 96–106)
CO2: 27 mmol/L (ref 20–29)
CREATININE: 0.78 mg/dL (ref 0.57–1.00)
GFR, EST AFRICAN AMERICAN: 90 mL/min/{1.73_m2} (ref >59)
GFR, EST NON AFRICAN AMERICAN: 78 mL/min/{1.73_m2} (ref >59)
Glucose: 225 mg/dL — ABNORMAL HIGH (ref 65–99)
Potassium: 4.1 mmol/L (ref 3.5–5.2)
Sodium: 145 mmol/L — ABNORMAL HIGH (ref 134–144)

## 2017-08-22 LAB — MAGNESIUM: Magnesium: 1.6 mg/dL (ref 1.6–2.3)

## 2017-08-22 LAB — CBC
HEMATOCRIT: 37.8 % (ref 34.0–46.6)
HEMOGLOBIN: 12 g/dL (ref 11.1–15.9)
MCH: 27.5 pg (ref 26.6–33.0)
MCHC: 31.7 g/dL (ref 31.5–35.7)
MCV: 87 fL (ref 79–97)
Platelets: 281 10*3/uL (ref 150–379)
RBC: 4.37 x10E6/uL (ref 3.77–5.28)
RDW: 14.5 % (ref 12.3–15.4)
WBC: 9.4 10*3/uL (ref 3.4–10.8)

## 2017-08-22 MED ORDER — MAGNESIUM OXIDE -MG SUPPLEMENT 400 (240 MG) MG PO TABS: 1.0000 | ORAL_TABLET | Freq: Every day | ORAL | 0 refills | Status: DC

## 2017-08-23 ENCOUNTER — Telehealth: Payer: Self-pay | Admitting: Physician Assistant

## 2017-08-23 NOTE — Telephone Encounter (Signed)
Called patient. States her ZIO patch came off yesterday. It was placed on 08/21/17. She re-attached it with some tape but has noticed the light has been flashing since this morning. Patient has not called the company yet. Advised patient to call the company number listed in the booklet for them to walk her through reattachment. If unsuccessful to please call us back so we may advise. Patient verbalized understanding.

## 2017-08-23 NOTE — Telephone Encounter (Signed)
Pt calling stating we placed a monitor on patient She wore it and last night it came off and so she tapped it back on to herself  Today she states the light was not on until an hour later  Would like to know if this is a problem   Please cal back

## 2017-09-06 DIAGNOSIS — R002 Palpitations: Secondary | ICD-10-CM | POA: Diagnosis not present

## 2017-09-09 ENCOUNTER — Encounter: Payer: Self-pay | Admitting: Family Medicine

## 2017-09-09 ENCOUNTER — Encounter: Payer: Self-pay | Admitting: Internal Medicine

## 2017-09-11 NOTE — Telephone Encounter (Signed)
Per TA patient is ok to schedule a lab visit to get the Pneumonia-23 vaccine/thx dmf

## 2017-09-13 ENCOUNTER — Other Ambulatory Visit: Payer: Self-pay

## 2017-09-13 ENCOUNTER — Encounter: Payer: Medicare Other | Attending: Physical Medicine & Rehabilitation | Admitting: Registered Nurse

## 2017-09-13 ENCOUNTER — Encounter: Payer: Self-pay | Admitting: Registered Nurse

## 2017-09-13 VITALS — BP 161/76 | HR 104

## 2017-09-13 DIAGNOSIS — M75102 Unspecified rotator cuff tear or rupture of left shoulder, not specified as traumatic: Secondary | ICD-10-CM | POA: Diagnosis not present

## 2017-09-13 DIAGNOSIS — Z79899 Other long term (current) drug therapy: Secondary | ICD-10-CM | POA: Insufficient documentation

## 2017-09-13 DIAGNOSIS — G8929 Other chronic pain: Secondary | ICD-10-CM | POA: Diagnosis not present

## 2017-09-13 DIAGNOSIS — G894 Chronic pain syndrome: Secondary | ICD-10-CM | POA: Diagnosis not present

## 2017-09-13 DIAGNOSIS — M25561 Pain in right knee: Secondary | ICD-10-CM | POA: Diagnosis not present

## 2017-09-13 DIAGNOSIS — I2510 Atherosclerotic heart disease of native coronary artery without angina pectoris: Secondary | ICD-10-CM | POA: Diagnosis not present

## 2017-09-13 DIAGNOSIS — M25562 Pain in left knee: Secondary | ICD-10-CM | POA: Diagnosis not present

## 2017-09-13 DIAGNOSIS — M25512 Pain in left shoulder: Secondary | ICD-10-CM

## 2017-09-13 DIAGNOSIS — Z5181 Encounter for therapeutic drug level monitoring: Secondary | ICD-10-CM | POA: Diagnosis not present

## 2017-09-13 DIAGNOSIS — M47817 Spondylosis without myelopathy or radiculopathy, lumbosacral region: Secondary | ICD-10-CM

## 2017-09-13 DIAGNOSIS — M7541 Impingement syndrome of right shoulder: Secondary | ICD-10-CM | POA: Insufficient documentation

## 2017-09-13 MED ORDER — IBUPROFEN 800 MG PO TABS: ORAL_TABLET | ORAL | 6 refills | Status: AC

## 2017-09-13 MED ORDER — HYDROCODONE-ACETAMINOPHEN 7.5-325 MG PO TABS: 1.0000 | ORAL_TABLET | Freq: Three times a day (TID) | ORAL | 0 refills | Status: DC

## 2017-09-13 MED ORDER — BACLOFEN 10 MG PO TABS: ORAL_TABLET | ORAL | 2 refills | Status: AC

## 2017-09-13 MED ORDER — MORPHINE SULFATE ER 30 MG PO TBCR
30.0000 mg | EXTENDED_RELEASE_TABLET | Freq: Two times a day (BID) | ORAL | 0 refills | Status: DC
Start: 2017-09-13 — End: 2017-10-13

## 2017-09-13 NOTE — Progress Notes (Signed)
Subjective:    Patient ID: Theresa Huffman, female    DOB: [birth year], 71 y.o.   MRN: [id number]  HPI: [name] is a 71year old female who returns for follow up appointmentfor chronic pain and medication refill. She states her pain is located in her left shoulder,lower back pain and bilateral knee pain R>L. She rates her pain 7. Her current exercise regime is walking short distances using loftstrand crutches in her home and performing chair exercises daily.  [name] Morphine equivalent is 82.50 MME  Husband in room.  Last urine drug screen was performed on 02/09/2017, it was consistent. Oral Swab was Performed Today.   Pain Inventory Average Pain 7 Pain Right Now 7 My pain is burning and aching  In the last 24 hours, has pain interfered with the following? General activity 9 Relation with others 7 Enjoyment of life 8 What TIME of day is your pain at its worst? varies daytime and evening Sleep (in general) Fair  Pain is worse with: walking, bending and standing Pain improves with: rest and medication Relief from Meds: 6  Mobility ability to climb steps?  no do you drive?  yes use a wheelchair needs help with transfers  Function disabled: date disabled . I need assistance with the following:  meal prep, household duties and shopping  Neuro/Psych weakness numbness trouble walking depression  Prior Studies Any changes since last visit?  no  Physicians involved in your care Any changes since last visit?  no   Family History  Problem Relation Age of Onset  . Cancer Mother        lung and colon  . Stroke Sister    Social History   Socioeconomic History  . Marital status: Married    Spouse name: Not on file  . Number of children: 1  . Years of education: Not on file  . Highest education level: Not on file  Social Needs  . Financial resource strain: Not on file  . Food insecurity - worry: Not on file  . Food insecurity -  inability: Not on file  . Transportation needs - medical: Not on file  . Transportation needs - non-medical: Not on file  Occupational History  . Occupation: TEFL teacher: UNEMPLOYED  Tobacco Use  . Smoking status: Never Smoker  . Smokeless tobacco: Never Used  Substance and Sexual Activity  . Alcohol use: No    Alcohol/week: 0.0 oz  . Drug use: No  . Sexual activity: Not on file  Other Topics Concern  . Not on file  Social History Narrative   Lives in [place], one adult child      Caffeine use: coffee daily in am   Past Surgical History:  Procedure Laterality Date  . CHOLECYSTECTOMY    . TUBAL LIGATION     Past Medical History:  Diagnosis Date  . Chronic respiratory failure with hypoxia (HCC)    a. followed by pulmonary  . Coronary artery disease, non-occlusive    a. Lexiscan 05/12/16: smal defect of moderate severity in the mid anterior and apical location partially reversible at rest. EF 42%. TID ratio uincreased at 1.23 possibly c/w balanced ischemia. Frequent PVCs; b. LHC 05/2016: near nl with the exception of pRCA 20%, EF 55%  . Diabetes mellitus type 2 in obese ([place])   . Diastolic dysfunction    a. TTE 04/2016: EF 55-60%, nl WM, Gr1DD, nl RV sys fxn, PASP nl  . Essential hypertension   .  Facet syndrome, lumbar   . GERD (gastroesophageal reflux disease)   . Hyperlipidemia   . Hypothyroidism   . Lumbosacral spondylosis without myelopathy   . Lymphedema   . Morbid obesity ([place])   . Primary localized osteoarthrosis, lower leg   . Sleep apnea    There were no vitals taken for this visit.  Opioid Risk Score:  0 Fall Risk Score:  `1  Depression screen PHQ 2/9  Depression screen [facility] 2/9 08/11/2017 07/17/2017 06/14/2017 04/17/2017 01/24/2017 09/26/2016 04/20/2016  Decreased Interest 1 1 1 1  0 0 0  Down, Depressed, Hopeless 1 1 1 1  0 3 0  PHQ - 2 Score 2 2 2 2  0 3 0  Altered sleeping - - - - - 0 -  Tired, decreased energy - - - - - 3 -  Change in appetite  - - - - - 3 -  Feeling bad or failure about yourself  - - - - - 2 -  Trouble concentrating - - - - - 0 -  Moving slowly or fidgety/restless - - - - - 3 -  Suicidal thoughts - - - - - 0 -  PHQ-9 Score - - - - - 14 -  Difficult doing work/chores - - - - - Not difficult at all -  Some recent data might be hidden    Review of Systems  HENT: Negative.   Eyes: Negative.   Respiratory: Negative.   Cardiovascular: Negative.   Gastrointestinal: Negative.   Endocrine: Negative.   Genitourinary: Negative.   Musculoskeletal: Positive for gait problem.  Skin: Negative.   Allergic/Immunologic: Negative.   Hematological: Negative.   Psychiatric/Behavioral: Negative.   All other systems reviewed and are negative.      Objective:   Physical Exam  Constitutional: She is oriented to person, place, and time. She appears well-developed and well-nourished.  HENT:  Head: Normocephalic and atraumatic.  Neck: Normal range of motion. Neck supple.  Cardiovascular: Normal rate and regular rhythm.  Pulmonary/Chest: Effort normal and breath sounds normal.  Continuous Oxygen @ 2 liters nasal cannula  Musculoskeletal:  Normal Muscle Bulk and Muscle Testing Reveals: Upper Extremities:Full  ROM and Muscle Strength 5/5 Left  AC Joint Tenderness Lumbar Paraspinal Tenderness: L-3-L-5 Lower Extremities: Full  ROM and Muscle Strength 5/5 Arrived in Motorized Wheelchair    Neurological: She is alert and oriented to person, place, and time.  Skin: Skin is warm and dry.  Psychiatric: She has a normal mood and affect.  Nursing note and vitals reviewed.         Assessment & Plan:  1. Lumbar pain chronic, no radiculopathy:Lumbar degenerative disc L4-5 and L5-S1. Continue Gabapentin: 09/13/2017 : Refilled: HYDRcodone 7.5/325mg  one tablet TID #90.  MS CONTIN 30 mg one every 12 hours #60.  We will continue the opioid monitoring program,this consists of regular clinic visits, examinations, urine drug  screen, pill counts as well as use of [place] Controlled Substance reporting System. 2. Bilateral knee osteoarthritis: S/P Right Knee Injection on 03/09/2017 with relief noted. Continue with Exercise and Heat therapy. Continue current medication regime. 09/13/2017 3. Morbid Obesity: Continue with Diabetic Diet Regime and Exercise Regimen. 08/11/2017 4. Muscle Spasms: Continue Baclofen. 09/13/2017  20 minutes of face to face patient care time was spent during this visit. All questions were encouraged and answered.  F/U in 1 month

## 2017-09-17 LAB — DRUG TOX MONITOR 1 W/CONF, ORAL FLD
AMPHETAMINES: NEGATIVE ng/mL (ref <10)
BARBITURATES: NEGATIVE ng/mL (ref <10)
BENZODIAZEPINES: NEGATIVE ng/mL (ref <0.50)
BUPRENORPHINE: NEGATIVE ng/mL (ref <0.10)
COCAINE: NEGATIVE ng/mL (ref <5.0)
CODEINE: NEGATIVE ng/mL (ref <2.5)
Dihydrocodeine: NEGATIVE ng/mL (ref <2.5)
Fentanyl: NEGATIVE ng/mL (ref <0.10)
HEROIN METABOLITE: NEGATIVE ng/mL (ref <1.0)
HYDROMORPHONE: NEGATIVE ng/mL (ref <2.5)
Hydrocodone: 4.1 ng/mL — ABNORMAL HIGH (ref <2.5)
MARIJUANA: NEGATIVE ng/mL (ref <2.5)
MDMA: NEGATIVE ng/mL (ref <10)
MEPROBAMATE: NEGATIVE ng/mL (ref <2.5)
METHADONE: NEGATIVE ng/mL (ref <5.0)
MORPHINE: NEGATIVE ng/mL (ref <2.5)
NORHYDROCODONE: NEGATIVE ng/mL (ref <2.5)
NOROXYCODONE: NEGATIVE ng/mL (ref <2.5)
Nicotine Metabolite: NEGATIVE ng/mL (ref <5.0)
OXYCODONE: NEGATIVE ng/mL (ref <2.5)
Opiates: POSITIVE ng/mL — AB (ref <2.5)
Oxymorphone: NEGATIVE ng/mL (ref <2.5)
Phencyclidine: NEGATIVE ng/mL (ref <10)
TAPENTADOL: NEGATIVE ng/mL (ref <5.0)
Tramadol: NEGATIVE ng/mL (ref <5.0)
Zolpidem: NEGATIVE ng/mL (ref <5.0)

## 2017-09-17 LAB — DRUG TOX ALC METAB W/CON, ORAL FLD: Alcohol Metabolite: NEGATIVE ng/mL (ref <25)

## 2017-09-20 ENCOUNTER — Telehealth: Payer: Self-pay | Admitting: *Deleted

## 2017-09-20 NOTE — Telephone Encounter (Signed)
Oral swab drug screen was consistent for prescribed medications.  ?

## 2017-09-25 ENCOUNTER — Ambulatory Visit: Payer: Medicare Other | Admitting: Physician Assistant

## 2017-10-03 DIAGNOSIS — I25110 Atherosclerotic heart disease of native coronary artery with unstable angina pectoris: Secondary | ICD-10-CM | POA: Diagnosis not present

## 2017-10-03 DIAGNOSIS — Z1379 Encounter for other screening for genetic and chromosomal anomalies: Secondary | ICD-10-CM | POA: Diagnosis not present

## 2017-10-03 DIAGNOSIS — E785 Hyperlipidemia, unspecified: Secondary | ICD-10-CM | POA: Diagnosis not present

## 2017-10-03 DIAGNOSIS — F3130 Bipolar disorder, current episode depressed, mild or moderate severity, unspecified: Secondary | ICD-10-CM | POA: Diagnosis not present

## 2017-10-03 DIAGNOSIS — F339 Major depressive disorder, recurrent, unspecified: Secondary | ICD-10-CM | POA: Diagnosis not present

## 2017-10-04 ENCOUNTER — Encounter: Payer: Self-pay | Admitting: Physician Assistant

## 2017-10-04 NOTE — Progress Notes (Signed)
Cardiology Office Note Date:  10/05/2017  Patient ID:  Theresa Huffman, Theresa Huffman 1946/08/18, MRN [id number] PCP:  Lucille Passy, MD  Cardiologist:  Former [facility] patient    Chief Complaint: Follow up  History of Present Illness: Theresa Huffman is a 71 y.o. female with history of nonobstructive CAD by [facility] 05/2016 as detailed below, pSVT noted on cardiac monitoring, frequent PVCs, diastolic dysfunction, chronic hypoxic respiratory failure that is multifactorial including morbid obesity, OHS/OSA on CPAP with reactive airway disease and probable COPD from second hand smoke, angioedema associated with ACEi, DM2, HTN, HLD, hypothyroidism, chronic low back pain, GERD, and lymphedema who presents for follow up of palpitations.   Previously followed by Dr, Yvone Neu, MD for evaluation of SOB. Because of her risk factors, she underwent CTA PE protocol in 04/2016 that was negative for PE. This was followed by Lexiscan Myoview in 04/2016 that showed a small defect of moderate severity in the mid anterior and apical anterior location that was partially reversible at rest. Unable to rule out ischemia. TID ratio was increased at 1.23, possible consistent with balanced ischemia. EF 42%. Frequent PVCs were noted. TTE 04/2016 showed an EF of 55-60%, normal wall motion, Gr1DD, normal RV systolic function, PASP normal. Because of the abnormal stress test she underwent LHC on 06/02/2016 that showed near normal coronary arteries with only 20% proximal RCA stenosis. Normal LV systolic function with an EF of 55% and moderately elevated systemtic pressure and moderately elevated LVEDP. Her stress test was a false positive and continued treatment of risk factors was advised. It was felt her SOB was non-cardiac and follow up with PCP was advised. She was seen by pulmonary who ordered PFTs (results unavailable) and sleep study that was positive for OSA. She was most recently seen on 10/22 for routine follow up and was doing reasonably  well at that time. She did note intermittent chest discomfort that was short-lived and was started on Imdur. She also noted some palpitations. She wore a 7-day Zio monitor that showed an overal sinus rhythm with 2 episodes of SVT, the longest lasting 5 beats at 118 bpm. Rare PACs/PVCs were noted. Labs from 10/22 showed a magnesium of 1.6 (PO magnesium oxide was recommended), potassium at goal, normal renal function, and unremarkable cbc. Recent tsh from 12/2016 was normal.   She comes in today doing well. Her respiratory status is good. She has not had any further palpitations. She has noted some left scapula pain and left lateral chest wall pain since Thanksgiving Day. She reports this pain developed after she went to pick up her nephew. Pain is worse with movement of the left arm. She is otherwise without complaints. She has not noticed any changes with the addition of Imdur.    Past Medical History:  Diagnosis Date  . Chronic respiratory failure with hypoxia (HCC)    a. followed by pulmonary  . Coronary artery disease, non-occlusive    a. Lexiscan 05/12/16: smal defect of moderate severity in the mid anterior and apical location partially reversible at rest. EF 42%. TID ratio uincreased at 1.23 possibly c/w balanced ischemia. Frequent PVCs; b. LHC 05/2016: near nl with the exception of pRCA 20%, EF 55%  . Diabetes mellitus type 2 in obese ([place])   . Diastolic dysfunction    a. TTE 04/2016: EF 55-60%, nl WM, Gr1DD, nl RV sys fxn, PASP nl  . Essential hypertension   . Facet syndrome, lumbar   . GERD (gastroesophageal reflux disease)   .  Hyperlipidemia   . Hypothyroidism   . Lumbosacral spondylosis without myelopathy   . Lymphedema   . Morbid obesity ([place])   . Primary localized osteoarthrosis, lower leg   . Sleep apnea   . SVT (supraventricular tachycardia) ([place])    a. 08/2017: 7-day Zio monitor that showed an overal sinus rhythm with 2 episodes of SVT, the longest lasting 5 beats at 118 bpm.  Rare PACs/PVCs were noted    Past Surgical History:  Procedure Laterality Date  . CARDIAC CATHETERIZATION N/A 06/02/2016   Procedure: Left Heart Cath and Coronary Angiography;  Surgeon: [name], MD;  Location: [facility];  Service: Cardiovascular;  Laterality: N/A;  . CHOLECYSTECTOMY    . TUBAL LIGATION      Current Meds  Medication Sig  . amLODipine (NORVASC) 5 MG tablet Take 1 tablet (5 mg total) by mouth daily.  Marland Kitchen aspirin EC 81 MG tablet Take 81 mg by mouth daily.   . baclofen (LIORESAL) 10 MG tablet TAKE ONE (1) TABLET BY MOUTH twice a day as needed  . budesonide (PULMICORT) 0.5 MG/2ML nebulizer solution Take 2 mLs (0.5 mg total) by nebulization 2 (two) times daily.  . Calcium Carbonate-Vitamin D 600-400 MG-UNIT per tablet Take 1 tablet by mouth daily. 1200mg  once daily  . furosemide (LASIX) 40 MG tablet TAKE 1 TABLET BY MOUTH TWICE A DAY  . gabapentin (NEURONTIN) 300 MG capsule TAKE TWO CAPSULES BY MOUTH THREE TIMES DAILY  . glipiZIDE (GLUCOTROL) 10 MG tablet TAKE 1/2 TABLET BY MOUTH TWICE DAILY BEFORE A MEAL  . HYDROcodone-acetaminophen (NORCO) 7.5-325 MG tablet Take 1 tablet 3 (three) times daily by mouth.  Marland Kitchen ibuprofen (ADVIL,MOTRIN) 800 MG tablet TAKE ONE (1) TABLET BY MOUTH THREE (3) TIMES EACH DAY  . Insulin NPH, Human,, Isophane, (HUMULIN N) 100 UNIT/ML Kiwkpen Inject units 20 units before breakfast and 15 units before bedtime (Patient taking differently: Inject units 25 units before breakfast and 20 units before bedtime)  . isosorbide mononitrate (IMDUR) 30 MG 24 hr tablet Take 1 tablet (30 mg total) by mouth daily.  . Magnesium Oxide 400 (240 Mg) MG TABS Take 1 tablet (400 mg total) by mouth daily.  . metFORMIN (GLUCOPHAGE) 1000 MG tablet TAKE 1 TABLET BY MOUTH TWICE A DAY WITH A MEAL.  Marland Kitchen morphine (MS CONTIN) 30 MG 12 hr tablet Take 1 tablet (30 mg total) every 12 (twelve) hours by mouth.  . potassium chloride SA (K-DUR,KLOR-CON) 20 MEQ tablet Take 1 tablet  (20 mEq total) by mouth daily.  Marland Kitchen PROAIR HFA 108 (90 Base) MCG/ACT inhaler INHALE 2 PUFFS INTO THE LUNGS EVERY 4 HOURS AS NEEDED  . simvastatin (ZOCOR) 20 MG tablet TAKE 1 TABLET BY MOUTH DAILY  . SYNTHROID 200 MCG tablet TAKE ONE TABLET BY MOUTH EVERY MORNING BEFORE BREAKFAST  . venlafaxine (EFFEXOR) 75 MG tablet TAKE THREE TABLETS BY MOUTH EVERY NIGHT AT BEDTIME    Allergies:   Ace inhibitors; Erythromycin; Invokana [canagliflozin]; and Other   Social History:  The patient  reports that  has never smoked. she has never used smokeless tobacco. She reports that she does not drink alcohol or use drugs.   Family History:  The patient's family history includes Cancer in her mother; Stroke in her sister.  ROS:   Review of Systems  Constitutional: Positive for malaise/fatigue. Negative for chills, diaphoresis, fever and weight loss.  HENT: Negative for congestion.   Eyes: Negative for discharge and redness.  Respiratory: Negative for cough, hemoptysis,  sputum production, shortness of breath and wheezing.   Cardiovascular: Positive for chest pain and leg swelling. Negative for palpitations, orthopnea, claudication and PND.  Gastrointestinal: Negative for abdominal pain, blood in stool, heartburn, melena, nausea and vomiting.  Genitourinary: Negative for hematuria.  Musculoskeletal: Positive for joint pain and myalgias. Negative for falls.  Skin: Negative for rash.  Neurological: Positive for weakness. Negative for dizziness, tingling, tremors, sensory change, speech change, focal weakness and loss of consciousness.  Endo/Heme/Allergies: Does not bruise/bleed easily.  Psychiatric/Behavioral: Negative for substance abuse. The patient is not nervous/anxious.   All other systems reviewed and are negative.    PHYSICAL EXAM:  VS:  BP 120/68 (BP Location: Right Wrist, Patient Position: Sitting, Cuff Size: Normal)   Pulse 99   Ht 5\' 4"  (1.626 m)   Wt (!) 380 lb (172.4 kg)   BMI 65.23 kg/m  BMI:  Body mass index is 65.23 kg/m.  Physical Exam  Constitutional: She is oriented to person, place, and time. She appears well-developed and well-nourished.  HENT:  Head: Normocephalic and atraumatic.  Eyes: Right eye exhibits no discharge. Left eye exhibits no discharge.  Neck: Normal range of motion. No JVD present.  Cardiovascular: Normal rate, regular rhythm, S1 normal, S2 normal and normal heart sounds. Exam reveals no distant heart sounds, no friction rub, no midsystolic click and no opening snap.  No murmur heard. Pulmonary/Chest: Effort normal and breath sounds normal. No respiratory distress. She has no decreased breath sounds. She has no wheezes. She has no rales. She exhibits no tenderness.  Abdominal: Soft. She exhibits no distension. There is no tenderness.  Musculoskeletal: She exhibits edema.  Trace bilateral LE edema  Neurological: She is alert and oriented to person, place, and time.  Skin: Skin is warm and dry. No cyanosis. Nails show no clubbing.  Psychiatric: She has a normal mood and affect. Her speech is normal and behavior is normal. Judgment and thought content normal.     EKG:  Was ordered and interpreted by me today. Shows NSR, 96 bpm, LVH with early repolarization, no acute st/t changes  Recent Labs: 01/24/2017: ALT 24; TSH 0.75 08/21/2017: BUN 14; Creatinine, Ser 0.78; Hemoglobin 12.0; Magnesium 1.6; Platelets 281; Potassium 4.1; Sodium 145  01/24/2017: Cholesterol 175; HDL 69.40; LDL Cholesterol 81; Total CHOL/HDL Ratio 3; Triglycerides 122.0; VLDL 24.4   CrCl cannot be calculated (Patient's most recent lab result is older than the maximum 21 days allowed.).   Wt Readings from Last 3 Encounters:  10/05/17 (!) 380 lb (172.4 kg)  08/21/17 (!) 380 lb (172.4 kg)  07/13/17 (!) 380 lb (172.4 kg)     Other studies reviewed: Additional studies/records reviewed today include: summarized above  ASSESSMENT AND PLAN:  1. Palpitations: Recent 7-day Zio monitor  showed overall sinus rhythm with 2 episodes of SVT, the longest lasting 5 beats at 118 bpm along with rare PACs/PVCs. She has since noted a decrease in her palpitations. Trial of prn diltiazem 30 mg tid prn tachy-palpitations. Check magnesium level s/p PO repletion. Recent tsh and potassium at ok. If she notes increase in palpitations or needing to regularly take prn diltiazem she has been advised to call our office as I would likely just stop her amlodipine and place her on Cardizem in an effort to reduce duplicate therapy. I will avoid beta blocker at this time given her underlying respiratory failure and asthma.   2. Nonobstructive CAD: Recent LHC showing essentially normal coronary arteries. She can stop Imdur. Her left scapula  and chest wall pain are MSK in etiology stemming from picking up her nephew. No plans for further ischemia work up at this time.   3. Diastolic dysfunction:She does not appear volume overloaded at this time, though her volume status is difficult to assess given her body habitus.   4. Chronic respiratory failure with hypoxia: Stable. Followed by pulmonary. Felt to be multifactorial including reactive airway disease with probable COPD from 2nd hand smoke exposure, morbid obesity with PSA/OHS and diastolic dysfunction.  5. HTN: Blood pressure well controlled today.   6. HLD: Simvastatin. Followed by PCP. Consider changing to high-intensity statin.  7. Lymphedema: Related to body habitus. Elevate legs when able. Uncertain if she would tolerate compression stockings.  Disposition: F/u with Dr. Saunders Revel in 6 months.   Current medicines are reviewed at length with the patient today.  The patient did not have any concerns regarding medicines.  Melvern Banker PA-C 10/05/2017 4:32 PM     [facility] [address] [place], [place] [address] [phone]

## 2017-10-05 ENCOUNTER — Ambulatory Visit (INDEPENDENT_AMBULATORY_CARE_PROVIDER_SITE_OTHER): Payer: Medicare Other | Admitting: Physician Assistant

## 2017-10-05 ENCOUNTER — Encounter: Payer: Self-pay | Admitting: Physician Assistant

## 2017-10-05 VITALS — BP 120/68 | HR 99 | Ht 64.0 in | Wt 380.0 lb

## 2017-10-05 DIAGNOSIS — R002 Palpitations: Secondary | ICD-10-CM

## 2017-10-05 DIAGNOSIS — I519 Heart disease, unspecified: Secondary | ICD-10-CM | POA: Diagnosis not present

## 2017-10-05 DIAGNOSIS — I471 Supraventricular tachycardia, unspecified: Secondary | ICD-10-CM

## 2017-10-05 DIAGNOSIS — I2510 Atherosclerotic heart disease of native coronary artery without angina pectoris: Secondary | ICD-10-CM | POA: Insufficient documentation

## 2017-10-05 DIAGNOSIS — J9611 Chronic respiratory failure with hypoxia: Secondary | ICD-10-CM

## 2017-10-05 DIAGNOSIS — I10 Essential (primary) hypertension: Secondary | ICD-10-CM | POA: Diagnosis not present

## 2017-10-05 DIAGNOSIS — I5189 Other ill-defined heart diseases: Secondary | ICD-10-CM

## 2017-10-05 DIAGNOSIS — E782 Mixed hyperlipidemia: Secondary | ICD-10-CM

## 2017-10-05 MED ORDER — DILTIAZEM HCL 30 MG PO TABS: 30.0000 mg | ORAL_TABLET | Freq: Three times a day (TID) | ORAL | 5 refills | Status: AC | PRN

## 2017-10-05 NOTE — Patient Instructions (Addendum)
Medication Instructions:  Your physician has recommended you make the following change in your medication:  START taking diltiazem 30mg  three times daily as needed for palpitations.    Labwork: Magnesium level today  Testing/Procedures: none  Follow-Up: Your physician wants you to follow-up in: 6 months with Dr. Saunders Revel.  You will receive a reminder letter in the mail two months in advance. If you don't receive a letter, please call our office to schedule the follow-up appointment.   Any Other Special Instructions Will Be Listed Below (If Applicable).     If you need a refill on your cardiac medications before your next appointment, please call your pharmacy.  Diltiazem tablets What is this medicine? DILTIAZEM (dil TYE a zem) is a calcium-channel blocker. It affects the amount of calcium found in your heart and muscle cells. This relaxes your blood vessels, which can reduce the amount of work the heart has to do. This medicine is used to treat chest pain caused by angina. This medicine may be used for other purposes; ask your health care provider or pharmacist if you have questions. COMMON BRAND NAME(S): Cardizem What should I tell my health care provider before I take this medicine? They need to know if you have any of these conditions: -heart problems, low blood pressure, irregular heartbeat -liver disease -previous heart attack -an unusual or allergic reaction to diltiazem, other medicines, foods, dyes, or preservatives -pregnant or trying to get pregnant -breast-feeding How should I use this medicine? Take this medicine by mouth with a glass of water. Follow the directions on the prescription label. Do not cut, crush or chew this medicine. This medicine is usually taken before meals and at bedtime. Take your doses at regular intervals. Do not take your medicine more often then directed. Do not stop taking except on the advice of your doctor or health care professional. Talk to  your pediatrician regarding the use of this medicine in children. Special care may be needed. Overdosage: If you think you have taken too much of this medicine contact a poison control center or emergency room at once. NOTE: This medicine is only for you. Do not share this medicine with others. What if I miss a dose? If you miss a dose, take it as soon as you can. If it is almost time for your next dose, take only that dose. Do not take double or extra doses. What may interact with this medicine? Do not take this medicine with any of the following: -cisapride -hawthorn -pimozide -ranolazine -red yeast rice This medicine may also interact with the following medications: -buspirone -carbamazepine -cimetidine -cyclosporine -digoxin -local anesthetics or general anesthetics -lovastatin -medicines for anxiety or difficulty sleeping like midazolam and triazolam -medicines for high blood pressure or heart problems -quinidine -rifampin, rifabutin, or rifapentine This list may not describe all possible interactions. Give your health care provider a list of all the medicines, herbs, non-prescription drugs, or dietary supplements you use. Also tell them if you smoke, drink alcohol, or use illegal drugs. Some items may interact with your medicine. What should I watch for while using this medicine? Check your blood pressure and pulse rate regularly. Ask your doctor or health care professional what your blood pressure and pulse rate should be and when you should contact him or her. You may feel dizzy or lightheaded. Do not drive, use machinery, or do anything that needs mental alertness until you know how this medicine affects you. To reduce the risk of dizzy or fainting spells,  do not sit or stand up quickly, especially if you are an older patient. Alcohol can make you more dizzy or increase flushing and rapid heartbeats. Avoid alcoholic drinks. What side effects may I notice from receiving this  medicine? Side effects that you should report to your doctor or health care professional as soon as possible: -allergic reactions like skin rash, itching or hives, swelling of the face, lips, or tongue -confusion, mental depression -feeling faint or lightheaded, falls -pinpoint red spots on the skin -redness, blistering, peeling or loosening of the skin, including inside the mouth -slow, irregular heartbeat -swelling of the ankles, feet -unusual bleeding or bruising Side effects that usually do not require medical attention (report to your doctor or health care professional if they continue or are bothersome): -change in sex drive or performance -constipation or diarrhea -flushing of the face -headache -nausea, vomiting -tired or weak -trouble sleeping This list may not describe all possible side effects. Call your doctor for medical advice about side effects. You may report side effects to FDA at 1-800-FDA-1088. Where should I keep my medicine? Keep out of the reach of children. Store at room temperature between 20 and 25 degrees C (68 and 77 degrees F). Protect from light. Keep container tightly closed. Throw away any unused medicine after the expiration date. NOTE: This sheet is a summary. It may not cover all possible information. If you have questions about this medicine, talk to your doctor, pharmacist, or health care provider.  2018 Elsevier/Gold Standard (2013-09-30 10:54:31)

## 2017-10-06 DIAGNOSIS — M19012 Primary osteoarthritis, left shoulder: Secondary | ICD-10-CM | POA: Diagnosis not present

## 2017-10-06 DIAGNOSIS — M25512 Pain in left shoulder: Secondary | ICD-10-CM | POA: Diagnosis not present

## 2017-10-06 LAB — MAGNESIUM: MAGNESIUM: 1.8 mg/dL (ref 1.6–2.3)

## 2017-10-09 ENCOUNTER — Ambulatory Visit: Payer: Medicare Other | Admitting: Physical Medicine & Rehabilitation

## 2017-10-09 ENCOUNTER — Other Ambulatory Visit: Payer: Self-pay | Admitting: Family Medicine

## 2017-10-11 ENCOUNTER — Other Ambulatory Visit: Payer: Self-pay

## 2017-10-11 MED ORDER — MAGNESIUM OXIDE -MG SUPPLEMENT 400 (240 MG) MG PO TABS: 1.0000 | ORAL_TABLET | Freq: Two times a day (BID) | ORAL | 0 refills | Status: AC

## 2017-10-13 ENCOUNTER — Ambulatory Visit: Payer: Medicare Other | Admitting: Registered Nurse

## 2017-10-13 ENCOUNTER — Encounter: Payer: Medicare Other | Attending: Physical Medicine & Rehabilitation | Admitting: Registered Nurse

## 2017-10-13 ENCOUNTER — Encounter: Payer: Self-pay | Admitting: Registered Nurse

## 2017-10-13 ENCOUNTER — Other Ambulatory Visit: Payer: Self-pay

## 2017-10-13 VITALS — BP 167/78 | HR 93

## 2017-10-13 DIAGNOSIS — M7542 Impingement syndrome of left shoulder: Secondary | ICD-10-CM | POA: Diagnosis not present

## 2017-10-13 DIAGNOSIS — M7541 Impingement syndrome of right shoulder: Secondary | ICD-10-CM | POA: Diagnosis not present

## 2017-10-13 DIAGNOSIS — M47817 Spondylosis without myelopathy or radiculopathy, lumbosacral region: Secondary | ICD-10-CM | POA: Diagnosis not present

## 2017-10-13 DIAGNOSIS — M75102 Unspecified rotator cuff tear or rupture of left shoulder, not specified as traumatic: Secondary | ICD-10-CM | POA: Diagnosis not present

## 2017-10-13 DIAGNOSIS — I2510 Atherosclerotic heart disease of native coronary artery without angina pectoris: Secondary | ICD-10-CM

## 2017-10-13 DIAGNOSIS — G894 Chronic pain syndrome: Secondary | ICD-10-CM | POA: Diagnosis not present

## 2017-10-13 DIAGNOSIS — Z79899 Other long term (current) drug therapy: Secondary | ICD-10-CM

## 2017-10-13 DIAGNOSIS — M25562 Pain in left knee: Secondary | ICD-10-CM | POA: Diagnosis not present

## 2017-10-13 DIAGNOSIS — G8929 Other chronic pain: Secondary | ICD-10-CM | POA: Diagnosis not present

## 2017-10-13 DIAGNOSIS — M25561 Pain in right knee: Secondary | ICD-10-CM | POA: Insufficient documentation

## 2017-10-13 DIAGNOSIS — Z5181 Encounter for therapeutic drug level monitoring: Secondary | ICD-10-CM | POA: Diagnosis not present

## 2017-10-13 MED ORDER — MORPHINE SULFATE ER 30 MG PO TBCR: 30.0000 mg | EXTENDED_RELEASE_TABLET | Freq: Two times a day (BID) | ORAL | 0 refills | Status: DC

## 2017-10-13 MED ORDER — HYDROCODONE-ACETAMINOPHEN 7.5-325 MG PO TABS: 1.0000 | ORAL_TABLET | Freq: Three times a day (TID) | ORAL | 0 refills | Status: DC

## 2017-10-13 NOTE — Progress Notes (Signed)
Subjective:    Patient ID: Theresa Huffman, female    DOB: [birth year], 71 y.o.   MRN: [id number]  HPI: [name] is a 71year old female who returns for follow up appointmentfor chronic pain and medication refill. She states her pain is located in her left shoulder,lower back pain and right knee pain. She rates her pain 8. Her current exercise regime is walking short distances using loftstrand crutches in her home and performing chair exercises daily.  Theresa Huffman reports she had a Left Shoulder Cortisone Injection at her [place] office ( Dr. Cay Schillings) on 10/06/2017, no relief noted.   Theresa Huffman Morphine equivalent is 82.50 MME  Husband in room.  Last urine drug screen was performed on 02/09/2017, it was consistent. Oral Swab was Performed Today.   Pain Inventory Average Pain 7 Pain Right Now 8 My pain is burning and aching  In the last 24 hours, has pain interfered with the following? General activity 8 Relation with others 7 Enjoyment of life 8 What TIME of day is your pain at its worst? morning and daytime Sleep (in general) Fair  Pain is worse with: walking, bending and standing Pain improves with: rest and medication Relief from Meds: 6  Mobility ability to climb steps?  no do you drive?  yes use a wheelchair needs help with transfers  Function disabled: date disabled . I need assistance with the following:  meal prep, household duties and shopping  Neuro/Psych weakness numbness trouble walking depression  Prior Studies Any changes since last visit?  no  Physicians involved in your care Any changes since last visit?  no   Family History  Problem Relation Age of Onset  . Cancer Mother        lung and colon  . Stroke Sister    Social History   Socioeconomic History  . Marital status: Married    Spouse name: None  . Number of children: 1  . Years of education: None  . Highest education level: None  Social Needs  .  Financial resource strain: None  . Food insecurity - worry: None  . Food insecurity - inability: None  . Transportation needs - medical: None  . Transportation needs - non-medical: None  Occupational History  . Occupation: TEFL teacher: UNEMPLOYED  Tobacco Use  . Smoking status: Never Smoker  . Smokeless tobacco: Never Used  Substance and Sexual Activity  . Alcohol use: No    Alcohol/week: 0.0 oz  . Drug use: No  . Sexual activity: None  Other Topics Concern  . None  Social History Narrative   Lives in [place], one adult child      Caffeine use: coffee daily in am   Past Surgical History:  Procedure Laterality Date  . CARDIAC CATHETERIZATION N/A 06/02/2016   Procedure: Left Heart Cath and Coronary Angiography;  Surgeon: [name], MD;  Location: [facility];  Service: Cardiovascular;  Laterality: N/A;  . CHOLECYSTECTOMY    . TUBAL LIGATION     Past Medical History:  Diagnosis Date  . Chronic respiratory failure with hypoxia (HCC)    a. followed by pulmonary  . Coronary artery disease, non-occlusive    a. Lexiscan 05/12/16: smal defect of moderate severity in the mid anterior and apical location partially reversible at rest. EF 42%. TID ratio uincreased at 1.23 possibly c/w balanced ischemia. Frequent PVCs; b. LHC 05/2016: near nl with the exception of pRCA 20%, EF 55%  .  Diabetes mellitus type 2 in obese ([place])   . Diastolic dysfunction    a. TTE 04/2016: EF 55-60%, nl WM, Gr1DD, nl RV sys fxn, PASP nl  . Essential hypertension   . Facet syndrome, lumbar   . GERD (gastroesophageal reflux disease)   . Hyperlipidemia   . Hypothyroidism   . Lumbosacral spondylosis without myelopathy   . Lymphedema   . Morbid obesity ([place])   . Primary localized osteoarthrosis, lower leg   . Sleep apnea   . SVT (supraventricular tachycardia) ([place])    a. 08/2017: 7-day Zio monitor that showed an overal sinus rhythm with 2 episodes of SVT, the longest lasting 5 beats  at 118 bpm. Rare PACs/PVCs were noted   BP (!) 167/78 (BP Location: Right Arm)   Pulse 93   SpO2 93%   Opioid Risk Score:  0 Fall Risk Score:  `1  Depression screen PHQ 2/9  Depression screen [facility] 2/9 08/11/2017 07/17/2017 06/14/2017 04/17/2017 01/24/2017 09/26/2016 04/20/2016  Decreased Interest 1 1 1 1  0 0 0  Down, Depressed, Hopeless 1 1 1 1  0 3 0  PHQ - 2 Score 2 2 2 2  0 3 0  Altered sleeping - - - - - 0 -  Tired, decreased energy - - - - - 3 -  Change in appetite - - - - - 3 -  Feeling bad or failure about yourself  - - - - - 2 -  Trouble concentrating - - - - - 0 -  Moving slowly or fidgety/restless - - - - - 3 -  Suicidal thoughts - - - - - 0 -  PHQ-9 Score - - - - - 14 -  Difficult doing work/chores - - - - - Not difficult at all -  Some recent data might be hidden    Review of Systems  HENT: Negative.   Eyes: Negative.   Respiratory: Negative.   Cardiovascular: Negative.   Gastrointestinal: Negative.   Endocrine: Negative.   Genitourinary: Negative.   Musculoskeletal: Positive for gait problem.  Skin: Negative.   Allergic/Immunologic: Negative.   Neurological: Positive for weakness and numbness.  Hematological: Negative.   Psychiatric/Behavioral: Positive for dysphoric mood.  All other systems reviewed and are negative.      Objective:   Physical Exam  Constitutional: She is oriented to person, place, and time. She appears well-developed and well-nourished.  HENT:  Head: Normocephalic and atraumatic.  Neck: Normal range of motion. Neck supple.  Cardiovascular: Normal rate and regular rhythm.  Pulmonary/Chest: [place] normal and breath sounds normal.  Continuous Oxygen @ 2 liters nasal cannula  Musculoskeletal:  Normal Muscle Bulk and Muscle Testing Reveals: Upper Extremities:Full  ROM and Muscle Strength 5/5 Left: Decreased ROM 90 Degrees and Muscle Strength 4/5 Left  AC Joint Tenderness Thoracic Paraspinal Tenderness: T-1-T-3 Lumbar Paraspinal  Tenderness: L-3-L-5 Lower Extremities: Full  ROM and Muscle Strength 5/5 Arrived in Motorized Wheelchair    Neurological: She is alert and oriented to person, place, and time.  Skin: Skin is warm and dry.  Psychiatric: She has a normal mood and affect.  Nursing note and vitals reviewed.         Assessment & Plan:  1. Lumbar pain chronic, no radiculopathy:Lumbar degenerative disc L4-5 and L5-S1. Continue Gabapentin: 10/13/2017 : Refilled: HYDRcodone 7.5/325mg  one tablet TID #90.  MS CONTIN 30 mg one every 12 hours #60.  We will continue the opioid monitoring program,this consists of regular clinic visits, examinations, urine drug screen, pill  counts as well as use of [place] Controlled Substance reporting System. 2. Bilateral knee osteoarthritis: S/P Right Knee Injection on 03/09/2017 with relief noted. Continue with Exercise and Heat therapy. Continue current medication regime. 10/13/2017 3. Morbid Obesity: Continue with Diabetic Diet Regime and Exercise Regimen. 10/13/2017 4. Muscle Spasms: Continue Baclofen. 10/13/2017  20 minutes of face to face patient care time was spent during this visit. All questions were encouraged and answered.  F/U in 1 month

## 2017-10-15 ENCOUNTER — Other Ambulatory Visit: Payer: Self-pay | Admitting: Internal Medicine

## 2017-10-15 ENCOUNTER — Other Ambulatory Visit: Payer: Self-pay | Admitting: Family Medicine

## 2017-10-16 NOTE — Telephone Encounter (Signed)
This was sent to you for #90 on 12.12.18/thx dmf

## 2017-10-17 ENCOUNTER — Other Ambulatory Visit: Payer: Self-pay | Admitting: *Deleted

## 2017-10-17 MED ORDER — BUDESONIDE 0.5 MG/2ML IN SUSP: 0.5000 mg | Freq: Two times a day (BID) | RESPIRATORY_TRACT | 0 refills | Status: DC

## 2017-11-14 ENCOUNTER — Ambulatory Visit: Payer: Medicare Other | Admitting: Registered Nurse

## 2017-11-15 ENCOUNTER — Other Ambulatory Visit: Payer: Self-pay | Admitting: Internal Medicine

## 2017-11-15 MED ORDER — BUDESONIDE 0.5 MG/2ML IN SUSP: 0.5000 mg | Freq: Two times a day (BID) | RESPIRATORY_TRACT | 0 refills | Status: AC

## 2017-11-16 ENCOUNTER — Encounter: Payer: Medicare Other | Attending: Physical Medicine & Rehabilitation | Admitting: Registered Nurse

## 2017-11-16 ENCOUNTER — Other Ambulatory Visit: Payer: Self-pay

## 2017-11-16 ENCOUNTER — Encounter: Payer: Self-pay | Admitting: Registered Nurse

## 2017-11-16 VITALS — BP 136/79 | HR 96

## 2017-11-16 DIAGNOSIS — M75102 Unspecified rotator cuff tear or rupture of left shoulder, not specified as traumatic: Secondary | ICD-10-CM | POA: Insufficient documentation

## 2017-11-16 DIAGNOSIS — Z5181 Encounter for therapeutic drug level monitoring: Secondary | ICD-10-CM

## 2017-11-16 DIAGNOSIS — M47817 Spondylosis without myelopathy or radiculopathy, lumbosacral region: Secondary | ICD-10-CM

## 2017-11-16 DIAGNOSIS — M7542 Impingement syndrome of left shoulder: Secondary | ICD-10-CM | POA: Diagnosis not present

## 2017-11-16 DIAGNOSIS — M25562 Pain in left knee: Secondary | ICD-10-CM | POA: Diagnosis not present

## 2017-11-16 DIAGNOSIS — G8929 Other chronic pain: Secondary | ICD-10-CM

## 2017-11-16 DIAGNOSIS — G894 Chronic pain syndrome: Secondary | ICD-10-CM

## 2017-11-16 DIAGNOSIS — Z79899 Other long term (current) drug therapy: Secondary | ICD-10-CM

## 2017-11-16 DIAGNOSIS — M7541 Impingement syndrome of right shoulder: Secondary | ICD-10-CM | POA: Diagnosis not present

## 2017-11-16 DIAGNOSIS — M25561 Pain in right knee: Secondary | ICD-10-CM | POA: Diagnosis not present

## 2017-11-16 MED ORDER — MORPHINE SULFATE ER 30 MG PO TBCR: 30.0000 mg | EXTENDED_RELEASE_TABLET | Freq: Two times a day (BID) | ORAL | 0 refills | Status: AC

## 2017-11-16 MED ORDER — HYDROCODONE-ACETAMINOPHEN 7.5-325 MG PO TABS: 1.0000 | ORAL_TABLET | Freq: Three times a day (TID) | ORAL | 0 refills | Status: AC

## 2017-11-16 NOTE — Progress Notes (Signed)
Subjective:    Patient ID: Theresa Huffman, female    DOB: [birth year], 72 y.o.   MRN: [id number]  HPI: [name] is a 72year old female who returns for follow up appointmentfor chronic pain and medication refill. She states her pain is located in her left shoulder, lower back and right knee. She rates her pain 7.. Her current exercise regime is walking short distances using her loftstrand crutches in her home and  performing chair exercises daily.  Theresa Huffman reports she had a Left Shoulder Cortisone Injection at her [place] office ( Dr. Cay Schillings) on 10/06/2017, no relief noted. She's scheduled to have Cortisone Injection with Dr. Letta Pate on 01/12/2018.  Ms. Wieland Morphine equivalent is 82.25 MME  Husband in room.  Oral Swab was Performed on 09/13/2017 it was Consistent.   Pain Inventory Average Pain 7 Pain Right Now 7 My pain is burning and aching  In the last 24 hours, has pain interfered with the following? General activity 8 Relation with others 7 Enjoyment of life 8 What TIME of day is your pain at its worst? morning and daytime Sleep (in general) Fair  Pain is worse with: walking, bending and standing Pain improves with: rest and medication Relief from Meds: 6  Mobility ability to climb steps?  no do you drive?  yes use a wheelchair needs help with transfers  Function disabled: date disabled . I need assistance with the following:  meal prep, household duties and shopping  Neuro/Psych weakness numbness trouble walking depression  Prior Studies Any changes since last visit?  no  Physicians involved in your care Any changes since last visit?  no   Family History  Problem Relation Age of Onset  . Cancer Mother        lung and colon  . Stroke Sister    Social History   Socioeconomic History  . Marital status: Married    Spouse name: None  . Number of children: 1  . Years of education: None  . Highest education level:  None  Social Needs  . Financial resource strain: None  . Food insecurity - worry: None  . Food insecurity - inability: None  . Transportation needs - medical: None  . Transportation needs - non-medical: None  Occupational History  . Occupation: TEFL teacher: UNEMPLOYED  Tobacco Use  . Smoking status: Never Smoker  . Smokeless tobacco: Never Used  Substance and Sexual Activity  . Alcohol use: No    Alcohol/week: 0.0 oz  . Drug use: No  . Sexual activity: None  Other Topics Concern  . None  Social History Narrative   Lives in [place], one adult child      Caffeine use: coffee daily in am   Past Surgical History:  Procedure Laterality Date  . CARDIAC CATHETERIZATION N/A 06/02/2016   Procedure: Left Heart Cath and Coronary Angiography;  Surgeon: [name], MD;  Location: [facility];  Service: Cardiovascular;  Laterality: N/A;  . CHOLECYSTECTOMY    . TUBAL LIGATION     Past Medical History:  Diagnosis Date  . Chronic respiratory failure with hypoxia (HCC)    a. followed by pulmonary  . Coronary artery disease, non-occlusive    a. Lexiscan 05/12/16: smal defect of moderate severity in the mid anterior and apical location partially reversible at rest. EF 42%. TID ratio uincreased at 1.23 possibly c/w balanced ischemia. Frequent PVCs; b. LHC 05/2016: near nl with the exception of pRCA  20%, EF 55%  . Diabetes mellitus type 2 in obese ([place])   . Diastolic dysfunction    a. TTE 04/2016: EF 55-60%, nl WM, Gr1DD, nl RV sys fxn, PASP nl  . Essential hypertension   . Facet syndrome, lumbar   . GERD (gastroesophageal reflux disease)   . Hyperlipidemia   . Hypothyroidism   . Lumbosacral spondylosis without myelopathy   . Lymphedema   . Morbid obesity ([place])   . Primary localized osteoarthrosis, lower leg   . Sleep apnea   . SVT (supraventricular tachycardia) ([place])    a. 08/2017: 7-day Zio monitor that showed an overal sinus rhythm with 2 episodes of SVT, the  longest lasting 5 beats at 118 bpm. Rare PACs/PVCs were noted   BP 136/79   Pulse 96   SpO2 91%   Opioid Risk Score:  0 Fall Risk Score:  `1  Depression screen PHQ 2/9  Depression screen [facility] 2/9 11/16/2017 08/11/2017 07/17/2017 06/14/2017 04/17/2017 01/24/2017 09/26/2016  Decreased Interest 1 1 1 1 1  0 0  Down, Depressed, Hopeless 1 1 1 1 1  0 3  PHQ - 2 Score 2 2 2 2 2  0 3  Altered sleeping - - - - - - 0  Tired, decreased energy - - - - - - 3  Change in appetite - - - - - - 3  Feeling bad or failure about yourself  - - - - - - 2  Trouble concentrating - - - - - - 0  Moving slowly or fidgety/restless - - - - - - 3  Suicidal thoughts - - - - - - 0  PHQ-9 Score - - - - - - 14  Difficult doing work/chores - - - - - - Not difficult at all  Some recent data might be hidden    Review of Systems  HENT: Negative.   Eyes: Negative.   Respiratory: Negative.   Cardiovascular: Negative.   Gastrointestinal: Negative.   Endocrine: Negative.   Genitourinary: Negative.   Musculoskeletal: Positive for gait problem.  Skin: Negative.   Allergic/Immunologic: Negative.   Neurological: Positive for weakness and numbness.  Hematological: Negative.   Psychiatric/Behavioral: Positive for dysphoric mood.  All other systems reviewed and are negative.      Objective:   Physical Exam  Constitutional: She is oriented to person, place, and time. She appears well-developed and well-nourished.  HENT:  Head: Normocephalic and atraumatic.  Neck: Normal range of motion. Neck supple.  Cardiovascular: Normal rate and regular rhythm.  Pulmonary/Chest: [place] normal and breath sounds normal.  Continuous Oxygen 2 Liters Nasal Cannula  Musculoskeletal:  Normal Muscle Bulk and Muscle testing Reveals: Upper Extremities: Full  ROM and Muscle Strength 5/5 Bilateral   AC Joint Tenderness: L>R Thoracic Paraspinal Tenderness: T-1-3 T-7-T-9 Lumbar Paraspinal Tenderness: L-4-L-5 Lower Extremities: Decreased ROM  and Muscle Strength 5/5 Right Lower Extremity Flexion Produces Pain into Patella Arrived in Motorized Wheelchair    Neurological: She is alert and oriented to person, place, and time.  Skin: Skin is warm and dry.  Psychiatric: She has a normal mood and affect.  Nursing note and vitals reviewed.         Assessment & Plan:  1. Lumbar pain chronic, no radiculopathy:Lumbar degenerative disc L4-5 and L5-S1. Continue Gabapentin: 11/16/2017 : Refilled: HYDRcodone 7.5/325mg  one tablet TID #90.  MS CONTIN 30 mg one every 12 hours #60.  We will continue the opioid monitoring program,this consists of regular clinic visits, examinations, urine drug screen,  pill counts as well as use of [place] Controlled Substance reporting System. 2. Bilateral knee osteoarthritis: S/P Right Knee Injection on 03/09/2017 with relief noted. Continue with Exercise and Heat therapy. Continue current medication regime. 11/16/2017 3. Morbid Obesity: Continue with Diabetic Diet Regime and Exercise Regimen. 11/16/2017 4. Muscle Spasms: Continue Baclofen. 11/16/2017 5. Left Shoulder Impingement: Scheduled for Cortisone Injection with Dr. Letta Pate.   20 minutes of face to face patient care time was spent during this visit. All questions were encouraged and answered.  F/U in 1 month

## 2017-12-01 DIAGNOSIS — 419620001 Death: Secondary | SNOMED CT | POA: Diagnosis not present

## 2017-12-01 DEATH — deceased

## 2017-12-05 ENCOUNTER — Ambulatory Visit: Payer: Medicare Other | Admitting: Internal Medicine

## 2017-12-15 ENCOUNTER — Ambulatory Visit: Payer: Medicare Other | Admitting: Registered Nurse

## 2017-12-15 ENCOUNTER — Ambulatory Visit: Payer: Medicare Other | Admitting: Physical Medicine & Rehabilitation

## 2018-01-12 ENCOUNTER — Ambulatory Visit: Payer: Medicare Other | Admitting: Physical Medicine & Rehabilitation

## 2018-01-12 ENCOUNTER — Ambulatory Visit: Payer: Medicare Other

## 2018-03-26 IMAGING — CT CT ANGIO CHEST
1 of 2 series · 18 of 32 positions shown · IV contrast (isovue)
Comparison: None.

CLINICAL DATA: Chest pain and shortness of breath

EXAM:
CT ANGIOGRAPHY CHEST WITH CONTRAST
TECHNIQUE: Multidetector CT imaging of the chest was performed using the
standard protocol during bolus administration of intravenous
contrast. Multiplanar CT image reconstructions and MIPs were
obtained to evaluate the vascular anatomy.
CONTRAST:  125 mL Isovue 370 nonionic

[Series 8: thins · axial · 0.69mm/px · z∈[-670,-412]mm · 18 of 374 slices shown]
[im 15/374  lung]
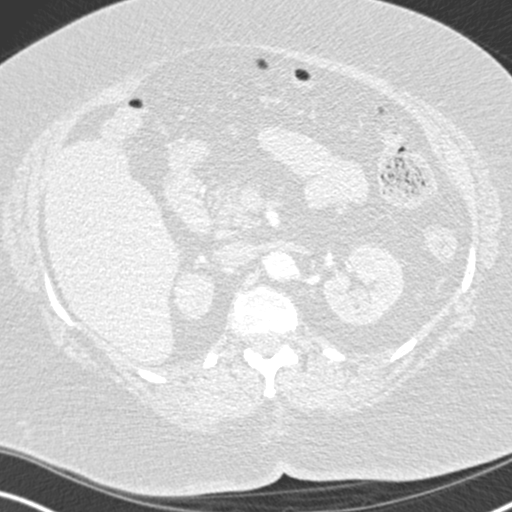
[im 45/374  soft-tissue]
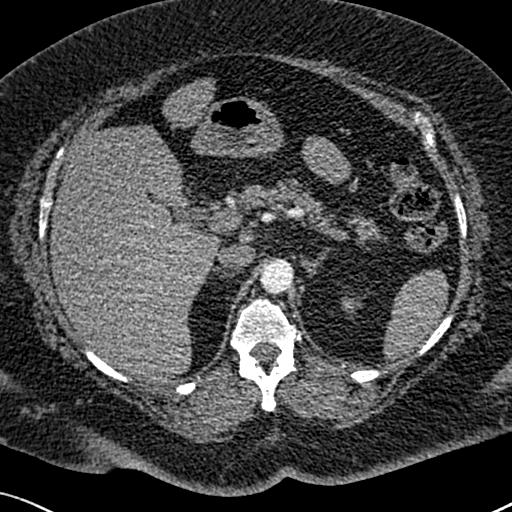
[im 60/374  lung]
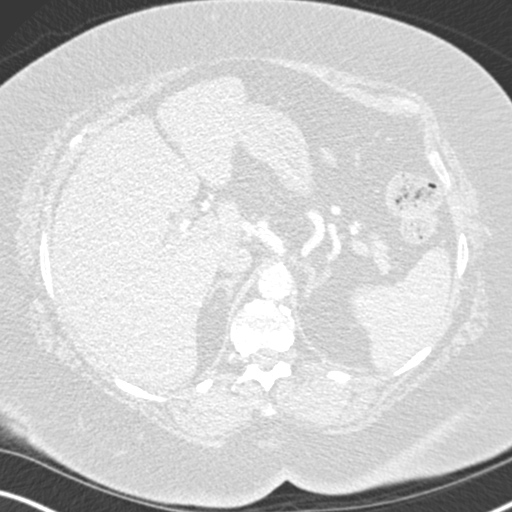
[im 75/374  soft-tissue]
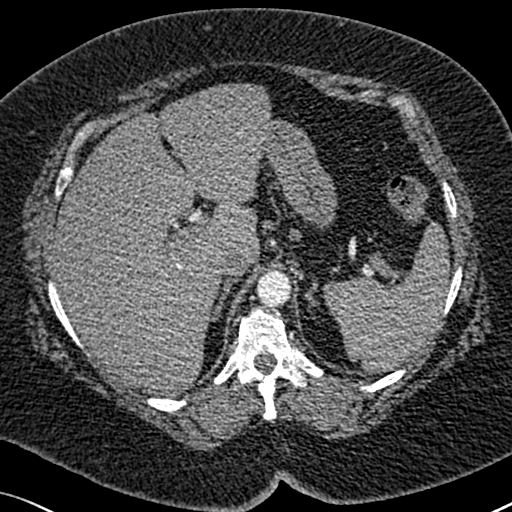
[im 105/374  lung]
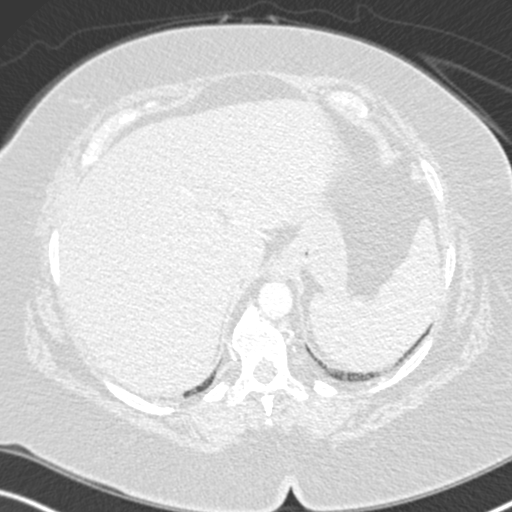
[im 120/374  soft-tissue]
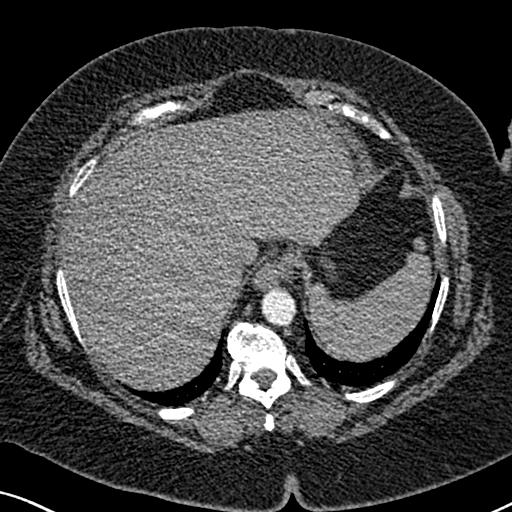
[im 135/374  lung]
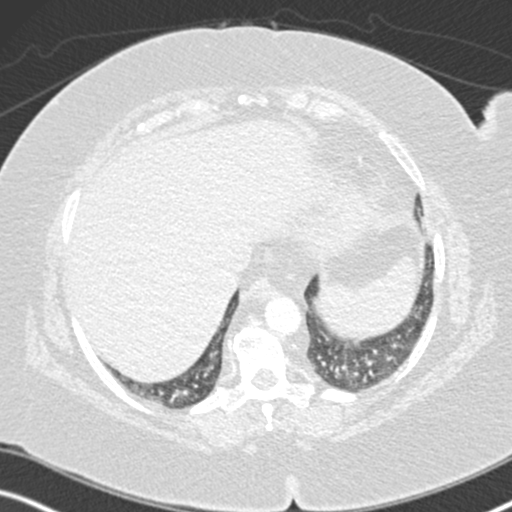
[im 165/374  soft-tissue]
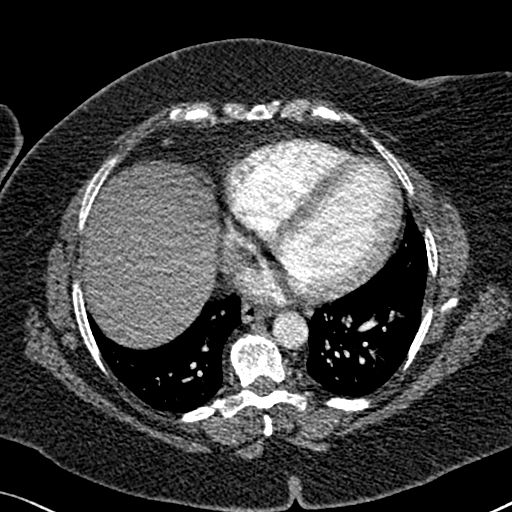
[im 180/374  lung]
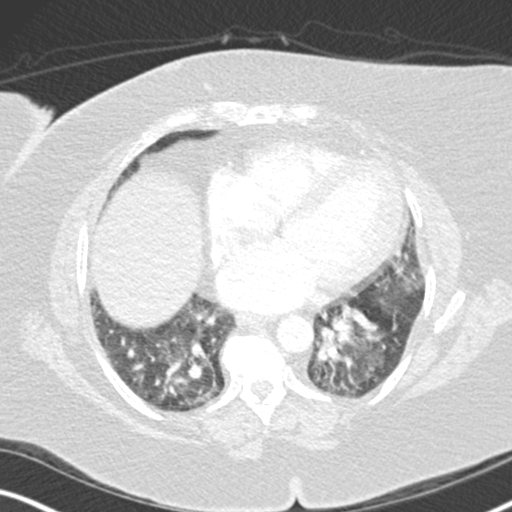
[im 194/374  soft-tissue]
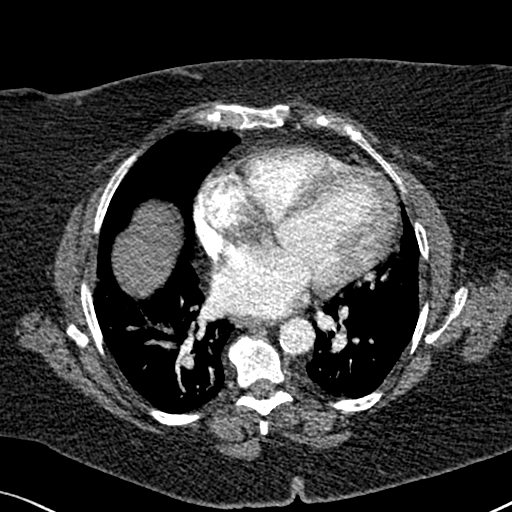
[im 224/374  lung]
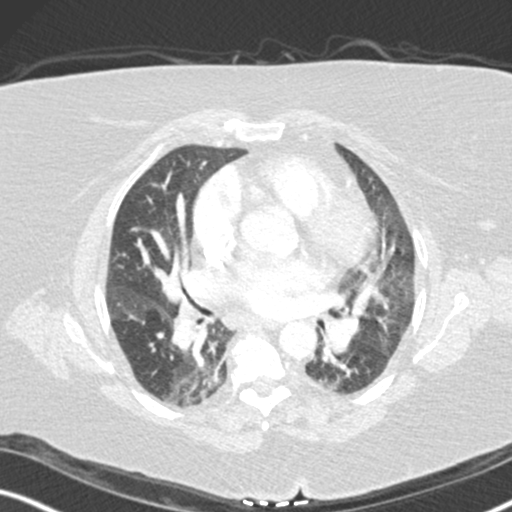
[im 239/374  soft-tissue]
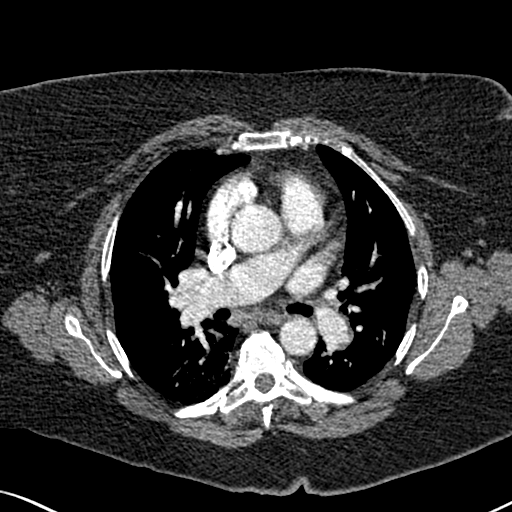
[im 254/374  lung]
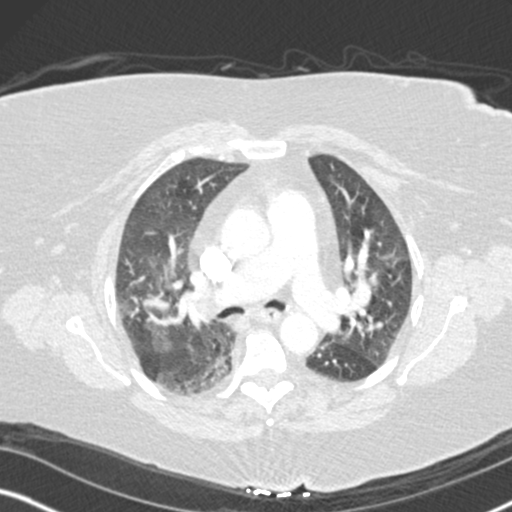
[im 284/374  soft-tissue]
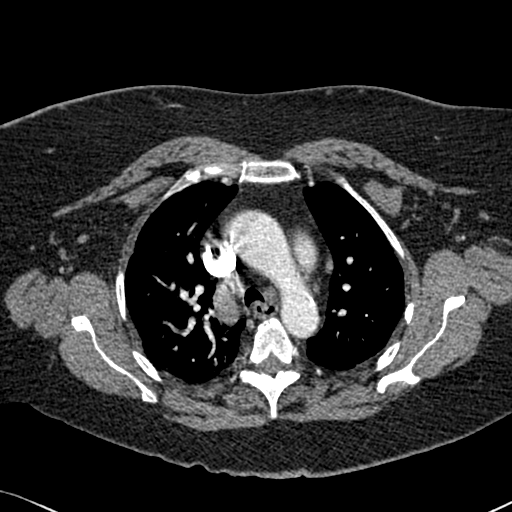
[im 299/374  lung]
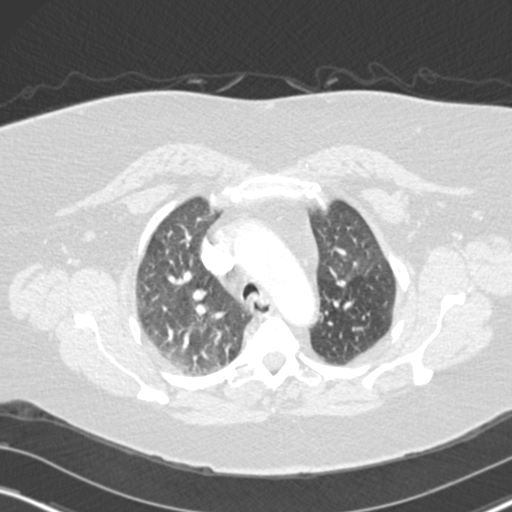
[im 314/374  soft-tissue]
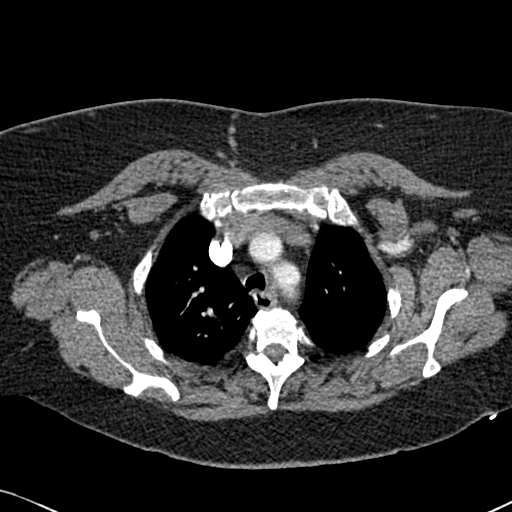
[im 344/374  lung]
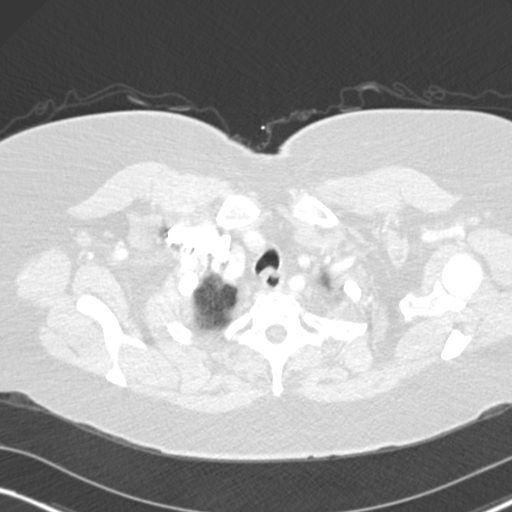
[im 359/374  soft-tissue]
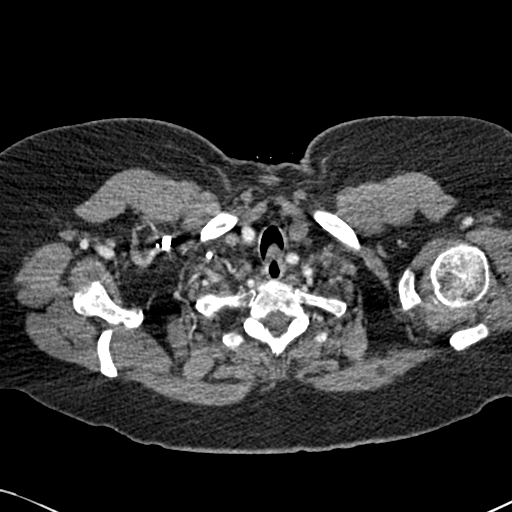

[18 of 32 positions shown; findings below may reference images not displayed]

FINDINGS: Cardiovascular: There is no demonstrable pulmonary embolus. There is
no apparent thoracic aortic aneurysm or dissection. There are
scattered foci of atherosclerotic calcification in the aorta. The
visualized great vessels appear unremarkable. The left and right
common carotid arteries arise as a common trunk, an anatomic
variant. There are scattered foci of coronary artery calcification.
Pericardium is not appreciably thickened.

Mediastinum/Nodes: Visualized thyroid appears unremarkable. There is
a focal lymph node in the aortopulmonary window region measuring
x 1.0 cm. There is a lymph node to the left of the carina measuring
1.5 x 0.9 cm. There is a 1.1 x 1.0 cm right hilar lymph node. There
are a few scattered smaller lymph nodes in the mediastinum as well.
There is a small hiatal hernia.

Lungs/Pleura: There are scattered foci of atelectatic change
bilaterally, slightly more in the right base than elsewhere. There
is no frank edema or consolidation.

Upper Abdomen: In the visualized upper abdomen, there is
atherosclerotic calcification in the upper abdominal aorta and at
the origins of the celiac and superior mesenteric arteries. There is
only slight narrowing at the origins of these vessels. The
visualized upper abdominal structures otherwise appear unremarkable.

Musculoskeletal: There is degenerative change in the thoracic spine.
There are no blastic or lytic bone lesions.

Review of the MIP images confirms the above findings.
IMPRESSION: No demonstrable pulmonary embolus. Areas of atherosclerotic
calcification including foci of coronary artery calcification.
Patchy atelectasis is noted in the lungs bilaterally. No edema or
consolidation is evident. There are several borderline prominent
lymph nodes, likely of reactive etiology. There is a small hiatal
type hernia.
